# Patient Record
Sex: Male | Born: 1960 | Race: White | Hispanic: No | Marital: Married | Smoking: Former smoker
Health system: Southern US, Community
[De-identification: ages and names within clinical notes are randomized; demographics above are authoritative.]

## PROBLEM LIST (undated history)

## (undated) ENCOUNTER — Encounter

## (undated) ENCOUNTER — Ambulatory Visit

## (undated) ENCOUNTER — Ambulatory Visit: Payer: PRIVATE HEALTH INSURANCE | Attending: Rheumatology | Primary: Rheumatology

## (undated) ENCOUNTER — Telehealth

## (undated) ENCOUNTER — Ambulatory Visit: Payer: BLUE CROSS/BLUE SHIELD

## (undated) ENCOUNTER — Ambulatory Visit: Payer: PRIVATE HEALTH INSURANCE | Attending: Surgery | Primary: Surgery

## (undated) ENCOUNTER — Encounter: Attending: Infectious Disease | Primary: Infectious Disease

## (undated) ENCOUNTER — Ambulatory Visit: Payer: PRIVATE HEALTH INSURANCE

## (undated) ENCOUNTER — Encounter: Attending: Rheumatology | Primary: Rheumatology

## (undated) ENCOUNTER — Encounter
Attending: Student in an Organized Health Care Education/Training Program | Primary: Student in an Organized Health Care Education/Training Program

## (undated) ENCOUNTER — Ambulatory Visit
Attending: Student in an Organized Health Care Education/Training Program | Primary: Student in an Organized Health Care Education/Training Program

## (undated) ENCOUNTER — Telehealth: Attending: Rheumatology | Primary: Rheumatology

## (undated) ENCOUNTER — Non-Acute Institutional Stay: Payer: PRIVATE HEALTH INSURANCE

## (undated) DIAGNOSIS — R51 Headache: Secondary | ICD-10-CM

## (undated) DIAGNOSIS — IMO0002 Reserved for concepts with insufficient information to code with codable children: Secondary | ICD-10-CM

## (undated) DIAGNOSIS — M1990 Unspecified osteoarthritis, unspecified site: Secondary | ICD-10-CM

## (undated) DIAGNOSIS — K219 Gastro-esophageal reflux disease without esophagitis: Secondary | ICD-10-CM

## (undated) DIAGNOSIS — R519 Headache, unspecified: Secondary | ICD-10-CM

## (undated) DIAGNOSIS — J449 Chronic obstructive pulmonary disease, unspecified: Secondary | ICD-10-CM

## (undated) DIAGNOSIS — M797 Fibromyalgia: Secondary | ICD-10-CM

## (undated) HISTORY — PX: SPINE SURGERY: SHX786

## (undated) HISTORY — DX: Chronic obstructive pulmonary disease, unspecified: J44.9

## (undated) HISTORY — DX: Reserved for concepts with insufficient information to code with codable children: IMO0002

## (undated) MED ORDER — OMEPRAZOLE 10 MG CAPSULE,DELAYED RELEASE
Freq: Every day | ORAL | 0 days | Status: SS
Start: ? — End: 2020-06-30

## (undated) MED ORDER — OMEPRAZOLE MAGNESIUM 20 MG TABLET,DELAYED RELEASE: Freq: Every day | ORAL | 0 days | Status: SS

## (undated) MED ORDER — MULTIVITAMIN TABLET: Freq: Every day | ORAL | 0 days | Status: SS

## (undated) MED ORDER — PREGABALIN 100 MG CAPSULE: Freq: Two times a day (BID) | ORAL | 0 days | Status: SS

## (undated) MED ORDER — PREGABALIN 225 MG CAPSULE: Freq: Two times a day (BID) | ORAL | 0.00000 days | Status: SS

---
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
## 1999-02-12 HISTORY — PX: LUNG SURGERY: SHX703

## 2000-05-14 ENCOUNTER — Encounter: Payer: Self-pay | Admitting: Thoracic Surgery (Cardiothoracic Vascular Surgery)

## 2000-05-14 ENCOUNTER — Encounter (INDEPENDENT_AMBULATORY_CARE_PROVIDER_SITE_OTHER): Payer: Self-pay | Admitting: *Deleted

## 2000-05-14 ENCOUNTER — Inpatient Hospital Stay (HOSPITAL_COMMUNITY)
Admission: AD | Admit: 2000-05-14 | Discharge: 2000-05-18 | Payer: Self-pay | Admitting: Thoracic Surgery (Cardiothoracic Vascular Surgery)

## 2000-05-15 ENCOUNTER — Encounter: Payer: Self-pay | Admitting: Thoracic Surgery (Cardiothoracic Vascular Surgery)

## 2000-05-16 ENCOUNTER — Encounter: Payer: Self-pay | Admitting: Thoracic Surgery (Cardiothoracic Vascular Surgery)

## 2000-05-17 ENCOUNTER — Encounter: Payer: Self-pay | Admitting: Thoracic Surgery (Cardiothoracic Vascular Surgery)

## 2000-05-18 ENCOUNTER — Encounter: Payer: Self-pay | Admitting: Thoracic Surgery (Cardiothoracic Vascular Surgery)

## 2000-05-26 ENCOUNTER — Encounter
Admission: RE | Admit: 2000-05-26 | Discharge: 2000-05-26 | Payer: Self-pay | Admitting: Thoracic Surgery (Cardiothoracic Vascular Surgery)

## 2000-05-26 ENCOUNTER — Encounter: Payer: Self-pay | Admitting: Thoracic Surgery (Cardiothoracic Vascular Surgery)

## 2000-07-14 ENCOUNTER — Encounter: Payer: Self-pay | Admitting: Thoracic Surgery (Cardiothoracic Vascular Surgery)

## 2000-07-14 ENCOUNTER — Encounter
Admission: RE | Admit: 2000-07-14 | Discharge: 2000-07-14 | Payer: Self-pay | Admitting: Thoracic Surgery (Cardiothoracic Vascular Surgery)

## 2000-09-05 ENCOUNTER — Observation Stay (HOSPITAL_COMMUNITY): Admission: EM | Admit: 2000-09-05 | Discharge: 2000-09-06 | Payer: Self-pay | Admitting: Emergency Medicine

## 2000-09-05 ENCOUNTER — Encounter: Payer: Self-pay | Admitting: Emergency Medicine

## 2000-12-29 ENCOUNTER — Encounter
Admission: RE | Admit: 2000-12-29 | Discharge: 2000-12-29 | Payer: Self-pay | Admitting: Thoracic Surgery (Cardiothoracic Vascular Surgery)

## 2000-12-29 ENCOUNTER — Encounter: Payer: Self-pay | Admitting: Thoracic Surgery (Cardiothoracic Vascular Surgery)

## 2001-01-19 ENCOUNTER — Encounter
Admission: RE | Admit: 2001-01-19 | Discharge: 2001-01-19 | Payer: Self-pay | Admitting: Thoracic Surgery (Cardiothoracic Vascular Surgery)

## 2001-01-19 ENCOUNTER — Encounter: Payer: Self-pay | Admitting: Thoracic Surgery (Cardiothoracic Vascular Surgery)

## 2001-07-07 ENCOUNTER — Emergency Department (HOSPITAL_COMMUNITY): Admission: EM | Admit: 2001-07-07 | Discharge: 2001-07-07 | Payer: Self-pay | Admitting: Emergency Medicine

## 2001-07-07 ENCOUNTER — Encounter: Payer: Self-pay | Admitting: Emergency Medicine

## 2003-05-11 ENCOUNTER — Encounter
Admission: RE | Admit: 2003-05-11 | Discharge: 2003-08-09 | Payer: Self-pay | Admitting: Physical Medicine & Rehabilitation

## 2003-09-05 ENCOUNTER — Encounter
Admission: RE | Admit: 2003-09-05 | Discharge: 2003-12-04 | Payer: Self-pay | Admitting: Physical Medicine & Rehabilitation

## 2003-12-20 ENCOUNTER — Encounter
Admission: RE | Admit: 2003-12-20 | Discharge: 2004-03-19 | Payer: Self-pay | Admitting: Physical Medicine & Rehabilitation

## 2003-12-21 ENCOUNTER — Ambulatory Visit: Payer: Self-pay | Admitting: Physical Medicine & Rehabilitation

## 2004-04-18 ENCOUNTER — Encounter
Admission: RE | Admit: 2004-04-18 | Discharge: 2004-07-17 | Payer: Self-pay | Admitting: Physical Medicine & Rehabilitation

## 2004-04-19 ENCOUNTER — Ambulatory Visit: Payer: Self-pay | Admitting: Physical Medicine & Rehabilitation

## 2004-06-19 ENCOUNTER — Ambulatory Visit: Payer: Self-pay | Admitting: Family Medicine

## 2004-07-11 ENCOUNTER — Ambulatory Visit: Payer: Self-pay | Admitting: Family Medicine

## 2004-08-13 ENCOUNTER — Encounter
Admission: RE | Admit: 2004-08-13 | Discharge: 2004-11-11 | Payer: Self-pay | Admitting: Physical Medicine & Rehabilitation

## 2004-08-13 ENCOUNTER — Ambulatory Visit: Payer: Self-pay | Admitting: Physical Medicine & Rehabilitation

## 2004-08-17 ENCOUNTER — Ambulatory Visit (HOSPITAL_COMMUNITY)
Admission: RE | Admit: 2004-08-17 | Discharge: 2004-08-17 | Payer: Self-pay | Admitting: Physical Medicine & Rehabilitation

## 2004-09-19 ENCOUNTER — Ambulatory Visit: Payer: Self-pay | Admitting: Physical Medicine & Rehabilitation

## 2004-10-17 ENCOUNTER — Ambulatory Visit: Payer: Self-pay

## 2004-12-19 ENCOUNTER — Ambulatory Visit: Payer: Self-pay | Admitting: Physical Medicine & Rehabilitation

## 2004-12-19 ENCOUNTER — Encounter
Admission: RE | Admit: 2004-12-19 | Discharge: 2005-03-19 | Payer: Self-pay | Admitting: Physical Medicine & Rehabilitation

## 2005-03-20 ENCOUNTER — Ambulatory Visit: Payer: Self-pay | Admitting: Physical Medicine & Rehabilitation

## 2005-03-20 ENCOUNTER — Encounter
Admission: RE | Admit: 2005-03-20 | Discharge: 2005-06-18 | Payer: Self-pay | Admitting: Physical Medicine & Rehabilitation

## 2005-06-12 ENCOUNTER — Ambulatory Visit: Payer: Self-pay | Admitting: Physical Medicine & Rehabilitation

## 2005-06-12 ENCOUNTER — Encounter
Admission: RE | Admit: 2005-06-12 | Discharge: 2005-09-10 | Payer: Self-pay | Admitting: Physical Medicine & Rehabilitation

## 2005-11-13 ENCOUNTER — Encounter
Admission: RE | Admit: 2005-11-13 | Discharge: 2006-02-11 | Payer: Self-pay | Admitting: Physical Medicine & Rehabilitation

## 2005-11-13 ENCOUNTER — Ambulatory Visit: Payer: Self-pay | Admitting: Physical Medicine & Rehabilitation

## 2006-04-16 ENCOUNTER — Ambulatory Visit: Payer: Self-pay | Admitting: Physical Medicine & Rehabilitation

## 2006-04-16 ENCOUNTER — Encounter
Admission: RE | Admit: 2006-04-16 | Discharge: 2006-07-15 | Payer: Self-pay | Admitting: Physical Medicine & Rehabilitation

## 2006-10-08 ENCOUNTER — Ambulatory Visit: Payer: Self-pay | Admitting: Physical Medicine & Rehabilitation

## 2006-10-08 ENCOUNTER — Encounter
Admission: RE | Admit: 2006-10-08 | Discharge: 2006-10-09 | Payer: Self-pay | Admitting: Physical Medicine & Rehabilitation

## 2007-03-04 ENCOUNTER — Ambulatory Visit: Payer: Self-pay | Admitting: Physical Medicine & Rehabilitation

## 2007-03-04 ENCOUNTER — Encounter
Admission: RE | Admit: 2007-03-04 | Discharge: 2007-03-05 | Payer: Self-pay | Admitting: Physical Medicine & Rehabilitation

## 2007-07-28 ENCOUNTER — Encounter
Admission: RE | Admit: 2007-07-28 | Discharge: 2007-07-30 | Payer: Self-pay | Admitting: Physical Medicine & Rehabilitation

## 2007-07-30 ENCOUNTER — Ambulatory Visit: Payer: Self-pay | Admitting: Physical Medicine & Rehabilitation

## 2008-01-19 ENCOUNTER — Encounter
Admission: RE | Admit: 2008-01-19 | Discharge: 2008-01-21 | Payer: Self-pay | Admitting: Physical Medicine & Rehabilitation

## 2008-01-21 ENCOUNTER — Ambulatory Visit: Payer: Self-pay | Admitting: Physical Medicine & Rehabilitation

## 2008-06-14 ENCOUNTER — Encounter
Admission: RE | Admit: 2008-06-14 | Discharge: 2008-06-16 | Payer: Self-pay | Admitting: Physical Medicine & Rehabilitation

## 2008-06-16 ENCOUNTER — Ambulatory Visit: Payer: Self-pay | Admitting: Physical Medicine & Rehabilitation

## 2010-03-04 ENCOUNTER — Encounter: Payer: Self-pay | Admitting: Thoracic Surgery (Cardiothoracic Vascular Surgery)

## 2010-06-26 NOTE — Assessment & Plan Note (Signed)
[name] returns to the clinic today for follow up evaluation.  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  He does use his hydrocodone  approximately 3 times per day and needs a refill in the office today.   MEDICATIONS:  Hydrocodone 7.5/750 one tablet t.i.d. p.r.n. (2-3 per day)   REVIEW OF SYSTEMS:  Noncontributory.   PHYSICAL EXAMINATION:  GENERAL:  Well-appearing, fit adult male in mild  acute discomfort.  VITAL SIGNS:  Blood pressure 138/87 with a pulse 60, respiratory rate 18  and O2 saturation 99% on room air.  MUSCULOSKELETAL:  He has 5/5 strength throughout.  Bulk and tone were  normal.   IMPRESSION:  1. Chronic low back pain with lumbar spondylosis with prior      laminectomy in 1996.  2. Mild cervical spondylosis.  3. History of peptic ulcer disease.  4. History of lung nodule, stable.   In the office today we did refill the patient's hydrocodone as of  11/04/2006.  We will plan on seeing him in follow up in approximately 5-  6 months time with refills prior to that appointment as necessary.           ______________________________  Steven Whitney, M.D.     DC/MedQ  D:  10/09/2006 09:22:53  T:  10/09/2006 14:22:25  Job #:  [id number]

## 2010-06-26 NOTE — Assessment & Plan Note (Signed)
Steven Whitney returns to clinic today for followup evaluation.  He is  doing fairly well overall.  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  He  continues to use the hydrocodone approximately 2-3 tablets per day and  does need a refill over the next few days.   MEDICATIONS:  1. Hydrocodone 7.5/750 one tablet t.i.d. p.r.n. (2-3 per day)  2. Testosterone injection monthly.   REVIEW OF SYSTEMS:  Noncontributory.   PHYSICAL EXAMINATION:  GENERAL:  Well-appearing fit adult male in mild  to no acute discomfort.  VITAL SIGNS:  Blood pressure 127/82 with a pulse of 89, respiratory rate  18, and O2 saturation 98% on room air.  He ambulates without any assistive device.  Lumbar range of motion was  slightly decreased in all planes.   IMPRESSION:  1. Chronic low back pain with lumbar spondylosis with prior      laminectomy in 1996.  2. Mild cervical spondylosis.  3. History of peptic ulcer disease.  4. History of lung nodule, stable.   In the office today, we did refill the patient's hydrocodone a total of  90 as of August 19, 2007, with 2 refills.  He is very compliant with  restrictions and follows all instructions.  We will plan on seeing him  in follow up in approximately 4-6 months time with refills prior to that  appointment if necessary.           ______________________________  Jarvis Morgan, M.D.     DC/MedQ  D:  07/30/2007 09:54:51  T:  07/30/2007 21:32:16  Job #:  [id number]
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
## 2010-06-26 NOTE — Assessment & Plan Note (Signed)
Mr. Coles returns to clinic today for followup evaluation.  He  reports that he is using his hydrocodone approximately 2-3 tablets per  day.  He does need to refill over the next day or so.  He is getting  reasonable relief overall and that is his only medicine that he takes on  a regular basis.  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   MEDICATIONS:  Hydrocodone 7.5/750 one tablet t.i.d. p.r.n. (2-3 per  day).   REVIEW OF SYSTEMS:  Noncontributory.   PHYSICAL EXAMINATION:  GENERAL:  Well-appearing, fit adult male in mild-  to-no acute discomfort.  VITAL SIGNS:  Blood pressure 133/80 with pulse 71, respiratory rate 18,  and O2 saturation 98% on room air.  MUSCULOSKELETAL:  The patient ambulates without any assistive device.  Lumbar range of motion was decreased in all planes.  Bulk and tone were  normal throughout the bilateral upper and lower extremities and  sensation was intact to light touch.  Cervical range of motion was  decreased mostly in flexion and in extension.   IMPRESSION:  1. Chronic low back pain with lumbar spondylosis and prior laminectomy      in 1996.  2. Mild cervical spondylosis.  3. History of peptic ulcer disease.  4. History of lung nodule, stable.   In the office today, we did refill the patient's hydrocodone as of  January 22, 2008, with 3 refills.  He continues to be very compliant  with the use of the medication without signs of diversion or significant  side effects.  He continues to get good analgesic effect overall.  We  will plan on seeing the patient in followup in approximately 4-6 months'  time with refills prior to that appointment as necessary.           ______________________________  Jarvis Morgan, M.D.     DC/MedQ  D:  01/21/2008 10:05:55  T:  01/22/2008 01:15:27  Job #:  [id number]

## 2010-06-26 NOTE — Assessment & Plan Note (Signed)
[name] returns today for follow-up evaluation.  He reports that  overall he is doing well.  He continues to use the hydrocodone  approximately 2-3 per day.  He does need a refill on those in the office  today.  We generally give him 3 refills to make it easier on him with  his work schedule.   MEDICATIONS:  Hydrocodone 7.5/750 mg one tablet t.i.d. p.r.n.   REVIEW OF SYSTEMS:  Noncontributory.   PHYSICAL EXAM:  A well-appearing, fit adult male in mild acute  discomfort.  Blood pressure 138/91 with a pulse of 87, respiratory rate 18, and O2  saturation 98% on room air.  He has 5/5 strength throughout.  He ambulates without any assistive  device.   IMPRESSION:  1. Chronic low back pain with lumbar spondylosis with prior      laminectomy in 1996.  2. Mild cervical spondylosis.  3. History of peptic ulcer disease.  4. History of lung nodule, stable.   In the office today we did refill the patient's hydrocodone as of  March 10, 2007, with 3 refills.  We will plan on seeing the patient in  follow-up in approximately 4-5 months' time with refills prior to that  appointment as necessary.  He continues to get good analgesic effect  with no signs of diversion and no significant side effects.           ______________________________  Steven Whitney, M.D.     DC/MedQ  D:  03/05/2007 10:57:55  T:  03/05/2007 11:16:13  Job #:  [id number]

## 2010-06-29 NOTE — Assessment & Plan Note (Signed)
[name] returns to clinic today for followup evaluation.  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  That is a part time job for him.  His wife is about to lose her  job and unfortunately he will be without health insurance.  Her job is  apparently going overseas from the [employer].   MEDICATIONS:  Hydrocodone 7.5/750 1 tablet t.i.d. p.r.n.   REVIEW OF SYSTEMS:  Noncontributory.   PHYSICAL EXAMINATION:  GENERAL:  A well-appearing fit adult male in mild  to no acute discomfort.  VITAL SIGNS:  Blood pressure 129/75 with a pulse of 81, respiratory rate  16, O2 saturation 97% on room air.  EXTREMITIES:  He has 5/5 strength throughout.  Bulk and tone were  normal.   IMPRESSION:  1. Chronic low back pain with lumbar spondylosis with prior      laminectomy in 1996.  2. Mild cervical spondylosis.  3. History of peptic ulcer disease.  4. History of lung nodule, stable.   In the office today, we did refill the patient's hydrocodone, total of  90 with 2 refills.  He continues to be very compliant with prescription  use and has no significant adverse side effects.  Will plan on seeing  him in followup in approximately 5-6 months time with refills prior to  that appointment if necessary.           ______________________________  Steven Whitney, M.D.     DC/MedQ  D:  04/17/2006 09:38:05  T:  04/17/2006 10:04:03  Job #:  [id number]

## 2010-06-29 NOTE — Op Note (Signed)
[facility]  Patient:    Steven Whitney, Steven Whitney                     MRN: [id number] Proc. Date: 05/14/00 Attending:  Valentina Gu. Roxy Manns, M.D. CC:         Dr. Elesa Hacker, [place]  [facility]   Operative Report  PREOPERATIVE DIAGNOSIS:  Recurrent right spontaneous pneumothorax.  POSTOPERATIVE DIAGNOSIS:  Recurrent right spontaneous pneumothorax.  PROCEDURES:  Right video-assisted thoracic surgery (VATS) for apical bleb resection, pleurectomy, and pleurodesis.  SURGEON:  Valentina Gu. Roxy Manns, M.D.  ASSISTANT:  Earnstine Regal, P.A.  ANESTHESIA:  General.  BRIEF CLINICAL NOTE:  Patient is a 50 year old previously healthy white male from [place], who recently presented to [facility] with a right spontaneous pneumothorax.  This was treated with chest tube placement successfully with good re-expansion of the lung.  The patient was discharged home after successful removal of the chest tube.  Several days later, the patient returned with another recurrent right spontaneous pneumothorax.  A chest tube was placed and the lung re-expanded.  The patient was transferred to [facility] for further management.  OPERATIVE CONSENT:  The patient is counseled at length regarding the indications and the potential benefits of videoscopic surgery for recurrent pneumothorax.  Alternative strategies are discussed, including the possibility of continued conservative observation with an attempt at chest tube removal without need for surgery.  The risk of recurrence was discussed as an indication for intervening surgically at this time.  The patient accepts all associated risks of surgery, including but not limited to risk of death, bleeding requiring blood transfusion, arrhythmia, pneumonia, prolonged air leak requiring prolonged chest tube drainage, and prolonged chest wall discomfort.  He accepts these risks  as well as any unforeseen complications and desires to proceed as described.  DESCRIPTION OF PROCEDURE:  The patient is brought to the operating room on the above-mentioned date and placed in the supine position on the operating room table.  Intravenous antibiotics are administered.  A radial arterial line and central venous catheter are placed for monitoring purposes.  Sequential pneumatic compression boots are placed on both lower extremities to prevent deep vein thrombosis.  General endotracheal anesthesia is induced uneventfully, and a dual-lumen endotracheal tube is placed.  The position is verified under the care and direction of David A. Al Corpus, M.D.  The patient is turned to the left lateral decubitus position, and his right chest is prepared and draped in a sterile manner.  The right lung is allowed to deflate using single-lung anesthesia.  A small incision is made in the anterior axillary line overlying approximately the seventh intercostal space.  The intercostal muscles and subcutaneous tissues are divided with electrocautery.  The right pleural space is entered bluntly and carefully.  There appear to be no adhesions between the parietal and pleural surfaces.  The trocar is passed through the incision, and a 30 degree videoscopic camera is passed through the port.  The right chest is explored visually.  The right lung appears normal.  There are no adhesions between the pleural surfaces, and the right lung is completely flat.  Two additional ports are placed, with one located posteriorly through approximately the fifth intercostal space and a third located anteriorly through approximately the fourth intercostal space.  The apex of the right lung is mobilized, and the entire right chest is explored.  No obvious abnormalities in the lung parenchyma are noted, although  there is suggestion of a subpleural bleb near the apex of the right upper lobe.  Wedge resection of the apex of  the right upper lobe is performed using several fires of the endoscopic stapling device with Gore-Tex Seam-Guard covers to secure staple line integrity.  After complete removal of the specimen, the specimen is examined and subsequently sent for routine pathologic evaluation.  The remainder of the right lung is completely examined.  Specifically, the apical portion of the superior segment of the right lower lobe is also examined.  There is no sign of any blebs in this region.  No other resections are performed.  Pleurectomy is performed using blunt dissection to remove the parietal pleura circumferentially around the entire right hemithorax.  This is performed easily and uneventfully.  The parietal surface of the right hemidiaphragm is then abraded using the cautery scratch pad to abrade the surface.  The right chest is inspected for hemostasis.  The right chest is drained with a single 28 French chest tube placed through the original middle portion, _____ inferiorly.  The chest tube is secured to the skin with several sutures.  The videoscopic camera is passed through another port while the right lung is re-expanded.  No obvious leaks or other abnormalities are noted.  The videoscopic camera is removed uneventfully.  The two remaining port incisions are closed in multiple layers.  The chest tube is affixed to a closed-suction drainage device.  The patient tolerated the procedure well, is extubated in the operating room, and transported to the recovery room in stable condition.  There are no intraoperative complications. D:  05/14/00 TD:  05/14/00 Job: [id number]

## 2010-06-29 NOTE — Assessment & Plan Note (Signed)
Mr. Ende returns to clinic today for followup evaluation. He reports  that he is doing well, at least stable in terms of his back pain. He  continues to use his hydrocodone 7.5/750 one half tablet to one tablet  b.i.d.   He has developed a lot of pain of his mouth and that has been treated by a  dentist in [place]. Apparently, they pulled at least two teeth, but  he still has pain. He was subsequently referred to Dr. Erling Cruz, local  neurologist. Dr. Erling Cruz started the patient on Neurontin, and the patient  reports that he is getting relief with that medication. He is due to follow  up with Dr. Erling Cruz in approximately six weeks' time. The patient did not gain  relief from the Neurontin for his back pain in the past, but it is helping  his mouth pain at the present time.   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   MEDICATIONS:  1. Hydrocodone 7.5/750 one half to one tablet p.o. b.i.d.  2. Neurontin 500 mg p.o. b.i.d.   PHYSICAL EXAMINATION:  Well appearing adult male with blood pressure 131/85,  pulse 71, and O2 saturation 99% on room air. He has strength of 5/5  throughout the bilateral upper and lower extremities. Bulk and tone were  normal, and reflexes were 2+ and symmetrical.   IMPRESSION:  1. Chronic low back pain.  2. Lumbar spondylosis with history of prior laminectomy in 1996.  3. Mild cervical spondylosis.  4. Status post thoracoscopy for spontaneous pneumothorax with persistent     pain, resolved.  5. History of peptic ulcer disease per patient.   At the present time, the patient has sufficient hydrocodone. His last refill  was apparently April 25, 2003. We will plan on giving him refills and see  him in followup in approximately four months' time.      Jarvis Morgan, M.D.   DC/MedQ  D:  05/13/2003 11:21:33  T:  05/13/2003  15:30:12  Job #:  [id number]

## 2010-06-29 NOTE — Assessment & Plan Note (Signed)
MEDICAL RECORD NUMBER:  [id number].   Mr. Trageser returns to clinic today for followup evaluation. He is having  some increased pain especially with colder weather. Overall, he gets good  relief from his hydrocodone used three times per day. He does need a refill  on that in the office today. He has tried the Flexeril but reports that he  did not get much relief, and he felt too sleep with the medication. He is  wondering if any other muscle relaxing medications are available. He still  needs to have his followup CAT scan of his chest to evaluate his lung  nodule, and that is probably due at this time as he reports he was told to  have one every three to six months.   MEDICATIONS:  Hydrocodone 7.5/750 one tablet t.i.d. p.r.n.   REVIEW OF SYSTEMS:  Noncontributory.   PHYSICAL EXAMINATION:  GENERAL:  Well-appearing fit adult male in mild to no  acute discomfort.  VITAL SIGNS:  Blood pressure 133/80 with pulse of 97, respiratory rate 16  and O2 saturation 99% on room air.   He has 5-/5 strength throughout the bilateral upper and lower extremities.  Bulk and tone were normal, and reflexes were 2+ and symmetrical.   IMPRESSION:  1.  Chronic low back pain.  2.  Lumbar spondylosis with prior laminectomy in 1996.  3.  Mild cervical spondylosis.  4.  History of peptic ulcer disease.  5.  History of lung nodule, stable.   In the office today, we did give the patient prescription for the  hydrocodone 7.5/750 one tablet t.i.d. p.r.n. with one refill. We also gave  him a new prescription for Soma at 350 mg 1 tablet p.o. b.i.d., a total of  only 20 so that he can try that out. If that gives him relief, then he call  in for a refill. He will be stopping the Flexeril medication completely and  will not refill that for him.   We will plan on seeing him in followup in approximately three to four  months' time.           ______________________________  Jarvis Morgan, M.D.     DC/MedQ  D:  03/21/2005 09:53:19  T:  03/21/2005 10:34:34  Job #:  [id number]

## 2010-06-29 NOTE — Assessment & Plan Note (Signed)
Mr. Pirro returns today for followup evaluation. He reports that overall  he is doing reasonably well.  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  He is using hydrocodone  approximately 3 times per day.  He has restarted Flexeril medication which  he had used previously and that had helped him with his sleep.   MEDICATIONS:  1. Hydrocodone 7.5/750 one tablet t.i.d. as needed.  2. Ciprofloxacin periodically for urinary tract infection.   REVIEW OF SYSTEMS:  Noncontributory.   PHYSICAL EXAMINATION:  A well-appearing fit adult male in mild to no acute  discomfort.  Blood pressure 123/77 with a pulse of 72.  Respiration 17 and O2 saturation  100% on room air.  He is 5/5 strength in bilateral and upper and lower extremities.  Bulk and  tone were normal.  Reflexes were 2+ and symmetrical.  Sensation was intact  to light touch throughout.   IMPRESSION:  1. Chronic low back pain.  2. Lumbar spondylosis with prior laminectomy in 1996.  3. Mild cervical spondylosis.  4. History of peptic ulcer disease.  5. History of lung nodule, stable.   In the office today we did not need to refill his hydrocodone as that was  just refilled in mid September.  We did give him a new script for Flexeril  10 mg 1 tablet p.o. nightly.  We will plan on seeing him in followup in  approximately 5 months time with refills prior to that appointment as  necessary.           ______________________________  Jarvis Morgan, M.D.     DC/MedQ  D:  11/14/2005 09:27:22  T:  11/15/2005 13:13:04  Job #:  [id number]

## 2010-06-29 NOTE — Assessment & Plan Note (Signed)
MEDICAL RECORD NUMBER:  [id number].   [name] returns to clinic today for followup evaluation. I last saw him  August 16, 2004. He had reported that a CAT scan of his abdomen was done Jun 19, 2004 showed acute diverticulitis or an abscess. There was also some  lucencies in the vertebral bodies and the sacrum. The radiologist could not  exclude the possibility of an underlying process and recommended a bone  scan. No bone scan was ever completed. We did send him for a bone scan when  I saw him July 6, and that was done August 17, 2004. The impression on the bone  scan was totally negative.   The patient is due to follow up with his surgeon, Dr. Roxan Hockey. A  followup CAT scan is scheduled next week.   The patient reports that he gets good relief with hydrocodone 7.5/750 one  tablet t.i.d. p.r.n. He generally uses two per day, but I have encouraged  him to use three as needed.   MEDICATIONS:  1.  Hydrocodone 7.5/750 one tablet t.i.d. p.r.n.  2.  Advair Diskus p.r.n. b.i.d.   PHYSICAL EXAMINATION:  Well-appearing, fit, adult male in mid acute  discomfort. Blood pressure is 132/84 with a pulse of 74, respiratory rate  16, and O2 saturation 99% on room air. He has 5/5 strength throughout the  bilateral upper and lower extremities. Bulk and tone were normal, and  reflexes were 2+ and symmetrical. Sensation was intact to light touch  throughout. He ambulates without assistive device.   IMPRESSION:  1.  Chronic low back pain.  2.  Lumbar spondylosis with prior laminectomy in 1996.  3.  Mild cervical spondylosis.  4.  History of peptic ulcer disease.   In the office today, we did the patient a copy of a bone scan for referral  back to his surgeon. We have also refilled his hydrocodone as of today. I  have encouraged him to use it three times a day as needed. He tends to be  reluctant to use it more than twice a day. We will plan on seeing him in  followup in approximately three months'  time.       DC/MedQ  D:  09/21/2004 09:35:52  T:  09/21/2004 10:13:42  Job #:  [id number]

## 2010-06-29 NOTE — Assessment & Plan Note (Signed)
_____  Steven Whitney returns to the clinic today for follow-up evaluation.  He is  doing reasonably well overall on his hydrocodone used 3 times a day as  needed.  He just had a refill on May 29, 2005.  He reports that he has  been seeing a urologist for an enlarged prostate and is on an antibiotic,  along with UroXatral.  He is experiencing some sleepiness related to the  UroXatral.   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   MEDICATIONS:  1.  Hydrocodone 7.5/750, 1 tablet t.i.d. p.r.n.  2.  UroXatral daily.  3.  Ciprofloxacin daily.   REVIEW OF SYSTEMS:  Noncontributory.   PHYSICAL EXAMINATION:  GENERAL:  Well-appearing, thin adult male in mild to  no acute discomfort.  VITAL SIGNS:  Blood pressure 123/77 with a pulse of 69, respiratory rate 16,  and O2 saturation 99% on room air.  EXTREMITIES:  He has 5-/5 strength throughout the bilateral upper and lower  extremities.  Reflexes are 2+ and symmetrical, and sensation is intact to  light touch throughout.  He ambulates without any assistive device.   IMPRESSION:  1.  Chronic low back pain.  2.  Lumbar spondylosis with prior laminectomy in 1996.  3.  Mild cervical spondylosis.  4.  History of peptic ulcer disease.  5.  History of lung nodule, stable.   In the office today, no refill on medications is necessary.  He is very  compliant with medicines and very stable.  We will plan on seeing him in  followup in approximately 6 months' time with refills prior to that  appointment if necessary.           ______________________________  Steven Whitney, M.D.     DC/MedQ  D:  06/13/2005 09:27:00  T:  06/13/2005 19:25:42  Job #:  [id number]

## 2010-06-29 NOTE — Assessment & Plan Note (Signed)
MEDICAL RECORD NUMBER:  [id number].   Mr. Querry returns to clinic today for followup evaluation. He reports  that overall he is doing about the same. He is sure that he is not any  better but feels that he is not significantly worse also. He does take his  hydrocodone 7.5/750 one tablet 3 times per day as needed. He does need a  refill in the office today.   The patient did have a followup CAT scan for chest discomfort. He was told  that he has a stable lung nodule, and they are planning to repeat CAT scans  every three to six months. He was also discussing his chest pain with his  physician. That physician did not feel that his tightness that he has in his  chest was secondary to the nodule. They suggest a possible muscle relaxing  medication.   MEDICATIONS:  1.  Hydrocodone 7.5/750 one tablet t.i.d. p.r.n.  2.  Advair Diskus p.r.n.   PHYSICAL EXAMINATION:  Well appearing, fit, adult male in mild to no acute  distress. Blood pressure 137/80 with pulse of 74, respiratory rate 16, and  O2 saturation 99% on room air. He has 5-/5 to 5/5 strength throughout the  bilateral upper and lower extremities. Bulk and tone were normal, and  reflexes were 2+ and symmetrical. He ambulates without any assistive device.  Lumbar range of motion was decreased in all planes.   IMPRESSION:  1.  Chronic low back pain.  2.  Lumbar spondylosis with prior laminectomy in 1996.  3.  Mild cervical spondylosis.  4.  Chest tightness, noncardiac.  5.  History of peptic ulcer disease.   In the office today, we did refill the patient's extra strength 1 tablet  p.o. t.i.d. p.r.n. total of 90. We also gave him a new prescription for  Flexeril 10 mg to be used 1/2 tablet to 1 tablet p.o. t.i.d. p.r.n. for his  chest tightness. We will plan on seeing the patient in followup in  approximately three months' time with refill of medications prior to that  appointment as necessary.     ______________________________  Jarvis Morgan, M.D.     DC/MedQ  D:  12/20/2004 09:36:41  T:  12/20/2004 09:55:43  Job #:  [id number]

## 2010-06-29 NOTE — Discharge Summary (Signed)
[place]  Patient:    Steven Whitney, Steven Whitney                     MRN: [id number] Adm. Date:  [id number] Disc. Date: 05/18/00 Attending:  Darylene Price Dictator:   Marcellus Scott, P.A. CC:         [facility]  Dr. Ananias Pilgrim of [facility]   Discharge Summary  DATE OF BIRTH:  [birth year]  PRIMARY CARE PROVIDERS:  [facility].  REFERRING PHYSICIAN:  Dr. Ananias Pilgrim, [place].  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  SECONDARY DIAGNOSES: 1. History of peptic ulcer. 2. 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 3. Status post laminectomy 1996.  PROCEDURE:  May 14, 2000 - right video-assisted thoracostomy, apical bleb resection, pleurectomy and pleurodesis by Dr. Darylene Price.  DISCHARGE DISPOSITION:  Steven Whitney is ready for discharge postoperative day #4.  He has been afebrile in the postoperative period.  His chest tube which was placed intraoperatively has not had a leak at any time. Chest x-ray shows the lung was fully re-expanded after surgery.  He has been off of supplemental oxygen since postoperative day #2.  His chest tube was placed off suction on the evening of postoperative day #2, and a chest x-ray on the morning of postoperative day #3 showed full expansion of the lung. Chest tube drainage had started to decline by postoperative day #3 and the chest tube was pulled during postoperative day #3 without incident.  Follow-up chest x-ray on the morning of postoperative day #4 showed no evidence of pneumothorax.  The right lung was fully expanded.  The lung fields were clear bilaterally.  The patients incisions are healing well.  He has had no cardiac arrhythmias, no respiratory distress.  He is ambulating.  His mental status has been clear.  He goes home on the following medications.  DISCHARGE MEDICATIONS: 1. Vicodin 5/500 1-2 tablets p.o.  q.4-6h. p.r.n. pain. 2. Pepcid 20 mg b.i.d. 3. Vioxx 20 mg daily.  DISCHARGE ACTIVITY:  Ambulation as much as possible.  He is asked not to drive for the next week.  DISCHARGE DIET:  Regular diet.  WOUND CARE:  He may shower daily, keeping his incision clean and dry.  FOLLOW-UP:  He will see Dr. Roxy Manns and Dr. Ricard Dillon office will call to arrange that appointment.  He is asked to get a chest x-ray one hour before the visit with Dr. Roxy Manns.  BRIEF HISTORY:  Steven Whitney is a 50 year old male who began feeling poorly on Sunday, March 24.  He began having right-sided chest pain.  At the same time, he noted progressive trouble breathing.  He was unable to get an effective cough although he felt as if he had to bring up some sputum.  He presented to a local acute care where a chest x-ray showed spontaneous right pneumothorax. He was admitted to [facility] where a right thoracostomy tube was placed.  It was removed after three days when the chest x-ray off suction showed re-expansion of the right lung.  He was discharged and did well until Friday, March 29 when his symptoms recurred.  He did not return to the hospital until the evening of Saturday, March 30, when he had become very short of breath.  The chest tube was reinserted and the tube left off suction the evening of March 31.  The chest x-ray Monday morning showed further collapse of the  lung off suction.  He was maintained on suction for the next two days and placed off suction that evening.  Chest x-ray on the morning of Wednesday, April 3 showed 10-15% right apical pneumothorax with lung fields otherwise clear.  Due to the recurrence of a right spontaneous pneumothorax he himself requested transfer to [facility] where he had undergone previous surgery for opinion of a thoracic surgeon.  He was seen by Dr. Roxy Manns who recommended either following the pneumothorax with continuing chest tube on suction or a right  video-assisted thoracoscopy with possible bleb stapling and pleurodesis.  The patient refused a chest x-ray of the morning of April 3 with showing of continued right apical pneumothorax and decided for definitive surgical treatment.  HOSPITAL COURSE:  After admission to [facility] April 3, he underwent right video-assisted thoracostomy, apical bleb resection, pleurectomy, and pleurodesis by Dr. Darylene Price.  His postoperative course has been uncomplicated and has lasted four days.  Final chest x-ray shows full re-expansion of the right lung with lung fields clear.  His pain is controlled with Vicodin.  He goes home with Vicodin and Vioxx for pain control.  He will see Dr. Roxy Manns in follow-up in two weeks. DD:  05/17/00 TD:  05/17/00 Job: [id number]

## 2010-06-29 NOTE — Assessment & Plan Note (Signed)
MEDICAL RECORD NUMBER:  [id number].   [name] returns to clinic today for followup evaluation. He reports  that overall he is doing about the same. He reports good and bad days and  generally takes the hydrocodone 7.5/750 approximately two tablets per day.  He still has two or three remaining from a prescription filled February 22, 2004 for a total of 90. 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 He also raises hay for the  animals. Overall, he is reporting about the same amount of benefit from the  pain medications.   MEDICATIONS:  Hydrocodone 7.5/750 one to two tablets q.d. p.r.n.   PHYSICAL EXAMINATION:  Well appearing, fit, adult male in mild acute  discomfort. Blood pressure 128/89 with a pulse of 81, respiratory rate 16,  and O2 saturation 99% on room air. He has 5/5 strength throughout the  bilateral lower extremities. Bulk and tone were normal. Reflexes were 2= and  symmetrical, and he ambulates without any assistive device. Lumbar range of  motion was within functioning limits in all plans.   IMPRESSION:  1.  Chronic low back pain.  2.  Lumbar spondylosis with prior laminectomy in 1996.  3.  Mild cervical spondylosis.  4.  History of peptic ulcer disease.   In the office today, we did refill his hydrocodone 7.5/750 one tablet p.o.  t.i.d. p.r.n. He continues to be very compliant with medications and has  been commended for that. We will plan on seeing him in followup in  approximately three to four months' time.      DC/MedQ  D:  04/19/2004 11:33:35  T:  04/19/2004 11:49:55  Job #:  [id number]

## 2010-06-29 NOTE — Assessment & Plan Note (Signed)
[name] returns to the clinic today for follow-up evaluation.  He  reports that he has had some left lower abdomen pain recently.  He  apparently saw his primary care physician and subsequently had both a CAT  scan of his abdomen along with a CAT scan of his chest done on Jul 11, 2004.  He subsequently was referred on to [place]. Roxan Hockey, M.D., a local CVTS  surgeon.  The official results of the chest CT were shotty lymph nodes in  the upper mediastinum with no enlarged lymph nodes seen.  There was linear  scarring in the right apex as well as along the subpleural region in the  right lateral lung field.  Posterior, this appeared to be more linear and  nodular and could possibly represent scarring from prior chest tube  insertion and pneumothorax.  There was a tiny nodule noted in the left lung  base and that did not appear to be calcified.  The area appeared to be a  noncalcified granuloma versus tiny bronchogenic carcinoma for which a follow-  up CT in 6 months was recommended.  Dr. Roxan Hockey apparently had seen him  and recommended follow-up chest CT in 3 months' time along with referral  back to this office for chronic pain management.   The patient also underwent CAT scan of his abdomen on Jun 19, 2004.  The  impression on that study was diverticulosis in the sigmoid colon without  evidence of acute diverticulitis or abscess.  There is no suspicious solid  organ abnormality.  There was some subtle lucencies seen in multiple  vertebral bodies and sacrum.  The radiologist could not exclude the  possibility of an underlying process and recommended a bone scan for further  evaluation.  There were sub centimeter pleural-based nodules seen in both  lung bases and radiologist could not exclude the possibility of an  underlying sinister process with follow-up recommended CT in several months'  time.   I was brought in the scans of these studies, but subsequently had the  results  sent to me.  Unfortunately, the patient was out of the office at  that point.  I will be contacting the patient today to set up a bone scan to  evaluate the abnormalities of his vertebral bodies seen on the CAT scan of  his abdomen.  Apparently, his lung problems are still going to be monitored  by Dr. Roxan Hockey with follow-up CT in approximately 3 months' time.   The patient continues to take hydrocodone 7.5/750 one tablet t.i.d. p.r.n.  He is getting reasonable relief with that at the present time.   PHYSICAL EXAMINATION:  GENERAL:  A well-appearing, fit, adult male in mild  acute discomfort.  VITAL SIGNS:  Blood pressure 124/86 with a pulse of 70, respiratory rate 16,  and O2 saturation 99% on room air.  He has 5/5 strength throughout the  bilateral upper and lower extremities.  Bulk and tone were normal and  reflexes were 2+ and symmetrical.  Sensation was intact to light touch  throughout.   IMPRESSION:  1.  Chronic low back pain.  2.  Lumbar spondylosis and prior laminectomy in 1996.  3.  Mild cervical spondylosis.  4.  History of peptic ulcer disease.   At this point, we will contact the patient and set him up for a bone scan to  evaluate the abnormalities seen in multiple vertebral bodies and his sacrum.  The possibility of a lung cancer was raised by  Dr. Roxan Hockey, but there is  also a possibility that this could be left over scarring present from his  prior chest tube placed in 2002 for a pneumothorax.  In any event, we will  see what the bone scan looks like and see what further diagnostic studies of  his chest nodule show up.  In the meantime, no refill on his hydrocodone is  necessary.  We will plan on seeing him in  follow-up in approximately 1 months' time to go over the results of the bone  scan that we will be ordering as soon as possible.       DC/MedQ  D:  08/16/2004 10:09:46  T:  08/16/2004 11:01:29  Job #:  [id number]

## 2010-06-29 NOTE — Assessment & Plan Note (Signed)
Steven Whitney returns to the clinic today for followup evaluation.  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  He is using his hydrocodone 7.5/750 approximately two  tablets per day.  He is very compliant with that and does not use excessive  amounts.  He reportedly has had a bad reaction to Trileptal medication in  the past, and he is very cautious with all medicines.   MEDICATIONS:  Hydrocodone 7.5/750, one half to one tablet p.o. b.i.d. p.r.n.   PHYSICAL EXAMINATION:  GENERAL:  A well-appearing, fit, adult male in no  acute discomfort.  VITAL SIGNS:  Blood pressure 143/77 with a pulse of 84, respiratory rate 20,  and O2 saturation 99% on room air.  MUSCULOSKELETAL:  He has 5/5 strength throughout the bilateral upper and  lower extremities.  Bulk and tone were normal.  Reflexes were 2+ and  symmetric.   IMPRESSION:  1.  Chronic low back pain.  2.  Lumbar spondylosis with a history of prior laminectomy in 1996.  3.  Mild cervical spondylosis.  4.  History of peptic ulcer disease, per patient.   In the clinic today, I did refill his hydrocodone 7.5/750.  I have allowed  him to take one tablet three times a day as that will cause him less  problems with the pharmacy.  He generally only uses 1-2 tablets per day but  they have only allowed him 60 tablets based on a twice a day dosing.  On a  three time a day dosing schedule, he will be allowed 90, and I have written  for the 90 tablets.  We will plan on seeing him in followup in approximately  four months' time.       DC/MedQ  D:  12/21/2003 14:44:19  T:  12/21/2003 18:05:43  Job #:  [id number]

## 2010-06-29 NOTE — Assessment & Plan Note (Signed)
MEDICAL RECORD NUMBER:  [id number].   [name] returns to clinic today for followup evaluation. He reports  that he continues to get good relief from the hydrocodone 7.5/750 used up to  2 tablets per day. He generally uses 1 at a time. He has been seen by Dr.  Erling Cruz, local neurologist and was initially placed on Neurontin and then  Trileptal for facial pain on the left side. The patient reports that he was  unable to tolerate either of those medications and that he discontinued both  of them. He is now using only the hydrocodone medication with reasonably  good relief. He was last refilled on that yesterday by phone.   MEDICATIONS:  Hydrocodone 7.5/750 one half to one tablet p.o. b.i.d.   PHYSICAL EXAMINATION:  Well-appearing, fit, adult male in mild acute  discomfort. Vitals were not obtained. Strength was 5/5 throughout the  bilateral upper and lower extremities. Reflexes were 2+ and symmetrical.   IMPRESSION:  1. Chronic low back pain.  2. Lumbar spondylosis with a history of prior laminectomy in 1996.  3. Mild cervical spondylosis.  4. History of peptic ulcer disease per patient.   At the present time, we recently refilled the hydrocodone as of yesterday.  Will plan on seeing him in follow up in approximately three to four months  with refills prior to that appointment.      Steven Whitney, M.D.   DC/MedQ  D:  09/23/2003 12:57:13  T:  09/24/2003 06:37:03  Job #:  [id number]

## 2012-04-01 ENCOUNTER — Other Ambulatory Visit (HOSPITAL_COMMUNITY): Payer: Self-pay | Admitting: Neurosurgery

## 2012-04-01 DIAGNOSIS — M5412 Radiculopathy, cervical region: Secondary | ICD-10-CM

## 2012-04-01 DIAGNOSIS — M542 Cervicalgia: Secondary | ICD-10-CM

## 2012-04-08 ENCOUNTER — Ambulatory Visit (HOSPITAL_COMMUNITY)
Admission: RE | Admit: 2012-04-08 | Discharge: 2012-04-08 | Disposition: A | Discharge status: Home / Self Care | Payer: 59 | Source: Ambulatory Visit | Attending: Neurosurgery | Admitting: Neurosurgery

## 2012-04-08 DIAGNOSIS — M5412 Radiculopathy, cervical region: Secondary | ICD-10-CM

## 2012-04-08 DIAGNOSIS — R51 Headache: Secondary | ICD-10-CM | POA: Insufficient documentation

## 2012-04-08 DIAGNOSIS — M79609 Pain in unspecified limb: Secondary | ICD-10-CM | POA: Insufficient documentation

## 2012-04-08 DIAGNOSIS — M542 Cervicalgia: Secondary | ICD-10-CM | POA: Insufficient documentation

## 2012-04-08 DIAGNOSIS — M4802 Spinal stenosis, cervical region: Secondary | ICD-10-CM | POA: Insufficient documentation

## 2013-06-28 ENCOUNTER — Ambulatory Visit (INDEPENDENT_AMBULATORY_CARE_PROVIDER_SITE_OTHER): Payer: 59

## 2013-06-28 ENCOUNTER — Ambulatory Visit (INDEPENDENT_AMBULATORY_CARE_PROVIDER_SITE_OTHER): Payer: 59 | Admitting: Orthopedic Surgery

## 2013-06-28 ENCOUNTER — Encounter: Payer: Self-pay | Admitting: Orthopedic Surgery

## 2013-06-28 VITALS — HR 72 | Resp 16 | Ht 71.0 in

## 2013-06-28 DIAGNOSIS — M67919 Unspecified disorder of synovium and tendon, unspecified shoulder: Secondary | ICD-10-CM

## 2013-06-28 DIAGNOSIS — M719 Bursopathy, unspecified: Principal | ICD-10-CM

## 2013-06-28 MED ORDER — IBUPROFEN 800 MG PO TABS: 800.0000 mg | ORAL_TABLET | Freq: Three times a day (TID) | ORAL | Status: DC | PRN

## 2013-06-28 MED ORDER — ACETAMINOPHEN-CODEINE #3 300-30 MG PO TABS: 1.0000 | ORAL_TABLET | ORAL | Status: DC | PRN

## 2013-06-28 NOTE — Progress Notes (Signed)
Patient ID: Steven Whitney, male   DOB: 09-21-1960, 53 y.o.   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 Pain then moved to his left shoulder. Treatment ibuprofen   His range of motion has maintained normal function. He has some pain with overhead activity. His neck symptoms are brought in him as well with bilateral posterior shoulder pain and trapezoid pain with some cervical spine pain and burning sensation radiating towards both shoulders.    General the patient is well-developed and well-nourished grooming and hygiene are normal Oriented x3 Mood and affect normal Ambulation normal Inspection of the right and left shoulder exhibit:  Full range of motion All joints are stable Motor exam is normal Skin clean dry and intact There is clicking and popping of forward elevation of the right shoulder with a positive Neer sign for impingement negative on the left. Apprehension tests are negative   Cardiovascular exam is normal Sensory exam normal  Xray rt shoulder shows normal glenohumeral joint without osteophyte type II acromion  Encounter Diagnosis  Name Primary?  . Disorders of bursae and tendons in shoulder region, unspecified Yes   Meds ordered this encounter  Medications  . ibuprofen (ADVIL,MOTRIN) 800 MG tablet    Sig: Take 1 tablet (800 mg total) by mouth every 8 (eight) hours as needed.    Dispense:  90 tablet    Refill:  5  . acetaminophen-codeine (TYLENOL #3) 300-30 MG per tablet    Sig: Take 1 tablet by mouth every 4 (four) hours as needed for moderate pain.    Dispense:  42 tablet    Refill:  5   Procedure inject subacromial space right shoulder Diagnosis rotator cuff syndrome right shoulder Medication Depo-Medrol 40 mg, 1 cc  and lidocaine 1% 3 cc Verbal consent Timeout completed  The injection site was cleaned with alcohol and sprayed with ethyl chloride. From a posterior approach a 20-gauge needle was injected in the subacromial space. The medication went in easily. There were no complications. The wound was covered with a sterile bandage. Appropriate precautions were given.

## 2013-06-28 NOTE — Patient Instructions (Signed)
Encounter Diagnosis  Name Primary?  . Disorders of bursae and tendons in shoulder region, unspecified Yes  You have received a steroid shot. 15% of patients experience increased pain at the injection site with in the next 24 hours. This is best treated with ice and tylenol extra strength 2 tabs every 8 hours. If you are still having pain please call the office.    Impingement Syndrome, Rotator Cuff, Bursitis with Rehab Impingement syndrome is a condition that involves inflammation of the tendons of the rotator cuff and the subacromial bursa, that causes pain in the shoulder. The rotator cuff consists of four tendons and muscles that control much of the shoulder and upper arm function. The subacromial bursa is a fluid filled sac that helps reduce friction between the rotator cuff and one of the bones of the shoulder (acromion). Impingement syndrome is usually an overuse injury that causes swelling of the bursa (bursitis), swelling of the tendon (tendonitis), and/or a tear of the tendon (strain). Strains are classified into three categories. Grade 1 strains cause pain, but the tendon is not lengthened. Grade 2 strains include a lengthened ligament, due to the ligament being stretched or partially ruptured. With grade 2 strains there is still function, although the function may be decreased. Grade 3 strains include a complete tear of the tendon or muscle, and function is usually impaired. SYMPTOMS   Pain around the shoulder, often at the outer portion of the upper arm.  Pain that gets worse with shoulder function, especially when reaching overhead or lifting.  Sometimes, aching when not using the arm.  Pain that wakes you up at night.  Sometimes, tenderness, swelling, warmth, or redness over the affected area.  Loss of strength.  Limited motion of the shoulder, especially reaching behind the back (to the back pocket or to unhook bra) or across your body.  Crackling sound (crepitation) when  moving the arm.  Biceps tendon pain and inflammation (in the front of the shoulder). Worse when bending the elbow or lifting. CAUSES  Impingement syndrome is often an overuse injury, in which chronic (repetitive) motions cause the tendons or bursa to become inflamed. A strain occurs when a force is paced on the tendon or muscle that is greater than it can withstand. Common mechanisms of injury include: Stress from sudden increase in duration, frequency, or intensity of training.  Direct hit (trauma) to the shoulder.  Aging, erosion of the tendon with normal use.  Bony bump on shoulder (acromial spur). RISK INCREASES WITH:  Contact sports (football, wrestling, boxing).  Throwing sports (baseball, tennis, volleyball).  Weightlifting and bodybuilding.  Heavy labor.  Previous injury to the rotator cuff, including impingement.  Poor shoulder strength and flexibility.  Failure to warm up properly before activity.  Inadequate protective equipment.  Old age.  Bony bump on shoulder (acromial spur). PREVENTION   Warm up and stretch properly before activity.  Allow for adequate recovery between workouts.  Maintain physical fitness:  Strength, flexibility, and endurance.  Cardiovascular fitness.  Learn and use proper exercise technique. PROGNOSIS  If treated properly, impingement syndrome usually goes away within 6 weeks. Sometimes surgery is required.  RELATED COMPLICATIONS   Longer healing time if not properly treated, or if not given enough time to heal.  Recurring symptoms, that result in a chronic condition.  Shoulder stiffness, frozen shoulder, or loss of motion.  Rotator cuff tendon tear.  Recurring symptoms, especially if activity is resumed too soon, with overuse, with a direct blow, or when  using poor technique. TREATMENT  Treatment first involves the use of ice and medicine, to reduce pain and inflammation. The use of strengthening and stretching exercises  may help reduce pain with activity. These exercises may be performed at home or with a therapist. If non-surgical treatment is unsuccessful after more than 6 months, surgery may be advised. After surgery and rehabilitation, activity is usually possible in 3 months.  MEDICATION  If pain medicine is needed, nonsteroidal anti-inflammatory medicines (aspirin and ibuprofen), or other minor pain relievers (acetaminophen), are often advised.  Do not take pain medicine for 7 days before surgery.  Prescription pain relievers may be given, if your caregiver thinks they are needed. Use only as directed and only as much as you need.  Corticosteroid injections may be given by your caregiver. These injections should be reserved for the most serious cases, because they may only be given a certain number of times. HEAT AND COLD  Cold treatment (icing) should be applied for 10 to 15 minutes every 2 to 3 hours for inflammation and pain, and immediately after activity that aggravates your symptoms. Use ice packs or an ice massage.  Heat treatment may be used before performing stretching and strengthening activities prescribed by your caregiver, physical therapist, or athletic trainer. Use a heat pack or a warm water soak. SEEK MEDICAL CARE IF:   Symptoms get worse or do not improve in 4 to 6 weeks, despite treatment.  New, unexplained symptoms develop. (Drugs used in treatment may produce side effects.) EXERCISES   Do 3 sets of 10 for each exercise, once a day , HOLD POSITION FOR 2 SEC   RANGE OF MOTION (ROM) AND STRETCHING EXERCISES - Impingement Syndrome (Rotator Cuff  Tendinitis, Bursitis) These exercises may help you when beginning to rehabilitate your injury. Your symptoms may go away with or without further involvement from your physician, physical therapist or athletic trainer. While completing these exercises, remember:   Restoring tissue flexibility helps normal motion to return to the joints. This  allows healthier, less painful movement and activity.  An effective stretch should be held for at least 30 seconds.  A stretch should never be painful. You should only feel a gentle lengthening or release in the stretched tissue. STRETCH  Flexion, Standing  Stand with good posture. With an underhand grip on your right / left hand, and an overhand grip on the opposite hand, grasp a broomstick or cane so that your hands are a little more than shoulder width apart.  Keeping your right / left elbow straight and shoulder muscles relaxed, push the stick with your opposite hand, to raise your right / left arm in front of your body and then overhead. Raise your arm until you feel a stretch in your right / left shoulder, but before you have increased shoulder pain.  Try to avoid shrugging your right / left shoulder as your arm rises, by keeping your shoulder blade tucked down and toward your mid-back spine. Hold for __________ seconds.  Slowly return to the starting position. Repeat __________ times. Complete this exercise __________ times per day. STRETCH  Abduction, Supine  Lie on your back. With an underhand grip on your right / left hand and an overhand grip on the opposite hand, grasp a broomstick or cane so that your hands are a little more than shoulder width apart.  Keeping your right / left elbow straight and your shoulder muscles relaxed, push the stick with your opposite hand, to raise your right /  left arm out to the side of your body and then overhead. Raise your arm until you feel a stretch in your right / left shoulder, but before you have increased shoulder pain.  Try to avoid shrugging your right / left shoulder as your arm rises, by keeping your shoulder blade tucked down and toward your mid-back spine. Hold for __________ seconds.  Slowly return to the starting position. Repeat __________ times. Complete this exercise __________ times per day. ROM  Flexion, Active-Assisted  Lie  on your back. You may bend your knees for comfort.  Grasp a broomstick or cane so your hands are about shoulder width apart. Your right / left hand should grip the end of the stick, so that your hand is positioned "thumbs-up," as if you were about to shake hands.  Using your healthy arm to lead, raise your right / left arm overhead, until you feel a gentle stretch in your shoulder. Hold for __________ seconds.  Use the stick to assist in returning your right / left arm to its starting position. Repeat __________ times. Complete this exercise __________ times per day.  ROM - Internal Rotation, Supine   Lie on your back on a firm surface. Place your right / left elbow about 60 degrees away from your side. Elevate your elbow with a folded towel, so that the elbow and shoulder are the same height.  Using a broomstick or cane and your strong arm, pull your right / left hand toward your body until you feel a gentle stretch, but no increase in your shoulder pain. Keep your shoulder and elbow in place throughout the exercise.  Hold for __________ seconds. Slowly return to the starting position. Repeat __________ times. Complete this exercise __________ times per day. STRETCH - Internal Rotation  Place your right / left hand behind your back, palm up.  Throw a towel or belt over your opposite shoulder. Grasp the towel with your right / left hand.  While keeping an upright posture, gently pull up on the towel, until you feel a stretch in the front of your right / left shoulder.  Avoid shrugging your right / left shoulder as your arm rises, by keeping your shoulder blade tucked down and toward your mid-back spine.  Hold for __________ seconds. Release the stretch, by lowering your healthy hand. Repeat __________ times. Complete this exercise __________ times per day. ROM - Internal Rotation   Using an underhand grip, grasp a stick behind your back with both hands.  While standing upright with  good posture, slide the stick up your back until you feel a mild stretch in the front of your shoulder.  Hold for __________ seconds. Slowly return to your starting position. Repeat __________ times. Complete this exercise __________ times per day.  STRETCH  Posterior Shoulder Capsule   Stand or sit with good posture. Grasp your right / left elbow and draw it across your chest, keeping it at the same height as your shoulder.  Pull your elbow, so your upper arm comes in closer to your chest. Pull until you feel a gentle stretch in the back of your shoulder.  Hold for __________ seconds. Repeat __________ times. Complete this exercise __________ times per day. STRENGTHENING EXERCISES - Impingement Syndrome (Rotator Cuff Tendinitis, Bursitis) These exercises may help you when beginning to rehabilitate your injury. They may resolve your symptoms with or without further involvement from your physician, physical therapist or athletic trainer. While completing these exercises, remember:  Muscles can gain both  the endurance and the strength needed for everyday activities through controlled exercises.  Complete these exercises as instructed by your physician, physical therapist or athletic trainer. Increase the resistance and repetitions only as guided.  You may experience muscle soreness or fatigue, but the pain or discomfort you are trying to eliminate should never worsen during these exercises. If this pain does get worse, stop and make sure you are following the directions exactly. If the pain is still present after adjustments, discontinue the exercise until you can discuss the trouble with your clinician.  During your recovery, avoid activity or exercises which involve actions that place your injured hand or elbow above your head or behind your back or head. These positions stress the tissues which you are trying to heal. STRENGTH - Scapular Depression and Adduction   With good posture, sit on a  firm chair. Support your arms in front of you, with pillows, arm rests, or on a table top. Have your elbows in line with the sides of your body.  Gently draw your shoulder blades down and toward your mid-back spine. Gradually increase the tension, without tensing the muscles along the top of your shoulders and the back of your neck.  Hold for __________ seconds. Slowly release the tension and relax your muscles completely before starting the next repetition.  After you have practiced this exercise, remove the arm support and complete the exercise in standing as well as sitting position. Repeat __________ times. Complete this exercise __________ times per day.  STRENGTH - Shoulder Abductors, Isometric  With good posture, stand or sit about 4-6 inches from a wall, with your right / left side facing the wall.  Bend your right / left elbow. Gently press your right / left elbow into the wall. Increase the pressure gradually, until you are pressing as hard as you can, without shrugging your shoulder or increasing any shoulder discomfort.  Hold for __________ seconds.  Release the tension slowly. Relax your shoulder muscles completely before you begin the next repetition. Repeat __________ times. Complete this exercise __________ times per day.  STRENGTH - External Rotators, Isometric  Keep your right / left elbow at your side and bend it 90 degrees.  Step into a door frame so that the outside of your right / left wrist can press against the door frame without your upper arm leaving your side.  Gently press your right / left wrist into the door frame, as if you were trying to swing the back of your hand away from your stomach. Gradually increase the tension, until you are pressing as hard as you can, without shrugging your shoulder or increasing any shoulder discomfort.  Hold for __________ seconds.  Release the tension slowly. Relax your shoulder muscles completely before you begin the next  repetition. Repeat __________ times. Complete this exercise __________ times per day.  STRENGTH - Supraspinatus   Stand or sit with good posture. Grasp a __________ weight, or an exercise band or tubing, so that your hand is "thumbs-up," like you are shaking hands.  Slowly lift your right / left arm in a "V" away from your thigh, diagonally into the space between your side and straight ahead. Lift your hand to shoulder height or as far as you can, without increasing any shoulder pain. At first, many people do not lift their hands above shoulder height.  Avoid shrugging your right / left shoulder as your arm rises, by keeping your shoulder blade tucked down and toward your mid-back spine.  Hold for __________ seconds. Control the descent of your hand, as you slowly return to your starting position. Repeat __________ times. Complete this exercise __________ times per day.  STRENGTH - External Rotators  Secure a rubber exercise band or tubing to a fixed object (table, pole) so that it is at the same height as your right / left elbow when you are standing or sitting on a firm surface.  Stand or sit so that the secured exercise band is at your uninjured side.  Bend your right / left elbow 90 degrees. Place a folded towel or small pillow under your right / left arm, so that your elbow is a few inches away from your side.  Keeping the tension on the exercise band, pull it away from your body, as if pivoting on your elbow. Be sure to keep your body steady, so that the movement is coming only from your rotating shoulder.  Hold for __________ seconds. Release the tension in a controlled manner, as you return to the starting position. Repeat __________ times. Complete this exercise __________ times per day.  STRENGTH - Internal Rotators   Secure a rubber exercise band or tubing to a fixed object (table, pole) so that it is at the same height as your right / left elbow when you are standing or sitting  on a firm surface.  Stand or sit so that the secured exercise band is at your right / left side.  Bend your elbow 90 degrees. Place a folded towel or small pillow under your right / left arm so that your elbow is a few inches away from your side.  Keeping the tension on the exercise band, pull it across your body, toward your stomach. Be sure to keep your body steady, so that the movement is coming only from your rotating shoulder.  Hold for __________ seconds. Release the tension in a controlled manner, as you return to the starting position. Repeat __________ times. Complete this exercise __________ times per day.  STRENGTH - Scapular Protractors, Standing   Stand arms length away from a wall. Place your hands on the wall, keeping your elbows straight.  Begin by dropping your shoulder blades down and toward your mid-back spine.  To strengthen your protractors, keep your shoulder blades down, but slide them forward on your rib cage. It will feel as if you are lifting the back of your rib cage away from the wall. This is a subtle motion and can be challenging to complete. Ask your caregiver for further instruction, if you are not sure you are doing the exercise correctly.  Hold for __________ seconds. Slowly return to the starting position, resting the muscles completely before starting the next repetition. Repeat __________ times. Complete this exercise __________ times per day. STRENGTH - Scapular Protractors, Supine  Lie on your back on a firm surface. Extend your right / left arm straight into the air while holding a __________ weight in your hand.  Keeping your head and back in place, lift your shoulder off the floor.  Hold for __________ seconds. Slowly return to the starting position, and allow your muscles to relax completely before starting the next repetition. Repeat __________ times. Complete this exercise __________ times per day. STRENGTH - Scapular Protractors,  Quadruped  Get onto your hands and knees, with your shoulders directly over your hands (or as close as you can be, comfortably).  Keeping your elbows locked, lift the back of your rib cage up into your shoulder blades, so  your mid-back rounds out. Keep your neck muscles relaxed.  Hold this position for __________ seconds. Slowly return to the starting position and allow your muscles to relax completely before starting the next repetition. Repeat __________ times. Complete this exercise __________ times per day.  STRENGTH - Scapular Retractors  Secure a rubber exercise band or tubing to a fixed object (table, pole), so that it is at the height of your shoulders when you are either standing, or sitting on a firm armless chair.  With a palm down grip, grasp an end of the band in each hand. Straighten your elbows and lift your hands straight in front of you, at shoulder height. Step back, away from the secured end of the band, until it becomes tense.  Squeezing your shoulder blades together, draw your elbows back toward your sides, as you bend them. Keep your upper arms lifted away from your body throughout the exercise.  Hold for __________ seconds. Slowly ease the tension on the band, as you reverse the directions and return to the starting position. Repeat __________ times. Complete this exercise __________ times per day. STRENGTH - Shoulder Extensors   Secure a rubber exercise band or tubing to a fixed object (table, pole) so that it is at the height of your shoulders when you are either standing, or sitting on a firm armless chair.  With a thumbs-up grip, grasp an end of the band in each hand. Straighten your elbows and lift your hands straight in front of you, at shoulder height. Step back, away from the secured end of the band, until it becomes tense.  Squeezing your shoulder blades together, pull your hands down to the sides of your thighs. Do not allow your hands to go behind  you.  Hold for __________ seconds. Slowly ease the tension on the band, as you reverse the directions and return to the starting position. Repeat __________ times. Complete this exercise __________ times per day.  STRENGTH - Scapular Retractors and External Rotators   Secure a rubber exercise band or tubing to a fixed object (table, pole) so that it is at the height as your shoulders, when you are either standing, or sitting on a firm armless chair.  With a palm down grip, grasp an end of the band in each hand. Bend your elbows 90 degrees and lift your elbows to shoulder height, at your sides. Step back, away from the secured end of the band, until it becomes tense.  Squeezing your shoulder blades together, rotate your shoulders so that your upper arms and elbows remain stationary, but your fists travel upward to head height.  Hold for __________ seconds. Slowly ease the tension on the band, as you reverse the directions and return to the starting position. Repeat __________ times. Complete this exercise __________ times per day.  STRENGTH - Scapular Retractors and External Rotators, Rowing   Secure a rubber exercise band or tubing to a fixed object (table, pole) so that it is at the height of your shoulders, when you are either standing, or sitting on a firm armless chair.  With a palm down grip, grasp an end of the band in each hand. Straighten your elbows and lift your hands straight in front of you, at shoulder height. Step back, away from the secured end of the band, until it becomes tense.  Step 1: Squeeze your shoulder blades together. Bending your elbows, draw your hands to your chest, as if you are rowing a boat. At the end of  this motion, your hands and elbow should be at shoulder height and your elbows should be out to your sides.  Step 2: Rotate your shoulders, to raise your hands above your head. Your forearms should be vertical and your upper arms should be horizontal.  Hold for  __________ seconds. Slowly ease the tension on the band, as you reverse the directions and return to the starting position. Repeat __________ times. Complete this exercise __________ times per day.  STRENGTH  Scapular Depressors  Find a sturdy chair without wheels, such as a dining room chair.  Keeping your feet on the floor, and your hands on the chair arms, lift your bottom up from the seat, and lock your elbows.  Keeping your elbows straight, allow gravity to pull your body weight down. Your shoulders will rise toward your ears.  Raise your body against gravity by drawing your shoulder blades down your back, shortening the distance between your shoulders and ears. Although your feet should always maintain contact with the floor, your feet should progressively support less body weight, as you get stronger.  Hold for __________ seconds. In a controlled and slow manner, lower your body weight to begin the next repetition. Repeat __________ times. Complete this exercise __________ times per day.  Document Released: 01/28/2005 Document Revised: 04/22/2011 Document Reviewed: 05/12/2008 [facility] Patient Information 2014 [place], [place].

## 2013-08-27 DIAGNOSIS — M5412 Radiculopathy, cervical region: Secondary | ICD-10-CM | POA: Insufficient documentation

## 2013-08-27 DIAGNOSIS — M4802 Spinal stenosis, cervical region: Secondary | ICD-10-CM | POA: Insufficient documentation

## 2013-09-23 DIAGNOSIS — M62838 Other muscle spasm: Secondary | ICD-10-CM | POA: Insufficient documentation

## 2015-07-31 ENCOUNTER — Ambulatory Visit (INDEPENDENT_AMBULATORY_CARE_PROVIDER_SITE_OTHER): Payer: 59 | Admitting: Orthopedic Surgery

## 2015-07-31 VITALS — BP 139/92 | HR 86 | Ht 71.0 in | Wt 167.0 lb

## 2015-07-31 DIAGNOSIS — M75101 Unspecified rotator cuff tear or rupture of right shoulder, not specified as traumatic: Secondary | ICD-10-CM

## 2015-07-31 NOTE — Patient Instructions (Signed)
Joint Injection  Care After  Refer to this sheet in the next few days. These instructions provide you with information on caring for yourself after you have had a joint injection. Your caregiver also may give you more specific instructions. Your treatment has been planned according to current medical practices, but problems sometimes occur. Call your caregiver if you have any problems or questions after your procedure.  After any type of joint injection, it is not uncommon to experience:  Soreness, swelling, or bruising around the injection site.  Mild numbness, tingling, or weakness around the injection site caused by the numbing medicine used before or with the injection. It also is possible to experience the following effects associated with the specific agent after injection:  Iodine-based contrast agents:  Allergic reaction (itching, hives, widespread redness, and swelling beyond the injection site).  Corticosteroids (These effects are rare.):  Allergic reaction.  Increased blood sugar levels (If you have diabetes and you notice that your blood sugar levels have increased, notify your caregiver).  Increased blood pressure levels.  Mood swings.  Hyaluronic acid in the use of viscosupplementation.  Temporary heat or redness.  Temporary rash and itching.  Increased fluid accumulation in the injected joint. These effects all should resolve within a day after your procedure.  HOME CARE INSTRUCTIONS  Limit yourself to light activity the day of your procedure. Avoid lifting heavy objects, bending, stooping, or twisting.  Take prescription or over-the-counter pain medication as directed by your caregiver.  You may apply ice to your injection site to reduce pain and swelling the day of your procedure. Ice may be applied 3-4 times:  Put ice in a plastic bag.  Place a towel between your skin and the bag.  Leave the ice on for no longer than 15-20 minutes each time. SEEK IMMEDIATE MEDICAL CARE IF:   Pain and swelling get worse rather than better or extend beyond the injection site.  Numbness does not go away.  Blood or fluid continues to leak from the injection site.  You have chest pain.  You have swelling of your face or tongue.  You have trouble breathing or you become dizzy.  You develop a fever, chills, or severe tenderness at the injection site that last longer than 1 day. MAKE SURE YOU:  Understand these instructions.  Watch your condition.  Get help right away if you are not doing well or if you get worse. Document Released: 10/11/2010 Document Revised: 04/22/2011 Document Reviewed: 10/11/2010  [facility] Patient Information 2014 [place].

## 2015-08-01 NOTE — Progress Notes (Signed)
Chief Complaint  Patient presents with  . 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 He presents for repeat injection. His rotator cuff strength is intact he has no recent injury    Procedure note the subacromial injection shoulder RIGHT  Verbal consent was obtained to inject the  RIGHT   Shoulder  Timeout was completed to confirm the injection site is a subacromial space of the  RIGHT  shoulder   Medication used Depo-Medrol 40 mg and lidocaine 1% 3 cc  Anesthesia was provided by ethyl chloride  The injection was performed in the RIGHT  posterior subacromial space. After pinning the skin with alcohol and anesthetized the skin with ethyl chloride the subacromial space was injected using a 20-gauge needle. There were no complications  Sterile dressing was applied.

## 2015-11-24 DIAGNOSIS — S91331A Puncture wound without foreign body, right foot, initial encounter: Secondary | ICD-10-CM | POA: Diagnosis not present

## 2016-05-21 ENCOUNTER — Ambulatory Visit (INDEPENDENT_AMBULATORY_CARE_PROVIDER_SITE_OTHER): Payer: 59 | Admitting: Orthopedic Surgery

## 2016-05-21 ENCOUNTER — Ambulatory Visit: Payer: 59

## 2016-05-21 ENCOUNTER — Encounter: Payer: Self-pay | Admitting: Orthopedic Surgery

## 2016-05-21 VITALS — BP 136/87 | HR 91 | Wt 158.0 lb

## 2016-05-21 DIAGNOSIS — M75102 Unspecified rotator cuff tear or rupture of left shoulder, not specified as traumatic: Secondary | ICD-10-CM

## 2016-05-21 DIAGNOSIS — M47812 Spondylosis without myelopathy or radiculopathy, cervical region: Secondary | ICD-10-CM

## 2016-05-21 DIAGNOSIS — G8929 Other chronic pain: Secondary | ICD-10-CM

## 2016-05-21 DIAGNOSIS — M25511 Pain in right shoulder: Secondary | ICD-10-CM | POA: Diagnosis not present

## 2016-05-21 DIAGNOSIS — M75101 Unspecified rotator cuff tear or rupture of right shoulder, not specified as traumatic: Secondary | ICD-10-CM

## 2016-05-21 NOTE — Progress Notes (Signed)
Patient ID: Steven Whitney, male   DOB: [birth year], 56 y.o.   MRN: [id number]  Chief Complaint  Patient presents with  . Shoulder Pain    RIGHT SHOULDER PAIN    HPI Steven Whitney is a 56 y.o. male.  I saw him 2 years ago for right shoulder pain. 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 He did have some cervical disc problems which she got epidurals for his last MRI was in 2014 and that showed a right paracentral disc protrusion at C5 and 6 arthritis at C4 and 5 C6-7 disc protrusion centrally with right paracentral tear  He actually comes in today complaining of bilateral shoulder pain pain along his scapulae and upper traps with weakness numbness and tingling and decreased range of motion in the cervical spine  Review of Systems Review of Systems  Constitutional: Negative for activity change and unexpected weight change.  Musculoskeletal: Positive for arthralgias and neck pain.   (2 MINIMUM)  Past Medical History:  Diagnosis Date  . COPD (chronic obstructive pulmonary disease) (HCC)    LUNG DISEASE   . Ulcer [facility])     Past Surgical History:  Procedure Laterality Date  . LUNG SURGERY  2001  . [place]    Social History Social History  Substance Use Topics  . Smoking status: Unknown If Ever Smoked  . Smokeless tobacco: Never Used  . Alcohol use Not on file    No Known Allergies  Current Meds  Medication Sig  . omeprazole (PRILOSEC) 20 MG capsule Take 20 mg by mouth daily.      Physical Exam Physical Exam 1.BP 136/87   Pulse 91   Wt 158 lb (71.7 kg)   BMI 22.04 kg/m   2. Gen. appearance. The patient is well-developed and well-nourished, grooming and hygiene are normal. There are no gross congenital abnormalities  3. The patient is alert and oriented to person place and time  4. Mood and affect are normal  5. Ambulation  Remains normal without any evidence of  tripping or nystatin was no shuffling gait  Examination reveals the following: cervical spine tenderness in the cervical spine pain with flexion extension and rotation reproduction of symptoms with rotation and Spurling sign  Both right and left shoulders definitely reveal crepitance on range of motion but he has full passive range of motion. Pain at about 120 of flexion is a positive impingement sign on the right and left. Both shoulders are stable. Rotator cuff strength in both arms is normal. Skin over the shoulder right and left as well as cervical spine show no rash or congenital lesions  His good pulses distally in the right and left wrist with no lymph nodes positive in the supraclavicular region of the right and left shoulders  His reflexes are 2+ and equal in each side is sensation remains normal    MEDICAL DECISION MAKING:    Data Reviewed  I reviewed his MRI as stated above, I pulled his old records from my chart  Assessment Encounter Diagnoses  Name Primary?  . Chronic right shoulder pain Yes  . Rotator cuff syndrome of right shoulder   . Rotator cuff syndrome of left shoulder   . Cervical spondylosis without myelopathy      Plan  I repeated his subacromial injections. There is no surgery needed as he has excellent rotator cuff strength  Are recommended he see neurosurgeon for further evaluation and treatment of his neck pain and radicular symptoms  Arther Abbott 05/21/2016, 12:25 PM

## 2016-05-21 NOTE — Patient Instructions (Addendum)
You have received an injection of steroids into the joint. 15% of patients will have increased pain within the 24 hours postinjection.   This is transient and will go away.   We recommend that you use ice packs on the injection site for 20 minutes every 2 hours and extra strength Tylenol 2 tablets every 8 as needed until the pain resolves.  If you continue to have pain after taking the Tylenol and using the ice please call the office for further instructions.  Shoulder Range of Motion Exercises Shoulder range of motion (ROM) exercises are designed to keep the shoulder moving freely. They are often recommended for people who have shoulder pain. Phase 1 exercises When you are able, do this exercise 5-6 days per week, or as told by your health care provider. Work toward doing 2 sets of 10 swings. Pendulum Exercise  How To Do This Exercise Lying Down 1. Lie face-down on a bed with your abdomen close to the side of the bed. 2. Let your arm hang over the side of the bed. 3. Relax your shoulder, arm, and hand. 4. Slowly and gently swing your arm forward and back. Do not use your neck muscles to swing your arm. They should be relaxed. If you are struggling to swing your arm, have someone gently swing it for you. When you do this exercise for the first time, swing your arm at a 15 degree angle for 15 seconds, or swing your arm 10 times. As pain lessens over time, increase the angle of the swing to 30-45 degrees. 5. Repeat steps 1-4 with the other arm. How To Do This Exercise While Standing 1. Stand next to a sturdy chair or table and hold on to it with your hand. 1. Bend forward at the waist. 2. Bend your knees slightly. 3. Relax your other arm and let it hang limp. 4. Relax the shoulder blade of the arm that is hanging and let it drop. 5. While keeping your shoulder relaxed, use body motion to swing your arm in small circles. The first time you do this exercise, swing your arm for about 30 seconds  or 10 times. When you do it next time, swing your arm for a little longer. 6. Stand up tall and relax. 7. Repeat steps 1-7, this time changing the direction of the circles. 2. Repeat steps 1-8 with the other arm. Phase 2 exercises Do these exercises 3-4 times per day on 5-6 days per week or as told by your health care provider. Work toward holding the stretch for 20 seconds. Stretching Exercise 1  1. Lift your arm straight out in front of you. 2. Bend your arm 90 degrees at the elbow (right angle) so your forearm goes across your body and looks like the letter "L." 3. Use your other arm to gently pull the elbow forward and across your body. 4. Repeat steps 1-3 with the other arm. Stretching Exercise 2  You will need a towel or rope for this exercise. 1. Bend one arm behind your back with the palm facing outward. 2. Hold a towel with your other hand. 3. Reach the arm that holds the towel above your head, and bend that arm at the elbow. Your wrist should be behind your neck. 4. Use your free hand to grab the free end of the towel. 5. With the higher hand, gently pull the towel up behind you. 6. With the lower hand, pull the towel down behind you. 7. Repeat steps  1-6 with the other arm. Phase 3 exercises Do each of these exercises at four different times of day (sessions) every day or as told by your health care provider. To begin with, repeat each exercise 5 times (repetitions). Work toward doing 3 sets of 12 repetitions or as told by your health care provider. Strengthening Exercise 1  You will need a light weight for this activity. As you grow stronger, you may use a heavier weight. 1. Standing with a weight in your hand, lift your arm straight out to the side until it is at the same height as your shoulder. 2. Bend your arm at 90 degrees so that your fingers are pointing to the ceiling. 3. Slowly raise your hand until your arm is straight up in the air. 4. Repeat steps 1-3 with the other  arm. Strengthening Exercise 2  You will need a light weight for this activity. As you grow stronger, you may use a heavier weight. 1. Standing with a weight in your hand, gradually move your straight arm in an arc, starting at your side, then out in front of you, then straight up over your head. 2. Gradually move your other arm in an arc, starting at your side, then out in front of you, then straight up over your head. 3. Repeat steps 1-2 with the other arm. Strengthening Exercise 3  You will need an elastic band for this activity. As you grow stronger, gradually increase the size of the bands or increase the number of bands that you use at one time. 1. While standing, hold an elastic band in one hand and raise that arm up in the air. 2. With your other hand, pull down the band until that hand is by your side. 3. Repeat steps 1-2 with the other arm. This information is not intended to replace advice given to you by your health care provider. Make sure you discuss any questions you have with your health care provider. Document Released: 10/27/2002 Document Revised: 09/24/2015 Document Reviewed: 01/24/2014 Elsevier Interactive Patient Education  2017 [employer].

## 2016-05-29 DIAGNOSIS — R3914 Feeling of incomplete bladder emptying: Secondary | ICD-10-CM | POA: Diagnosis not present

## 2016-05-29 DIAGNOSIS — E291 Testicular hypofunction: Secondary | ICD-10-CM | POA: Diagnosis not present

## 2016-05-29 DIAGNOSIS — Z79899 Other long term (current) drug therapy: Secondary | ICD-10-CM | POA: Diagnosis not present

## 2016-05-29 DIAGNOSIS — H5213 Myopia, bilateral: Secondary | ICD-10-CM | POA: Diagnosis not present

## 2016-05-29 DIAGNOSIS — N5201 Erectile dysfunction due to arterial insufficiency: Secondary | ICD-10-CM | POA: Diagnosis not present

## 2016-05-29 DIAGNOSIS — N401 Enlarged prostate with lower urinary tract symptoms: Secondary | ICD-10-CM | POA: Diagnosis not present

## 2016-05-29 DIAGNOSIS — H524 Presbyopia: Secondary | ICD-10-CM | POA: Diagnosis not present

## 2016-06-04 DIAGNOSIS — R3914 Feeling of incomplete bladder emptying: Secondary | ICD-10-CM | POA: Diagnosis not present

## 2016-06-04 DIAGNOSIS — R350 Frequency of micturition: Secondary | ICD-10-CM | POA: Diagnosis not present

## 2016-06-04 DIAGNOSIS — N401 Enlarged prostate with lower urinary tract symptoms: Secondary | ICD-10-CM | POA: Diagnosis not present

## 2016-06-04 DIAGNOSIS — R3915 Urgency of urination: Secondary | ICD-10-CM | POA: Diagnosis not present

## 2016-06-05 DIAGNOSIS — M542 Cervicalgia: Secondary | ICD-10-CM | POA: Diagnosis not present

## 2016-06-05 DIAGNOSIS — G8929 Other chronic pain: Secondary | ICD-10-CM | POA: Diagnosis not present

## 2016-06-07 DIAGNOSIS — R3915 Urgency of urination: Secondary | ICD-10-CM | POA: Diagnosis not present

## 2016-06-07 DIAGNOSIS — N401 Enlarged prostate with lower urinary tract symptoms: Secondary | ICD-10-CM | POA: Diagnosis not present

## 2016-06-07 DIAGNOSIS — R350 Frequency of micturition: Secondary | ICD-10-CM | POA: Diagnosis not present

## 2016-08-16 ENCOUNTER — Ambulatory Visit: Admission: RE | Admit: 2016-08-16 | Discharge: 2016-08-16 | Admitting: Physical Medicine & Rehabilitation

## 2016-08-16 DIAGNOSIS — M12811 Other specific arthropathies, not elsewhere classified, right shoulder: Secondary | ICD-10-CM

## 2016-08-16 DIAGNOSIS — G8929 Other chronic pain: Secondary | ICD-10-CM

## 2016-08-16 DIAGNOSIS — M25511 Pain in right shoulder: Principal | ICD-10-CM

## 2016-08-16 DIAGNOSIS — M12812 Other specific arthropathies, not elsewhere classified, left shoulder: Secondary | ICD-10-CM

## 2016-08-16 DIAGNOSIS — M792 Neuralgia and neuritis, unspecified: Secondary | ICD-10-CM

## 2016-08-16 DIAGNOSIS — M25512 Pain in left shoulder: Secondary | ICD-10-CM

## 2016-08-16 DIAGNOSIS — M546 Pain in thoracic spine: Secondary | ICD-10-CM

## 2016-08-16 MED ORDER — GABAPENTIN 300 MG CAPSULE
ORAL_CAPSULE | Freq: Every evening | ORAL | 1 refills | 0 days | Status: CP
Start: 2016-08-16 — End: 2016-08-20

## 2016-08-20 MED ORDER — GABAPENTIN 300 MG CAPSULE
ORAL_CAPSULE | Freq: Every evening | ORAL | 2 refills | 0.00000 days | Status: CP
Start: 2016-08-20 — End: 2016-08-20

## 2016-08-20 MED ORDER — GABAPENTIN 300 MG CAPSULE: 300 mg | capsule | Freq: Every evening | 2 refills | 0 days | Status: AC

## 2016-10-29 ENCOUNTER — Ambulatory Visit: Payer: 59 | Admitting: Neurology

## 2017-03-12 ENCOUNTER — Encounter: Payer: Self-pay | Admitting: Gastroenterology

## 2017-03-12 ENCOUNTER — Encounter (INDEPENDENT_AMBULATORY_CARE_PROVIDER_SITE_OTHER): Payer: Self-pay

## 2017-03-12 ENCOUNTER — Ambulatory Visit: Payer: BLUE CROSS/BLUE SHIELD | Admitting: Gastroenterology

## 2017-03-12 VITALS — BP 134/81 | HR 79 | Ht 68.0 in | Wt 173.0 lb

## 2017-03-12 DIAGNOSIS — R197 Diarrhea, unspecified: Secondary | ICD-10-CM

## 2017-03-12 DIAGNOSIS — R4702 Dysphasia: Secondary | ICD-10-CM | POA: Diagnosis not present

## 2017-03-12 DIAGNOSIS — R109 Unspecified abdominal pain: Secondary | ICD-10-CM

## 2017-03-12 NOTE — Patient Instructions (Signed)
F/U  3 months 

## 2017-03-12 NOTE — Progress Notes (Signed)
Steven Whitney [address]  [place]  [place], [place] [address]  Main: [phone]  Fax: [phone]   Gastroenterology Consultation  Referring Provider:     Center, [place] Physician:  Center, [facility] Primary Gastroenterologist:  Dr. Vonda Whitney Reason for Consultation:     Diarrhea        HPI:   OREL COOLER is a 57 y.o. y/o male referred for consultation & management  by Dr. Domingo Madeira, [facility].  Patient reports 61-monthhistory of loose stools.  Reports upon waking up, he has 3 watery stools daily for the last 2 months.  After this he does not have any other bowel movements throughout the day.  Reports seeing blood streaks in his stool every day for the last 2 months.  He thinks that it might be from a hemorrhoid, as it was previously painful but it is not anymore.  Reports, intermittent, midepigastric, 3/10, cramping abdominal pain.  Not related to meals.  Denies any nausea or vomiting.  Denies weight loss, reports that he has gained weight.  No prior history of similar symptoms.  No recent history of travel.  No fever or chills.  No family history of colon cancer.  No previous colonoscopy or endoscopy.  Primary care physician ordered stool cultures and ova and parasites which were negative.  Patient reports intermittent dysphagia with pills and solids for the last 2 months.  States solid food gets stuck in his throat, he drinks a lot of water and never has to bring food back up however.  No previous ER visits for food impactions.  Takes 3 Aleve daily.  Was started on omeprazole due to heartburn, states heartburn is controlled since being started on the PPI by his primary care physician.  Past Medical History:  Diagnosis Date  . COPD (chronic obstructive pulmonary disease) (HCC)    LUNG DISEASE   . Ulcer     Past Surgical History:  Procedure Laterality Date  . [place]G [place]     Prior to Admission medications   Medication Sig Start Date End Date Taking? Authorizing Provider  LYRICA 225 MG capsule Take 225 mg by mouth 2 (two) times daily. 03/10/17  Yes [provider]  methocarbamol (ROBAXIN) 500 MG tablet  03/10/17  Yes [provider]  Multiple Vitamin (MULTIVITAMIN) capsule Take by mouth.   Yes [provider]  omeprazole (PRILOSEC) 20 MG capsule Take 20 mg by mouth daily.   Yes [provider]  orphenadrine (NORFLEX) 100 MG tablet Take by mouth. 09/23/13  Yes [provider]    Family History  Problem Relation Age of Onset  . Cancer Unknown   . Ulcerative colitis Unknown      Social History   Tobacco Use  . Smoking status: Former SResearch scientist (life sciences) . Smokeless tobacco: Never Used  Substance Use Topics  . Alcohol use: No    Frequency: Never  . Drug use: No    Allergies as of 03/12/2017  . (No Known Allergies)    Review of Systems:    All systems reviewed and negative except where noted in HPI.   Physical Exam:  BP 134/81   Pulse 79   Ht 5' 8"  (1.727 m)   Wt 173 lb (78.5 kg)   BMI 26.30 kg/m  No LMP for male patient. Psych:  Alert and cooperative. Normal mood and affect. General:  Alert,  Well-developed, well-nourished, pleasant and cooperative in NAD Head:  Normocephalic and atraumatic. Eyes:  Sclera clear, no icterus.   Conjunctiva pink. Ears:  Normal auditory acuity. Nose:  No deformity, discharge, or lesions. Mouth:  No deformity or lesions,oropharynx pink & moist. Neck:  Supple; no masses or thyromegaly. Lungs:  Respirations even and unlabored.  Clear throughout to auscultation.   No wheezes, crackles, or rhonchi. No acute distress. Heart:  Regular rate and rhythm; no murmurs, clicks, rubs, or gallops. Abdomen:  Normal bowel sounds.  No bruits.  Soft, non-tender and non-distended without masses, hepatosplenomegaly or hernias noted.  No guarding or rebound tenderness.    Msk:  Symmetrical without  gross deformities. Good, equal movement & strength bilaterally. Pulses:  Normal pulses noted. Extremities:  No clubbing or edema.  No cyanosis. Neurologic:  Alert and oriented x3;  grossly normal neurologically. Skin:  Intact without significant lesions or rashes. No jaundice. Lymph Nodes:  No significant cervical adenopathy. Psych:  Alert and cooperative. Normal mood and affect.   Labs: CBC No results found for: WBC, RBC, HGB, HCT, PLT, MCV, MCH, MCHC, RDW, LYMPHSABS, MONOABS, EOSABS, BASOSABS CMP  No results found for: NA, K, CL, CO2, GLUCOSE, BUN, CREATININE, CALCIUM, PROT, ALBUMIN, AST, ALT, ALKPHOS, BILITOT, GFRNONAA, GFRAA  Imaging Studies: No results found.  Assessment and Plan:   ZAMARIAN SCARANO is a 57 y.o. y/o male has been referred for 71-monthhistory of loose stool.  Loose stools are only in the morning, and none during the day Also reporting blood in stool Unlikely to be infectious, given symptoms have been ongoing for 2 months.  In addition they do not occur all day and are only in the no fever chills.  No recent history of travel, stool studies negative recently. Also has history of fibromyalgia Symptoms could be related to IBS Has not had CBC done in a while.  We will check for any underlying anemia.  Has never had a screening colonoscopy.  Will order at this time.  Patient is agreeable with this.  We will also obtain biopsies during the colonoscopy to evaluate for microscopic colitis.  Reports intermittent dysphagia for the last 2 months with no weight loss We will order EGD to evaluate for rings, strictures, narrowing, EOE Patient asked to cut down on Aleve use due to his risk of gastric ulcers with the medication. Patient also asked to cut food into small pieces, eat small bites, follow with liquids, and follow dysphagia diet until further evaluation with EGD.  I have discussed alternative options, risks & benefits,  which include, but are not limited to,  bleeding, infection, perforation,respiratory complication & drug reaction.  The patient agrees with this plan & written consent will be obtained.      Dr VVonda Whitney

## 2017-03-14 ENCOUNTER — Other Ambulatory Visit: Payer: Self-pay

## 2017-03-18 ENCOUNTER — Other Ambulatory Visit: Payer: Self-pay

## 2017-03-18 DIAGNOSIS — Z1211 Encounter for screening for malignant neoplasm of colon: Secondary | ICD-10-CM

## 2017-03-18 DIAGNOSIS — R109 Unspecified abdominal pain: Secondary | ICD-10-CM

## 2017-03-18 DIAGNOSIS — R4702 Dysphasia: Secondary | ICD-10-CM

## 2017-03-20 LAB — COMPREHENSIVE METABOLIC PANEL
A/G RATIO: 1.4 (ref 1.2–2.2)
ALT: 15 IU/L (ref 0–44)
AST: 15 IU/L (ref 0–40)
Albumin: 3.9 g/dL (ref 3.5–5.5)
Alkaline Phosphatase: 59 IU/L (ref 39–117)
BUN/Creatinine Ratio: 16 (ref 9–20)
BUN: 14 mg/dL (ref 6–24)
Bilirubin Total: 0.4 mg/dL (ref 0.0–1.2)
CALCIUM: 9.3 mg/dL (ref 8.7–10.2)
CO2: 24 mmol/L (ref 20–29)
Chloride: 105 mmol/L (ref 96–106)
Creatinine, Ser: 0.89 mg/dL (ref 0.76–1.27)
GFR calc Af Amer: 110 mL/min/{1.73_m2} (ref >59)
GFR, EST NON AFRICAN AMERICAN: 95 mL/min/{1.73_m2} (ref >59)
GLUCOSE: 92 mg/dL (ref 65–99)
Globulin, Total: 2.7 g/dL (ref 1.5–4.5)
POTASSIUM: 4.3 mmol/L (ref 3.5–5.2)
Sodium: 141 mmol/L (ref 134–144)
Total Protein: 6.6 g/dL (ref 6.0–8.5)

## 2017-03-24 ENCOUNTER — Other Ambulatory Visit: Payer: Self-pay

## 2017-03-24 ENCOUNTER — Telehealth: Payer: Self-pay

## 2017-03-24 DIAGNOSIS — D508 Other iron deficiency anemias: Secondary | ICD-10-CM

## 2017-03-24 NOTE — Discharge Instructions (Signed)
General Anesthesia, Adult, Care After °These instructions provide you with information about caring for yourself after your procedure. Your health care provider may also give you more specific instructions. Your treatment has been planned according to current medical practices, but problems sometimes occur. Call your health care provider if you have any problems or questions after your procedure. °What can I expect after the procedure? °After the procedure, it is common to have: °· Vomiting. °· A sore throat. °· Mental slowness. ° °It is common to feel: °· Nauseous. °· Cold or shivery. °· Sleepy. °· Tired. °· Sore or achy, even in parts of your body where you did not have surgery. ° °Follow these instructions at home: °For at least 24 hours after the procedure: °· Do not: °? Participate in activities where you could fall or become injured. °? Drive. °? Use heavy machinery. °? Drink alcohol. °? Take sleeping pills or medicines that cause drowsiness. °? Make important decisions or sign legal documents. °? Take care of children on your own. °· Rest. °Eating and drinking °· If you vomit, drink water, juice, or soup when you can drink without vomiting. °· Drink enough fluid to keep your urine clear or pale yellow. °· Make sure you have little or no nausea before eating solid foods. °· Follow the diet recommended by your health care provider. °General instructions °· Have a responsible adult stay with you until you are awake and alert. °· Return to your normal activities as told by your health care provider. Ask your health care provider what activities are safe for you. °· Take over-the-counter and prescription medicines only as told by your health care provider. °· If you smoke, do not smoke without supervision. °· Keep all follow-up visits as told by your health care provider. This is important. °Contact a health care provider if: °· You continue to have nausea or vomiting at home, and medicines are not helpful. °· You  cannot drink fluids or start eating again. °· You cannot urinate after 8-12 hours. °· You develop a skin rash. °· You have fever. °· You have increasing redness at the site of your procedure. °Get help right away if: °· You have difficulty breathing. °· You have chest pain. °· You have unexpected bleeding. °· You feel that you are having a life-threatening or urgent problem. °This information is not intended to replace advice given to you by your health care provider. Make sure you discuss any questions you have with your health care provider. °Document Released: 05/06/2000 Document Revised: 07/03/2015 Document Reviewed: 01/12/2015 °Elsevier Interactive Patient Education © 2018 [employer] ° °

## 2017-03-24 NOTE — Telephone Encounter (Signed)
Pt stated that he had the stool test done when he was at [facility]. I did find those. Refer to referral from [facility] All the test were done except GI Stool panel on 02/25/2017.  I contacted [facility] and they did not do the CBC.  Do I need to place there order for CBC?  Also pt does not want to repeat or do any more stool samples.

## 2017-03-25 ENCOUNTER — Ambulatory Visit: Payer: BLUE CROSS/BLUE SHIELD | Admitting: Anesthesiology

## 2017-03-25 ENCOUNTER — Encounter
Admission: RE | Disposition: A | Discharge status: Home / Self Care | Payer: Self-pay | Source: Ambulatory Visit | Attending: Gastroenterology

## 2017-03-25 ENCOUNTER — Ambulatory Visit
Admission: RE | Admit: 2017-03-25 | Discharge: 2017-03-25 | Disposition: A | Discharge status: Home / Self Care | Payer: BLUE CROSS/BLUE SHIELD | Source: Ambulatory Visit | Attending: Gastroenterology | Admitting: Gastroenterology

## 2017-03-25 DIAGNOSIS — M47812 Spondylosis without myelopathy or radiculopathy, cervical region: Secondary | ICD-10-CM | POA: Diagnosis not present

## 2017-03-25 DIAGNOSIS — J449 Chronic obstructive pulmonary disease, unspecified: Secondary | ICD-10-CM | POA: Diagnosis not present

## 2017-03-25 DIAGNOSIS — K228 Other specified diseases of esophagus: Secondary | ICD-10-CM | POA: Diagnosis not present

## 2017-03-25 DIAGNOSIS — R4702 Dysphasia: Secondary | ICD-10-CM

## 2017-03-25 DIAGNOSIS — R1319 Other dysphagia: Secondary | ICD-10-CM

## 2017-03-25 DIAGNOSIS — K529 Noninfective gastroenteritis and colitis, unspecified: Secondary | ICD-10-CM | POA: Insufficient documentation

## 2017-03-25 DIAGNOSIS — K2950 Unspecified chronic gastritis without bleeding: Secondary | ICD-10-CM | POA: Diagnosis not present

## 2017-03-25 DIAGNOSIS — Z79899 Other long term (current) drug therapy: Secondary | ICD-10-CM | POA: Insufficient documentation

## 2017-03-25 DIAGNOSIS — M19011 Primary osteoarthritis, right shoulder: Secondary | ICD-10-CM | POA: Insufficient documentation

## 2017-03-25 DIAGNOSIS — R197 Diarrhea, unspecified: Secondary | ICD-10-CM

## 2017-03-25 DIAGNOSIS — K6289 Other specified diseases of anus and rectum: Secondary | ICD-10-CM

## 2017-03-25 DIAGNOSIS — Z87891 Personal history of nicotine dependence: Secondary | ICD-10-CM | POA: Insufficient documentation

## 2017-03-25 DIAGNOSIS — M19012 Primary osteoarthritis, left shoulder: Secondary | ICD-10-CM | POA: Diagnosis not present

## 2017-03-25 DIAGNOSIS — R109 Unspecified abdominal pain: Secondary | ICD-10-CM

## 2017-03-25 DIAGNOSIS — Z1211 Encounter for screening for malignant neoplasm of colon: Secondary | ICD-10-CM | POA: Diagnosis not present

## 2017-03-25 DIAGNOSIS — K3189 Other diseases of stomach and duodenum: Secondary | ICD-10-CM | POA: Diagnosis not present

## 2017-03-25 DIAGNOSIS — D124 Benign neoplasm of descending colon: Secondary | ICD-10-CM

## 2017-03-25 DIAGNOSIS — R1310 Dysphagia, unspecified: Secondary | ICD-10-CM | POA: Insufficient documentation

## 2017-03-25 DIAGNOSIS — M797 Fibromyalgia: Secondary | ICD-10-CM | POA: Insufficient documentation

## 2017-03-25 DIAGNOSIS — K219 Gastro-esophageal reflux disease without esophagitis: Secondary | ICD-10-CM | POA: Insufficient documentation

## 2017-03-25 DIAGNOSIS — K2289 Other specified disease of esophagus: Secondary | ICD-10-CM

## 2017-03-25 HISTORY — DX: Headache: R51

## 2017-03-25 HISTORY — PX: ESOPHAGOGASTRODUODENOSCOPY (EGD) WITH PROPOFOL: SHX5813

## 2017-03-25 HISTORY — PX: POLYPECTOMY: SHX5525

## 2017-03-25 HISTORY — DX: Fibromyalgia: M79.7

## 2017-03-25 HISTORY — PX: COLONOSCOPY WITH PROPOFOL: SHX5780

## 2017-03-25 HISTORY — DX: Headache, unspecified: R51.9

## 2017-03-25 HISTORY — DX: Gastro-esophageal reflux disease without esophagitis: K21.9

## 2017-03-25 HISTORY — DX: Unspecified osteoarthritis, unspecified site: M19.90

## 2017-03-25 SURGERY — COLONOSCOPY WITH PROPOFOL
Anesthesia: General | Wound class: Contaminated

## 2017-03-25 MED ORDER — FENTANYL CITRATE (PF) 100 MCG/2ML IJ SOLN: 25.0000 ug | INTRAMUSCULAR | Status: DC | PRN

## 2017-03-25 MED ORDER — MEPERIDINE HCL 25 MG/ML IJ SOLN: 6.2500 mg | INTRAMUSCULAR | Status: DC | PRN

## 2017-03-25 MED ORDER — PROPOFOL 10 MG/ML IV BOLUS
INTRAVENOUS | Status: DC | PRN
  Administered 2017-03-25 (×2): 20 mg via INTRAVENOUS
  Administered 2017-03-25: 10 mg via INTRAVENOUS
  Administered 2017-03-25: 30 mg via INTRAVENOUS
  Administered 2017-03-25 (×10): 20 mg via INTRAVENOUS
  Administered 2017-03-25: 30 mg via INTRAVENOUS
  Administered 2017-03-25 (×4): 20 mg via INTRAVENOUS
  Administered 2017-03-25: 30 mg via INTRAVENOUS
  Administered 2017-03-25 (×2): 20 mg via INTRAVENOUS
  Administered 2017-03-25: 10 mg via INTRAVENOUS
  Administered 2017-03-25 (×4): 20 mg via INTRAVENOUS
  Administered 2017-03-25: 30 mg via INTRAVENOUS
  Administered 2017-03-25 (×3): 20 mg via INTRAVENOUS
  Administered 2017-03-25: 50 mg via INTRAVENOUS
  Administered 2017-03-25: 100 mg via INTRAVENOUS
  Administered 2017-03-25 (×2): 20 mg via INTRAVENOUS

## 2017-03-25 MED ORDER — LIDOCAINE HCL (CARDIAC) 20 MG/ML IV SOLN
INTRAVENOUS | Status: DC | PRN
  Administered 2017-03-25: 40 mg via INTRAVENOUS

## 2017-03-25 MED ORDER — PROMETHAZINE HCL 25 MG/ML IJ SOLN: 6.2500 mg | INTRAMUSCULAR | Status: DC | PRN

## 2017-03-25 MED ORDER — LACTATED RINGERS IV SOLN
INTRAVENOUS | Status: DC | PRN
  Administered 2017-03-25 (×2): via INTRAVENOUS

## 2017-03-25 MED ORDER — GLYCOPYRROLATE 0.2 MG/ML IJ SOLN
INTRAMUSCULAR | Status: DC | PRN
  Administered 2017-03-25: 0.1 mg via INTRAVENOUS

## 2017-03-25 MED ORDER — STERILE WATER FOR IRRIGATION IR SOLN
Status: DC | PRN
  Administered 2017-03-25: 11:00:00

## 2017-03-25 MED ORDER — LACTATED RINGERS IV SOLN: 1000.0000 mL | INTRAVENOUS | Status: DC

## 2017-03-25 MED ORDER — SODIUM CHLORIDE 0.9 % IV SOLN: INTRAVENOUS | Status: DC

## 2017-03-25 MED ORDER — OXYCODONE HCL 5 MG/5ML PO SOLN: 5.0000 mg | Freq: Once | ORAL | Status: DC | PRN

## 2017-03-25 MED ORDER — OXYCODONE HCL 5 MG PO TABS: 5.0000 mg | ORAL_TABLET | Freq: Once | ORAL | Status: DC | PRN

## 2017-03-25 MED ORDER — DEXMEDETOMIDINE HCL 200 MCG/2ML IV SOLN
INTRAVENOUS | Status: DC | PRN
  Administered 2017-03-25: 12 ug via INTRAVENOUS

## 2017-03-25 SURGICAL SUPPLY — 36 items
BALLN DILATOR 10-12 8 (BALLOONS)
BALLN DILATOR 12-15 8 (BALLOONS)
BALLN DILATOR 15-18 8 (BALLOONS)
BALLN DILATOR CRE 0-12 8 (BALLOONS)
BALLN DILATOR ESOPH 8 10 CRE (MISCELLANEOUS) IMPLANT
BALLOON DILATOR 12-15 8 (BALLOONS) IMPLANT
BALLOON DILATOR 15-18 8 (BALLOONS) IMPLANT
BALLOON DILATOR CRE 0-12 8 (BALLOONS) IMPLANT
BLOCK BITE 60FR ADLT L/F GRN (MISCELLANEOUS) ×4 IMPLANT
CANISTER SUCT 1200ML W/VALVE (MISCELLANEOUS) ×4 IMPLANT
CLIP HMST 235XBRD CATH ROT (MISCELLANEOUS) IMPLANT
CLIP RESOLUTION 360 11X235 (MISCELLANEOUS)
ELECT REM PT RETURN 9FT ADLT (ELECTROSURGICAL)
ELECTRODE REM PT RTRN 9FT ADLT (ELECTROSURGICAL) IMPLANT
FCP ESCP3.2XJMB 240X2.8X (MISCELLANEOUS)
FORCEPS BIOP RAD 4 LRG CAP 4 (CUTTING FORCEPS) ×2 IMPLANT
FORCEPS BIOP RJ4 240 W/NDL (MISCELLANEOUS)
FORCEPS ESCP3.2XJMB 240X2.8X (MISCELLANEOUS) IMPLANT
GOWN CVR UNV OPN BCK APRN NK (MISCELLANEOUS) ×4 IMPLANT
GOWN ISOL THUMB LOOP REG UNIV (MISCELLANEOUS) ×8
INJECTOR VARIJECT VIN23 (MISCELLANEOUS) IMPLANT
KIT DEFENDO VALVE AND CONN (KITS) IMPLANT
KIT ENDO PROCEDURE OLY (KITS) ×4 IMPLANT
MARKER SPOT ENDO TATTOO 5ML (MISCELLANEOUS) IMPLANT
PROBE APC STR FIRE (PROBE) IMPLANT
RETRIEVER NET PLAT FOOD (MISCELLANEOUS) IMPLANT
RETRIEVER NET ROTH 2.5X230 LF (MISCELLANEOUS) IMPLANT
SNARE SHORT THROW 13M SML OVAL (MISCELLANEOUS) IMPLANT
SNARE SHORT THROW 30M LRG OVAL (MISCELLANEOUS) IMPLANT
SNARE SNG USE RND 15MM (INSTRUMENTS) IMPLANT
SPOT EX ENDOSCOPIC TATTOO (MISCELLANEOUS)
SYR INFLATION 60ML (SYRINGE) IMPLANT
TRAP ETRAP POLY (MISCELLANEOUS) IMPLANT
VARIJECT INJECTOR VIN23 (MISCELLANEOUS)
WATER STERILE IRR 250ML POUR (IV SOLUTION) ×4 IMPLANT
WIRE CRE 18-20MM 8CM F G (MISCELLANEOUS) IMPLANT

## 2017-03-25 NOTE — Op Note (Signed)
[facility] Gastroenterology Patient Name: Steven Whitney Procedure Date: 03/25/2017 10:52 AM MRN: [id number] Account #: [id number] Date of Birth: 04/22/1960 Admit Type: Outpatient Age: 57 Room: [facility] OR ROOM 01 Gender: Male Note Status: Finalized Procedure:            Colonoscopy Indications:          Screening for colorectal malignant neoplasm Providers:            Charron Coultas B. Bonna Gains MD, MD Referring MD:         [place], MD (Referring MD) Medicines:            Monitored Anesthesia Care Complications:        No immediate complications. Procedure:            Pre-Anesthesia Assessment:                       - ASA Grade Assessment: II - A patient with mild                        systemic disease.                       - Prior to the procedure, a History and Physical was                        performed, and patient medications, allergies and                        sensitivities were reviewed. The patient's tolerance of                        previous anesthesia was reviewed.                       - The risks and benefits of the procedure and the                        sedation options and risks were discussed with the                        patient. All questions were answered and informed                        consent was obtained.                       - Patient identification and proposed procedure were                        verified prior to the procedure by the physician, the                        nurse, the anesthesiologist, the anesthetist and the                        technician. The procedure was verified in the procedure                        room.  After obtaining informed consent, the colonoscope was                        passed under direct vision. Throughout the procedure,                        the patient's blood pressure, pulse, and oxygen                        saturations were monitored  continuously. The [place] [id number]) was introduced through the                        anus and advanced to the the terminal ileum. The                        colonoscopy was performed with ease. The patient                        tolerated the procedure well. The quality of the bowel                        preparation was fair. Findings:      The perianal and digital rectal examinations were normal.      The terminal ileum appeared normal. This was biopsied with a cold       forceps for histology.      The mucosa vascular pattern in the descending colon, in the transverse       colon, in the ascending colon and in the cecum was decreased. This was       biopsied with a cold forceps for histology.      A 4 mm polyp was found in the descending colon. The polyp was sessile.       The polyp was removed with a cold biopsy forceps. Resection and       retrieval were complete.      Patchy mild inflammation characterized by congestion (edema) and       erythema was found in the sigmoid colon. Biopsies were taken with a cold       forceps for histology.      Patchy mild inflammation characterized by congestion (edema) and       erythema was found in the rectum. Biopsies were taken with a cold       forceps for histology.      The retroflexed view of the distal rectum and anal verge was normal and       showed no anal or rectal abnormalities. Impression:           - Preparation of the colon was fair.                       - The examined portion of the ileum was normal.                        Biopsied.                       - Decreased mucosa vascular pattern in the descending  colon, in the transverse colon, in the ascending colon                        and in the cecum. Biopsied.                       - One 4 mm polyp in the descending colon, removed with                        a cold biopsy forceps. Resected and retrieved.                        - Patchy mild inflammation was found in the sigmoid                        colon. Biopsied.                       - Patchy mild inflammation was found in the rectum.                        Biopsied.                       - The distal rectum and anal verge are normal on                        retroflexion view.                       -Above findings may represent IBD, possibly UC Recommendation:       - Discharge patient to home (with escort).                       - Advance diet as tolerated.                       - Continue present medications.                       - Await pathology results.                       - The findings and recommendations were discussed with                        the patient.                       - The findings and recommendations were discussed with                        the patient's family.                       - Return to primary care physician as previously                        scheduled.                       - Return to my office in 2 weeks. Procedure Code(s):    --- Professional ---  45380, Colonoscopy, flexible; with biopsy, single or                        multiple Diagnosis Code(s):    --- Professional ---                       Z12.11, Encounter for screening for malignant neoplasm                        of colon                       D12.4, Benign neoplasm of descending colon                       K52.9, Noninfective gastroenteritis and colitis,                        unspecified                       K62.89, Other specified diseases of anus and rectum CPT copyright 2016 American Medical Association. All rights reserved. The codes documented in this report are preliminary and upon coder review may  be revised to meet current compliance requirements.  [name], MD Margretta Sidle B. Bonna Gains MD, MD 03/25/2017 12:36:00 PM This report has been signed electronically. Number of Addenda: 0 Note Initiated  On: 03/25/2017 10:52 AM Scope Withdrawal Time: 0 hours 31 minutes 37 seconds  Total Procedure Duration: 0 hours 47 minutes 42 seconds  Estimated Blood Loss: Estimated blood loss: none.      [place]

## 2017-03-25 NOTE — Anesthesia Preprocedure Evaluation (Signed)
Anesthesia Evaluation  Patient identified by MRN, date of birth, ID band Patient awake    Reviewed: Allergy & Precautions, H&P , NPO status , Patient's Chart, lab work & pertinent test results, reviewed documented beta blocker date and time   Airway Mallampati: II  TM Distance: >3 FB Neck ROM: full    Dental no notable dental hx.    Pulmonary COPD, former smoker,    Pulmonary exam normal breath sounds clear to auscultation       Cardiovascular Exercise Tolerance: Good negative cardio ROS   Rhythm:regular Rate:Normal     Neuro/Psych  Headaches,  Neuromuscular disease negative psych ROS   GI/Hepatic Neg liver ROS, GERD  ,  Endo/Other  negative endocrine ROS  Renal/GU negative Renal ROS  negative genitourinary   Musculoskeletal   Abdominal   Peds  Hematology negative hematology ROS (+)   Anesthesia Other Findings   Reproductive/Obstetrics negative OB ROS                             Anesthesia Physical Anesthesia Plan  ASA: II  Anesthesia Plan: General   Post-op Pain Management:    Induction:   PONV Risk Score and Plan:   Airway Management Planned:   Additional Equipment:   Intra-op Plan:   Post-operative Plan:   Informed Consent: I have reviewed the patients History and Physical, chart, labs and discussed the procedure including the risks, benefits and alternatives for the proposed anesthesia with the patient or authorized representative who has indicated his/her understanding and acceptance.   Dental Advisory Given  Plan Discussed with: CRNA  Anesthesia Plan Comments:         Anesthesia Quick Evaluation

## 2017-03-25 NOTE — H&P (Signed)
[name], MD [address] [place], [place], [place], [address] [id number], [place], [place], [place], [address] Phone: [phone]  Fax: [phone]  Primary Care Physician:  [facility] [place]   Pre-Procedure History & Physical: HPI:  Steven Whitney is a 57 y.o. male is here for a colonoscopy and EGD.   Past Medical History:  Diagnosis Date  . Arthritis    neck and shoulders  . COPD (chronic obstructive pulmonary disease) (HCC)    LUNG DISEASE   . Fibromyalgia   . GERD (gastroesophageal reflux disease)   . Headache   . Ulcer     Past Surgical History:  Procedure Laterality Date  . LUNG SURGERY  2001  . [place]    Prior to Admission medications   Medication Sig Start Date End Date Taking? Authorizing Provider  LYRICA 225 MG capsule Take 225 mg by mouth 2 (two) times daily. 03/10/17  Yes [provider]  methocarbamol (ROBAXIN) 500 MG tablet  03/10/17  Yes [provider]  Multiple Vitamin (MULTIVITAMIN) capsule Take by mouth.   Yes [provider]  omeprazole (PRILOSEC) 20 MG capsule Take 20 mg by mouth daily.   Yes [provider]  orphenadrine (NORFLEX) 100 MG tablet Take by mouth. 09/23/13   [provider]    Allergies as of 03/18/2017  . (No Known Allergies)    Family History  Problem Relation Age of Onset  . Cancer Unknown   . Ulcerative colitis Unknown     Social History   Socioeconomic History  . Marital status: Married    Spouse name: Not on file  . Number of children: Not on file  . Years of education: Not on file  . Highest education level: Not on file  Social Needs  . Financial resource strain: Not on file  . Food insecurity - worry: Not on file  . Food insecurity - inability: Not on file  . Transportation needs - medical: Not on file  . Transportation needs - non-medical: Not on file  Occupational History  . Not on file  Tobacco Use  . Smoking  status: Former Research scientist (life sciences)  . Smokeless tobacco: Never Used  Substance and Sexual Activity  . Alcohol use: No    Frequency: Never  . Drug use: No  . Sexual activity: Not on file  Other Topics Concern  . Not on file  Social History Narrative  . Not on file    Review of Systems: See HPI, otherwise negative ROS  Physical Exam: BP (!) 146/92   Pulse 67   Temp (!) 97.3 F (36.3 C) (Temporal)   Ht 5' 8"  (1.727 m)   Wt 165 lb (74.8 kg)   SpO2 99%   BMI 25.09 kg/m  General:   Alert,  pleasant and cooperative in NAD Head:  Normocephalic and atraumatic. Neck:  Supple; no masses or thyromegaly. Lungs:  Clear throughout to auscultation, normal respiratory effort.    Heart:  +S1, +S2, Regular rate and rhythm, No edema. Abdomen:  Soft, nontender and nondistended. Normal bowel sounds, without guarding, and without rebound.   Neurologic:  Alert and  oriented x4;  grossly normal neurologically.  Impression/Plan: Steven Whitney is here for a colonoscopy to be performed for average risk screening and EGD for Dysphagia.  Risks, benefits, limitations, and alternatives regarding  colonoscopy have been reviewed with the patient.  Questions have been answered.  All parties agreeable.   Steven Whitney B  Bonna Gains, MD  03/25/2017, 9:55 AM

## 2017-03-25 NOTE — Anesthesia Procedure Notes (Signed)
Procedure Name: MAC Date/Time: 03/25/2017 10:58 AM Performed by: Janna Arch, CRNA Pre-anesthesia Checklist: Patient identified, Emergency Drugs available, Suction available and Patient being monitored Patient Re-evaluated:Patient Re-evaluated prior to induction Oxygen Delivery Method: Nasal cannula

## 2017-03-25 NOTE — Anesthesia Postprocedure Evaluation (Signed)
Anesthesia Post Note  Patient: Steven Whitney  Procedure(s) Performed: COLONOSCOPY WITH PROPOFOL (N/A ) ESOPHAGOGASTRODUODENOSCOPY (EGD) WITH PROPOFOL (N/A ) POLYPECTOMY  Patient location during evaluation: PACU Anesthesia Type: General Level of consciousness: awake and alert Pain management: pain level controlled Vital Signs Assessment: post-procedure vital signs reviewed and stable Respiratory status: spontaneous breathing, nonlabored ventilation, respiratory function stable and patient connected to nasal cannula oxygen Cardiovascular status: blood pressure returned to baseline and stable Postop Assessment: no apparent nausea or vomiting Anesthetic complications: no    SCOURAS, NICOLE ELAINE

## 2017-03-25 NOTE — Transfer of Care (Signed)
Immediate Anesthesia Transfer of Care Note  Patient: Steven Whitney  Procedure(s) Performed: COLONOSCOPY WITH PROPOFOL (N/A ) ESOPHAGOGASTRODUODENOSCOPY (EGD) WITH PROPOFOL (N/A ) POLYPECTOMY  Patient Location: PACU  Anesthesia Type: General  Level of Consciousness: awake, alert  and patient cooperative  Airway and Oxygen Therapy: Patient Spontanous Breathing and Patient connected to supplemental oxygen  Post-op Assessment: Post-op Vital signs reviewed, Patient's Cardiovascular Status Stable, Respiratory Function Stable, Patent Airway and No signs of Nausea or vomiting  Post-op Vital Signs: Reviewed and stable  Complications: No apparent anesthesia complications

## 2017-03-25 NOTE — Op Note (Signed)
[facility] Gastroenterology Patient Name: Steven Whitney Procedure Date: 03/25/2017 10:53 AM MRN: [id number] Account #: [id number] Date of Birth: [birth year] Admit Type: Outpatient Age: 57 Room: [facility] OR ROOM 01 Gender: Male Note Status: Finalized Procedure:            Upper GI endoscopy Indications:          Dysphagia, Diarrhea Providers:            Varnita B. Bonna Gains MD, MD Referring MD:         [place], MD (Referring MD) Medicines:            Monitored Anesthesia Care Complications:        No immediate complications. Procedure:            Pre-Anesthesia Assessment:                       - The risks and benefits of the procedure and the                        sedation options and risks were discussed with the                        patient. All questions were answered and informed                        consent was obtained.                       - Patient identification and proposed procedure were                        verified prior to the procedure.                       - ASA Grade Assessment: II - A patient with mild                        systemic disease.                       After obtaining informed consent, the endoscope was                        passed under direct vision. Throughout the procedure,                        the patient's blood pressure, pulse, and oxygen                        saturations were monitored continuously. The Olympus                        [id number]-HQ190 Endoscope (S#. [id number]) was introduced                        through the mouth, and advanced to the second part of                        duodenum. The upper GI endoscopy was accomplished with  ease. The patient tolerated the procedure well. Findings:      1 small [place] of salmon-colored mucosa were present. Mucosa was       biopsied with a cold forceps for histology.      The Z-line was irregular and was found 40 cm from  the incisors. Biopsies       were taken with a cold forceps for histology.      The exam of the esophagus was otherwise normal.      Biopsies were taken with a cold forceps for histology and to rule out       EoE.      Patchy atrophic mucosa was found in the gastric antrum. Biopsies were       obtained in the gastric body, at the incisura and in the gastric antrum       with cold forceps for histology.      The examined duodenum was normal. Biopsies were obtained in the duodenal       bulb and in the second portion of the duodenum with cold forceps for       evaluation of celiac disease. Impression:           - Salmon-colored mucosa suspicious for Barrett's                        esophagus. Biopsied.                       - Z-line irregular, 40 cm from the incisors. Biopsied.                       - Gastric mucosal atrophy.                       - Normal examined duodenum.                       - Biopsies were taken with a cold forceps for histology.                       - Biopsies were obtained in the gastric body, at the                        incisura and in the gastric antrum.                       - Biopsies were obtained in the duodenal bulb and in                        the second portion of the duodenum. Recommendation:       - Await pathology results.                       - Follow an antireflux regimen.                       - Continue present medications.                       - Return to my office in 2 weeks.                       - The findings and recommendations were discussed  with                        the patient.                       - The findings and recommendations were discussed with                        the patient's family.                       - Return to primary care physician in 4 weeks. Procedure Code(s):    --- Professional ---                       [phone], Esophagogastroduodenoscopy, flexible, transoral;                        with biopsy, single or  multiple Diagnosis Code(s):    --- Professional ---                       K22.8, Other specified diseases of esophagus                       K31.89, Other diseases of stomach and duodenum                       R13.10, Dysphagia, unspecified                       R19.7, Diarrhea, unspecified CPT copyright 2016 American Medical Association. All rights reserved. The codes documented in this report are preliminary and upon coder review may  be revised to meet current compliance requirements.  [name], MD Margretta Sidle B. Bonna Gains MD, MD 03/25/2017 11:29:21 AM This report has been signed electronically. Number of Addenda: 0 Note Initiated On: 03/25/2017 10:53 AM Total Procedure Duration: 0 hours 17 minutes 36 seconds       [facility]

## 2017-03-27 NOTE — Telephone Encounter (Signed)
Pt notified to f/u in 2 wks, to call back and schedule appt and notified that [facility] did not draw CBC as ordered, questioned overlooked. (no reason was given form [facility]) and asked to get this done in the future.

## 2017-03-31 LAB — CBC WITH DIFFERENTIAL/PLATELET
BASOS ABS: 0 10*3/uL (ref 0.0–0.2)
Basos: 1 %
EOS (ABSOLUTE): 0.4 10*3/uL (ref 0.0–0.4)
Eos: 8 %
HEMOGLOBIN: 13.2 g/dL (ref 13.0–17.7)
Hematocrit: 40.1 % (ref 37.5–51.0)
IMMATURE GRANS (ABS): 0 10*3/uL (ref 0.0–0.1)
Immature Granulocytes: 0 %
LYMPHS: 31 %
Lymphocytes Absolute: 1.7 10*3/uL (ref 0.7–3.1)
MCH: 26.5 pg — AB (ref 26.6–33.0)
MCHC: 32.9 g/dL (ref 31.5–35.7)
MCV: 80 fL (ref 79–97)
MONOCYTES: 11 %
Monocytes Absolute: 0.6 10*3/uL (ref 0.1–0.9)
NEUTROS PCT: 49 %
Neutrophils Absolute: 2.7 10*3/uL (ref 1.4–7.0)
PLATELETS: 225 10*3/uL (ref 150–379)
RBC: 4.99 x10E6/uL (ref 4.14–5.80)
RDW: 13.9 % (ref 12.3–15.4)
WBC: 5.4 10*3/uL (ref 3.4–10.8)

## 2017-04-07 ENCOUNTER — Encounter: Payer: Self-pay | Admitting: Gastroenterology

## 2017-04-10 ENCOUNTER — Ambulatory Visit: Payer: BLUE CROSS/BLUE SHIELD | Admitting: Gastroenterology

## 2017-04-10 ENCOUNTER — Other Ambulatory Visit: Payer: Self-pay

## 2017-04-10 ENCOUNTER — Encounter: Payer: Self-pay | Admitting: Gastroenterology

## 2017-04-10 VITALS — BP 131/78 | HR 73 | Ht 68.0 in | Wt 171.6 lb

## 2017-04-10 DIAGNOSIS — K5180 Other ulcerative colitis without complications: Secondary | ICD-10-CM | POA: Diagnosis not present

## 2017-04-10 MED ORDER — MESALAMINE 400 MG PO CPDR
1200.0000 mg | DELAYED_RELEASE_CAPSULE | Freq: Three times a day (TID) | ORAL | 3 refills | Status: DC
Start: 2017-04-10 — End: 2017-05-12

## 2017-04-10 NOTE — Progress Notes (Signed)
Steven Antigua, MD [address]  [place]  [place], [place] [address]  Main: [phone]  Fax: [phone]   Primary Care Physician: [facility], [facility]  Primary Gastroenterologist:  Dr. Vonda Whitney  Chief Complaint  Patient presents with  . Follow-up    Colonoscopy/EGD done 03/25/17    HPI: Steven Whitney is a 57 y.o. male here for follow-up of loose stools, and postprocedure follow-up.  Patient reports 2-46-monthhistory of loose stools, with multiple bowel movements a day.  As of the last 2-3 weeks, he states this is better, and he is now having two soft bowel movements daily.  No abdominal pain at this time.  No nausea or vomiting.  He used to have blood in stool with every bowel movement previously, that started 2 months ago, and this has resolved for the last 2-3 weeks as well.  No fever or chills.  Reports normal appetite.  Is on PPI once daily, and states his heartburn and dysphagia is very well controlled with this.  States he has tried Zantac in the past, and has severe heartburn with it.  States without his once daily PPI he gets severe heartburn.  Patient was initially seen on March 12, 2017 in clinic consultation for diarrhea and dysphagia.  He underwent colonoscopy and EGD on March 25, 2017.  Colonoscopy showed a normal terminal ileum.  Decreased vascular pattern in the cecum, ascending colon, transverse colon and descending colon.  Mild patchy inflammation characterized by edema and erythema in the sigmoid colon and rectum.  A 4 mm descending colon polyp was removed.  EGD showed one small island of salmon colored mucosa, irregular Z line at 40 cm.  Atrophic mucosa in the stomach.  Normal duodenum.  See detailed scanned biopsy report from the procedures.  Biopsies showed evidence of chronicity.  Biopsy results were discussed in detail with the reading pathologist Dr. CMali and he states the features of chronicity are consistent with IBD.   He evaluated both upper endoscopy and lower endoscopy biopsies, and states the findings are most consistent with IBD.  Eosinophils seen on his biopsies, can be seen with IBD, and are unlikely to be due to eosinophilic enterocolitis, and more likely due to IBD.  He also stated that the granulomata seen on his biopsies is an epithelioid granulomata, and by itself should not be used to diagnose Crohn's disease.  He states this type of granulomata can be seen with ulcerative colitis.  Terminal ileum biopsies were normal.   Cecum and ascending colon biopsy: Inactive chronic colitis with increased intraepithelial lymphocytes, negative for dysplasia Transverse colon: Mildly active chronic colitis with epithelioid granulomata and increased intraepithelial lymphocytes negative for dysplasia Descending colon: Moderately active chronic colitis with epithelioid granulomata and increased intraepithelial lymphocytes, negative for dysplasia.  CMV immunostain negative. Sigmoid colon and rectum: Moderately active chronic colitis with increased intraepithelial lymphocytes, negative for dysplasia.  CMV immunostain is negative. Descending colon polyp: Inflammatory pseudopolyp, negative for dysplasia.  GE junction biopsy: Squamous and gastric type mucosa with reflux type changes.  Negative for intestinal metaplasia, negative for eosinophilic esophagitis Esophageal biopsy: Negative for eosinophilic esophagitis.  Reactive squamous mucosa. Duodenal biopsy: Small bowel mucosa with no significant pathologic changes.  Negative for features of celiac disease.  Giardia lamblia organisms not present. Stomach biopsies: Chronic inactive gastritis with increased intramucosal eosinophils.  Negative for Helicobacter pylori organism and intestinal metaplasia on Helicobacter stain.  Current Outpatient Medications  Medication Sig Dispense Refill  . LYRICA 225  MG capsule Take 225 mg by mouth 2 (two) times daily.  0  . methocarbamol  (ROBAXIN) 500 MG tablet   0  . Multiple Vitamin (MULTIVITAMIN) capsule Take by mouth.    Marland Kitchen omeprazole (PRILOSEC) 20 MG capsule Take 20 mg by mouth daily.    . orphenadrine (NORFLEX) 100 MG tablet Take by mouth.    . Mesalamine (ASACOL) 400 MG CPDR DR capsule Take 3 capsules (1,200 mg total) by mouth 3 (three) times daily. 270 capsule 3   No current facility-administered medications for this visit.     Allergies as of 04/10/2017  . (No Known Allergies)    ROS:  General: Negative for anorexia, weight loss, fever, chills, fatigue, weakness. ENT: Negative for hoarseness, difficulty swallowing , nasal congestion. CV: Negative for chest pain, angina, palpitations, dyspnea on exertion, peripheral edema.  Respiratory: Negative for dyspnea at rest, dyspnea on exertion, cough, sputum, wheezing.  GI: See history of present illness. GU:  Negative for dysuria, hematuria, urinary incontinence, urinary frequency, nocturnal urination.  Endo: Negative for unusual weight change.    Physical Examination:   BP 131/78   Pulse 73   Ht 5' 8"  (1.727 m)   Wt 171 lb 9.6 oz (77.8 kg)   BMI 26.09 kg/m   General: Well-nourished, well-developed in no acute distress.  Eyes: No icterus. Conjunctivae pink. Mouth: Oropharyngeal mucosa moist and pink , no lesions erythema or exudate. Neck: Supple, Trachea midline Abdomen: Bowel sounds are normal, nontender, nondistended, no hepatosplenomegaly or masses, no abdominal bruits or hernia , no rebound or guarding.   Extremities: No lower extremity edema. No clubbing or deformities. Neuro: Alert and oriented x 3.  Grossly intact. Skin: Warm and dry, no jaundice.   Psych: Alert and cooperative, normal mood and affect.   Labs: CMP     Component Value Date/Time   NA 141 03/19/2017 0938   K 4.3 03/19/2017 0938   CL 105 03/19/2017 0938   CO2 24 03/19/2017 0938   GLUCOSE 92 03/19/2017 0938   BUN 14 03/19/2017 0938   CREATININE 0.89 03/19/2017 0938   CALCIUM  9.3 03/19/2017 0938   PROT 6.6 03/19/2017 0938   ALBUMIN 3.9 03/19/2017 0938   AST 15 03/19/2017 0938   ALT 15 03/19/2017 0938   ALKPHOS 59 03/19/2017 0938   BILITOT 0.4 03/19/2017 0938   GFRNONAA 95 03/19/2017 0938   GFRAA 110 03/19/2017 0938   Lab Results  Component Value Date   WBC 5.4 03/31/2017   HGB 13.2 03/31/2017   HCT 40.1 03/31/2017   MCV 80 03/31/2017   PLT 225 03/31/2017    Imaging Studies: No results found.  Assessment and Plan:   Steven Whitney is a 57 y.o. y/o male initially seen in January 2019 for complaints of 2-66-monthhistory of loose stools, with colonoscopy and EGD done on February 2019 showing evidence of ulcerative colitis endoscopically and on biopsies  After careful review of biopsy results with the pathologist, and reviewing clinical symptoms, and clinical history with the patient in detail, findings are consistent with ulcerative colitis Symptoms are mild at this time Will obtain labs including inflammatory markers, since he does not have inflammatory markers from before We will start mesalamine 1200 mg 3 times a day Will assess for improvement in symptoms with the dose, and decrease dose in the future to maintain remission We will also check TB Gold QuantiFERON, hep B, TPM T level, in case step up therapy as needed in the future Patient was  asked to get a flu shot He was also asked to obtain a pneumonia vaccination He was asked to get this done at his primary care office, as we do not have this available here at this time  Patient states that his dysphagia has completely resolved He says his heartburn is very well controlled with once daily PPI We discussed decreasing the dose or taking the medication off, and using Zantac instead.  He states with Zantac his symptoms are severe, and once a day PPI has controlled her symptoms for years.  He does not want to decrease it at this time. ((Risks of PPI use were discussed with patient including bone  loss, C. Diff diarrhea, pneumonia, infections, CKD, electrolyte abnormalities. Pt. Verbalizes understanding and chooses to continue the medication.)  Acid reflux lifestyle modifications discussed with him in detail, he was asked to buy a bed wedge, and this may help decrease the medication in the future.   Dr Steven Whitney

## 2017-04-10 NOTE — Patient Instructions (Signed)
F/u 1 week

## 2017-04-19 LAB — BASIC METABOLIC PANEL
BUN/Creatinine Ratio: 16 (ref 9–20)
BUN: 13 mg/dL (ref 6–24)
CALCIUM: 9.5 mg/dL (ref 8.7–10.2)
CHLORIDE: 106 mmol/L (ref 96–106)
CO2: 24 mmol/L (ref 20–29)
Creatinine, Ser: 0.82 mg/dL (ref 0.76–1.27)
GFR calc non Af Amer: 98 mL/min/{1.73_m2} (ref >59)
GFR, EST AFRICAN AMERICAN: 113 mL/min/{1.73_m2} (ref >59)
Glucose: 97 mg/dL (ref 65–99)
POTASSIUM: 4.5 mmol/L (ref 3.5–5.2)
Sodium: 143 mmol/L (ref 134–144)

## 2017-04-19 LAB — CBC WITH DIFFERENTIAL/PLATELET
Basophils Absolute: 0.1 10*3/uL (ref 0.0–0.2)
Basos: 1 %
EOS (ABSOLUTE): 0.3 10*3/uL (ref 0.0–0.4)
Eos: 5 %
Hematocrit: 38.3 % (ref 37.5–51.0)
Hemoglobin: 13 g/dL (ref 13.0–17.7)
IMMATURE GRANS (ABS): 0 10*3/uL (ref 0.0–0.1)
IMMATURE GRANULOCYTES: 0 %
LYMPHS: 38 %
Lymphocytes Absolute: 2 10*3/uL (ref 0.7–3.1)
MCH: 26.6 pg (ref 26.6–33.0)
MCHC: 33.9 g/dL (ref 31.5–35.7)
MCV: 79 fL (ref 79–97)
MONOS ABS: 0.5 10*3/uL (ref 0.1–0.9)
Monocytes: 9 %
NEUTROS PCT: 47 %
Neutrophils Absolute: 2.4 10*3/uL (ref 1.4–7.0)
Platelets: 227 10*3/uL (ref 150–379)
RBC: 4.88 x10E6/uL (ref 4.14–5.80)
RDW: 13.6 % (ref 12.3–15.4)
WBC: 5.3 10*3/uL (ref 3.4–10.8)

## 2017-04-19 LAB — HCV COMMENT:

## 2017-04-19 LAB — QUANTIFERON-TB GOLD PLUS
QUANTIFERON NIL VALUE: 0.04 [IU]/mL
QUANTIFERON-TB GOLD PLUS: NEGATIVE
QuantiFERON TB1 Ag Value: 0.02 IU/mL
QuantiFERON TB2 Ag Value: 0.03 IU/mL

## 2017-04-19 LAB — HEPATITIS C ANTIBODY (REFLEX)

## 2017-04-19 LAB — THIOPURINE METHYLTRANSFERASE (TPMT), RBC: TPMT Activity:: 23.3 Units/mL RBC

## 2017-04-19 LAB — HEPATITIS B SURFACE ANTIGEN: Hepatitis B Surface Ag: NEGATIVE

## 2017-04-19 LAB — HEPATITIS A ANTIBODY, TOTAL: HEP A TOTAL AB: NEGATIVE

## 2017-04-19 LAB — HEPATITIS B CORE ANTIBODY, IGM: HEP B C IGM: NEGATIVE

## 2017-04-19 LAB — HEPATITIS B SURFACE ANTIBODY,QUALITATIVE: Hep B Surface Ab, Qual: NONREACTIVE

## 2017-04-19 LAB — SEDIMENTATION RATE: SED RATE: 4 mm/h (ref 0–30)

## 2017-04-19 LAB — HEPATITIS B CORE ANTIBODY, TOTAL: HEP B C TOTAL AB: NEGATIVE

## 2017-04-19 LAB — C-REACTIVE PROTEIN: CRP: 1 mg/L (ref 0.0–4.9)

## 2017-04-30 ENCOUNTER — Telehealth: Payer: Self-pay | Admitting: Gastroenterology

## 2017-04-30 DIAGNOSIS — K5180 Other ulcerative colitis without complications: Secondary | ICD-10-CM

## 2017-04-30 NOTE — Telephone Encounter (Signed)
Pt  Wife is calling stating Dr. Marlyne Beards order blood work for pt and gave him rx and wanted pt to do blood work again after rx started she is checking to see if the orders are in for those additional labs please call pt cb [phone]

## 2017-05-01 NOTE — Telephone Encounter (Signed)
Patient's wife called again.

## 2017-05-02 NOTE — Telephone Encounter (Signed)
Pt wife called again she was upset for not getting a response, she would like to know if husband labs are ordered for him to get the labs done

## 2017-05-06 ENCOUNTER — Other Ambulatory Visit: Payer: Self-pay

## 2017-05-06 DIAGNOSIS — K5180 Other ulcerative colitis without complications: Secondary | ICD-10-CM

## 2017-05-06 NOTE — Telephone Encounter (Signed)
Left message that lab could be done this week or next week. To contact office for any questions.

## 2017-05-06 NOTE — Telephone Encounter (Signed)
I notified wife of pt lab results and she was upset because she had not received response. I apologized and I told her for some reason the labs did not go to your box, that sometimes we have computer glitches. I was not sure as to when you wanted pt to have this done and I would let her know.  Thanks.

## 2017-05-12 ENCOUNTER — Ambulatory Visit: Payer: BLUE CROSS/BLUE SHIELD | Admitting: Gastroenterology

## 2017-05-12 ENCOUNTER — Encounter: Payer: Self-pay | Admitting: Gastroenterology

## 2017-05-12 ENCOUNTER — Encounter (INDEPENDENT_AMBULATORY_CARE_PROVIDER_SITE_OTHER): Payer: Self-pay

## 2017-05-12 ENCOUNTER — Other Ambulatory Visit: Payer: Self-pay

## 2017-05-12 DIAGNOSIS — K5180 Other ulcerative colitis without complications: Secondary | ICD-10-CM

## 2017-05-12 MED ORDER — MESALAMINE 400 MG PO CPDR
1200.0000 mg | DELAYED_RELEASE_CAPSULE | Freq: Three times a day (TID) | ORAL | 3 refills | Status: DC
Start: 2017-05-12 — End: 2017-09-01

## 2017-05-12 NOTE — Patient Instructions (Signed)
F/U 1 month Ulcerative Colitis, Adult Ulcerative colitis is long-lasting (chronic) swelling (inflammation) of the large intestine (colon). Sores (ulcers) may also form on the colon. Ulcerative colitis is closely related to another condition of inflammation of the intestines that is called Crohn disease. Together, they are frequently referred to as inflammatory bowel disease (IBD). What are the causes? Ulcerative colitis is caused by increased activity of the immune system in the intestines. The immune system is the system that protects the body against harmful bacteria, viruses, fungi, and other things that can make you sick. When the immune system overacts, it causes inflammation. The cause of the increased immune system activity is not known. What increases the risk? Risk factors of ulcerative colitis include:  Age. This includes: ? Being 2-48 years old. ? Being older than 58 years old.  Having a family history of ulcerative colitis.  Being of Jewish descent.  What are the signs or symptoms? Common symptoms of ulcerative colitis include rectal bleeding and diarrhea. There is a wide range of symptoms, and a person's symptoms depend on how severe the condition is. Additional symptoms may include:  Pain or cramping in the belly (abdomen).  Fever.  Fatigue.  Weight loss.  Night sweats.  Rectal pain.  Feeling the immediate need to have a bowel movement.  Nausea.  Loss of appetite.  Anemia.  Joint pain or soreness.  Eye irritation.  Certain skin rashes.  How is this diagnosed? Ulcerative colitis may be diagnosed by:  Medical history and physical exam.  Blood tests and stool tests.  X-rays.  CT scans.  Colonoscopy. For this test, a flexible tube is inserted into your anus and your colon is examined.  Examination of a tissue sample from your colon (biopsy).  How is this treated? Treatment for ulcerative colitis may include medicines to:  Decrease  inflammation.  Control your immune system.  Surgery may also be necessary. Follow these instructions at home: Medicines and vitamins  Take medicines only as directed by your doctor. Do not take aspirin.  Ask your doctor if you should take any vitamins or supplements. Lifestyle  Exercise regularly.  Limit alcohol intake to no more than 1 drink per day for nonpregnant women and 2 drinks per day for men. One drink equals 12 ounces of beer, 5 ounces of wine, or 1 ounces of hard liquor. Eating and drinking  Drink enough fluid to keep your urine clear or pale yellow.  Ask your health care provider about the best diet for you. Follow the diet as directed by your health care provider. This may include: ? Avoiding carbonated drinks. ? Avoiding popcorn, vegetable skins, nuts, and other high-fiber foods when you have symptoms of ulcerative colitis. ? Eating smaller meals more often. ? Keeping a food diary. This may help you to find and avoid any foods that make you feel not well.  Limit your caffeine intake. General instructions  Keep all follow-up appointments as directed by your health care provider. This is important. Contact a health care provider if:  Your symptoms do not improve or get worse with treatment.  You continue to lose weight.  You have constant cramps or loose bowels.  You develop a new skin rash, skin sores, or eye problems.  You have a fever or chills. Get help right away if:  You have bloody diarrhea.  You have severe pain in your abdomen.  You vomit. This information is not intended to replace advice given to you by your health care  provider. Make sure you discuss any questions you have with your health care provider. Document Released: 11/07/2004 Document Revised: 10/01/2015 Document Reviewed: 05/23/2014 Elsevier Interactive Patient Education  [employer].

## 2017-05-14 LAB — COMPREHENSIVE METABOLIC PANEL
ALT: 18 IU/L (ref 0–44)
AST: 13 IU/L (ref 0–40)
Albumin/Globulin Ratio: 1.7 (ref 1.2–2.2)
Albumin: 4.5 g/dL (ref 3.5–5.5)
Alkaline Phosphatase: 59 IU/L (ref 39–117)
BUN / CREAT RATIO: 22 — AB (ref 9–20)
BUN: 17 mg/dL (ref 6–24)
Bilirubin Total: 0.2 mg/dL (ref 0.0–1.2)
CALCIUM: 9.4 mg/dL (ref 8.7–10.2)
CO2: 22 mmol/L (ref 20–29)
Chloride: 106 mmol/L (ref 96–106)
Creatinine, Ser: 0.78 mg/dL (ref 0.76–1.27)
GFR, EST AFRICAN AMERICAN: 116 mL/min/{1.73_m2} (ref >59)
GFR, EST NON AFRICAN AMERICAN: 100 mL/min/{1.73_m2} (ref >59)
GLOBULIN, TOTAL: 2.6 g/dL (ref 1.5–4.5)
Glucose: 89 mg/dL (ref 65–99)
POTASSIUM: 4.4 mmol/L (ref 3.5–5.2)
SODIUM: 142 mmol/L (ref 134–144)
TOTAL PROTEIN: 7.1 g/dL (ref 6.0–8.5)

## 2017-05-14 LAB — CBC WITH DIFFERENTIAL/PLATELET
BASOS: 0 %
Basophils Absolute: 0 10*3/uL (ref 0.0–0.2)
EOS (ABSOLUTE): 0.2 10*3/uL (ref 0.0–0.4)
EOS: 4 %
HEMATOCRIT: 40.8 % (ref 37.5–51.0)
Hemoglobin: 13.5 g/dL (ref 13.0–17.7)
IMMATURE GRANS (ABS): 0 10*3/uL (ref 0.0–0.1)
IMMATURE GRANULOCYTES: 0 %
Lymphocytes Absolute: 1.9 10*3/uL (ref 0.7–3.1)
Lymphs: 34 %
MCH: 26.5 pg — ABNORMAL LOW (ref 26.6–33.0)
MCHC: 33.1 g/dL (ref 31.5–35.7)
MCV: 80 fL (ref 79–97)
MONOS ABS: 0.4 10*3/uL (ref 0.1–0.9)
Monocytes: 8 %
NEUTROS ABS: 3 10*3/uL (ref 1.4–7.0)
NEUTROS PCT: 54 %
Platelets: 211 10*3/uL (ref 150–379)
RBC: 5.1 x10E6/uL (ref 4.14–5.80)
RDW: 13.5 % (ref 12.3–15.4)
WBC: 5.5 10*3/uL (ref 3.4–10.8)

## 2017-05-14 LAB — QUANTIFERON-TB GOLD PLUS
QUANTIFERON NIL VALUE: 0.02 [IU]/mL
QUANTIFERON TB1 AG VALUE: 0.01 [IU]/mL
QUANTIFERON-TB GOLD PLUS: NEGATIVE
QuantiFERON TB2 Ag Value: 0.02 IU/mL

## 2017-05-14 LAB — C-REACTIVE PROTEIN: CRP: 0.6 mg/L (ref 0.0–4.9)

## 2017-05-14 LAB — HEPATITIS B SURFACE ANTIGEN: Hepatitis B Surface Ag: NEGATIVE

## 2017-05-14 LAB — HEPATITIS A ANTIBODY, TOTAL: HEP A TOTAL AB: NEGATIVE

## 2017-05-14 LAB — THIOPURINE METHYLTRANSFERASE (TPMT), RBC: TPMT Activity:: 20.5 Units/mL RBC

## 2017-05-14 LAB — SEDIMENTATION RATE: SED RATE: 12 mm/h (ref 0–30)

## 2017-05-14 LAB — HCV COMMENT:

## 2017-05-14 LAB — HEPATITIS B CORE ANTIBODY, IGM: HEP B C IGM: NEGATIVE

## 2017-05-14 LAB — HEPATITIS C ANTIBODY (REFLEX)

## 2017-05-14 LAB — HEPATITIS B SURFACE ANTIBODY,QUALITATIVE: Hep B Surface Ab, Qual: NONREACTIVE

## 2017-05-16 NOTE — Progress Notes (Signed)
[name], MD [address]  [place]  [place], [place] [address]  Main: [phone]  Fax: [phone]   Primary Care Physician: [facility], [facility]  Primary Gastroenterologist:  Dr. [name]  Chief Complaint  Patient presents with  . Follow-up    1 month loose stools. Currently no loose stools    HPI: Steven Whitney is a 57 y.o. male here for follow-up of ulcerative colitis.  Patient was initially seen for loose stools ongoing for 2-3 months.  Underwent a colonoscopy with biopsies consistent with ulcerative colitis.  Has been on Asacol 1200 mg 3 times daily.  Now is reporting 1-2 formed bowel once a day.  And with no blood.  States when the symptoms started, he was having blood in stools every day.  No abdominal pain.  Normal appetite.  No weight loss.  No nausea or vomiting.  No fever or chills.  Previous history: Patient was initially seen on March 12, 2017 in clinic consultation for diarrhea and dysphagia.  He underwent colonoscopy and EGD on March 25, 2017.  Colonoscopy showed a normal terminal ileum.  Decreased vascular pattern in the cecum, ascending colon, transverse colon and descending colon.  Mild patchy inflammation characterized by edema and erythema in the sigmoid colon and rectum.  A 4 mm descending colon polyp was removed.  EGD showed one small island of salmon colored mucosa, irregular Z line at 40 cm.  Atrophic mucosa in the stomach.  Normal duodenum.  See detailed scanned biopsy report from the procedures.  Biopsies showed evidence of chronicity.  Biopsy results were discussed in detail with the reading pathologist Dr. Mali, and he states the features of chronicity are consistent with IBD.  He evaluated both upper endoscopy and lower endoscopy biopsies, and states the findings are most consistent with IBD.  Eosinophils seen on his biopsies, can be seen with IBD, and are unlikely to be due to eosinophilic enterocolitis,  and more likely due to IBD.  He also stated that the granulomata seen on his biopsies is an epithelioid granulomata, and by itself should not be used to diagnose Crohn's disease.  He states this type of granulomata can be seen with ulcerative colitis.  Terminal ileum biopsies were normal.   Cecum and ascending colon biopsy: Inactive chronic colitis with increased intraepithelial lymphocytes, negative for dysplasia Transverse colon: Mildly active chronic colitis with epithelioid granulomata and increased intraepithelial lymphocytes negative for dysplasia Descending colon: Moderately active chronic colitis with epithelioid granulomata and increased intraepithelial lymphocytes, negative for dysplasia.  CMV immunostain negative. Sigmoid colon and rectum: Moderately active chronic colitis with increased intraepithelial lymphocytes, negative for dysplasia.  CMV immunostain is negative. Descending colon polyp: Inflammatory pseudopolyp, negative for dysplasia.  GE junction biopsy: Squamous and gastric type mucosa with reflux type changes.  Negative for intestinal metaplasia, negative for eosinophilic esophagitis Esophageal biopsy: Negative for eosinophilic esophagitis.  Reactive squamous mucosa. Duodenal biopsy: Small bowel mucosa with no significant pathologic changes.  Negative for features of celiac disease.  Giardia lamblia organisms not present. Stomach biopsies: Chronic inactive gastritis with increased intramucosal eosinophils.  Negative for Helicobacter pylori organism and intestinal metaplasia on Helicobacter stain.     Current Outpatient Medications  Medication Sig Dispense Refill  . LYRICA 225 MG capsule Take 225 mg by mouth 2 (two) times daily.  0  . Mesalamine (ASACOL) 400 MG CPDR DR capsule Take 3 capsules (1,200 mg total) by mouth 3 (three) times daily. 270 capsule 3  . Multiple Vitamin (  MULTIVITAMIN) capsule Take by mouth.    Marland Kitchen omeprazole (PRILOSEC) 20 MG capsule Take 20 mg by mouth  daily.    . methocarbamol (ROBAXIN) 500 MG tablet   0  . orphenadrine (NORFLEX) 100 MG tablet Take by mouth.     No current facility-administered medications for this visit.     Allergies as of 05/12/2017  . (No Known Allergies)    ROS:  General: Negative for anorexia, weight loss, fever, chills, fatigue, weakness. ENT: Negative for hoarseness, difficulty swallowing , nasal congestion. CV: Negative for chest pain, angina, palpitations, dyspnea on exertion, peripheral edema.  Respiratory: Negative for dyspnea at rest, dyspnea on exertion, cough, sputum, wheezing.  GI: See history of present illness. GU:  Negative for dysuria, hematuria, urinary incontinence, urinary frequency, nocturnal urination.  Endo: Negative for unusual weight change.    Physical Examination:   BP 123/79   Pulse 67   Temp 98.1 F (36.7 C) (Oral)   Ht 5' 8"  (1.727 m)   Wt 170 lb 9.6 oz (77.4 kg)   BMI 25.94 kg/m   General: Well-nourished, well-developed in no acute distress.  Eyes: No icterus. Conjunctivae pink. Mouth: Oropharyngeal mucosa moist and pink , no lesions erythema or exudate. Neck: Supple, Trachea midline Abdomen: Bowel sounds are normal, nontender, nondistended, no hepatosplenomegaly or masses, no abdominal bruits or hernia , no rebound or guarding.   Extremities: No lower extremity edema. No clubbing or deformities. Neuro: Alert and oriented x 3.  Grossly intact. Skin: Warm and dry, no jaundice.   Psych: Alert and cooperative, normal mood and affect.   Labs: CMP     Component Value Date/Time   NA 142 05/09/2017 1045   K 4.4 05/09/2017 1045   CL 106 05/09/2017 1045   CO2 22 05/09/2017 1045   GLUCOSE 89 05/09/2017 1045   BUN 17 05/09/2017 1045   CREATININE 0.78 05/09/2017 1045   CALCIUM 9.4 05/09/2017 1045   PROT 7.1 05/09/2017 1045   ALBUMIN 4.5 05/09/2017 1045   AST 13 05/09/2017 1045   ALT 18 05/09/2017 1045   ALKPHOS 59 05/09/2017 1045   BILITOT 0.2 05/09/2017 1045    GFRNONAA 100 05/09/2017 1045   GFRAA 116 05/09/2017 1045   Lab Results  Component Value Date   WBC 5.5 05/09/2017   HGB 13.5 05/09/2017   HCT 40.8 05/09/2017   MCV 80 05/09/2017   PLT 211 05/09/2017    Imaging Studies: No results found.  Assessment and Plan:   Steven Whitney is a 57 y.o. y/o male here for follow-up of ulcerative colitis, pancolitis based on biopsies  Currently in clinical remission on Asacol 1200 mg 3 times a day That dose was started on April 10, 2017.  Will continue on current dose for another 1-2 months, and then decrease dosing Kidney function was checked on May 09, 2017, and no changes in creatinine which is good  Patient was asked to get a flu shot, and pneumonia vaccination He is refusing to get this.  Recommendation to get these vaccination in patients with IBD was discussed with him.  He understands the risks of getting infections, and that he is at high risk of getting these due to his chronic disease.  He verbalized understanding and does not want to get them.  No extraintestinal manifestations present at this time  He is on PPI, as he had dysphagia and heartburn while only taking Zantac.  We have tried to ask him to decrease and eventually take PPI off.  He would like to continue as symptoms were uncontrolled prior to that.  I have discussed trying to decrease dose, or go to every other day dosing.  However, he is refusing (Risks of PPI use were discussed with patient including bone loss, C. Diff diarrhea, pneumonia, infections, CKD, electrolyte abnormalities.  If clinically possible based on symptoms, goal would be to maintain patient on the lowest dose possible, or discontinue the medication with institution of acid reflux lifestyle modifications over time. Pt. Verbalizes understanding and chooses to continue the medication.)     Dr [name]

## 2017-05-28 ENCOUNTER — Ambulatory Visit: Payer: BLUE CROSS/BLUE SHIELD | Admitting: Gastroenterology

## 2017-05-29 ENCOUNTER — Ambulatory Visit: Payer: BLUE CROSS/BLUE SHIELD | Admitting: Gastroenterology

## 2017-06-30 DIAGNOSIS — M75101 Unspecified rotator cuff tear or rupture of right shoulder, not specified as traumatic: Secondary | ICD-10-CM | POA: Insufficient documentation

## 2017-06-30 DIAGNOSIS — M7581 Other shoulder lesions, right shoulder: Secondary | ICD-10-CM | POA: Insufficient documentation

## 2017-09-01 ENCOUNTER — Telehealth: Payer: Self-pay | Admitting: Gastroenterology

## 2017-09-01 ENCOUNTER — Other Ambulatory Visit: Payer: Self-pay

## 2017-09-01 DIAGNOSIS — K5180 Other ulcerative colitis without complications: Secondary | ICD-10-CM

## 2017-09-01 MED ORDER — MESALAMINE 400 MG PO CPDR: 1200.0000 mg | DELAYED_RELEASE_CAPSULE | Freq: Three times a day (TID) | ORAL | 3 refills | Status: DC

## 2017-09-01 NOTE — Telephone Encounter (Signed)
*  STAT* If patient is at the pharmacy, call can be transferred to refill team.   1. Which medications need to be refilled? (please list name of each medication and dose if known) Delzicol 400 mg  2. Which pharmacy/location (including street and city if local pharmacy) is medication to be sent to? [place]  3. Do they need a 30 day or 90 day supply? 90 day

## 2017-09-01 NOTE — Telephone Encounter (Signed)
Pt's next appt is 8/20 with you. Refill?

## 2017-09-30 ENCOUNTER — Encounter: Payer: Self-pay | Admitting: Gastroenterology

## 2017-09-30 ENCOUNTER — Ambulatory Visit: Payer: BLUE CROSS/BLUE SHIELD | Admitting: Gastroenterology

## 2017-09-30 VITALS — BP 112/69 | HR 71 | Ht 68.0 in | Wt 172.6 lb

## 2017-09-30 DIAGNOSIS — R197 Diarrhea, unspecified: Secondary | ICD-10-CM | POA: Diagnosis not present

## 2017-09-30 DIAGNOSIS — K5100 Ulcerative (chronic) pancolitis without complications: Secondary | ICD-10-CM | POA: Diagnosis not present

## 2017-09-30 DIAGNOSIS — K5180 Other ulcerative colitis without complications: Secondary | ICD-10-CM

## 2017-09-30 MED ORDER — MESALAMINE 400 MG PO CPDR: 1600.0000 mg | DELAYED_RELEASE_CAPSULE | Freq: Three times a day (TID) | ORAL | 3 refills | Status: DC

## 2017-09-30 NOTE — Patient Instructions (Signed)
F/U 1 month Please contact office in 1 week and let us know how you are doing. [facility] for stool testing.

## 2017-09-30 NOTE — Progress Notes (Signed)
[name], MD [address]  [place]  [place], [place] [address]  Main: [phone]  Fax: [phone]   Primary Care Physician: [facility], [facility]  Primary Gastroenterologist:  Dr. [name]  Chief Complaint  Patient presents with  . Follow-up    Ulcerative Colitis  . Diarrhea  . Abdominal Pain    Left side  . Burning Sensation    Abdominal area    HPI: Steven Whitney is a 57 y.o. male here for follow-up of ulcerative pancolitis.  Reports diarrhea for the last 1 month.  Reports 4-6 or more loose bowel movements a day.  Also reports blood streaks with stool with every bowel movement for the last 1 month.  No fever or chills.  Also reports abdominal cramping right before bowel movements and urgency.  Denies any new medications or recent antibiotics.  Does report some foul-smelling stool recently.  No sick contacts or recent travel.  No nausea or vomiting.  Initial diagnosis: March 25, 2017 via colonoscopy Current therapy: Asacol 1200 mg 3 times a day  Previous history: Patient was initially seen on March 12, 2017 in clinic consultation for diarrhea and dysphagia. He underwent colonoscopy and EGD on March 25, 2017.  Colonoscopy showed a normal terminal ileum. Decreased vascular pattern in the cecum, ascending colon, transverse colon and descending colon. Mild patchy inflammation characterized by edema and erythema in the sigmoid colon and rectum. A 4 mm descending colon polyp was removed.  EGD showed one small island of salmon colored mucosa, irregular Z line at 40 cm.Atrophic mucosa in the stomach. Normal duodenum.  See detailed scanned biopsy report from the procedures. Biopsies showed evidence of chronicity. Biopsy results were discussed in detail with the reading pathologist Dr. Mali, and he states the features of chronicity are consistent with IBD. He evaluated both upper endoscopy and lower endoscopy biopsies, and  states the findings are most consistent with IBD. Eosinophils seen on his biopsies, can be seen with IBD, and are unlikely to be due to eosinophilic enterocolitis, and more likely due to IBD. He also stated that the granulomata seen on his biopsies is an epithelioid granulomata, and by itself should not be used to diagnose Crohn's disease.He states this type of granulomata can be seen with ulcerative colitis.  Terminal ileum biopsies were normal.  Cecum and ascending colon biopsy: Inactive chronic colitis with increased intraepithelial lymphocytes, negative for dysplasia Transverse colon: Mildly active chronic colitis with epithelioid granulomata and increased intraepithelial lymphocytes negative for dysplasia Descending colon: Moderately active chronic colitis with epithelioid granulomata and increased intraepithelial lymphocytes, negative for dysplasia. CMV immunostain negative. Sigmoid colon and rectum: Moderately active chronic colitis with increased intraepithelial lymphocytes, negative for dysplasia. CMV immunostain is negative. Descending colon polyp: Inflammatory pseudopolyp, negative for dysplasia.  GE junction biopsy: Squamous and gastric type mucosa with reflux type changes. Negative for intestinal metaplasia, negative for eosinophilic esophagitis Esophageal biopsy: Negative for eosinophilic esophagitis. Reactive squamous mucosa. Duodenal biopsy: Small bowel mucosa with no significant pathologic changes. Negative for features of celiac disease. Giardia lamblia organisms not present. Stomach biopsies: Chronic inactive gastritis with increased intramucosal eosinophils. Negative for Helicobacter pylori organism and intestinal metaplasia on Helicobacter stain.   Current Outpatient Medications  Medication Sig Dispense Refill  . LYRICA 225 MG capsule Take 225 mg by mouth 2 (two) times daily.  0  . Mesalamine (ASACOL) 400 MG CPDR DR capsule Take 3 capsules (1,200 mg total) by  mouth 3 (three) times daily. 270 capsule 3  .  omeprazole (PRILOSEC) 20 MG capsule Take 20 mg by mouth daily.    . methocarbamol (ROBAXIN) 500 MG tablet   0   No current facility-administered medications for this visit.     Allergies as of 09/30/2017  . (No Known Allergies)    ROS:  General: Negative for anorexia, weight loss, fever, chills, fatigue, weakness. ENT: Negative for hoarseness, difficulty swallowing , nasal congestion. CV: Negative for chest pain, angina, palpitations, dyspnea on exertion, peripheral edema.  Respiratory: Negative for dyspnea at rest, dyspnea on exertion, cough, sputum, wheezing.  GI: See history of present illness. GU:  Negative for dysuria, hematuria, urinary incontinence, urinary frequency, nocturnal urination.  Endo: Negative for unusual weight change.    Physical Examination:   BP 112/69   Pulse 71   Ht 5' 8"  (1.727 m)   Wt 172 lb 9.6 oz (78.3 kg)   BMI 26.24 kg/m   General: Well-nourished, well-developed in no acute distress.  Eyes: No icterus. Conjunctivae pink. Mouth: Oropharyngeal mucosa moist and pink , no lesions erythema or exudate. Neck: Supple, Trachea midline Abdomen: Bowel sounds are normal, nontender, nondistended, no hepatosplenomegaly or masses, no abdominal bruits or hernia , no rebound or guarding.   Extremities: No lower extremity edema. No clubbing or deformities. Neuro: Alert and oriented x 3.  Grossly intact. Skin: Warm and dry, no jaundice.   Psych: Alert and cooperative, normal mood and affect.   Labs: CMP     Component Value Date/Time   NA 142 05/09/2017 1045   K 4.4 05/09/2017 1045   CL 106 05/09/2017 1045   CO2 22 05/09/2017 1045   GLUCOSE 89 05/09/2017 1045   BUN 17 05/09/2017 1045   CREATININE 0.78 05/09/2017 1045   CALCIUM 9.4 05/09/2017 1045   PROT 7.1 05/09/2017 1045   ALBUMIN 4.5 05/09/2017 1045   AST 13 05/09/2017 1045   ALT 18 05/09/2017 1045   ALKPHOS 59 05/09/2017 1045   BILITOT 0.2 05/09/2017  1045   GFRNONAA 100 05/09/2017 1045   GFRAA 116 05/09/2017 1045   Lab Results  Component Value Date   WBC 5.5 05/09/2017   HGB 13.5 05/09/2017   HCT 40.8 05/09/2017   MCV 80 05/09/2017   PLT 211 05/09/2017    Imaging Studies: No results found.  Assessment and Plan:   JAIDAN PREVETTE is a 57 y.o. y/o male here for follow-up of ulcerative pancolitis, was found to be in clinical remission on last visit in April 2019 on Asacol, now reporting diarrhea and blood streaks in stool over the last month  As patient is reporting foul-smelling stool, will rule out C. difficile and infectious causes as source of new diarrhea We will also obtain fecal calprotectin We will observe obtain blood work including inflammatory markers  I will increase his Asacol to 1600 mg 3 times a day.  As per up-to-date, Asacol 1.6 g 3 times daily for 6 weeks is a treatment dose for ulcerative colitis.  We will thus increase the dose at this time.  Repeat BMP in 1 to 2 weeks. Depending on lab work can change therapy further I have also asked the patient to call me in 1 week to let us know how his bowel movements are doing with the change in the Asacol If symptoms do not improve quickly, can initiate short-term steroids versus 6-MP  TPMT normal Quantiferon gold negative  Hep a and B testing showed that he is not immune to them Patient continues to refuse vaccination including influenza,  pneumonia, and hepatitis A and B vaccination.  This was encouraged again  The adverse effects of 6-MP including bone marrow suppression, lymphoma, cancers, infections and others were discussed in detail.  Patient may need to be initiated on 6-MP and this was discussed in detail and he is agreeable with the plan.  Will await lab work, and assess for need of change in therapy or addition of 6-MP to the Asacol.  Dr [name]

## 2017-10-20 ENCOUNTER — Other Ambulatory Visit
Admission: RE | Admit: 2017-10-20 | Discharge: 2017-10-20 | Disposition: A | Discharge status: Home / Self Care | Payer: BLUE CROSS/BLUE SHIELD | Source: Ambulatory Visit | Attending: Gastroenterology | Admitting: Gastroenterology

## 2017-10-20 DIAGNOSIS — R197 Diarrhea, unspecified: Secondary | ICD-10-CM | POA: Insufficient documentation

## 2017-10-20 DIAGNOSIS — K5100 Ulcerative (chronic) pancolitis without complications: Secondary | ICD-10-CM | POA: Diagnosis not present

## 2017-10-20 LAB — GASTROINTESTINAL PANEL BY PCR, STOOL (REPLACES STOOL CULTURE)

## 2017-10-20 LAB — C DIFFICILE QUICK SCREEN W PCR REFLEX
C Diff antigen: NEGATIVE
C Diff interpretation: NOT DETECTED
C Diff toxin: NEGATIVE

## 2017-10-21 LAB — CALPROTECTIN, FECAL: CALPROTECTIN, FECAL: 238 ug/g — AB (ref 0–120)

## 2017-10-22 ENCOUNTER — Encounter: Payer: Self-pay | Admitting: Gastroenterology

## 2017-10-22 ENCOUNTER — Ambulatory Visit: Payer: BLUE CROSS/BLUE SHIELD | Admitting: Gastroenterology

## 2017-10-22 DIAGNOSIS — R197 Diarrhea, unspecified: Secondary | ICD-10-CM | POA: Diagnosis not present

## 2017-10-22 DIAGNOSIS — K5100 Ulcerative (chronic) pancolitis without complications: Secondary | ICD-10-CM | POA: Diagnosis not present

## 2017-10-22 MED ORDER — BUDESONIDE ER 9 MG PO TB24: 1.0000 | ORAL_TABLET | Freq: Every day | ORAL | 2 refills | Status: DC

## 2017-10-22 MED ORDER — MESALAMINE (SULFITE-FREE) 4 GM/60ML RE ENEM: 1.0000 | ENEMA | Freq: Two times a day (BID) | RECTAL | 3 refills | Status: DC

## 2017-10-22 NOTE — Addendum Note (Signed)
Addended by: Earl Lagos on: 10/22/2017 02:35 PM   Modules accepted: Orders

## 2017-10-22 NOTE — Addendum Note (Signed)
Addended by: Earl Lagos on: 10/22/2017 02:36 PM   Modules accepted: Orders

## 2017-10-22 NOTE — Progress Notes (Signed)
[name], MD [address]  [place]  [place], [place] [address]  Main: [phone]  Fax: [phone]   Primary Care Physician: [facility], [facility]  Primary Gastroenterologist:  Dr. [name]  Chief Complaint  Patient presents with  . Follow-up    HPI: Steven Whitney is a 57 y.o. male here for follow-up of ulcerative colitis.  Patient compliant with mesalamine 1600 mg 3 times a day.  However, continue to report multiple bowel movements a day, states they are more than 4 to 6 pounds a day, loose, with blood streaks in stool.  Describes decreased appetite.  No fever or chills.  Fecal calprotectin elevated at 238 on October 20, 2017.  C. difficile negative and GI panel negative for infections on September 9 as well.   Initial diagnosis: March 25, 2017 via colonoscopy Current therapy: Asacol 1600 mg 3 times a day  Previous history: Patient was initially seen on March 12, 2017 in clinic consultation for diarrhea and dysphagia. He underwent colonoscopy and EGD on March 25, 2017.  Colonoscopy showed a normal terminal ileum. Decreased vascular pattern in the cecum, ascending colon, transverse colon and descending colon. Mild patchy inflammation characterized by edema and erythema in the sigmoid colon and rectum. A 4 mm descending colon polyp was removed.  EGD showed one small island of salmon colored mucosa, irregular Z line at 40 cm.Atrophic mucosa in the stomach. Normal duodenum.  See detailed scanned biopsy report from the procedures. Biopsies showed evidence of chronicity. Biopsy results were discussed in detail with the reading pathologist Dr. Mali, and he states the features of chronicity are consistent with IBD. He evaluated both upper endoscopy and lower endoscopy biopsies, and states the findings are most consistent with IBD. Eosinophils seen on his biopsies, can be seen with IBD, and are unlikely to be due to  eosinophilic enterocolitis, and more likely due to IBD. He also stated that the granulomata seen on his biopsies is an epithelioid granulomata, and by itself should not be used to diagnose Crohn's disease.He states this type of granulomata can be seen with ulcerative colitis.  Terminal ileum biopsies were normal.  Cecum and ascending colon biopsy: Inactive chronic colitis with increased intraepithelial lymphocytes, negative for dysplasia Transverse colon: Mildly active chronic colitis with epithelioid granulomata and increased intraepithelial lymphocytes negative for dysplasia Descending colon: Moderately active chronic colitis with epithelioid granulomata and increased intraepithelial lymphocytes, negative for dysplasia. CMV immunostain negative. Sigmoid colon and rectum: Moderately active chronic colitis with increased intraepithelial lymphocytes, negative for dysplasia. CMV immunostain is negative. Descending colon polyp: Inflammatory pseudopolyp, negative for dysplasia.  GE junction biopsy: Squamous and gastric type mucosa with reflux type changes. Negative for intestinal metaplasia, negative for eosinophilic esophagitis Esophageal biopsy: Negative for eosinophilic esophagitis. Reactive squamous mucosa. Duodenal biopsy: Small bowel mucosa with no significant pathologic changes. Negative for features of celiac disease. Giardia lamblia organisms not present. Stomach biopsies: Chronic inactive gastritis with increased intramucosal eosinophils. Negative for Helicobacter pylori organism and intestinal metaplasia on Helicobacter stain.   Current Outpatient Medications  Medication Sig Dispense Refill  . LYRICA 225 MG capsule Take 225 mg by mouth 2 (two) times daily.  0  . Mesalamine (ASACOL) 400 MG CPDR DR capsule Take 4 capsules (1,600 mg total) by mouth 3 (three) times daily. 360 capsule 3  . Mesalamine 4 GM/60ML SF ENEM Place 1 enema rectally 2 (two) times daily. 1800 enema 3  .  Budesonide ER (UCERIS) 9 MG TB24 Take 1 tablet by  mouth daily. 30 tablet 2  . methocarbamol (ROBAXIN) 500 MG tablet   0  . omeprazole (PRILOSEC) 20 MG capsule Take 20 mg by mouth daily.     No current facility-administered medications for this visit.     Allergies as of 10/22/2017  . (No Known Allergies)    ROS:  General: Negative for anorexia, weight loss, fever, chills, fatigue, weakness. ENT: Negative for hoarseness, difficulty swallowing , nasal congestion. CV: Negative for chest pain, angina, palpitations, dyspnea on exertion, peripheral edema.  Respiratory: Negative for dyspnea at rest, dyspnea on exertion, cough, sputum, wheezing.  GI: See history of present illness. GU:  Negative for dysuria, hematuria, urinary incontinence, urinary frequency, nocturnal urination.  Endo: Negative for unusual weight change.    Physical Examination:   BP 119/75   Pulse 91   Ht 5' 8"  (1.727 m)   Wt 170 lb 6.4 oz (77.3 kg)   BMI 25.91 kg/m   General: Well-nourished, well-developed in no acute distress.  Eyes: No icterus. Conjunctivae pink. Mouth: Oropharyngeal mucosa moist and pink , no lesions erythema or exudate. Neck: Supple, Trachea midline Abdomen: Bowel sounds are normal, nontender, nondistended, no hepatosplenomegaly or masses, no abdominal bruits or hernia , no rebound or guarding.   Extremities: No lower extremity edema. No clubbing or deformities. Neuro: Alert and oriented x 3.  Grossly intact. Skin: Warm and dry, no jaundice.   Psych: Alert and cooperative, normal mood and affect.   Labs: CMP     Component Value Date/Time   NA 142 05/09/2017 1045   K 4.4 05/09/2017 1045   CL 106 05/09/2017 1045   CO2 22 05/09/2017 1045   GLUCOSE 89 05/09/2017 1045   BUN 17 05/09/2017 1045   CREATININE 0.78 05/09/2017 1045   CALCIUM 9.4 05/09/2017 1045   PROT 7.1 05/09/2017 1045   ALBUMIN 4.5 05/09/2017 1045   AST 13 05/09/2017 1045   ALT 18 05/09/2017 1045   ALKPHOS 59  05/09/2017 1045   BILITOT 0.2 05/09/2017 1045   GFRNONAA 100 05/09/2017 1045   GFRAA 116 05/09/2017 1045   Lab Results  Component Value Date   WBC 5.5 05/09/2017   HGB 13.5 05/09/2017   HCT 40.8 05/09/2017   MCV 80 05/09/2017   PLT 211 05/09/2017    Imaging Studies: No results found.  Assessment and Plan:   Steven Whitney is a 57 y.o. y/o male with ulcerative pancolitis here for follow-up  Patient not in clinical remission with mesalamine 1600 mg 3 times a day As per ACG guidelines, will start on extended release budesonide, Uceris, 9 mg a day Patient is also willing to try enema at this time, will prescribe mesalamine 4 g enema twice daily I have asked the patient to call us in 2 to 3 weeks to let us know how he is doing Continue oral mesalamine at current dose  Follow-up in clinic in 1 month  If symptoms worsen, patient was asked to call us immediately or go to the ER and he verbalized understanding Patient was asked to get labs done, CRP, ESR, CMP, CBC   Dr [name]

## 2017-10-23 LAB — COMPREHENSIVE METABOLIC PANEL
A/G RATIO: 1.3 (ref 1.2–2.2)
ALK PHOS: 83 IU/L (ref 39–117)
ALT: 13 IU/L (ref 0–44)
AST: 12 IU/L (ref 0–40)
Albumin: 3.9 g/dL (ref 3.5–5.5)
BUN/Creatinine Ratio: 12 (ref 9–20)
BUN: 11 mg/dL (ref 6–24)
Bilirubin Total: <0.2 mg/dL (ref 0.0–1.2)
CHLORIDE: 106 mmol/L (ref 96–106)
CO2: 23 mmol/L (ref 20–29)
Calcium: 9.4 mg/dL (ref 8.7–10.2)
Creatinine, Ser: 0.95 mg/dL (ref 0.76–1.27)
GFR calc Af Amer: 102 mL/min/{1.73_m2} (ref >59)
GFR calc non Af Amer: 88 mL/min/{1.73_m2} (ref >59)
GLUCOSE: 98 mg/dL (ref 65–99)
Globulin, Total: 3 g/dL (ref 1.5–4.5)
POTASSIUM: 4.8 mmol/L (ref 3.5–5.2)
Sodium: 144 mmol/L (ref 134–144)
Total Protein: 6.9 g/dL (ref 6.0–8.5)

## 2017-10-23 LAB — SEDIMENTATION RATE: SED RATE: 28 mm/h (ref 0–30)

## 2017-10-23 LAB — C-REACTIVE PROTEIN: CRP: 18 mg/L — AB (ref 0–10)

## 2017-10-27 ENCOUNTER — Telehealth: Payer: Self-pay | Admitting: Gastroenterology

## 2017-10-27 NOTE — Telephone Encounter (Signed)
Pt wife is calling the rx steroid cream  Needs approval from insurance please call pt

## 2017-10-28 ENCOUNTER — Other Ambulatory Visit: Payer: Self-pay

## 2017-10-28 ENCOUNTER — Telehealth: Payer: Self-pay | Admitting: Gastroenterology

## 2017-10-28 MED ORDER — PREDNISONE 10 MG PO TABS: 20.0000 mg | ORAL_TABLET | Freq: Every day | ORAL | 0 refills | Status: DC

## 2017-10-28 NOTE — Telephone Encounter (Signed)
Pt notified that that Budesonide ER is waiting approval. Dr. Bonna Gains said if we did not get approval today to send in prescription for Prednisone 82m po daily, x10 days and wants pt to come to office next week for appt.

## 2017-10-28 NOTE — Telephone Encounter (Signed)
Dr. Darene Lamer asked that the pt call back if he didn't get better after visit. Pt feels he is in fact worst. Pls call asap

## 2017-10-28 NOTE — Telephone Encounter (Signed)
I have spoken to pt and waiting on approval.

## 2017-10-29 NOTE — Telephone Encounter (Signed)
[employer] representative calls and inquired about what previous medication he had taken. Was given information about mesalamine doses, increases, and the suppositories. He will forward this to be reviewed and will contact office and pt regarding approval or not approved.

## 2017-10-30 NOTE — Telephone Encounter (Signed)
Left detailed message for pt on vm not to take the prednisone and budesonide ER together. The budesonide was approved by his insurance company. When he gets the budesonide, he is not to take any more prednisone. If he gets the budesonide on the day he has already taken his prednisone, he should not take the budesonide same day. Start it the next day and do not take anymore prednisone. I called his wife Shirlean Mylar and spoke with her and told her the same thing.

## 2017-10-31 NOTE — Telephone Encounter (Signed)
I had spoke to pt's wife also about the prednisone and budesonide instructions. Currently we are trying to get the authorization number, I could not find this information. Front desk stated that insurance had approved the budesonide. I have faxed the information back to them to get the letter for approval. Left message on voice mail of wife. Pt prefers we talk with her.

## 2017-11-12 ENCOUNTER — Ambulatory Visit: Payer: BLUE CROSS/BLUE SHIELD | Admitting: Gastroenterology

## 2017-11-12 ENCOUNTER — Encounter: Payer: Self-pay | Admitting: Gastroenterology

## 2017-11-12 VITALS — BP 126/76 | HR 76 | Ht 68.0 in | Wt 162.0 lb

## 2017-11-12 DIAGNOSIS — K5100 Ulcerative (chronic) pancolitis without complications: Secondary | ICD-10-CM

## 2017-11-12 DIAGNOSIS — Z23 Encounter for immunization: Secondary | ICD-10-CM

## 2017-11-12 MED ORDER — PREDNISONE 10 MG PO TABS: ORAL_TABLET | ORAL | 0 refills | Status: DC

## 2017-11-12 NOTE — Progress Notes (Signed)
[name], MD [address]  [place]  [place], [place] [address]  Main: [phone]  Fax: [phone]   Primary Care Physician: [facility]  Primary Gastroenterologist:  Dr. [name]  CC: Follow up for UC  HPI: Steven Whitney is a 57 y.o. male here for follow up of pancolonic Ulcerative colitis. Pt. Taking Uceris for the past 11 days and was taking Prednisone prior to that for 11 days since his insurance did not approve his Uceris we prescribed right away and so we had to start Prednisone instead. He reports that he does not have any further hematochezia with his bowel movements, but continues to have multiple loose bowel movements a day, which are affecting his daily quality of life as he is always having to stay near a bathroom, and only eats if he is near a bathroom as he has to go to have a bowel movement as soon as he eats.  Patient also reports weight loss.  Is taking mesalamine 1600 mg 3 times a day.  Stopped taking mesalamine enemas as he stated they were not helping.  No fever or chills.  C. Diff and GI panel negative on October 20, 2017. CRP elevated to  18 on Sep 11th  Initial diagnosis: March 25, 2017 via colonoscopy Current therapy: Asacol 1600 mg 3 times a day                            Prednisone ?20 or ?63m (pt unsure how much he took) from 9/11 to 11/02/17                            Uceris 954mdaily from 11/02/17 to 11/12/17  Previous history: Patient was initially seen on March 12, 2017 in clinic consultation for diarrhea and dysphagia. He underwent colonoscopy and EGD on March 25, 2017.  Colonoscopy showed a normal terminal ileum. Decreased vascular pattern in the cecum, ascending colon, transverse colon and descending colon. Mild patchy inflammation characterized by edema and erythema in the sigmoid colon and rectum. A 4 mm descending colon polyp was removed.  EGD showed one small island of salmon  colored mucosa, irregular Z line at 40 cm.Atrophic mucosa in the stomach. Normal duodenum.  See detailed scanned biopsy report from the procedures. Biopsies showed evidence of chronicity. Biopsy results were discussed in detail with the reading pathologist Dr. ChMaliand he states the features of chronicity are consistent with IBD. He evaluated both upper endoscopy and lower endoscopy biopsies, and states the findings are most consistent with IBD. Eosinophils seen on his biopsies, can be seen with IBD, and are unlikely to be due to eosinophilic enterocolitis, and more likely due to IBD. He also stated that the granulomata seen on his biopsies is an epithelioid granulomata, and by itself should not be used to diagnose Crohn's disease.He states this type of granulomata can be seen with ulcerative colitis.  Terminal ileum biopsies were normal.  Cecum and ascending colon biopsy: Inactive chronic colitis with increased intraepithelial lymphocytes, negative for dysplasia Transverse colon: Mildly active chronic colitis with epithelioid granulomata and increased intraepithelial lymphocytes negative for dysplasia Descending colon: Moderately active chronic colitis with epithelioid granulomata and increased intraepithelial lymphocytes, negative for dysplasia. CMV immunostain negative. Sigmoid colon and rectum: Moderately active chronic colitis with increased intraepithelial lymphocytes, negative for dysplasia. CMV immunostain is negative. Descending colon polyp: Inflammatory pseudopolyp, negative for  dysplasia.  GE junction biopsy: Squamous and gastric type mucosa with reflux type changes. Negative for intestinal metaplasia, negative for eosinophilic esophagitis Esophageal biopsy: Negative for eosinophilic esophagitis. Reactive squamous mucosa. Duodenal biopsy: Small bowel mucosa with no significant pathologic changes. Negative for features of celiac disease. Giardia lamblia organisms not  present. Stomach biopsies: Chronic inactive gastritis with increased intramucosal eosinophils. Negative for Helicobacter pylori organism and intestinal metaplasia on Helicobacter stain.   Current Outpatient Medications  Medication Sig Dispense Refill  . Budesonide ER (UCERIS) 9 MG TB24 Take 1 tablet by mouth daily. 30 tablet 2  . LYRICA 225 MG capsule Take 225 mg by mouth 2 (two) times daily.  0  . Mesalamine (ASACOL) 400 MG CPDR DR capsule Take 4 capsules (1,600 mg total) by mouth 3 (three) times daily. 360 capsule 3  . Mesalamine 4 GM/60ML SF ENEM Place 1 enema rectally 2 (two) times daily. 1800 enema 3  . methocarbamol (ROBAXIN) 500 MG tablet   0  . omeprazole (PRILOSEC) 20 MG capsule Take 20 mg by mouth daily.    . predniSONE (DELTASONE) 10 MG tablet Take 2 tablets (20 mg total) by mouth daily with breakfast. 20 tablet 0   No current facility-administered medications for this visit.     Allergies as of 11/12/2017  . (No Known Allergies)    ROS:  General: Negative for anorexia, weight loss, fever, chills, fatigue, weakness. ENT: Negative for hoarseness, difficulty swallowing , nasal congestion. CV: Negative for chest pain, angina, palpitations, dyspnea on exertion, peripheral edema.  Respiratory: Negative for dyspnea at rest, dyspnea on exertion, cough, sputum, wheezing.  GI: See history of present illness. GU:  Negative for dysuria, hematuria, urinary incontinence, urinary frequency, nocturnal urination.  Endo: Negative for unusual weight change.    Physical Examination:   There were no vitals taken for this visit.  General: Well-nourished, well-developed in no acute distress.  Eyes: No icterus. Conjunctivae pink. Mouth: Oropharyngeal mucosa moist and pink , no lesions erythema or exudate. Neck: Supple, Trachea midline Abdomen: Bowel sounds are normal, nontender, nondistended, no hepatosplenomegaly or masses, no abdominal bruits or hernia , no rebound or guarding.     Extremities: No lower extremity edema. No clubbing or deformities. Neuro: Alert and oriented x 3.  Grossly intact. Skin: Warm and dry, no jaundice.   Psych: Alert and cooperative, normal mood and affect.   Labs: CMP     Component Value Date/Time   NA 144 10/22/2017 1505   K 4.8 10/22/2017 1505   CL 106 10/22/2017 1505   CO2 23 10/22/2017 1505   GLUCOSE 98 10/22/2017 1505   BUN 11 10/22/2017 1505   CREATININE 0.95 10/22/2017 1505   CALCIUM 9.4 10/22/2017 1505   PROT 6.9 10/22/2017 1505   ALBUMIN 3.9 10/22/2017 1505   AST 12 10/22/2017 1505   ALT 13 10/22/2017 1505   ALKPHOS 83 10/22/2017 1505   BILITOT <0.2 10/22/2017 1505   GFRNONAA 88 10/22/2017 1505   GFRAA 102 10/22/2017 1505   Lab Results  Component Value Date   WBC 5.5 05/09/2017   HGB 13.5 05/09/2017   HCT 40.8 05/09/2017   MCV 80 05/09/2017   PLT 211 05/09/2017    Imaging Studies: No results found.  Assessment and Plan:   Steven Whitney is a 57 y.o. y/o male here for follow-up of pancolonic ulcerative colitis with ongoing symptoms despite Uceris and Asacol orally  Resolution and hematochezia suggests that the steroids have improved his disease but ongoing loose stools  and weight loss are not consistent with remission  As per update and recommendations since Budesonide has not lead to significant improvement Prednisone is recommended at this time  Prednisone 8m once daily for 5 days Prednisone 480monce daily for 14 days Prednisone 3072mnce daily for 14 days Prednisone 36m6mce daily for 10-14 days  We will plan on seeing the patient in 3 to 4 weeks, at which point he will be at the 40 or 30 mg dosage to see how he is doing  If he is able to achieve remission with prednisone we would taper prednisone and maintain remission with oral and rectal mesalamine  We will start the approval process for Humira and may need to step up therapy depending on his symptoms.  Risks and benefits of Humira  discussed with patient in detail including but not limited to, risks of cancer such as lymphoma, infection, heart failure, liver problems, skin reactions, lupus like reaction, allergic reaction and others.    Pt. Asked to call us [facility] he is approved for the medication before he starts taking it and verbalized understanding.  We will administer Twinrix vaccine today as he is not immune to hepatitis A and B vaccine He was asked to get influenza and pneumonia vaccines by his primary care provider as we do not have these at our facility  TB gold quantiferon was negative this year  Patient was asked to call us [facility] to the ER if symptoms worsen or any other alarm symptoms occur such as fever chills, nausea vomiting, abdominal pain, hematochezia, or any other reason for concern.  Dr VarnVonda Antigua

## 2017-11-12 NOTE — Patient Instructions (Signed)
Adalimumab Injection What is this medicine? ADALIMUMAB (a dal AYE mu mab) is used to treat rheumatoid and psoriatic arthritis. It is also used to treat ankylosing spondylitis, Crohn's disease, ulcerative colitis, plaque psoriasis, hidradenitis suppurativa, and uveitis. This medicine may be used for other purposes; ask your health care provider or pharmacist if you have questions. COMMON BRAND NAME(S): CYLTEZO, Humira What should I tell my health care provider before I take this medicine? They need to know if you have any of these conditions: -diabetes -heart disease -hepatitis B or history of hepatitis B infection -immune system problems -infection or history of infections -multiple sclerosis -recently received or scheduled to receive a vaccine -scheduled to have surgery -tuberculosis, a positive skin test for tuberculosis or have recently been in close contact with someone who has tuberculosis -an unusual reaction to adalimumab, other medicines, mannitol, latex, rubber, foods, dyes, or preservatives -pregnant or trying to get pregnant -breast-feeding How should I use this medicine? This medicine is for injection under the skin. You will be taught how to prepare and give this medicine. Use exactly as directed. Take your medicine at regular intervals. Do not take your medicine more often than directed. A special MedGuide will be given to you by the pharmacist with each prescription and refill. Be sure to read this information carefully each time. It is important that you put your used needles and syringes in a special sharps container. Do not put them in a trash can. If you do not have a sharps container, call your pharmacist or healthcare provider to get one. Talk to your pediatrician regarding the use of this medicine in children. While this drug may be prescribed for children as young as 2 years for selected conditions, precautions do apply. The manufacturer of the medicine offers free  information to patients and their health care partners. Call [phone] for more information. Overdosage: If you think you have taken too much of this medicine contact a poison control center or emergency room at once. NOTE: This medicine is only for you. Do not share this medicine with others. What if I miss a dose? If you miss a dose, take it as soon as you can. If it is almost time for your next dose, take only that dose. Do not take double or extra doses. Give the next dose when your next scheduled dose is due. Call your doctor or health care professional if you are not sure how to handle a missed dose. What may interact with this medicine? Do not take this medicine with any of the following medications: -abatacept -anakinra -etanercept -infliximab -live virus vaccines -rilonacept This medicine may also interact with the following medications: -vaccines This list may not describe all possible interactions. Give your health care provider a list of all the medicines, herbs, non-prescription drugs, or dietary supplements you use. Also tell them if you smoke, drink alcohol, or use illegal drugs. Some items may interact with your medicine. What should I watch for while using this medicine? Visit your doctor or health care professional for regular checks on your progress. Tell your doctor or healthcare professional if your symptoms do not start to get better or if they get worse. You will be tested for tuberculosis (TB) before you start this medicine. If your doctor prescribes any medicine for TB, you should start taking the TB medicine before starting this medicine. Make sure to finish the full course of TB medicine. Call your doctor or health care professional if you get a cold  or other infection while receiving this medicine. Do not treat yourself. This medicine may decrease your body's ability to fight infection. Talk to your doctor about your risk of cancer. You may be more at risk for  certain types of cancers if you take this medicine. What side effects may I notice from receiving this medicine? Side effects that you should report to your doctor or health care professional as soon as possible: -allergic reactions like skin rash, itching or hives, swelling of the face, lips, or tongue -breathing problems -changes in vision -chest pain -fever, chills, or any other sign of infection -numbness or tingling -red, scaly patches or raised bumps on the skin -swelling of the ankles -swollen lymph nodes in the neck, underarm, or groin areas -unexplained weight loss -unusual bleeding or bruising -unusually weak or tired Side effects that usually do not require medical attention (report to your doctor or health care professional if they continue or are bothersome): -headache -nausea -redness, itching, swelling, or bruising at site where injected This list may not describe all possible side effects. Call your doctor for medical advice about side effects. You may report side effects to FDA at 1-800-FDA-1088. Where should I keep my medicine? Keep out of the reach of children. Store in the original container and in the refrigerator between 2 and 8 degrees C (36 and 46 degrees F). Do not freeze. The product may be stored in a cool carrier with an ice pack, if needed. Protect from light. Throw away any unused medicine after the expiration date. NOTE: This sheet is a summary. It may not cover all possible information. If you have questions about this medicine, talk to your doctor, pharmacist, or health care provider.  2018 Elsevier/Gold Standard (2014-08-17 11:11:43)

## 2017-11-13 LAB — CBC
HEMOGLOBIN: 13.3 g/dL (ref 13.0–17.7)
Hematocrit: 41.1 % (ref 37.5–51.0)
MCH: 26.1 pg — ABNORMAL LOW (ref 26.6–33.0)
MCHC: 32.4 g/dL (ref 31.5–35.7)
MCV: 81 fL (ref 79–97)
Platelets: 322 10*3/uL (ref 150–450)
RBC: 5.1 x10E6/uL (ref 4.14–5.80)
RDW: 12.5 % (ref 12.3–15.4)
WBC: 13.4 10*3/uL — ABNORMAL HIGH (ref 3.4–10.8)

## 2017-11-27 ENCOUNTER — Telehealth: Payer: Self-pay | Admitting: Gastroenterology

## 2017-11-27 NOTE — Telephone Encounter (Signed)
Pt wife left vm to check on status on insurnce verification on rx humara pt is not better on rx he is currently on

## 2017-11-27 NOTE — Telephone Encounter (Signed)
Pt wife is calling for [employer]

## 2017-11-27 NOTE — Telephone Encounter (Signed)
I spoke with wife Shirlean Mylar) and she has not heard anything from [facility] regarding Humira. It was noted on 11/20/17 that they reaching out to pt that this had been approved. Will contact and follow up with [facility].

## 2017-11-28 NOTE — Telephone Encounter (Signed)
I spoke with Shirlean Mylar (wife) after speaking with [facility] regarding Humira administration and delivery. They have contacted pt today and he will receive his medication on 12/02/2017. The nurse from [facility] is to call them back regarding administration. The nurse said they could be instructed over the phone how to administer it.

## 2017-12-01 ENCOUNTER — Telehealth: Payer: Self-pay

## 2017-12-01 NOTE — Telephone Encounter (Signed)
Pt's wife calls this am, approx 8:30 am and states that pt has broke out in a rash under arms which are big spots that are itching x3 days. She has tried Euricen Cream, Benadryl daily and this is not helping. She thinks it may be the Prednisone of which he is on his 4th day.  Pt denies any shortness of breath, chest pain, difficulty breathing, and no fever. I contacted pt's wife Shirlean Mylar) about 12:05 pm and informed her that Dr. Bonna Gains would like for pt to go to PCP, that it should not be the prednisone.

## 2017-12-03 ENCOUNTER — Ambulatory Visit: Payer: BLUE CROSS/BLUE SHIELD | Admitting: Gastroenterology

## 2017-12-03 ENCOUNTER — Telehealth: Payer: Self-pay | Admitting: Gastroenterology

## 2017-12-03 NOTE — Telephone Encounter (Signed)
TIFFANY TRAINING NURSE IS CALLING IN REFERENCE TO PT RX HUMANA PT DID NOT RECEIVE HIS RX HUMARA AND PT WAS SUPPOSED TO START PT IS UPSET PLEASE CONTACT  TIFFANY  [phone]

## 2017-12-03 NOTE — Telephone Encounter (Signed)
I spoke with Tiffany (nurse to teach pt about his humira injections) and gave her [facility] phone number to check on his medication.

## 2017-12-16 ENCOUNTER — Encounter: Payer: Self-pay | Admitting: Gastroenterology

## 2017-12-16 ENCOUNTER — Ambulatory Visit: Payer: BLUE CROSS/BLUE SHIELD | Admitting: Gastroenterology

## 2017-12-16 VITALS — BP 115/66 | HR 89 | Ht 68.0 in | Wt 162.0 lb

## 2017-12-16 DIAGNOSIS — Z23 Encounter for immunization: Secondary | ICD-10-CM | POA: Diagnosis not present

## 2017-12-16 DIAGNOSIS — K5100 Ulcerative (chronic) pancolitis without complications: Secondary | ICD-10-CM

## 2017-12-16 MED ORDER — PREDNISONE 10 MG PO TABS: ORAL_TABLET | ORAL | 0 refills | Status: AC

## 2017-12-16 NOTE — Progress Notes (Signed)
[name], MD [address]  [place]  [place], [place] [address]  Main: [phone]  Fax: [phone]   Primary Care Physician: [facility], [facility]  Primary Gastroenterologist:  Dr. [name]  Chief Complaint  Patient presents with  . Follow-up    humira    HPI: Steven Whitney is a 57 y.o. male here for follow-up of pancolonic ulcerative colitis, diagnosed in February 2019.  Patient started Humira on December 04, 2017, 12 days ago, and reports improvement in symptoms since then.  Is still having 10-15 bowel movements a day, however, reports that he is starting to see more solid stool, and less watery stool.  In addition, blood streaks in stool of completely resolved.  His appetite is better, energy level is better.  He was on prednisone prior to that and he did not notice a difference with the prednisone, until he started the Humira.  No fever or chills.  No nausea vomiting.  Initial diagnosis: March 25, 2017 via colonoscopy  Current therapy: Humira (12/04/17)                            Asacol 1600 mg 3 times a day                            Prednisone taper 11/12/17 (60 mg, 44m, 326m currently at 20 mg)  Previous therapy: Uceris 70m30maily from 11/02/17 to 11/12/17 with no improvement         Mesalamine enema that patient stopped taking  Previous history: Patient was initially seen on March 12, 2017 in clinic consultation for diarrhea and dysphagia. He underwent colonoscopy and EGD on March 25, 2017.  Colonoscopy showed a normal terminal ileum. Decreased vascular pattern in the cecum, ascending colon, transverse colon and descending colon. Mild patchy inflammation characterized by edema and erythema in the sigmoid colon and rectum. A 4 mm descending colon polyp was removed.  EGD showed one small island of salmon colored mucosa, irregular Z line at 40 cm.Atrophic mucosa in the stomach. Normal duodenum.  See detailed  scanned biopsy report from the procedures. Biopsies showed evidence of chronicity. Biopsy results were discussed in detail with the reading pathologist Dr. ChaMalind he states the features of chronicity are consistent with IBD. He evaluated both upper endoscopy and lower endoscopy biopsies, and states the findings are most consistent with IBD. Eosinophils seen on his biopsies, can be seen with IBD, and are unlikely to be due to eosinophilic enterocolitis, and more likely due to IBD. He also stated that the granulomata seen on his biopsies is an epithelioid granulomata, and by itself should not be used to diagnose Crohn's disease.He states this type of granulomata can be seen with ulcerative colitis.  Terminal ileum biopsies were normal.  Cecum and ascending colon biopsy: Inactive chronic colitis with increased intraepithelial lymphocytes, negative for dysplasia Transverse colon: Mildly active chronic colitis with epithelioid granulomata and increased intraepithelial lymphocytes negative for dysplasia Descending colon: Moderately active chronic colitis with epithelioid granulomata and increased intraepithelial lymphocytes, negative for dysplasia. CMV immunostain negative. Sigmoid colon and rectum: Moderately active chronic colitis with increased intraepithelial lymphocytes, negative for dysplasia. CMV immunostain is negative. Descending colon polyp: Inflammatory pseudopolyp, negative for dysplasia.  GE junction biopsy: Squamous and gastric type mucosa with reflux type changes. Negative for intestinal metaplasia, negative for eosinophilic esophagitis Esophageal biopsy: Negative for eosinophilic esophagitis. Reactive squamous  mucosa. Duodenal biopsy: Small bowel mucosa with no significant pathologic changes. Negative for features of celiac disease. Giardia lamblia organisms not present. Stomach biopsies: Chronic inactive gastritis with increased intramucosal eosinophils. Negative for  Helicobacter pylori organism and intestinal metaplasia on Helicobacter stain.  C. Diff and GI panel negative on October 20, 2017. CRP elevated to  18 on Sep 11th  Hepatitis B serology negative March 2019 Hep A total antibody neg March 2019 TB gold QuantiFERON negative March 2019 TPMT normal March 2019  Hepatitis A and B Twinrix vaccine series started October 2019 with second dose today 12/16/17  Current Outpatient Medications  Medication Sig Dispense Refill  . LYRICA 225 MG capsule Take 225 mg by mouth 2 (two) times daily.  0  . Mesalamine (ASACOL) 400 MG CPDR DR capsule Take 4 capsules (1,600 mg total) by mouth 3 (three) times daily. 360 capsule 3  . Mesalamine 4 GM/60ML SF ENEM Place 1 enema rectally 2 (two) times daily. 1800 enema 3  . methocarbamol (ROBAXIN) 500 MG tablet   0  . omeprazole (PRILOSEC) 20 MG capsule Take 20 mg by mouth daily.    . predniSONE (DELTASONE) 10 MG tablet Take 6 tablets (60 mg total) by mouth daily with breakfast for 5 days, THEN 4 tablets (40 mg total) daily with breakfast for 14 days, THEN 3 tablets (30 mg total) daily with breakfast for 14 days, THEN 2 tablets (20 mg total) daily with breakfast for 14 days. 156 tablet 0   No current facility-administered medications for this visit.     Allergies as of 12/16/2017  . (No Known Allergies)    ROS:  General: Negative for anorexia, weight loss, fever, chills, fatigue, weakness. ENT: Negative for hoarseness, difficulty swallowing , nasal congestion. CV: Negative for chest pain, angina, palpitations, dyspnea on exertion, peripheral edema.  Respiratory: Negative for dyspnea at rest, dyspnea on exertion, cough, sputum, wheezing.  GI: See history of present illness. GU:  Negative for dysuria, hematuria, urinary incontinence, urinary frequency, nocturnal urination.  Endo: Negative for unusual weight change.    Physical Examination: Vitals:   12/16/17 1604  BP: 115/66  Pulse: 89  Weight: 162 lb (73.5  kg)  Height: 5' 8"  (1.727 m)     General: Well-nourished, well-developed in no acute distress.  Eyes: No icterus. Conjunctivae pink. Mouth: Oropharyngeal mucosa moist and pink , no lesions erythema or exudate. Neck: Supple, Trachea midline Abdomen: Bowel sounds are normal, nontender, nondistended, no hepatosplenomegaly or masses, no abdominal bruits or hernia , no rebound or guarding.   Extremities: No lower extremity edema. No clubbing or deformities. Neuro: Alert and oriented x 3.  Grossly intact. Skin: Warm and dry, no jaundice.   Psych: Alert and cooperative, normal mood and affect.   Labs: CMP     Component Value Date/Time   NA 144 10/22/2017 1505   K 4.8 10/22/2017 1505   CL 106 10/22/2017 1505   CO2 23 10/22/2017 1505   GLUCOSE 98 10/22/2017 1505   BUN 11 10/22/2017 1505   CREATININE 0.95 10/22/2017 1505   CALCIUM 9.4 10/22/2017 1505   PROT 6.9 10/22/2017 1505   ALBUMIN 3.9 10/22/2017 1505   AST 12 10/22/2017 1505   ALT 13 10/22/2017 1505   ALKPHOS 83 10/22/2017 1505   BILITOT <0.2 10/22/2017 1505   GFRNONAA 88 10/22/2017 1505   GFRAA 102 10/22/2017 1505   Lab Results  Component Value Date   WBC 13.4 (H) 11/12/2017   HGB 13.3 11/12/2017   HCT  41.1 11/12/2017   MCV 81 11/12/2017   PLT 322 11/12/2017    Imaging Studies: No results found.  Assessment and Plan:   DAIEL STROHECKER is a 57 y.o. y/o male here for follow-up of pancolonic ulcerative colitis, diagnosed in February 2019, currently on Humira, started on December 04, 2017  Improvement in symptoms with Humira Patient did not notice improvement on prednisone taper, and did not achieve clinical remission on prednisone, therefore supple therapy was indicated Continue Humira  Risks and benefits of Humira explained to patient in detail, importance of vaccinations discussed in detail.  He has refused vaccinations in the past, including flu vaccine.  He was encouraged to get this along with pneumonia  vaccines as well, and he states he will think about it.  He is due for second Twinrix vaccine today and will be given.  Patient will be referred to dermatology for annual skin exam due to IBD and use of Biologics  We will update labs again today  If patient achieves clinical remission with Humira, which he has shown improvement with Humira at this time, we can consider discontinuing Asacol in the future.  Currently, no adverse effects noted from Humira or Asacol.  Dr [name]

## 2017-12-17 LAB — COMPREHENSIVE METABOLIC PANEL
ALBUMIN: 3.9 g/dL (ref 3.5–5.5)
ALK PHOS: 54 IU/L (ref 39–117)
ALT: 16 IU/L (ref 0–44)
AST: 10 IU/L (ref 0–40)
Albumin/Globulin Ratio: 1.8 (ref 1.2–2.2)
BILIRUBIN TOTAL: 0.2 mg/dL (ref 0.0–1.2)
BUN / CREAT RATIO: 18 (ref 9–20)
BUN: 13 mg/dL (ref 6–24)
CO2: 23 mmol/L (ref 20–29)
CREATININE: 0.73 mg/dL — AB (ref 0.76–1.27)
Calcium: 9 mg/dL (ref 8.7–10.2)
Chloride: 103 mmol/L (ref 96–106)
GFR calc non Af Amer: 102 mL/min/{1.73_m2} (ref >59)
GFR, EST AFRICAN AMERICAN: 118 mL/min/{1.73_m2} (ref >59)
GLUCOSE: 165 mg/dL — AB (ref 65–99)
Globulin, Total: 2.2 g/dL (ref 1.5–4.5)
Potassium: 5.1 mmol/L (ref 3.5–5.2)
Sodium: 139 mmol/L (ref 134–144)
Total Protein: 6.1 g/dL (ref 6.0–8.5)

## 2017-12-17 LAB — C-REACTIVE PROTEIN: CRP: <1 mg/L (ref 0–10)

## 2017-12-17 LAB — CBC
Hematocrit: 42.6 % (ref 37.5–51.0)
Hemoglobin: 13.8 g/dL (ref 13.0–17.7)
MCH: 26.2 pg — ABNORMAL LOW (ref 26.6–33.0)
MCHC: 32.4 g/dL (ref 31.5–35.7)
MCV: 81 fL (ref 79–97)
Platelets: 337 10*3/uL (ref 150–450)
RBC: 5.27 x10E6/uL (ref 4.14–5.80)
RDW: 13.3 % (ref 12.3–15.4)
WBC: 11.6 10*3/uL — ABNORMAL HIGH (ref 3.4–10.8)

## 2017-12-19 ENCOUNTER — Telehealth: Payer: Self-pay | Admitting: Gastroenterology

## 2017-12-19 NOTE — Telephone Encounter (Signed)
[facility] contacted for an emergency dose and pt should receive his medication tomorrow. Wife Shirlean Mylar) notified.

## 2017-12-19 NOTE — Telephone Encounter (Signed)
PT WIFE IS CALLING  TO LET DR. TAHILIANI KNOW PT JERKED WHILE  ADMITTING HIS HUMARA SHOT TODAY AND NEEDS  AN EMERGENCY PRESCRIPTION PLEASE CALL PT

## 2017-12-22 ENCOUNTER — Telehealth: Payer: Self-pay | Admitting: Gastroenterology

## 2017-12-22 NOTE — Telephone Encounter (Signed)
[facility] left vm regarding pt Steven Whitney prescription, when scheduling delivery pt requested 2 Whitney refill they need authorization please call [phone]

## 2017-12-23 NOTE — Telephone Encounter (Signed)
[facility] notified: Approved 2 pens of 15m each per pt request.

## 2018-01-05 ENCOUNTER — Telehealth: Payer: Self-pay

## 2018-01-05 NOTE — Telephone Encounter (Signed)
As soon as he eats go to toilet, with diarrhea; uncontrolled. Blood in stool. Severe pain in stomach (lower), hurts all the time, worse at times than others. If needs another colonoscopy needs to be done, needs to be done this year. Can come in office tomorrow if needed. They may have to find other doctors due to [facility] not accepting [place] next year.  Also called the nurse for [facility] and they told pt to contact office.

## 2018-01-06 ENCOUNTER — Other Ambulatory Visit: Payer: Self-pay | Admitting: Gastroenterology

## 2018-01-06 DIAGNOSIS — R197 Diarrhea, unspecified: Secondary | ICD-10-CM

## 2018-01-06 DIAGNOSIS — K51011 Ulcerative (chronic) pancolitis with rectal bleeding: Secondary | ICD-10-CM

## 2018-01-07 ENCOUNTER — Other Ambulatory Visit
Admission: RE | Admit: 2018-01-07 | Discharge: 2018-01-07 | Disposition: A | Discharge status: Home / Self Care | Payer: BLUE CROSS/BLUE SHIELD | Source: Ambulatory Visit | Attending: Gastroenterology | Admitting: Gastroenterology

## 2018-01-07 ENCOUNTER — Telehealth: Payer: Self-pay

## 2018-01-07 DIAGNOSIS — K51011 Ulcerative (chronic) pancolitis with rectal bleeding: Secondary | ICD-10-CM | POA: Diagnosis present

## 2018-01-07 DIAGNOSIS — R197 Diarrhea, unspecified: Secondary | ICD-10-CM | POA: Diagnosis present

## 2018-01-07 LAB — COMPREHENSIVE METABOLIC PANEL
ALBUMIN: 3.6 g/dL (ref 3.5–5.0)
ALT: 16 U/L (ref 0–44)
ANION GAP: 9 (ref 5–15)
AST: 12 U/L — ABNORMAL LOW (ref 15–41)
Alkaline Phosphatase: 50 U/L (ref 38–126)
BUN: 13 mg/dL (ref 6–20)
CO2: 24 mmol/L (ref 22–32)
Calcium: 9.2 mg/dL (ref 8.9–10.3)
Chloride: 106 mmol/L (ref 98–111)
Creatinine, Ser: 0.84 mg/dL (ref 0.61–1.24)
GFR calc non Af Amer: >60 mL/min (ref >60)
Glucose, Bld: 111 mg/dL — ABNORMAL HIGH (ref 70–99)
POTASSIUM: 4.4 mmol/L (ref 3.5–5.1)
SODIUM: 139 mmol/L (ref 135–145)
TOTAL PROTEIN: 6.7 g/dL (ref 6.5–8.1)
Total Bilirubin: 0.6 mg/dL (ref 0.3–1.2)

## 2018-01-07 LAB — CBC
HEMATOCRIT: 44.5 % (ref 39.0–52.0)
Hemoglobin: 14.2 g/dL (ref 13.0–17.0)
MCH: 26.5 pg (ref 26.0–34.0)
MCHC: 31.9 g/dL (ref 30.0–36.0)
MCV: 83 fL (ref 80.0–100.0)
NRBC: 0 % (ref 0.0–0.2)
PLATELETS: 298 10*3/uL (ref 150–400)
RBC: 5.36 MIL/uL (ref 4.22–5.81)
RDW: 14.1 % (ref 11.5–15.5)
WBC: 16.1 10*3/uL — AB (ref 4.0–10.5)

## 2018-01-07 LAB — C-REACTIVE PROTEIN: CRP: <0.8 mg/dL (ref <1.0)

## 2018-01-07 NOTE — Telephone Encounter (Signed)
I gave pt the information per Dr. Bonna Gains. He states he is on the dosage of 1 tab of prednisone daily now. Pt appreciative.

## 2018-01-07 NOTE — Telephone Encounter (Signed)
Wife returns call and states that pt has had lab done at [facility] and he has his supplies for his stool samples. He did decline to come to office today. Will wait on results.

## 2018-01-07 NOTE — Telephone Encounter (Signed)
-----   Message from Virgel Manifold, MD sent at 01/07/2018  2:09 PM EST ----- [name] please let patient know, his blood work showed some elevation in his white count.    This can be elevated due to his prednisone use, or can be due to infection. He does not have any anemia which is good. Is he having any fever or chills?  If so, he will need a CT abdomen and pelvis with contrast.   Otherwise, we will await his stool studies to see if he has an infection from C. difficile for other organisms that are causing his diarrhea.  His CRP is also pending at this time and I have talked the lab and they said it might not be back today.  If he gets worse over the weekend I would advise that he come to the ER, or call and talk to the GI on-call provider during the weekend.  Continue to take the Humira every 2 weeks as prescribed.

## 2018-01-07 NOTE — Telephone Encounter (Signed)
I spoke to pt's wife and informed her of Dr. Michele Mcalpine recommendations to get lab work, including stool specimens and to get blood work done today (Tuesday) at the hospital and stool samples as soon as possible. Pt is receiving his Humira as prescribed.  I spoke with wife this am and she thought pt was going to have blood work this am. I informed her that Dr. Bonna Gains asked about pt. And said that she wanted to make sure pt does not have an infection. I told her he could come to the office and have blood work done if he liked or go to hospital for it as previously stated. If he would like she could see him in office at 1:00pm but definitely needs labs to help assess the issue.  He was out and she will let me know.

## 2018-01-09 ENCOUNTER — Other Ambulatory Visit
Admission: RE | Admit: 2018-01-09 | Discharge: 2018-01-09 | Disposition: A | Discharge status: Home / Self Care | Payer: BLUE CROSS/BLUE SHIELD | Source: Ambulatory Visit | Attending: Gastroenterology | Admitting: Gastroenterology

## 2018-01-09 DIAGNOSIS — R197 Diarrhea, unspecified: Secondary | ICD-10-CM

## 2018-01-09 DIAGNOSIS — K51011 Ulcerative (chronic) pancolitis with rectal bleeding: Secondary | ICD-10-CM | POA: Insufficient documentation

## 2018-01-09 LAB — GASTROINTESTINAL PANEL BY PCR, STOOL (REPLACES STOOL CULTURE)

## 2018-01-09 LAB — C DIFFICILE QUICK SCREEN W PCR REFLEX
C DIFFICILE (CDIFF) INTERP: NOT DETECTED
C Diff antigen: NEGATIVE
C Diff toxin: NEGATIVE

## 2018-01-15 ENCOUNTER — Encounter: Payer: Self-pay | Admitting: Gastroenterology

## 2018-01-15 ENCOUNTER — Ambulatory Visit: Payer: BLUE CROSS/BLUE SHIELD | Admitting: Gastroenterology

## 2018-01-15 VITALS — BP 119/83 | HR 90 | Ht 68.0 in | Wt 157.0 lb

## 2018-01-15 DIAGNOSIS — K5100 Ulcerative (chronic) pancolitis without complications: Secondary | ICD-10-CM

## 2018-01-15 NOTE — Progress Notes (Signed)
[name], MD [address]  [place]  [place], [place] [address]  Main: [phone]  Fax: [phone]   Primary Care Physician: [facility]  Primary Gastroenterologist:  Dr. [name]  Chief complaint: Follow-up of colitis  HPI: Steven Whitney is a 57 y.o. 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  Started on Humira on December 04, 2017.  Patient is currently on prednisone 10 mg a day as he is on a taper and decreasing to 10 mg a day on November 18.  Even before the decrease patient reported diarrhea and blood streaks in stool.  Denies any fever or chills.  Patient had called the office last week due to diarrhea and blood in stool.  Labs were done on November 27.  C. difficile panel and GI panel negative for infection.  CRP was normal.  CBC did not show any anemia.  Elevated white count was attributed to prednisone use.  Electrolytes and liver enzymes were normal.  Patient continues to complain of the above symptoms without improvement.  Initially when he was started on Humira patient had reported improvement in his diarrhea and blood streaks had resolved and now they have recurred over the last 2 to 3 weeks.  Initial diagnosis: March 25, 2017 via colonoscopy  Current therapy: Humira (12/04/17)                            Asacol 1600 mg 3 times a day Prednisone taper 11/12/17 -completed  Previous therapy: Uceris 6m daily from 11/02/17 to 11/12/17 with no improvement                             Mesalamine enema that patient stopped taking  Previous history: Patient was initially seen on March 12, 2017 in clinic consultation for diarrhea and dysphagia. He underwent colonoscopy and EGD on March 25, 2017.  Colonoscopy showed a normal terminal ileum. Decreased vascular pattern in the cecum, ascending colon, transverse colon and descending colon. Mild patchy  inflammation characterized by edema and erythema in the sigmoid colon and rectum. A 4 mm descending colon polyp was removed.  EGD showed one small island of salmon colored mucosa, irregular Z line at 40 cm.Atrophic mucosa in the stomach. Normal duodenum.  See detailed scanned biopsy report from the procedures. Biopsies showed evidence of chronicity. Biopsy results were discussed in detail with the reading pathologist Dr. CMali and he states the features of chronicity are consistent with IBD. He evaluated both upper endoscopy and lower endoscopy biopsies, and states the findings are most consistent with IBD. Eosinophils seen on his biopsies, can be seen with IBD, and are unlikely to be due to eosinophilic enterocolitis, and more likely due to IBD. He also stated that the granulomata seen on his biopsies is an epithelioid granulomata, and by itself should not be used to diagnose Crohn's disease.He states this type of granulomata can be seen with ulcerative colitis.  Terminal ileum biopsies were normal.  Cecum and ascending colon biopsy: Inactive chronic colitis with increased intraepithelial lymphocytes, negative for dysplasia Transverse colon: Mildly active chronic colitis with epithelioid granulomata and increased intraepithelial lymphocytes negative for dysplasia Descending colon: Moderately active chronic colitis with epithelioid granulomata and increased intraepithelial lymphocytes, negative for dysplasia. CMV immunostain negative. Sigmoid colon and rectum: Moderately active chronic colitis with increased intraepithelial lymphocytes, negative for dysplasia. CMV immunostain is  negative. Descending colon polyp: Inflammatory pseudopolyp, negative for dysplasia.  GE junction biopsy: Squamous and gastric type mucosa with reflux type changes. Negative for intestinal metaplasia, negative for eosinophilic esophagitis Esophageal biopsy: Negative for eosinophilic esophagitis. Reactive  squamous mucosa. Duodenal biopsy: Small bowel mucosa with no significant pathologic changes. Negative for features of celiac disease. Giardia lamblia organisms not present. Stomach biopsies: Chronic inactive gastritis with increased intramucosal eosinophils. Negative for Helicobacter pylori organism and intestinal metaplasia on Helicobacter stain.  C. Diff andGI panel negative on October 20, 2017. CRP elevated to 18 on Sep 11th  Hepatitis B serology negative March 2019 Hep A total antibody neg March 2019 TB gold QuantiFERON negative March 2019 TPMT normal March 2019  Hepatitis A and B Twinrix vaccine series started October 2019 with second dose today 12/16/17  Current Outpatient Medications  Medication Sig Dispense Refill  . HUMIRA PEN-CD/UC/HS STARTER 80 MG/0.8ML PNKT   0  . LYRICA 225 MG capsule Take 225 mg by mouth 2 (two) times daily.  0  . Mesalamine (ASACOL) 400 MG CPDR DR capsule Take 4 capsules (1,600 mg total) by mouth 3 (three) times daily. 360 capsule 3  . Mesalamine 4 GM/60ML SF ENEM Place 1 enema rectally 2 (two) times daily. 1800 enema 3  . methocarbamol (ROBAXIN) 500 MG tablet   0  . omeprazole (PRILOSEC) 20 MG capsule Take 20 mg by mouth daily.    . psyllium (METAMUCIL) 58.6 % powder Take 1 packet by mouth daily.     No current facility-administered medications for this visit.     Allergies as of 01/15/2018  . (No Known Allergies)    ROS:  General: Negative for anorexia, weight loss, fever, chills, fatigue, weakness. ENT: Negative for hoarseness, difficulty swallowing , nasal congestion. CV: Negative for chest pain, angina, palpitations, dyspnea on exertion, peripheral edema.  Respiratory: Negative for dyspnea at rest, dyspnea on exertion, cough, sputum, wheezing.  GI: See history of present illness. GU:  Negative for dysuria, hematuria, urinary incontinence, urinary frequency, nocturnal urination.  Endo: Negative for unusual weight change.    Physical  Examination:   There were no vitals taken for this visit.  General: Well-nourished, well-developed in no acute distress.  Eyes: No icterus. Conjunctivae pink. Mouth: Oropharyngeal mucosa moist and pink , no lesions erythema or exudate. Neck: Supple, Trachea midline Abdomen: Bowel sounds are normal, nontender, nondistended, no hepatosplenomegaly or masses, no abdominal bruits or hernia , no rebound or guarding.   Extremities: No lower extremity edema. No clubbing or deformities. Neuro: Alert and oriented x 3.  Grossly intact. Skin: Warm and dry, no jaundice.   Psych: Alert and cooperative, normal mood and affect.   Labs: CMP     Component Value Date/Time   NA 139 01/07/2018 1040   NA 139 12/16/2017 1533   K 4.4 01/07/2018 1040   CL 106 01/07/2018 1040   CO2 24 01/07/2018 1040   GLUCOSE 111 (H) 01/07/2018 1040   BUN 13 01/07/2018 1040   BUN 13 12/16/2017 1533   CREATININE 0.84 01/07/2018 1040   CALCIUM 9.2 01/07/2018 1040   PROT 6.7 01/07/2018 1040   PROT 6.1 12/16/2017 1533   ALBUMIN 3.6 01/07/2018 1040   ALBUMIN 3.9 12/16/2017 1533   AST 12 (L) 01/07/2018 1040   ALT 16 01/07/2018 1040   ALKPHOS 50 01/07/2018 1040   BILITOT 0.6 01/07/2018 1040   BILITOT 0.2 12/16/2017 1533   GFRNONAA >60 01/07/2018 1040   GFRAA >60 01/07/2018 1040   Lab Results  Component Value Date   WBC 16.1 (H) 01/07/2018   HGB 14.2 01/07/2018   HCT 44.5 01/07/2018   MCV 83.0 01/07/2018   PLT 298 01/07/2018    Imaging Studies: No results found.  Assessment and Plan:   Steven Whitney is a 57 y.o. y/o male here for follow-up of pancolonic ulcerative colitis, currently on Humira and Asacol  Due to symptoms of diarrhea and blood streaks in stool despite Humira every 2 weeks, will evaluate with colonoscopy We will also evaluate with EGD as patient is also reporting abdominal pain Typically we allow Humira about 3 months before we consider it to be failed treatment, however, given ongoing  symptoms despite prednisone and Humira use endoscopic evaluation is indicated at this time  Discontinue prednisone as patient has been taking 10 mg a day since November 18 and even on the higher dose he was having symptoms until he had started Humira.  No signs of infection Although labs are very reassuring (except for elevated white count which is due to prednisone use only, patient is otherwise afebrile), patient is reporting symptoms that require endoscopic evaluation  May need to consider changing therapy if active inflammation is present with endoscopic evaluation  Patient is also stating that he will have to change positions due to: Health not being covered by [facility] and will have to change providers to [facility].  I have asked him to inform us as soon as he establishes new GI as we would help facilitate his transition of care to them  I have discussed alternative options, risks & benefits,  which include, but are not limited to, bleeding, infection, perforation,respiratory complication & drug reaction.  The patient agrees with this plan & written consent will be obtained.     Dr [name]

## 2018-01-16 ENCOUNTER — Other Ambulatory Visit: Payer: Self-pay

## 2018-01-16 DIAGNOSIS — K5100 Ulcerative (chronic) pancolitis without complications: Secondary | ICD-10-CM

## 2018-01-16 DIAGNOSIS — R109 Unspecified abdominal pain: Secondary | ICD-10-CM

## 2018-01-22 ENCOUNTER — Other Ambulatory Visit: Payer: Self-pay | Admitting: Gastroenterology

## 2018-01-22 ENCOUNTER — Other Ambulatory Visit: Payer: Self-pay

## 2018-01-22 ENCOUNTER — Ambulatory Visit
Admission: RE | Admit: 2018-01-22 | Discharge: 2018-01-22 | Disposition: A | Discharge status: Home / Self Care | Payer: BLUE CROSS/BLUE SHIELD | Attending: Gastroenterology | Admitting: Gastroenterology

## 2018-01-22 ENCOUNTER — Ambulatory Visit: Payer: BLUE CROSS/BLUE SHIELD | Admitting: Anesthesiology

## 2018-01-22 ENCOUNTER — Encounter: Payer: Self-pay | Admitting: *Deleted

## 2018-01-22 ENCOUNTER — Encounter
Admission: RE | Disposition: A | Discharge status: Home / Self Care | Payer: Self-pay | Source: Home / Self Care | Attending: Gastroenterology

## 2018-01-22 DIAGNOSIS — R197 Diarrhea, unspecified: Secondary | ICD-10-CM

## 2018-01-22 DIAGNOSIS — K529 Noninfective gastroenteritis and colitis, unspecified: Secondary | ICD-10-CM | POA: Insufficient documentation

## 2018-01-22 DIAGNOSIS — M19212 Secondary osteoarthritis, left shoulder: Secondary | ICD-10-CM | POA: Insufficient documentation

## 2018-01-22 DIAGNOSIS — K228 Other specified diseases of esophagus: Secondary | ICD-10-CM | POA: Diagnosis not present

## 2018-01-22 DIAGNOSIS — R634 Abnormal weight loss: Secondary | ICD-10-CM | POA: Diagnosis not present

## 2018-01-22 DIAGNOSIS — M479 Spondylosis, unspecified: Secondary | ICD-10-CM | POA: Insufficient documentation

## 2018-01-22 DIAGNOSIS — K2950 Unspecified chronic gastritis without bleeding: Secondary | ICD-10-CM | POA: Insufficient documentation

## 2018-01-22 DIAGNOSIS — Z6823 Body mass index (BMI) 23.0-23.9, adult: Secondary | ICD-10-CM | POA: Insufficient documentation

## 2018-01-22 DIAGNOSIS — M797 Fibromyalgia: Secondary | ICD-10-CM | POA: Diagnosis not present

## 2018-01-22 DIAGNOSIS — K219 Gastro-esophageal reflux disease without esophagitis: Secondary | ICD-10-CM | POA: Insufficient documentation

## 2018-01-22 DIAGNOSIS — K5100 Ulcerative (chronic) pancolitis without complications: Secondary | ICD-10-CM | POA: Insufficient documentation

## 2018-01-22 DIAGNOSIS — Z79899 Other long term (current) drug therapy: Secondary | ICD-10-CM | POA: Insufficient documentation

## 2018-01-22 DIAGNOSIS — Z87891 Personal history of nicotine dependence: Secondary | ICD-10-CM | POA: Insufficient documentation

## 2018-01-22 DIAGNOSIS — K3189 Other diseases of stomach and duodenum: Secondary | ICD-10-CM | POA: Diagnosis not present

## 2018-01-22 DIAGNOSIS — M19211 Secondary osteoarthritis, right shoulder: Secondary | ICD-10-CM | POA: Insufficient documentation

## 2018-01-22 DIAGNOSIS — K6289 Other specified diseases of anus and rectum: Secondary | ICD-10-CM | POA: Diagnosis not present

## 2018-01-22 DIAGNOSIS — K51011 Ulcerative (chronic) pancolitis with rectal bleeding: Secondary | ICD-10-CM

## 2018-01-22 DIAGNOSIS — R1084 Generalized abdominal pain: Secondary | ICD-10-CM

## 2018-01-22 DIAGNOSIS — R109 Unspecified abdominal pain: Secondary | ICD-10-CM

## 2018-01-22 HISTORY — PX: ESOPHAGOGASTRODUODENOSCOPY (EGD) WITH PROPOFOL: SHX5813

## 2018-01-22 HISTORY — PX: COLONOSCOPY WITH PROPOFOL: SHX5780

## 2018-01-22 SURGERY — COLONOSCOPY WITH PROPOFOL
Anesthesia: General

## 2018-01-22 MED ORDER — PROPOFOL 500 MG/50ML IV EMUL
INTRAVENOUS | Status: DC | PRN
  Administered 2018-01-22: 140 ug/kg/min via INTRAVENOUS

## 2018-01-22 MED ORDER — PREDNISONE 10 MG PO TABS: 60.0000 mg | ORAL_TABLET | Freq: Every day | ORAL | 0 refills | Status: DC

## 2018-01-22 MED ORDER — SODIUM CHLORIDE 0.9 % IV SOLN
INTRAVENOUS | Status: DC
  Administered 2018-01-22: 08:00:00 via INTRAVENOUS

## 2018-01-22 MED ORDER — PROPOFOL 500 MG/50ML IV EMUL
INTRAVENOUS | Status: AC
  Filled 2018-01-22: qty 50

## 2018-01-22 MED ORDER — MIDAZOLAM HCL 2 MG/2ML IJ SOLN
INTRAMUSCULAR | Status: AC
  Filled 2018-01-22: qty 2

## 2018-01-22 MED ORDER — FENTANYL CITRATE (PF) 100 MCG/2ML IJ SOLN
INTRAMUSCULAR | Status: DC | PRN
  Administered 2018-01-22: 50 ug via INTRAVENOUS

## 2018-01-22 MED ORDER — PROPOFOL 10 MG/ML IV BOLUS
INTRAVENOUS | Status: DC | PRN
  Administered 2018-01-22 (×2): 35.6 mg via INTRAVENOUS

## 2018-01-22 MED ORDER — MERCAPTOPURINE 50 MG PO TABS: 50.0000 mg | ORAL_TABLET | Freq: Every day | ORAL | 1 refills | Status: AC

## 2018-01-22 MED ORDER — MIDAZOLAM HCL 2 MG/2ML IJ SOLN
INTRAMUSCULAR | Status: DC | PRN
  Administered 2018-01-22: 1 mg via INTRAVENOUS

## 2018-01-22 MED ORDER — FENTANYL CITRATE (PF) 100 MCG/2ML IJ SOLN
INTRAMUSCULAR | Status: AC
  Filled 2018-01-22: qty 2

## 2018-01-22 MED ORDER — LIDOCAINE HCL (PF) 2 % IJ SOLN
INTRAMUSCULAR | Status: AC
  Filled 2018-01-22: qty 10

## 2018-01-22 NOTE — Anesthesia Post-op Follow-up Note (Signed)
Anesthesia QCDR form completed.        

## 2018-01-22 NOTE — Telephone Encounter (Signed)
Due to the endoscopic inflammation seen on colonoscopy, and ongoing symptoms today we discussed change in therapy. Pt is not in clinical or endoscopic remission despite Humira for almost 2 months (started on October 24).   Patient is afebrile, vitals are stable.  No indication for inpatient admission.  New labs have been ordered, including Humira drug level and antibody level His CRP has been normal recently  We will start patient on prednisone 60 mg daily and see him in clinic in 1 week to assess symptom improvement  We also discussed starting mercaptopurine along with the Humira.  We discussed risks and benefits of the new medication.  We discussed risks of cancer, lymphoma, melanoma.  Patient and wife verbalized understanding and would like to proceed with starting mercaptopurine along with Humira.  We will discontinue the Asacol since it is not helping.  If patient clinically worsens I have asked him to notify us and he verbalized understanding.  We will follow closely in clinic next week.  I have also encouraged patient to call Dr. Cordella Register office at [facility] as his insurance is changing and he cannot follow-up at [facility] health next year anymore.  Therefore I have encouraged him to go ahead and get an appointment made with him  for early in January.

## 2018-01-22 NOTE — Anesthesia Postprocedure Evaluation (Signed)
Anesthesia Post Note  Patient: Steven Whitney  Procedure(s) Performed: COLONOSCOPY WITH PROPOFOL (N/A ) ESOPHAGOGASTRODUODENOSCOPY (EGD) WITH PROPOFOL (N/A )  Patient location during evaluation: Endoscopy Anesthesia Type: General Level of consciousness: awake and alert Pain management: pain level controlled Vital Signs Assessment: post-procedure vital signs reviewed and stable Respiratory status: spontaneous breathing and respiratory function stable Cardiovascular status: stable Anesthetic complications: no     Last Vitals:  Vitals:   01/22/18 0724 01/22/18 0852  BP: (!) 117/94 103/75  Pulse:  85  Resp: 18 13  Temp: (!) 36 C (!) 36 C  SpO2: 100% 100%    Last Pain:  Vitals:   01/22/18 0852  TempSrc: Tympanic  PainSc: Asleep                 Astraea Gaughran K

## 2018-01-22 NOTE — H&P (Signed)
[name], MD [address], [place], [place], [place], [address] [id number], [place], [place], [place], [address] Phone: [phone]  Fax: [phone]  Primary Care Physician:  [facility]   Pre-Procedure History & Physical: HPI:  Steven Whitney is a 58 y.o. male is here for a colonoscopy and EGD.   Past Medical History:  Diagnosis Date  . Arthritis    neck and shoulders  . COPD (chronic obstructive pulmonary disease) (HCC)    LUNG DISEASE   . Fibromyalgia   . GERD (gastroesophageal reflux disease)   . Headache   . Ulcer     Past Surgical History:  Procedure Laterality Date  . COLONOSCOPY WITH PROPOFOL N/A 03/25/2017   Procedure: COLONOSCOPY WITH PROPOFOL;  Surgeon: Virgel Manifold, MD;  Location: [facility];  Service: Endoscopy;  Laterality: N/A;  . ESOPHAGOGASTRODUODENOSCOPY (EGD) WITH PROPOFOL N/A 03/25/2017   Procedure: ESOPHAGOGASTRODUODENOSCOPY (EGD) WITH PROPOFOL;  Surgeon: Virgel Manifold, MD;  Location: [facility];  Service: Endoscopy;  Laterality: N/A;  . LUNG SURGERY  2001  . POLYPECTOMY  03/25/2017   Procedure: POLYPECTOMY;  Surgeon: Virgel Manifold, MD;  Location: [facility];  Service: Endoscopy;;  . [place]    Prior to Admission medications   Medication Sig Start Date End Date Taking? Authorizing Provider  HUMIRA PEN-CD/UC/HS STARTER 80 MG/0.8ML PNKT  11/28/17  Yes [provider]  LYRICA 225 MG capsule Take 225 mg by mouth 2 (two) times daily. 03/10/17  Yes [provider]  omeprazole (PRILOSEC) 20 MG capsule Take 20 mg by mouth daily.   Yes [provider]  psyllium (METAMUCIL) 58.6 % powder Take 1 packet by mouth daily.   Yes [provider]  Mesalamine (ASACOL) 400 MG CPDR DR capsule Take 4 capsules (1,600 mg total) by mouth 3 (three) times daily. 09/30/17 10/30/17  Virgel Manifold, MD  Mesalamine 4 GM/60ML SF ENEM Place 1  enema rectally 2 (two) times daily. 10/22/17 11/21/17  Virgel Manifold, MD  methocarbamol (ROBAXIN) 500 MG tablet  03/10/17   [provider]  nystatin cream (MYCOSTATIN) APPLY TO THE AFFECTED AREA TID 12/02/17   [provider]    Allergies as of 01/16/2018  . (No Known Allergies)    Family History  Problem Relation Age of Onset  . Cancer Other   . Ulcerative colitis Other     Social History   Socioeconomic History  . Marital status: Married    Spouse name: Not on file  . Number of children: Not on file  . Years of education: Not on file  . Highest education level: Not on file  Occupational History  . Not on file  Social Needs  . Financial resource strain: Not on file  . Food insecurity:    Worry: Not on file    Inability: Not on file  . Transportation needs:    Medical: Not on file    Non-medical: Not on file  Tobacco Use  . Smoking status: Former Research scientist (life sciences)  . Smokeless tobacco: Never Used  Substance and Sexual Activity  . Alcohol use: No    Frequency: Never  . Drug use: No  . Sexual activity: Not on file  Lifestyle  . Physical activity:    Days per week: Not on file    Minutes per session: Not on file  . Stress: Not on file  Relationships  . Social connections:    Talks  on phone: Not on file    Gets together: Not on file    Attends religious service: Not on file    Active member of club or organization: Not on file    Attends meetings of clubs or organizations: Not on file    Relationship status: Not on file  . Intimate partner violence:    Fear of current or ex partner: Not on file    Emotionally abused: Not on file    Physically abused: Not on file    Forced sexual activity: Not on file  Other Topics Concern  . Not on file  Social History Narrative  . Not on file    Review of Systems: See HPI, otherwise negative ROS  Physical Exam: BP (!) 117/94   Temp (!) 96.8 F (36 C) (Tympanic)   Resp 18   Ht 5' 8"  (1.727 m)   Wt  71.2 kg   SpO2 100%   BMI 23.87 kg/m  General:   Alert,  pleasant and cooperative in NAD Head:  Normocephalic and atraumatic. Neck:  Supple; no masses or thyromegaly. Lungs:  Clear throughout to auscultation, normal respiratory effort.    Heart:  +S1, +S2, Regular rate and rhythm, No edema. Abdomen:  Soft, nontender and nondistended. Normal bowel sounds, without guarding, and without rebound.   Neurologic:  Alert and  oriented x4;  grossly normal neurologically.  Impression/Plan: Steven Whitney is here for a colonoscopy to be performed for ulcerative colitis, diarrhea, blood in stool and EGD for abdominal pain weight loss.  Risks, benefits, limitations, and alternatives regarding the procedures have been reviewed with the patient.  Questions have been answered.  All parties agreeable.   Virgel Manifold, MD  01/22/2018, 8:11 AM

## 2018-01-22 NOTE — Op Note (Signed)
[facility] Gastroenterology Patient Name: Steven Whitney Procedure Date: 01/22/2018 8:11 AM MRN: [id number] Account #: [id number] Date of Birth: [birth year] Admit Type: Outpatient Age: 57 Room: [facility] ENDO ROOM 2 Gender: Male Note Status: Finalized Procedure:            Colonoscopy Indications:          Follow-up of chronic ulcerative pancolitis, Disease                        activity assessment of chronic ulcerative pancolitis,                        Assess therapeutic response to therapy of chronic                        ulcerative pancolitis Providers:            Zennie Ayars B. Bonna Gains MD, MD Referring MD:         Forest Gleason Md, MD (Referring MD) Medicines:            Monitored Anesthesia Care Complications:        No immediate complications. Procedure:            Pre-Anesthesia Assessment:                       - Prior to the procedure, a History and Physical was                        performed, and patient medications, allergies and                        sensitivities were reviewed. The patient's tolerance of                        previous anesthesia was reviewed.                       - The risks and benefits of the procedure and the                        sedation options and risks were discussed with the                        patient. All questions were answered and informed                        consent was obtained.                       - Patient identification and proposed procedure were                        verified prior to the procedure by the physician, the                        nurse, the anesthesiologist, the anesthetist and the                        technician. The procedure was verified in the  pre-procedure area in the procedure room in the                        endoscopy suite.                       - Prophylactic Antibiotics: The patient does not                        require prophylactic antibiotics.                        - ASA Grade Assessment: III - A patient with severe                        systemic disease.                       - After reviewing the risks and benefits, the patient                        was deemed in satisfactory condition to undergo the                        procedure.                       - Monitored anesthesia care was determined to be                        medically necessary for this procedure based on review                        of the patient's medical history, medications, and                        prior anesthesia history.                       - The anesthesia plan was to use monitored anesthesia                        care (MAC).                       After obtaining informed consent, the colonoscope was                        passed under direct vision. Throughout the procedure,                        the patient's blood pressure, pulse, and oxygen                        saturations were monitored continuously. The                        Colonoscope was introduced through the anus and                        advanced to the the terminal ileum. The colonoscopy was  performed with ease. The patient tolerated the                        procedure well. The quality of the bowel preparation                        was good. Findings:      The perianal and digital rectal examinations were normal.      Patchy inflammation, mild in severity and characterized by shallow       ulcerations was found in the terminal ileum. This was consistent with       backwash ileitis. The ulceration was only present just proximal to the       opening of the Ileocecal valve. The more proximal terminal ileum       appeared normal.      Diffuse moderate inflammation characterized by altered vascularity,       erythema and shallow ulcerations was found in the rectum, in the sigmoid       colon, in the descending colon, in the transverse colon, in the        ascending colon and in the cecum. Biopsies were taken with a cold       forceps for histology. Impression:           - Ileitis.                       - Diffuse moderate inflammation was found in the                        rectum, in the sigmoid colon, in the descending colon,                        in the transverse colon, in the ascending colon and in                        the cecum secondary to pancolitis ulcerative colitis.                        Biopsied.                       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 Compared to previous                        colonoscopy, this is consistent with worsening of                        disease. Recommendation:       - Await pathology results.                       - Start prednisone                       Obtain Humira drug and antibody levels                       Based on above workup change management  Pt is switching care to [facility] due to                        insurance and I gave him the contact info for [employer] and asked him to make an appointment with them                        asap.                       - The findings and recommendations were discussed with                        the patient.                       - The findings and recommendations were discussed with                        the patient's family.                       - Return to primary care physician in 4 weeks.                       - Return to my office in 2 weeks. Procedure Code(s):    --- Professional ---                       [phone], Colonoscopy, flexible; with biopsy, single or                        multiple Diagnosis Code(s):    --- Professional ---                       K52.9, Noninfective gastroenteritis and colitis,                        unspecified                       K51.00, Ulcerative (chronic) pancolitis without                         complications CPT copyright 2018 American Medical Association. All rights reserved. The codes documented in this report are preliminary and upon coder review may  be revised to meet current compliance requirements.  [name], MD Margretta Sidle B. Bonna Gains MD, MD 01/22/2018 9:44:16 AM This report has been signed electronically. Number of Addenda: 0 Note Initiated On: 01/22/2018 8:11 AM Scope Withdrawal Time: 0 hours 10 minutes 29 seconds  Total Procedure Duration: 0 hours 14 minutes 58 seconds  Estimated Blood Loss: Estimated blood loss: none.      [facility]

## 2018-01-22 NOTE — Op Note (Signed)
[facility] Gastroenterology Patient Name: Steven Whitney Procedure Date: 01/22/2018 8:12 AM MRN: [id number] Account #: [id number] Date of Birth: 08-Jun-1960 Admit Type: Outpatient Age: 57 Room: [facility] ENDO ROOM 2 Gender: Male Note Status: Finalized Procedure:            Upper GI endoscopy Indications:          Generalized abdominal pain, Diarrhea, Weight loss Providers:            Janeal Abadi B. Bonna Gains MD, MD Referring MD:         Forest Gleason Md, MD (Referring MD) Medicines:            Monitored Anesthesia Care Complications:        No immediate complications. Procedure:            Pre-Anesthesia Assessment:                       - Prior to the procedure, a History and Physical was                        performed, and patient medications, allergies and                        sensitivities were reviewed. The patient's tolerance of                        previous anesthesia was reviewed.                       - The risks and benefits of the procedure and the                        sedation options and risks were discussed with the                        patient. All questions were answered and informed                        consent was obtained.                       - Patient identification and proposed procedure were                        verified prior to the procedure by the physician, the                        nurse, the anesthesiologist, the anesthetist and the                        technician. The procedure was verified in the procedure                        room.                       - ASA Grade Assessment: III - A patient with severe                        systemic disease.  After obtaining informed consent, the endoscope was                        passed under direct vision. Throughout the procedure,                        the patient's blood pressure, pulse, and oxygen                        saturations were monitored  continuously. The Endoscope                        was introduced through the mouth, and advanced to the                        third part of duodenum. The upper GI endoscopy was                        accomplished with ease. The patient tolerated the                        procedure well. Findings:      1 island of salmon-colored mucosa was present. No other visible       abnormalities were present. The maximum longitudinal extent of these       esophageal mucosal changes was 0.5 cm in length. Biopsies were taken       with a cold forceps for histology.      Patchy mildly erythematous mucosa without bleeding was found in the       gastric antrum. Biopsies were taken with a cold forceps for histology.       Biopsies were obtained in the gastric body, at the incisura and in the       gastric antrum with cold forceps for histology.      The duodenal bulb, second portion of the duodenum and examined duodenum       were normal. Biopsies were taken with a cold forceps for histology.       Biopesies were taken to compare to previous biopsies from last EGD (See       previous EGD report and biopsies). Impression:           - Salmon-colored mucosa suspicious for short-segment                        Barrett's esophagus. Biopsied.                       - Erythematous mucosa in the antrum. Biopsied.                       - Normal duodenal bulb, second portion of the duodenum                        and examined duodenum. Biopsied.                       - Biopsies were obtained in the gastric body, at the                        incisura and in the gastric antrum. Recommendation:       -  Await pathology results.                       - Discharge patient to home (with escort).                       - Advance diet as tolerated.                       - Continue present medications.                       - Patient has a contact number available for                        emergencies. The signs and  symptoms of potential                        delayed complications were discussed with the patient.                        Return to normal activities tomorrow. Written discharge                        instructions were provided to the patient.                       - Discharge patient to home (with escort).                       - The findings and recommendations were discussed with                        the patient.                       - The findings and recommendations were discussed with                        the patient's family. Procedure Code(s):    --- Professional ---                       [phone], Esophagogastroduodenoscopy, flexible, transoral;                        with biopsy, single or multiple Diagnosis Code(s):    --- Professional ---                       K22.8, Other specified diseases of esophagus                       K31.89, Other diseases of stomach and duodenum                       R10.84, Generalized abdominal pain                       R19.7, Diarrhea, unspecified                       R63.4, Abnormal weight loss CPT copyright 2018 American Medical Association. All rights reserved. The codes documented in this report are preliminary and upon coder review  may  be revised to meet current compliance requirements.  [name], MD Margretta Sidle B. Bonna Gains MD, MD 01/22/2018 8:31:33 AM This report has been signed electronically. Number of Addenda: 0 Note Initiated On: 01/22/2018 8:12 AM Estimated Blood Loss: Estimated blood loss: none.      [facility]

## 2018-01-22 NOTE — Anesthesia Preprocedure Evaluation (Addendum)
Anesthesia Evaluation  Patient identified by MRN, date of birth, ID band  Reviewed: Allergy & Precautions, NPO status , Patient's Chart, lab work & pertinent test results  History of Anesthesia Complications Negative for: history of anesthetic complications  Airway Mallampati: II       Dental   Pulmonary neg sleep apnea, neg COPD, former smoker,  S/p pneumothorax and pleurodiesis          Cardiovascular (-) hypertension(-) Past MI and (-) CHF (-) dysrhythmias (-) Valvular Problems/Murmurs     Neuro/Psych neg Seizures    GI/Hepatic Neg liver ROS, GERD  Medicated and Controlled,  Endo/Other  neg diabetes  Renal/GU negative Renal ROS     Musculoskeletal   Abdominal   Peds  Hematology   Anesthesia Other Findings   Reproductive/Obstetrics                            Anesthesia Physical Anesthesia Plan  ASA: III  Anesthesia Plan: General   Post-op Pain Management:    Induction: Intravenous  PONV Risk Score and Plan: TIVA and Propofol infusion  Airway Management Planned: Nasal Cannula  Additional Equipment:   Intra-op Plan:   Post-operative Plan:   Informed Consent: I have reviewed the patients History and Physical, chart, labs and discussed the procedure including the risks, benefits and alternatives for the proposed anesthesia with the patient or authorized representative who has indicated his/her understanding and acceptance.     Plan Discussed with:   Anesthesia Plan Comments:         Anesthesia Quick Evaluation

## 2018-01-22 NOTE — Transfer of Care (Signed)
Immediate Anesthesia Transfer of Care Note  Patient: Steven Whitney  Procedure(s) Performed: COLONOSCOPY WITH PROPOFOL (N/A ) ESOPHAGOGASTRODUODENOSCOPY (EGD) WITH PROPOFOL (N/A )  Patient Location: PACU and Endoscopy Unit  Anesthesia Type:General  Level of Consciousness: sedated  Airway & Oxygen Therapy: Patient Spontanous Breathing  Post-op Assessment: Report given to RN  Post vital signs: stable  Last Vitals:  Vitals Value Taken Time  BP    Temp    Pulse    Resp    SpO2      Last Pain:  Vitals:   01/22/18 0724  TempSrc: Tympanic  PainSc: 0-No pain         Complications: No apparent anesthesia complications

## 2018-01-27 ENCOUNTER — Telehealth: Payer: Self-pay | Admitting: Gastroenterology

## 2018-01-27 DIAGNOSIS — R197 Diarrhea, unspecified: Secondary | ICD-10-CM

## 2018-01-27 DIAGNOSIS — K559 Vascular disorder of intestine, unspecified: Secondary | ICD-10-CM

## 2018-01-27 DIAGNOSIS — K633 Ulcer of intestine: Secondary | ICD-10-CM

## 2018-01-27 NOTE — Telephone Encounter (Signed)
Pt wife Shirlean Mylar) notified of stat CT ordered for mesenteric ischemia and that Dr. Bonna Gains will discuss details with pt tomorrow at office visit. appt time is 9:30am; arrival time 9:15am and to pick up 2 bottles of contrast with instructions for CT tomorrow. To have nothing to eat/drink 4 hours prior to procedure.

## 2018-01-27 NOTE — Telephone Encounter (Signed)
To get labs at the hospital tomorrow after CT, wife, Shirlean Mylar aware.

## 2018-01-27 NOTE — Telephone Encounter (Signed)
Dr. Reuel Derby from pathology has reviewed the patient's slides.  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  She recommends testing him for HIV and the above with anti-RPR, antitreponemal serology.  And a rectal swab for LGV.  She states no further staining can be done for the above tests on the biopsies itself.  She also mentioned an ischemic picture on the biopsies.  I will thus order CT abdomen to evaluate for mesenteric ischemia.  Please refer to the patient's initial biopsies from January 2019 and my conversation with the pathologist at the time reported chronic changes consistent with inflammatory bowel disease on the previous colonoscopy and biopsy reports.

## 2018-01-28 ENCOUNTER — Encounter: Payer: Self-pay | Admitting: Gastroenterology

## 2018-01-28 ENCOUNTER — Ambulatory Visit
Admission: RE | Admit: 2018-01-28 | Discharge: 2018-01-28 | Disposition: A | Discharge status: Home / Self Care | Payer: BLUE CROSS/BLUE SHIELD | Source: Ambulatory Visit | Attending: Gastroenterology | Admitting: Gastroenterology

## 2018-01-28 ENCOUNTER — Ambulatory Visit: Payer: BLUE CROSS/BLUE SHIELD | Admitting: Gastroenterology

## 2018-01-28 ENCOUNTER — Other Ambulatory Visit
Admission: RE | Admit: 2018-01-28 | Discharge: 2018-01-28 | Disposition: A | Discharge status: Home / Self Care | Payer: BLUE CROSS/BLUE SHIELD | Source: Home / Self Care | Attending: Gastroenterology | Admitting: Gastroenterology

## 2018-01-28 VITALS — BP 113/72 | HR 84 | Ht 66.0 in | Wt 158.2 lb

## 2018-01-28 DIAGNOSIS — K5289 Other specified noninfective gastroenteritis and colitis: Secondary | ICD-10-CM | POA: Diagnosis not present

## 2018-01-28 DIAGNOSIS — K633 Ulcer of intestine: Secondary | ICD-10-CM

## 2018-01-28 DIAGNOSIS — K559 Vascular disorder of intestine, unspecified: Secondary | ICD-10-CM | POA: Diagnosis present

## 2018-01-28 DIAGNOSIS — R197 Diarrhea, unspecified: Secondary | ICD-10-CM

## 2018-01-28 LAB — CBC WITH DIFFERENTIAL/PLATELET
Abs Immature Granulocytes: 0.15 10*3/uL — ABNORMAL HIGH (ref 0.00–0.07)
Basophils Absolute: 0 10*3/uL (ref 0.0–0.1)
Basophils Relative: 0 %
EOS PCT: 0 %
Eosinophils Absolute: 0 10*3/uL (ref 0.0–0.5)
HCT: 45.9 % (ref 39.0–52.0)
Hemoglobin: 14 g/dL (ref 13.0–17.0)
Immature Granulocytes: 1 %
Lymphocytes Relative: 11 %
Lymphs Abs: 2.1 10*3/uL (ref 0.7–4.0)
MCH: 26.1 pg (ref 26.0–34.0)
MCHC: 30.5 g/dL (ref 30.0–36.0)
MCV: 85.6 fL (ref 80.0–100.0)
Monocytes Absolute: 0.7 10*3/uL (ref 0.1–1.0)
Monocytes Relative: 4 %
Neutro Abs: 15.8 10*3/uL — ABNORMAL HIGH (ref 1.7–7.7)
Neutrophils Relative %: 84 %
Platelets: 367 10*3/uL (ref 150–400)
RBC: 5.36 MIL/uL (ref 4.22–5.81)
RDW: 13.3 % (ref 11.5–15.5)
WBC: 18.7 10*3/uL — ABNORMAL HIGH (ref 4.0–10.5)
nRBC: 0 % (ref 0.0–0.2)

## 2018-01-28 LAB — C-REACTIVE PROTEIN: CRP: <0.8 mg/dL (ref <1.0)

## 2018-01-28 MED ORDER — IOPAMIDOL (ISOVUE-370) INJECTION 76%
100.0000 mL | Freq: Once | INTRAVENOUS | Status: AC | PRN
  Administered 2018-01-28: 100 mL via INTRAVENOUS

## 2018-01-28 NOTE — Progress Notes (Signed)
Steven Antigua, MD [address]  [place]  [place], [place] [address]  Main: [phone]  Fax: [phone]   Primary Care Physician: Center, [facility]  Primary Gastroenterologist:  Dr. Vonda Whitney  Chief Complaint  Patient presents with  . Follow-up    HPI: Steven Whitney is a 57 y.o. male here for follow-up of pancolonic ulcerative colitis.  Patient underwent EGD and colonoscopy in January 22, 2018 due to diarrhea and abdominal pain despite Humira. Colonoscopy revealed worsening disease, with patchy inflammation, shallow ulcerations throughout the colon.  Terminal ileum showed evidence of backwash ileitis with shallow ulcerations just at the entrance of the terminal ileum, and otherwise normal terminal ileum proximal to that area.  Pt was started on 6-MP 50 mg daily on 01/22/18 along with prednisone taper (currently 17m daily).  He is also on Humira every 2 weeks.  As of the last 3 days he reports improved symptoms with less frequency of bowel movements.  No further blood streaks for the last 3 days.  No fever or chills.  No signs of infection.  No abdominal pain.  CRP today is completely normal, compared to 43 on 01/22/2018.  WBC count is 18.7 which is due to prednisone use, and has improved from 19.76 days ago.  Humira antibody level is pending.  Humira drug level is 4.6.  EGD showed salmon-colored mucosa at the GE junction, 1 small island.  Gastric erythema also noted.  DIAGNOSIS:  A. DUODENUM; COLD BIOPSY:  - UNREMARKABLE DUODENAL MUCOSA.   B. STOMACH; COLD BIOPSY:  - ANTRAL MUCOSA WITH HEALING EROSIVE AND REACTIVE GASTRITIS.  - OXYNTIC MUCOSA WITH FOCAL MILD CHRONIC GASTRITIS AND CHANGES  CONSISTENT WITH PROTON PUMP INHIBITOR EFFECT.  - IHC STAIN FOR HELICOBACTER IS NEGATIVE.  - NEGATIVE FOR DYSPLASIA AND MALIGNANCY.   C. GEJ; COLD BIOPSY:  - SQUAMOCOLUMNAR MUCOSA WITH REFLUX GASTROESOPHAGITIS.  - SUBMUCOSAL GLAND WITH FOCAL  ACUTE INFLAMMATION.  - NEGATIVE FOR INTESTINAL METAPLASIA, DYSPLASIA, AND MALIGNANCY.  - DEEPER SECTIONS EXAMINED.   D. TERMINAL ILEUM; COLD BIOPSY:  - MODERATE ACTIVE ENTERITIS WITH ULCERATION AND ISCHEMIC CHANGE.  - NEGATIVE FOR VIRAL CYTOPATHIC EFFECT, FEATURES OF CHRONICITY,  DYSPLASIA, AND MALIGNANCY.   E. COLON, CECUM, ASCENDING, TRANSVERSE; COLD BIOPSY:  - MARKED CHRONIC ACTIVE COLITIS WITH ISCHEMIA AND ULCERATION.  - IHC FOR CMV IS NEGATIVE.  - NEGATIVE FOR GRANULOMAS, DYSPLASIA, AND MALIGNANCY.   F. COLON, DESCENDING AND SIGMOID; COLD BIOPSY:  - MARKED CHRONIC ACTIVE COLITIS WITH ISCHEMIA AND ULCERATION.  - IHC FOR CMV IS NEGATIVE.  - NEGATIVE FOR GRANULOMAS, DYSPLASIA, AND MALIGNANCY.   G. RECTUM; COLD BIOPSY:  - MARKED CHRONIC ACTIVE PROCTITIS WITH ISCHEMIA AND ULCERATION.  - IHC FOR CMV IS NEGATIVE.  - NEGATIVE FOR GRANULOMAS, DYSPLASIA, AND MALIGNANCY.   Comment:  [facility]  with Dr. TBonna Gainson 01/27/2018. Given the clinical setting and  histologic findings syphilitic and lymphogranuloma venereum  proctocolitis should be reasonably excluded with clinical serologies  (syphilis) and rectal swab for Chlamydia trachomatis nucleic acid probe  test.     Have discussed the pathology report with Dr. RReuel Derbyand she recommended further work-up for syphilis, HIV, lymphogranuloma venereum as noted on her pathology comments.  This is because she does these disorders can present with similar findings to IBD.  These tests have been ordered and results are pending.  Patient also underwent CTA today to evaluate for mesenteric ischemia.  This was negative  for any evidence of mesenteric ischemia.  Findings were consistent with acute inflammation from inflammatory bowel disease.  Hypodense liver lesion 2.3 cm was reported and further evaluation with MRI is recommended.  Current Outpatient Medications  Medication Sig  Dispense Refill  . HUMIRA PEN-CD/UC/HS STARTER 80 MG/0.8ML PNKT   0  . LYRICA 225 MG capsule Take 225 mg by mouth 2 (two) times daily.  0  . mercaptopurine (PURINETHOL) 50 MG tablet Take 1 tablet (50 mg total) by mouth daily. Give on an empty stomach 1 hour before or 2 hours after meals. Caution: Chemotherapy. 30 tablet 1  . methocarbamol (ROBAXIN) 500 MG tablet   0  . nystatin cream (MYCOSTATIN) APPLY TO THE AFFECTED AREA TID  1  . omeprazole (PRILOSEC) 20 MG capsule Take 20 mg by mouth daily.    . predniSONE (DELTASONE) 10 MG tablet Take 6 tablets (60 mg total) by mouth daily with breakfast. 180 tablet 0  . psyllium (METAMUCIL) 58.6 % powder Take 1 packet by mouth daily.    . Mesalamine (ASACOL) 400 MG CPDR DR capsule Take 4 capsules (1,600 mg total) by mouth 3 (three) times daily. 360 capsule 3  . Mesalamine 4 GM/60ML SF ENEM Place 1 enema rectally 2 (two) times daily. 1800 enema 3   No current facility-administered medications for this visit.     Allergies as of 01/28/2018  . (No Known Allergies)    ROS:  General: Negative for anorexia, weight loss, fever, chills, fatigue, weakness. ENT: Negative for hoarseness, difficulty swallowing , nasal congestion. CV: Negative for chest pain, angina, palpitations, dyspnea on exertion, peripheral edema.  Respiratory: Negative for dyspnea at rest, dyspnea on exertion, cough, sputum, wheezing.  GI: See history of present illness. GU:  Negative for dysuria, hematuria, urinary incontinence, urinary frequency, nocturnal urination.  Endo: Negative for unusual weight change.    Physical Examination:   BP 113/72   Pulse 84   Ht 5' 6"  (1.676 m)   Wt 158 lb 3.2 oz (71.8 kg)   BMI 25.53 kg/m   General: Well-nourished, well-developed in no acute distress.  Eyes: No icterus. Conjunctivae pink. Mouth: Oropharyngeal mucosa moist and pink , no lesions erythema or exudate. Neck: Supple, Trachea midline Abdomen: Bowel sounds are normal, nontender,  nondistended, no hepatosplenomegaly or masses, no abdominal bruits or hernia , no rebound or guarding.   Extremities: No lower extremity edema. No clubbing or deformities. Neuro: Alert and oriented x 3.  Grossly intact. Skin: Warm and dry, no jaundice.   Psych: Alert and cooperative, normal mood and affect.   Labs: CMP     Component Value Date/Time   NA 142 01/22/2018 1107   K 5.2 01/22/2018 1107   CL 105 01/22/2018 1107   CO2 21 01/22/2018 1107   GLUCOSE 104 (H) 01/22/2018 1107   GLUCOSE 111 (H) 01/07/2018 1040   BUN 9 01/22/2018 1107   CREATININE 1.10 01/22/2018 1107   CALCIUM 9.8 01/22/2018 1107   PROT 6.9 01/22/2018 1107   ALBUMIN 3.8 01/22/2018 1107   AST 19 01/22/2018 1107   ALT 25 01/22/2018 1107   ALKPHOS 95 01/22/2018 1107   BILITOT <0.2 01/22/2018 1107   GFRNONAA 74 01/22/2018 1107   GFRAA 85 01/22/2018 1107   Lab Results  Component Value Date   WBC 18.7 (H) 01/28/2018   HGB 14.0 01/28/2018   HCT 45.9 01/28/2018   MCV 85.6 01/28/2018   PLT 367 01/28/2018    Imaging Studies: Ct Angio Abd/pel W/ And/or W/o  Result Date: 01/28/2018 CLINICAL DATA:  57 year old male with a history of ulcerative colitis diagnosis. Diarrhea EXAM: CTA ABDOMEN AND PELVIS wITHOUT AND WITH CONTRAST TECHNIQUE: Multidetector CT imaging of the abdomen and pelvis was performed using the standard protocol during bolus administration of intravenous contrast. Multiplanar reconstructed images and MIPs were obtained and reviewed to evaluate the vascular anatomy. CONTRAST:  115m ISOVUE-370 IOPAMIDOL (ISOVUE-370) INJECTION 76% COMPARISON:  06/19/2004 FINDINGS: VASCULAR Aorta: Minimal atherosclerotic changes of the infrarenal abdominal aorta. No aneurysm. No periaortic fluid or wall thickening. No dissection. Celiac: Minimal atherosclerosis at the celiac artery origin without stenosis. Typical branching to splenic artery, common hepatic artery, left gastric artery. Branches are patent SMA: Minimal  atherosclerotic changes at the SMA origin without stenosis. Replaced right hepatic artery from the SMA. Branches are patent. Renals: The main right renal artery demonstrates normal course caliber and contour. Minimal atherosclerotic changes at the origin without stenosis. There is accessory right renal artery originating at the 9 o'clock position just below the main renal artery. Mild atherosclerotic changes at the origin of the left main renal artery without significant stenosis. Normal course caliber and contour. IMA: IMA is patent. Right lower extremity: Minimal atherosclerotic changes of the right iliac system. No aneurysm, dissection. Mild tortuosity. Hypogastric artery is patent. Common femoral artery patent. Proximal profunda femoris and SFA patent. Left lower extremity: Minimal atherosclerotic changes of the left iliac system. Mild tortuosity. Hypogastric arteries patent. No aneurysm or dissection. Common femoral artery is patent. Proximal profunda femoris and SFA are patent. Veins: accessory left renal draining vein to the left common iliac vein. Unremarkable IVC. Unremarkable portal system Review of the MIP images confirms the above findings. NON-VASCULAR Lower chest: Mild atelectasis scarring at the lung bases. There is a small nodule at the periphery of the left lower lobe on image 32 of series 7 which is unchanged from the CT of 2006 and most likely benign given the unchanged size. Hepatobiliary: The portal venous phase demonstrates a hypodense lesion adjacent to the gallbladder fossa in segment 4 B measuring 17 mm by 23 mm on image 21 of series 16. This was not visualized on the comparison CT. There is no correlate on the arterial phase. No cirrhotic changes of the liver. Unremarkable gallbladder.  Unremarkable pancreas Pancreas: Unremarkable spleen Spleen: Unremarkable. Adrenals/Urinary Tract: Unremarkable appearance of adrenal glands. Right: No hydronephrosis. Symmetric perfusion to the left. No  nephrolithiasis. Unremarkable course of the right ureter. Left: No hydronephrosis. Symmetric perfusion to the right. No nephrolithiasis. Unremarkable course of the left ureter. Slight mild rotation of the left kidney Unremarkable appearance of the urinary bladder . Stomach/Bowel: Unremarkable appearance of the stomach. Unremarkable appearance of small bowel. No evidence of obstruction. Appendix is normal, however with a calcified appendicoliths. Cecum and ascending colon unremarkable. Hepatic flexure unremarkable. There is a transition in the transverse colon in the mid segment from a more normal appearance of the mucosa and wall proximally to circumferential thickening and hyperenhancement of the mid segment. There is associated narrowing of the lumen at this site which then transitions to a more normal caliber at the splenic flexure. Descending colon demonstrates mild mucosal enhancement with evidence of a comb sign/hypervascularity. No narrowing of the descending colon. Sigmoid colon demonstrates wall thickening circumferentially and hyperenhancing mucosa with a comb sign/hypervascularity. Lymphatic: No lymphadenopathy. Mesenteric: No free fluid or air. No adenopathy. Reproductive: Unremarkable appearance of the prostate. Small calcified focus in the rectovesical space, potentially a remnant of a prior torsed appendage/appendagitis. Other: No hernia. Musculoskeletal:  No significant sclerotic changes of the sacroiliac joint. No acute displaced fracture. Mild degenerative changes of the spine. Vacuum disc phenomenon at L5-S1 where there is the most pronounced disc space narrowing. No bony canal narrowing. Unremarkable appearance of the bilateral hips. IMPRESSION: Findings of acute inflammatory bowel disease, most pronounced in a segment of transverse colon and the sigmoid colon, though also involving the descending colon, as manifested by mucosal hyperenhancement and comb sign/hypervascularity. The segment of  transverse colon that is pathologic on today's CT is narrowed, and a chronic stricture at this site can not be excluded. This finding is new from the comparison CT of 2006. There is a hypodense lesion of segment 4B in the left liver measuring 2.3 cm. This finding is only present on the delayed images on today's CT, and was not present on the comparison CT. Further evaluation with contrast-enhanced MRI is recommended, as the differential includes both benign and malignant entities, as well as possibly abscess. Aortic Atherosclerosis (ICD10-I70.0). Signed, Dulcy Fanny. Dellia Nims, RPVI [facility] Radiology Electronically Signed   By: Corrie Mckusick D.O.   On: 01/28/2018 11:14    Assessment and Plan:   NTHONY LEFFERTS is a 57 y.o. y/o male who pancolonic ulcerative colitis with recent worsening based on colonoscopy here for follow-up  Improvement in symptoms over the last 3 days, and now normalized CRP is all consistent with response to therapy.  However, given endoscopic worsening of disease, and need to take patient off prednisone, he will likely need change in therapy or increase in Humira dosing, to keep him in remission off prednisone  We will await Humira antibody drug level If none present, can increase Humira to once weekly due to endoscopically worsening disease Patient to start tapering prednisone, change to prednisone 40 mg daily starting tomorrow and then follow-up in clinic in 1 to 2 weeks to reassess symptoms and further taper prednisone  We will also await pending HIV, RPR, anti-treponemal antibody serology to rule out infectious causes of colitis, suggested by Dr. Reuel Derby, prior to increasing Humira to once weekly dosing  If patient develops worsening symptoms, he was asked to come to the ER or call us immediately and he verbalized understanding  CTA ruled out mesenteric ischemia    Dr Steven Whitney

## 2018-01-29 ENCOUNTER — Telehealth: Payer: Self-pay | Admitting: Gastroenterology

## 2018-01-29 ENCOUNTER — Telehealth: Payer: Self-pay

## 2018-01-29 LAB — COMPREHENSIVE METABOLIC PANEL
ALT: 25 IU/L (ref 0–44)
AST: 19 IU/L (ref 0–40)
Albumin/Globulin Ratio: 1.2 (ref 1.2–2.2)
Albumin: 3.8 g/dL (ref 3.5–5.5)
Alkaline Phosphatase: 95 IU/L (ref 39–117)
BUN/Creatinine Ratio: 8 — ABNORMAL LOW (ref 9–20)
BUN: 9 mg/dL (ref 6–24)
Bilirubin Total: <0.2 mg/dL (ref 0.0–1.2)
CO2: 21 mmol/L (ref 20–29)
Calcium: 9.8 mg/dL (ref 8.7–10.2)
Chloride: 105 mmol/L (ref 96–106)
Creatinine, Ser: 1.1 mg/dL (ref 0.76–1.27)
GFR calc Af Amer: 85 mL/min/{1.73_m2} (ref >59)
GFR calc non Af Amer: 74 mL/min/{1.73_m2} (ref >59)
GLOBULIN, TOTAL: 3.1 g/dL (ref 1.5–4.5)
Glucose: 104 mg/dL — ABNORMAL HIGH (ref 65–99)
Potassium: 5.2 mmol/L (ref 3.5–5.2)
SODIUM: 142 mmol/L (ref 134–144)
Total Protein: 6.9 g/dL (ref 6.0–8.5)

## 2018-01-29 LAB — SURGICAL PATHOLOGY

## 2018-01-29 LAB — CBC
Hematocrit: 47.6 % (ref 37.5–51.0)
Hemoglobin: 15.6 g/dL (ref 13.0–17.7)
MCH: 26.4 pg — ABNORMAL LOW (ref 26.6–33.0)
MCHC: 32.8 g/dL (ref 31.5–35.7)
MCV: 81 fL (ref 79–97)
PLATELETS: 463 10*3/uL — AB (ref 150–450)
RBC: 5.91 x10E6/uL — ABNORMAL HIGH (ref 4.14–5.80)
RDW: 13.1 % (ref 12.3–15.4)
WBC: 19.7 10*3/uL — ABNORMAL HIGH (ref 3.4–10.8)

## 2018-01-29 LAB — C-REACTIVE PROTEIN: CRP: 43 mg/L — ABNORMAL HIGH (ref 0–10)

## 2018-01-29 LAB — HIV ANTIBODY (ROUTINE TESTING W REFLEX): HIV Screen 4th Generation wRfx: NONREACTIVE

## 2018-01-29 LAB — SERIAL MONITORING

## 2018-01-29 LAB — RPR: RPR Ser Ql: NONREACTIVE

## 2018-01-29 LAB — ADALIMUMAB+AB (SERIAL MONITOR)
Adalimumab Drug Level: 4.6 ug/mL
Anti-Adalimumab Antibody: 327 ng/mL

## 2018-01-29 LAB — FLUORESCENT TREPONEMAL AB(FTA)-IGG-BLD: Fluorescent Treponemal Ab, IgG: NONREACTIVE

## 2018-01-29 NOTE — Telephone Encounter (Signed)
Pt was notified. I asked pt when he got his new card to get [name] a copy asap so we can get started with his [facility] authorization. Pt states he understood.

## 2018-01-29 NOTE — Telephone Encounter (Signed)
error 

## 2018-01-29 NOTE — Telephone Encounter (Signed)
Opened in error

## 2018-01-29 NOTE — Telephone Encounter (Signed)
[  01/29/2018 12:05 PM]  Kae Heller:   Spoke to [place] B. at [facility] and Dr. Bonna Gains is in network effective 03/12/17 to current.

## 2018-01-29 NOTE — Telephone Encounter (Signed)
-----   Message from Steven Manifold, Steven Whitney sent at 01/29/2018  1:08 PM EST ----- [name] please let patient know,  1. his HIV and one of the syphilis tests were negative. The rest are pending.  2. His blood work shows that he has antibodies to the Humira and that is likely why he is not responding to the medication well.  3. We will be changing it over to Entyvio ( IV: 300 mg at 0, 2, and 6 weeks and then every 8 weeks thereafter) 4. He is to continue to Humira and mercaptopurine until we get the [facility] approved. 5. Take Prednisone 65m once daily instead of 621mand continue that dose until his next clinic appointment 6. Please take the Asacol and mesalamine enema off his list as they are showing active and he was asked to discontinue these.

## 2018-01-30 ENCOUNTER — Telehealth: Payer: Self-pay

## 2018-01-30 ENCOUNTER — Encounter: Payer: Self-pay | Admitting: Gastroenterology

## 2018-01-30 DIAGNOSIS — K769 Liver disease, unspecified: Secondary | ICD-10-CM

## 2018-01-30 NOTE — Telephone Encounter (Signed)
Left pt message that Dr. Bonna Gains did want a MRI and I will order this and let him know Monday when.

## 2018-01-30 NOTE — Telephone Encounter (Signed)
-----   Message from Virgel Manifold, MD sent at 01/30/2018  8:54 AM EST ----- Can you order MRI abdomen, evaluate liver lesion.  ----- Message ----- From: Martie Lee, LPN Sent: [phone]   4:29 PM EST To: Virgel Manifold, MD  Pt aware. He also asked about a MRI due to liver lesion? I said I  Would check with you.

## 2018-01-31 LAB — CT NAA, RECTAL: Chlamydia trachomatis, NAA: NEGATIVE

## 2018-02-02 ENCOUNTER — Other Ambulatory Visit: Payer: Self-pay

## 2018-02-02 ENCOUNTER — Telehealth: Payer: Self-pay

## 2018-02-02 NOTE — Telephone Encounter (Signed)
MRI is scheduled for 02/09/2018 at 10 am, arrival time 9:30 am at [facility]. Nothing to eat or drink after midnight. Pt is concerned about his insurance paying. He will most likely owe over $2000 for his recent CT scan. I told him that office calls to see if it is covered but to what amount we will not know. He states he understands and to let him know after we call whether or not it is approved and he will take care of the rest. Will send message to Ginger to let her know.

## 2018-02-09 ENCOUNTER — Ambulatory Visit
Admission: RE | Admit: 2018-02-09 | Discharge: 2018-02-09 | Disposition: A | Discharge status: Home / Self Care | Payer: BLUE CROSS/BLUE SHIELD | Source: Ambulatory Visit | Attending: Gastroenterology | Admitting: Gastroenterology

## 2018-02-09 DIAGNOSIS — K769 Liver disease, unspecified: Secondary | ICD-10-CM | POA: Diagnosis present

## 2018-02-09 LAB — POCT I-STAT CREATININE: Creatinine, Ser: 0.9 mg/dL (ref 0.61–1.24)

## 2018-02-09 MED ORDER — GADOBUTROL 1 MMOL/ML IV SOLN
7.0000 mL | Freq: Once | INTRAVENOUS | Status: AC | PRN
  Administered 2018-02-09: 7 mL via INTRAVENOUS

## 2018-02-12 ENCOUNTER — Ambulatory Visit: Payer: BLUE CROSS/BLUE SHIELD | Admitting: Gastroenterology

## 2018-02-12 ENCOUNTER — Encounter: Payer: Self-pay | Admitting: Gastroenterology

## 2018-02-12 VITALS — BP 115/74 | HR 68 | Ht 68.0 in | Wt 166.2 lb

## 2018-02-12 DIAGNOSIS — K5100 Ulcerative (chronic) pancolitis without complications: Secondary | ICD-10-CM | POA: Diagnosis not present

## 2018-02-12 NOTE — Progress Notes (Signed)
[name], MD [address]  [place]  [place], [place] [address]  Main: [phone]  Fax: [phone]   Primary Care Physician: Center, [facility]  Primary Gastroenterologist:  Dr. [name]  Chief Complaint  Patient presents with  . Follow-up    ulcerative    HPI: Steven Whitney is a 58 y.o. male here for follow-up of pancolonic ulcerative colitis.  Patient now reports much improvement in symptoms since being on prednisone 40 mg daily, Humira 40 mg every other week, 6-MP 50 mg daily.  Now reports 3-4 formed bowel movements a day.  Improved appetite.  No further blood streaks in stool.  No abdominal pain.  Entyvio approval process is underway.  Patient underwent EGD and colonoscopy in January 22, 2018 due to diarrhea and abdominal pain despite Humira. Colonoscopy revealed worsening disease, with patchy inflammation, shallow ulcerations throughout the colon.  Terminal ileum showed evidence of backwash ileitis with shallow ulcerations just at the entrance of the terminal ileum, and otherwise normal terminal ileum proximal to that area.  Pt was started on 6-MP 50 mg daily on 01/22/18 along with prednisone taper.  He is also on Humira every 2 weeks. Humira antibody level is elevated to the 300s on last check.  Humira drug level is 4.6.  EGD showed salmon-colored mucosa at the GE junction, 1 small island.  Gastric erythema also noted.  DIAGNOSIS:  A. DUODENUM; COLD BIOPSY:  - UNREMARKABLE DUODENAL MUCOSA.   B. STOMACH; COLD BIOPSY:  - ANTRAL MUCOSA WITH HEALING EROSIVE AND REACTIVE GASTRITIS.  - OXYNTIC MUCOSA WITH FOCAL MILD CHRONIC GASTRITIS AND CHANGES  CONSISTENT WITH PROTON PUMP INHIBITOR EFFECT.  - IHC STAIN FOR HELICOBACTER IS NEGATIVE.  - NEGATIVE FOR DYSPLASIA AND MALIGNANCY.   C. GEJ; COLD BIOPSY:  - SQUAMOCOLUMNAR MUCOSA WITH REFLUX GASTROESOPHAGITIS.  - SUBMUCOSAL GLAND WITH FOCAL ACUTE INFLAMMATION.  -  NEGATIVE FOR INTESTINAL METAPLASIA, DYSPLASIA, AND MALIGNANCY.  - DEEPER SECTIONS EXAMINED.   D. TERMINAL ILEUM; COLD BIOPSY:  - MODERATE ACTIVE ENTERITIS WITH ULCERATION AND ISCHEMIC CHANGE.  - NEGATIVE FOR VIRAL CYTOPATHIC EFFECT, FEATURES OF CHRONICITY,  DYSPLASIA, AND MALIGNANCY.   E. COLON, CECUM, ASCENDING, TRANSVERSE; COLD BIOPSY:  - MARKED CHRONIC ACTIVE COLITIS WITH ISCHEMIA AND ULCERATION.  - IHC FOR CMV IS NEGATIVE.  - NEGATIVE FOR GRANULOMAS, DYSPLASIA, AND MALIGNANCY.   F. COLON, DESCENDING AND SIGMOID; COLD BIOPSY:  - MARKED CHRONIC ACTIVE COLITIS WITH ISCHEMIA AND ULCERATION.  - IHC FOR CMV IS NEGATIVE.  - NEGATIVE FOR GRANULOMAS, DYSPLASIA, AND MALIGNANCY.   G. RECTUM; COLD BIOPSY:  - MARKED CHRONIC ACTIVE PROCTITIS WITH ISCHEMIA AND ULCERATION.  - IHC FOR CMV IS NEGATIVE.  - NEGATIVE FOR GRANULOMAS, DYSPLASIA, AND MALIGNANCY.   Comment:  Thepatient's history was reviewed in [facility] and this case was discussed  with Dr. Bonna Gains on 01/27/2018. Given the clinical setting and  histologic findings syphilitic and lymphogranuloma venereum  proctocolitis should be reasonably excluded with clinical serologies  (syphilis) and rectal swab for Chlamydia trachomatis nucleic acid probe  test.     Have discussed the pathology report with Dr. Reuel Derby and she recommended further work-up for syphilis, HIV, lymphogranuloma venereum as noted on her pathology comments.  This is because she does these disorders can present with similar findings to IBD.  These tests were ordered and were negative.  Patient also underwent CTA today to evaluate for mesenteric ischemia.  This was negative for any evidence of mesenteric ischemia.  Findings were consistent  with acute inflammation from inflammatory bowel disease.  Hypodense liver lesion 2.3 cm was reported and further evaluation with MRI is recommended.  MRI was done, and the lesion was reported to be benign. Talked to the radiologist Dr.  Weber Cooks who states, the lesion appears to be benign and most correlating with focal fat. It does not appear to be a cyst on the exam. He recommends a a 6 mo follow up.  Current Outpatient Medications  Medication Sig Dispense Refill  . HUMIRA PEN-CD/UC/HS STARTER 80 MG/0.8ML PNKT   0  . LYRICA 225 MG capsule Take 225 mg by mouth 2 (two) times daily.  0  . mercaptopurine (PURINETHOL) 50 MG tablet Take 1 tablet (50 mg total) by mouth daily. Give on an empty stomach 1 hour before or 2 hours after meals. Caution: Chemotherapy. 30 tablet 1  . omeprazole (PRILOSEC) 20 MG capsule Take 20 mg by mouth daily.    . predniSONE (DELTASONE) 10 MG tablet Take 6 tablets (60 mg total) by mouth daily with breakfast. 180 tablet 0  . methocarbamol (ROBAXIN) 500 MG tablet   0  . nystatin cream (MYCOSTATIN) APPLY TO THE AFFECTED AREA TID  1  . psyllium (METAMUCIL) 58.6 % powder Take 1 packet by mouth daily.     No current facility-administered medications for this visit.     Allergies as of 02/12/2018  . (No Known Allergies)    ROS:  General: Negative for anorexia, weight loss, fever, chills, fatigue, weakness. ENT: Negative for hoarseness, difficulty swallowing , nasal congestion. CV: Negative for chest pain, angina, palpitations, dyspnea on exertion, peripheral edema.  Respiratory: Negative for dyspnea at rest, dyspnea on exertion, cough, sputum, wheezing.  GI: See history of present illness. GU:  Negative for dysuria, hematuria, urinary incontinence, urinary frequency, nocturnal urination.  Endo: Negative for unusual weight change.    Physical Examination:   BP 115/74   Pulse 68   Ht 5' 8"  (1.727 m)   Wt 166 lb 3.2 oz (75.4 kg)   BMI 25.27 kg/m   General: Well-nourished, well-developed in no acute distress.  Eyes: No icterus. Conjunctivae pink. Mouth: Oropharyngeal mucosa moist and pink , no lesions erythema or exudate. Neck: Supple, Trachea midline Abdomen: Bowel sounds are normal,  nontender, nondistended, no hepatosplenomegaly or masses, no abdominal bruits or hernia , no rebound or guarding.   Extremities: No lower extremity edema. No clubbing or deformities. Neuro: Alert and oriented x 3.  Grossly intact. Skin: Warm and dry, no jaundice.   Psych: Alert and cooperative, normal mood and affect.   Labs: CMP     Component Value Date/Time   NA 142 01/22/2018 1107   K 5.2 01/22/2018 1107   CL 105 01/22/2018 1107   CO2 21 01/22/2018 1107   GLUCOSE 104 (H) 01/22/2018 1107   GLUCOSE 111 (H) 01/07/2018 1040   BUN 9 01/22/2018 1107   CREATININE 0.90 02/09/2018 1013   CALCIUM 9.8 01/22/2018 1107   PROT 6.9 01/22/2018 1107   ALBUMIN 3.8 01/22/2018 1107   AST 19 01/22/2018 1107   ALT 25 01/22/2018 1107   ALKPHOS 95 01/22/2018 1107   BILITOT <0.2 01/22/2018 1107   GFRNONAA 74 01/22/2018 1107   GFRAA 85 01/22/2018 1107   Lab Results  Component Value Date   WBC 18.7 (H) 01/28/2018   HGB 14.0 01/28/2018   HCT 45.9 01/28/2018   MCV 85.6 01/28/2018   PLT 367 01/28/2018    Imaging Studies: Mr Abdomen W Wo Contrast  Result  Date: 02/09/2018 CLINICAL DATA:  58 year old male with history of liver lesion noted on prior CT examination. Follow-up study for characterization. EXAM: MRI ABDOMEN WITHOUT AND WITH CONTRAST TECHNIQUE: Multiplanar multisequence MR imaging of the abdomen was performed both before and after the administration of intravenous contrast. CONTRAST:  7 mL of Gadavist. COMPARISON:  CT the abdomen and pelvis 01/28/2018. No prior abdominal MRI. FINDINGS: Lower chest: Unremarkable. Hepatobiliary: The lesion of concern on the prior CTA examination is isointense on nearly all pulse sequences. This lesion is relatively obscured on out of phase dual echo images, apparently from some respiratory motion. On precontrast VIBE imaging (an out of phase sequence) this lesion is slightly T1 hypointense. This lesion remains hypointense to normal hepatic parenchyma on early  post gadolinium imaging, but is very ill-defined only measuring approximately 1.5 cm in diameter (axial image 41 of series 11) and becomes isointense on more delayed phase imaging. No other suspicious hepatic lesions are noted. No intra or extrahepatic biliary ductal dilatation. Gallbladder is normal in appearance. Pancreas: No pancreatic mass. No pancreatic ductal dilatation. No pancreatic or peripancreatic fluid or inflammatory changes. Spleen:  Unremarkable. Adrenals/Urinary Tract: Bilateral kidneys and bilateral adrenal glands are normal in appearance in the visualized abdomen. No hydroureteronephrosis noted in the visualized portions of the abdomen. Stomach/Bowel: Visualized portions are unremarkable. Vascular/Lymphatic: No aneurysm identified in the visualized abdominal vasculature. No lymphadenopathy noted in the visualized abdomen. Other: No significant volume of ascites noted in the visualized portions of the peritoneal cavity. Musculoskeletal: No aggressive appearing osseous lesions are noted in the visualized portions of the skeleton. IMPRESSION: 1. The lesion of concern in segment 4B of the liver has unusual imaging characteristics. Overall, this is strongly favored to be benign, potentially an area of focal fatty infiltration. Attention on repeat abdominal MRI with and without IV gadolinium is recommended in 6 months to ensure stability of this finding. Electronically Signed   By: Vinnie Langton M.D.   On: 02/09/2018 11:59   Ct Angio Abd/pel W/ And/or W/o  Result Date: 01/28/2018 CLINICAL DATA:  58 year old male with a history of ulcerative colitis diagnosis. Diarrhea EXAM: CTA ABDOMEN AND PELVIS wITHOUT AND WITH CONTRAST TECHNIQUE: Multidetector CT imaging of the abdomen and pelvis was performed using the standard protocol during bolus administration of intravenous contrast. Multiplanar reconstructed images and MIPs were obtained and reviewed to evaluate the vascular anatomy. CONTRAST:  119m  ISOVUE-370 IOPAMIDOL (ISOVUE-370) INJECTION 76% COMPARISON:  06/19/2004 FINDINGS: VASCULAR Aorta: Minimal atherosclerotic changes of the infrarenal abdominal aorta. No aneurysm. No periaortic fluid or wall thickening. No dissection. Celiac: Minimal atherosclerosis at the celiac artery origin without stenosis. Typical branching to splenic artery, common hepatic artery, left gastric artery. Branches are patent SMA: Minimal atherosclerotic changes at the SMA origin without stenosis. Replaced right hepatic artery from the SMA. Branches are patent. Renals: The main right renal artery demonstrates normal course caliber and contour. Minimal atherosclerotic changes at the origin without stenosis. There is accessory right renal artery originating at the 9 o'clock position just below the main renal artery. Mild atherosclerotic changes at the origin of the left main renal artery without significant stenosis. Normal course caliber and contour. IMA: IMA is patent. Right lower extremity: Minimal atherosclerotic changes of the right iliac system. No aneurysm, dissection. Mild tortuosity. Hypogastric artery is patent. Common femoral artery patent. Proximal profunda femoris and SFA patent. Left lower extremity: Minimal atherosclerotic changes of the left iliac system. Mild tortuosity. Hypogastric arteries patent. No aneurysm or dissection. Common femoral artery is  patent. Proximal profunda femoris and SFA are patent. Veins: accessory left renal draining vein to the left common iliac vein. Unremarkable IVC. Unremarkable portal system Review of the MIP images confirms the above findings. NON-VASCULAR Lower chest: Mild atelectasis scarring at the lung bases. There is a small nodule at the periphery of the left lower lobe on image 32 of series 7 which is unchanged from the CT of 2006 and most likely benign given the unchanged size. Hepatobiliary: The portal venous phase demonstrates a hypodense lesion adjacent to the gallbladder fossa  in segment 4 B measuring 17 mm by 23 mm on image 21 of series 16. This was not visualized on the comparison CT. There is no correlate on the arterial phase. No cirrhotic changes of the liver. Unremarkable gallbladder.  Unremarkable pancreas Pancreas: Unremarkable spleen Spleen: Unremarkable. Adrenals/Urinary Tract: Unremarkable appearance of adrenal glands. Right: No hydronephrosis. Symmetric perfusion to the left. No nephrolithiasis. Unremarkable course of the right ureter. Left: No hydronephrosis. Symmetric perfusion to the right. No nephrolithiasis. Unremarkable course of the left ureter. Slight mild rotation of the left kidney Unremarkable appearance of the urinary bladder . Stomach/Bowel: Unremarkable appearance of the stomach. Unremarkable appearance of small bowel. No evidence of obstruction. Appendix is normal, however with a calcified appendicoliths. Cecum and ascending colon unremarkable. Hepatic flexure unremarkable. There is a transition in the transverse colon in the mid segment from a more normal appearance of the mucosa and wall proximally to circumferential thickening and hyperenhancement of the mid segment. There is associated narrowing of the lumen at this site which then transitions to a more normal caliber at the splenic flexure. Descending colon demonstrates mild mucosal enhancement with evidence of a comb sign/hypervascularity. No narrowing of the descending colon. Sigmoid colon demonstrates wall thickening circumferentially and hyperenhancing mucosa with a comb sign/hypervascularity. Lymphatic: No lymphadenopathy. Mesenteric: No free fluid or air. No adenopathy. Reproductive: Unremarkable appearance of the prostate. Small calcified focus in the rectovesical space, potentially a remnant of a prior torsed appendage/appendagitis. Other: No hernia. Musculoskeletal: No significant sclerotic changes of the sacroiliac joint. No acute displaced fracture. Mild degenerative changes of the spine. Vacuum  disc phenomenon at L5-S1 where there is the most pronounced disc space narrowing. No bony canal narrowing. Unremarkable appearance of the bilateral hips. IMPRESSION: Findings of acute inflammatory bowel disease, most pronounced in a segment of transverse colon and the sigmoid colon, though also involving the descending colon, as manifested by mucosal hyperenhancement and comb sign/hypervascularity. The segment of transverse colon that is pathologic on today's CT is narrowed, and a chronic stricture at this site can not be excluded. This finding is new from the comparison CT of 2006. There is a hypodense lesion of segment 4B in the left liver measuring 2.3 cm. This finding is only present on the delayed images on today's CT, and was not present on the comparison CT. Further evaluation with contrast-enhanced MRI is recommended, as the differential includes both benign and malignant entities, as well as possibly abscess. Aortic Atherosclerosis (ICD10-I70.0). Signed, Dulcy Fanny. Dellia Nims, RPVI [facility] Electronically Signed   By: Corrie Mckusick D.O.   On: 01/28/2018 11:14    Assessment and Plan:   MARTAVIUS LUSTY is a 58 y.o. y/o male here for follow-up of pancolonic ulcerative colitis  Patient now clinically feeling much better We will recheck labs today and if CRP remains normal, can decrease prednisone dose from 40 to 30  Continue mercaptopurine  We will recheck Humira  drug and antibody level, since patient reports significant clinical improvement and to see if the results are any different now that he has been on mercaptopurine as of December 12th   If antibody levels are still high, we will plan on switching to [facility].  Entyvio approval process has been initiated and we are still awaiting approval from insurance.  We will check fecal calprotectin as well  No extraintestinal manifestations at this time   Dr [name]

## 2018-02-13 ENCOUNTER — Other Ambulatory Visit: Payer: Self-pay

## 2018-02-13 ENCOUNTER — Telehealth: Payer: Self-pay

## 2018-02-13 ENCOUNTER — Telehealth: Payer: Self-pay | Admitting: Gastroenterology

## 2018-02-13 MED ORDER — PREDNISONE 10 MG PO TABS: 30.0000 mg | ORAL_TABLET | Freq: Every day | ORAL | 0 refills | Status: AC

## 2018-02-13 NOTE — Telephone Encounter (Signed)
I spoke to pt's wife and she stated we are not in here [facility] plan, only the hospital [facility]). Dr. Bonna Gains had mentioned to her about referring her to another provider: Dr. Brayton Layman.  Pt informed that I will ask Dr. Bonna Gains about this and send if applicable. Also will go ahead and order Entyvio on the new insurance so that he can get started on this. Also wife unsure about lab work cost. She will contact [facility] to see if covered also regarding stool test.  Dr. Bonna Gains states to go ahead with referral for new provider.

## 2018-02-13 NOTE — Telephone Encounter (Signed)
Pt's wife Shirlean Mylar was called and informed that Entyvio rx was sent for approval. (through [facility]). Also have initated the referral with Dr. Drexel Iha with [facility] due to insurance change with [employer].

## 2018-02-13 NOTE — Telephone Encounter (Signed)
Khing Belcher (wife) called about patient's stool sample he has to do today. Please call.

## 2018-02-13 NOTE — Telephone Encounter (Signed)
Shirlean Mylar (wife) notified of Dr. Michele Mcalpine request to decrease prednisone to 74m daily. Also referral sent to Dr. ABrayton Layman MD and an appointment to be expedited for 2 weeks.

## 2018-02-17 ENCOUNTER — Telehealth: Payer: Self-pay | Admitting: Gastroenterology

## 2018-02-17 NOTE — Telephone Encounter (Signed)
Juliann Pulse from [facility] is calling to speak with Debbie regarding  rx entivio order is on hold and needs [facility]  cb [phone]

## 2018-02-18 NOTE — Telephone Encounter (Signed)
Sending order to [facility].

## 2018-02-20 LAB — CBC
Hematocrit: 40.5 % (ref 37.5–51.0)
Hemoglobin: 13 g/dL (ref 13.0–17.7)
MCH: 26.7 pg (ref 26.6–33.0)
MCHC: 32.1 g/dL (ref 31.5–35.7)
MCV: 83 fL (ref 79–97)
Platelets: 250 10*3/uL (ref 150–450)
RBC: 4.86 x10E6/uL (ref 4.14–5.80)
RDW: 14 % (ref 12.3–15.4)
WBC: 7.2 10*3/uL (ref 3.4–10.8)

## 2018-02-20 LAB — COMPREHENSIVE METABOLIC PANEL
ALT: 23 IU/L (ref 0–44)
AST: 13 IU/L (ref 0–40)
Albumin/Globulin Ratio: 1.7 (ref 1.2–2.2)
Albumin: 4 g/dL (ref 3.5–5.5)
Alkaline Phosphatase: 45 IU/L (ref 39–117)
BUN/Creatinine Ratio: 19 (ref 9–20)
BUN: 16 mg/dL (ref 6–24)
Bilirubin Total: 0.3 mg/dL (ref 0.0–1.2)
CALCIUM: 9.5 mg/dL (ref 8.7–10.2)
CO2: 24 mmol/L (ref 20–29)
Chloride: 102 mmol/L (ref 96–106)
Creatinine, Ser: 0.84 mg/dL (ref 0.76–1.27)
GFR calc Af Amer: 112 mL/min/{1.73_m2} (ref >59)
GFR calc non Af Amer: 97 mL/min/{1.73_m2} (ref >59)
GLUCOSE: 118 mg/dL — AB (ref 65–99)
Globulin, Total: 2.4 g/dL (ref 1.5–4.5)
Potassium: 4.8 mmol/L (ref 3.5–5.2)
Sodium: 141 mmol/L (ref 134–144)
Total Protein: 6.4 g/dL (ref 6.0–8.5)

## 2018-02-20 LAB — ADALIMUMAB LEVEL AND ANTIBODY
ANTI-ADALIMUMAB ANTIBODY: 34 ng/mL
Adalimumab Drug Level: 3.8 ug/mL

## 2018-02-20 LAB — C-REACTIVE PROTEIN: CRP: <1 mg/L (ref 0–10)

## 2018-02-20 NOTE — Progress Notes (Signed)
[name] can you please fax these new labs to Dr. Hassie Bruce office.   I talked to the patient today and he reports feeling much better. States he does not wake up at night anymore to have a BM. His BMs are more formed, without blood, and he is not afraid of going out of the house anymore. He has 3-4 formed BM a day. His Humira Ab level is now low at 34 and drug level is 3.8. Therefore, the combination of mercaptopurine and Humira is working. I have informed Dr. Brayton Layman at [facility] who pt has an appt with on Jan 16th in 6 days (due to change in insurance and need to switch providers). We will thus plan on continuing Humira and mercaptopurine. He will NOT start entyvio. I asked pt to decrease prednisone to 56m and keep on that dose till he sees Dr. JDomenica Failin 6 days and verbalized understanding repeated this back to me appropriately. He was also hoping that since he is feeling much better that we wont need to change his meds. I have asked him to call u[place]oreaimmediately if he has any change in symptoms.

## 2018-02-23 ENCOUNTER — Telehealth: Payer: Self-pay

## 2018-02-23 NOTE — Telephone Encounter (Signed)
I contacted [facility] to cancel order for [facility] as requested by Dr. Bonna Gains.

## 2018-02-26 ENCOUNTER — Ambulatory Visit: Payer: BLUE CROSS/BLUE SHIELD | Admitting: Gastroenterology

## 2018-03-03 ENCOUNTER — Encounter: Admit: 2018-03-03 | Discharge: 2018-03-04 | Payer: PRIVATE HEALTH INSURANCE

## 2018-03-03 DIAGNOSIS — K5100 Ulcerative (chronic) pancolitis without complications: Principal | ICD-10-CM

## 2018-03-03 MED ORDER — ADALIMUMAB PEN CITRATE FREE 40 MG/0.4 ML
SUBCUTANEOUS | 3 refills | 0.00000 days | Status: CP
Start: 2018-03-03 — End: 2018-10-06

## 2018-03-03 MED ORDER — MERCAPTOPURINE 50 MG TABLET
ORAL_TABLET | Freq: Every day | ORAL | 3 refills | 0.00000 days | Status: CP
Start: 2018-03-03 — End: 2018-04-02

## 2018-03-18 ENCOUNTER — Ambulatory Visit: Payer: BLUE CROSS/BLUE SHIELD | Admitting: Gastroenterology

## 2018-04-13 DIAGNOSIS — K5100 Ulcerative (chronic) pancolitis without complications: Principal | ICD-10-CM

## 2018-04-27 DIAGNOSIS — K5100 Ulcerative (chronic) pancolitis without complications: Principal | ICD-10-CM

## 2018-04-28 DIAGNOSIS — M2550 Pain in unspecified joint: Principal | ICD-10-CM

## 2018-04-28 DIAGNOSIS — R350 Frequency of micturition: Principal | ICD-10-CM

## 2018-04-28 DIAGNOSIS — K5100 Ulcerative (chronic) pancolitis without complications: Principal | ICD-10-CM

## 2018-05-11 MED ORDER — PREDNISONE 10 MG TABLET
ORAL_TABLET | Freq: Every day | ORAL | 0 refills | 0 days | Status: CP
Start: 2018-05-11 — End: 2018-10-06

## 2018-06-08 ENCOUNTER — Encounter: Admit: 2018-06-08 | Discharge: 2018-06-09 | Payer: PRIVATE HEALTH INSURANCE

## 2018-06-08 DIAGNOSIS — K5100 Ulcerative (chronic) pancolitis without complications: Principal | ICD-10-CM

## 2018-06-08 DIAGNOSIS — Z79899 Other long term (current) drug therapy: Secondary | ICD-10-CM

## 2018-06-08 DIAGNOSIS — M2550 Pain in unspecified joint: Secondary | ICD-10-CM

## 2018-06-08 MED ORDER — PREDNISONE 10 MG TABLET
ORAL_TABLET | Freq: Every day | ORAL | 0 refills | 0 days | Status: CP
Start: 2018-06-08 — End: 2018-10-06

## 2018-06-29 ENCOUNTER — Encounter: Admit: 2018-06-29 | Discharge: 2018-06-30 | Payer: PRIVATE HEALTH INSURANCE | Attending: Family | Primary: Family

## 2018-06-29 ENCOUNTER — Encounter: Admit: 2018-06-29 | Discharge: 2018-06-29 | Payer: PRIVATE HEALTH INSURANCE | Attending: Family | Primary: Family

## 2018-06-29 DIAGNOSIS — Z01818 Encounter for other preprocedural examination: Principal | ICD-10-CM

## 2018-06-29 DIAGNOSIS — Z1159 Encounter for screening for other viral diseases: Principal | ICD-10-CM

## 2018-07-01 ENCOUNTER — Encounter: Admit: 2018-07-01 | Discharge: 2018-07-01 | Payer: PRIVATE HEALTH INSURANCE

## 2018-07-01 DIAGNOSIS — K5100 Ulcerative (chronic) pancolitis without complications: Principal | ICD-10-CM

## 2018-07-02 MED ORDER — XELJANZ 10 MG TABLET
ORAL_TABLET | Freq: Two times a day (BID) | ORAL | 2 refills | 0.00000 days | Status: CP
Start: 2018-07-02 — End: 2018-09-15

## 2018-07-03 NOTE — Unmapped (Signed)
Per test claim for Kevin Tran at the [facility], patient needs Medication Assistance Program for Prior Authorization.

## 2018-07-08 NOTE — Unmapped (Signed)
Kevin Tran denied.  Must try and fail stelara (and Humira) first.  Interim care/voucher program application sent.  Will also need to initiate appeal.  Will discuss with Dr. Raphael Gibney.

## 2018-07-09 NOTE — Unmapped (Signed)
[employer] confirmation email received.

## 2018-07-10 NOTE — Unmapped (Signed)
I called patient in follow-up today.  I reviewed his pathology results over the phone.  Also discussed plan of care.  He expects to get the [place] shipped to him by next week and I told him to go ahead and start it as soon as he gets it.  He will continue prednisone taper.  He will get lab work in 2 to 3 weeks at [facility].  We will do a follow-up visit with me virtual or in person in approximately 2 months.  At that time we will assess response to [facility] and potentially tapered down to 5 mg if he is doing well.    I also reviewed recommendation for shingles vaccine.  He will contact his PCP office to get Shingrix.  I told him that if there is any difficulty I could also send a prescription to a local pharmacy.

## 2018-07-10 NOTE — Unmapped (Signed)
Confirmed with [employer] that 30 day voucher for Kevin Tran will be delivered on June 2. They are enrolling him in the interim care program for ongoing coverage while we work on appeal process.  Dosing verified of 10mg  BID.  Interim care will cover patient for up to 2 years while appeal attempts are exhausted.  Sent to Sherrilyn Rist, Charity fundraiser for ongoing follow up.     Patient will start Kevin Tran once received and get labs in 2-3weeks at [facility].  Lab orders placed by Dr. Raphael Gibney, see TC message on 07/10/2018.

## 2018-07-13 MED ORDER — SHINGRIX ADJUVANT COMPONENT (PF) INTRAMUSCULAR SUSPENSION
1 refills | 0 days | Status: CP
Start: 2018-07-13 — End: ?

## 2018-07-13 NOTE — Unmapped (Signed)
Script for shingrix sent to local pharmacy per Dr. Raphael Gibney recommendation, not available at local PCP office.

## 2018-07-14 NOTE — Unmapped (Signed)
Patient asked about Hep B vaccines, his previous MD ordered it but he needs the third.  Looks like he got Hep A/B on  12/16/2017, 11/12/2017 so is a bit late for booster.  Will discuss with Dr. Raphael Gibney.

## 2018-07-15 NOTE — Unmapped (Signed)
Called [employer] to give them update on status of appeal. Provided status that it is still in process

## 2018-07-16 NOTE — Unmapped (Signed)
Patient asked about Hep B vaccines, his previous MD ordered it but he needs the third.  Looks like he got Hep A/B on  12/16/2017, 11/12/2017 so is a bit late for booster.  Will discuss with Dr. Raphael Gibney.

## 2018-07-17 MED ORDER — HEPATITIS A AND B VIRUS VACCINE(PF)720 ELISA UNIT-20 MCG/ML IM SYRINGE
Freq: Once | INTRAMUSCULAR | 0 refills | 0 days | Status: CP
Start: 2018-07-17 — End: 2018-07-17

## 2018-07-21 NOTE — Unmapped (Signed)
Patient called to notify that the shingles vaccine at [facility] currently on hold because they are not administering non essential vaccinations at the moment due to COVID19. Called and spoke with pharmacist and explained planned immunosuppression and Vincenza Hews in agreement with proceeding with Shingles vaccine start due to start of xeljanz therapy.  Patient instructed to call to schedule a time with Vincenza Hews to begin vaccine series.

## 2018-07-21 NOTE — Unmapped (Signed)
Appeal letter faxed to [phone] - [employer] level 1 appeal today 07/21/2018  Ref number [id number]

## 2018-07-29 NOTE — Unmapped (Signed)
Called [employer] to update them on current appeal. Was submitted on 07/21/2018. Spent updating [employer] that appeal is still pending

## 2018-07-31 NOTE — Unmapped (Signed)
Spoke with [employer] -received second appeal denial from [facility] for pt. Pt is enrolled in interim care which can be up to 2 years however pt has not responded to pharmacy request to ship his first medication.   pharmacy number: [phone]   Faxed appeal denial to [phone] ([employer])    Called pt to provide [employer] phone number again as pt needs to order first shipment of Merlene Laughter states they will not call pt again, waiting for pt to call. Left VM for pt with instructions on calling pharmacy for shipment

## 2018-08-10 NOTE — Unmapped (Signed)
Called pt back - left VM for [employer] indicating he need labs done?   Pt has standing labs for xeljnaz monitoring (4week) at [facility] already. Unsure if pt has started though - left VM.

## 2018-08-12 LAB — CBC
HEMATOCRIT: 38.8 % (ref 37.5–51.0)
HEMOGLOBIN: 12.9 g/dL — ABNORMAL LOW (ref 13.0–17.7)
PLATELET COUNT: 237 10*3/uL (ref 150–450)
RED BLOOD CELL COUNT: 4.86 x10E6/uL (ref 4.14–5.80)
RED CELL DISTRIBUTION WIDTH: 13.1 % (ref 11.6–15.4)
WHITE BLOOD CELL COUNT: 7.1 10*3/uL (ref 3.4–10.8)

## 2018-08-12 LAB — COMPREHENSIVE METABOLIC PANEL
A/G RATIO: 1.6 (ref 1.2–2.2)
ALBUMIN: 4.1 g/dL (ref 3.8–4.9)
ALKALINE PHOSPHATASE: 58 IU/L (ref 39–117)
ALT (SGPT): 13 IU/L (ref 0–44)
AST (SGOT): 15 IU/L (ref 0–40)
BILIRUBIN TOTAL: 0.2 mg/dL (ref 0.0–1.2)
BLOOD UREA NITROGEN: 12 mg/dL (ref 6–24)
CALCIUM: 9.3 mg/dL (ref 8.7–10.2)
CHLORIDE: 106 mmol/L (ref 96–106)
CO2: 22 mmol/L (ref 20–29)
GFR MDRD AF AMER: 94 mL/min/{1.73_m2}
GFR MDRD NON AF AMER: 81 mL/min/{1.73_m2}
GLOBULIN, TOTAL: 2.5 g/dL (ref 1.5–4.5)
POTASSIUM: 4.4 mmol/L (ref 3.5–5.2)
SODIUM: 141 mmol/L (ref 134–144)
TOTAL PROTEIN: 6.6 g/dL (ref 6.0–8.5)

## 2018-08-12 LAB — RED BLOOD CELL COUNT: Erythrocytes:NCnc:Pt:Bld:Qn:Automated count: 4.86

## 2018-08-12 LAB — CO2: Carbon dioxide:SCnc:Pt:Ser/Plas:Qn:: 22

## 2018-08-12 NOTE — Unmapped (Signed)
First level appeal denied    Second level appeal sent to [facility] fax [phone]     [employer] updated via phone.

## 2018-08-20 NOTE — Unmapped (Signed)
Called [employer] as they sent PA prefilled form to provider office?  Second appeal is still in process and was faxed to [facility] on 08/12/2018  And [employer] was notified on 08/12/18 as well.    Spoke with [employer] to clarify- they understand pt is on second appeal

## 2018-08-24 NOTE — Unmapped (Signed)
[facility]   FOLLOW UP NOTE - INFLAMMATORY BOWEL DISEASE  08/25/2018    Demographics:  Kevin Tran is a 58 y.o. year old male    Diagnosis:  Ulcerative Colitis  Disease onset (yr):  Late 2018  Age at onset:  > 40yr old (A3)  Location:  Left-sided (E2)  Behavior:  Colitis  Current Tight Control Scenario:   Maintenance = Combo          HPI / NOTE :     PHONE VISIT DUE TO COVID19    HPI:    Interval Events:   1.  Last seen April 2020.  Since then had colonoscopy showing moderate proctitis.  We hence switched to [name] 06/2018.   2.  He got labs 08/11/2018 - CBC/CMP looked good.  No Lipid levels done.     HPI:   Has been on Xeljanz for ~6-7 weeks.  Not a lot better.  Still lot of blood, greasy.  Sometimes lot of blood, 90% of the time.    BMs 10x/day.  Rush to bathroom after eating. No nocturnal stools.  Weight has been stable.      Abdominal pain (0-10):   BM a day: 10x/day  Consistency: loose, watery  % of stools have blood: 90%  Nocturnal BM: yes  Urgency: yes++  Weight change over last 6 mo: weight stable  Smoking: no  NSAIDS: avoids    Review of Systems:   Review of systems positive for: negative except as above.   Otherwise, the balance of 10 systems is negative.           IBD HISTORY:     Year of disease onset:  2018    Brief IBD Disease Course:  In late 2018 developed bloody diarrhea and incontinence. Colonoscopy in Feb 2019 showed edema and erythema in the rectum and sigmoid with decreased vascularity in the rest of the colon; the TI appeared normal. He was treated with courses of prednisone for much of 2019 and also put on mesalamine without improvement. In Fall 2019 he was started on Humira w/o improvement. In Dec 2019 he had moderate level antibodies to humira; was added; mesalamine was stopped; put on prednisone taper; rapidly improved  - Jan 2020 - stopped mesalamine, used weekly Humira + with initial improvement.   - April 2020 - notable worsening of diarrhea and abdominal pain  Colonoscopy with moderate inflammation mainly in rectum.  Changed to [name] 06/2018.     Endoscopy:      - Colonoscopy Feb 2019: edema + erythema in rectum and sigmoid; decreased vascularity in rest of colon  - Dec 2019: moderate inflammation throughout the colon  - Colonoscopy 07/01/2018 - poor prep, moderate proctitis up to 15cm, mild patchy nodularity in remaining colon.  Normal terminal ileum.     Imaging:    - CT 01/28/18: left sided colitis; liver lesion (see below)  - MRI abdomen to evaluate liver lesion 02/09/18 - aforementioned liver lesion felt to be focal fatty infiltration;  6 month repeat recommended    Prior IBD medications (type, dose, duration, response):  [x]  5-ASAs - Mesalamine  - no improvement; possible paradoxical diarrhea  [x]  Oral corticosteroids   []  Intravenous corticosteroids  []  Antibiotics  [x]  Thiopurines: started in Dec 2019  []  Methotrexate  [x]  Anti-TNF therapies - Humira since Sept or Oct 2019. Low level and low titer Ab in Dec 2019. Increased to weekly dosing. Stopped due to non-response.   []   Anti-Integrin therapies  []  Anti-Interleukin therapies  [x]  Anti-JAK therapies - Xeljanz 10mg  started 06/2018  []  Cyclosporine  []  Clinical trial medication  []  Other (Please specify):    IBD health maintenance:  Influenza vaccine: given in Fall 2019  Pneumonia vaccine:   Hepatitis B: received twinrix 2 of 3;   TB testing: Quant gold neg in March 2019  Chickenpox/Shingles history:   Bone denistometry:   Derm appointment:  Last small bowel imaging:   Last colonoscopy: Dec 2019  PAP smear:     Extraintestinal manifestations:   -joint pains affecting: n  -eye: n  -skin: n  -oral ulcers :  n  -blood clots: n  -PSC: n  -other: n          Past Medical History:   Past medical history:   Past Medical History:   Diagnosis Date   ??? Arthritis    ??? Pneumothorax    ??? Ulcerative colitis (CMS-HCC)      Past surgical history:   Past Surgical History:   Procedure Laterality Date   ??? BACK SURGERY  1996   ??? COLONOSCOPY     ??? PR COLONOSCOPY W/BIOPSY SINGLE/MULTIPLE Left 07/01/2018    Procedure: COLONOSCOPY, FLEXIBLE, PROXIMAL TO SPLENIC FLEXURE; WITH BIOPSY, SINGLE OR MULTIPLE;  Surgeon: [name], MD;  Location: [facility];  Service: Gastroenterology     Family history:   Family History   Problem Relation Age of Onset   ??? Cancer Mother      Social history:   Social History     Socioeconomic History   ??? Marital status: Married     Spouse name: Not on file   ??? Number of children: Not on file   ??? Years of education: Not on file   ??? Highest education level: Not on file   Occupational History   ??? Not on file   Social Needs   ??? Financial resource strain: Not on file   ??? Food insecurity     Worry: Not on file     Inability: Not on file   ??? Transportation needs     Medical: Not on file     Non-medical: Not on file   Tobacco Use   ??? Smoking status: Former Smoker     Types: Cigarettes   ??? Smokeless tobacco: Never Used   Substance and Sexual Activity   ??? Alcohol use: No   ??? Drug use: No   ??? Sexual activity: Not on file   Lifestyle   ??? Physical activity     Days per week: Not on file     Minutes per session: Not on file   ??? Stress: Not on file   Relationships   ??? Social Wellsite geologist on phone: Not on file     Gets together: Not on file     Attends religious service: Not on file     Active member of club or organization: Not on file     Attends meetings of clubs or organizations: Not on file     Relationship status: Not on file   Other Topics Concern   ??? Not on file   Social History Narrative   ??? Not on file             Allergies:   No Known Allergies          Medications:     Current Outpatient Medications   Medication Sig Dispense Refill   ???  acetaminophen (TYLENOL) 500 MG tablet Take 500 mg by mouth every six (6) hours as needed.      ??? acetaminophen (TYLENOL) 500 MG tablet Take 500 mg by mouth.     ??? ADALIMUMAB PEN CITRATE FREE 40 MG/0.4 ML Inject 0.4 mL (40 mg total) under the skin every seven (7) days. 12 each 3   ??? adjuvant AS01B, PF,vial 1 of 2 (SHINGRIX ADJUVANT COMPONENT-PF) Susp Shingrix X1 now. Then repeat again in 2-53mths for booster. 0.5 mL 1   ??? budesonide (UCERIS) 2 mg/actuation Foam Insert 1 Dose into the rectum two (2) times a day for 14 days. Use twice daily for 2 weeks, then once daily for 4 weeks. 66.8 g 1   ??? gabapentin (NEURONTIN) 300 MG capsule Take 1 capsule (300 mg total) by mouth nightly. Take 2-3 tabs PO qhs for pain 90 capsule 2   ??? HYDROcodone-acetaminophen (NORCO 10-325) 10-325 mg per tablet Take 2 tablets by mouth every eight (8) hours as needed.      ??? mercaptopurine (PURINETHOL) 50 mg tablet Take 2.5 mg/kg/day by mouth daily.     ??? mesalamine (DELZICOL) 400 mg cdti Take 400 mg by mouth.     ??? multivitamin capsule Take 1 capsule by mouth daily.      ??? omeprazole (PRILOSEC) 20 MG capsule Take 20 mg by mouth daily.      ??? predniSONE (DELTASONE) 10 MG tablet Take 1 tablet (10 mg total) by mouth daily. Start at 20mg , taper as directed. 50 tablet 0   ??? predniSONE (DELTASONE) 10 MG tablet Take 1 tablet (10 mg total) by mouth daily. Start at 40mg  and taper as directed. 100 tablet 0   ??? pregabalin (LYRICA) 225 MG capsule TK ONE C PO  BID     ??? pregabalin (LYRICA) 225 MG capsule Take 225 mg by mouth.     ??? tofacitinib (XELJANZ) 10 mg Tab Take 1 tablet (10 mg) by mouth Two (2) times a day. 60 tablet 2     No current facility-administered medications for this visit.              Physical Exam:   There were no vitals taken for this visit.    PHONE VISIT - no vitals or physical exam          Labs, Data & Indices:     Lab Review:   Lab Results   Component Value Date    WBC 7.1 08/11/2018    RBC 4.86 08/11/2018    HGB 12.9 (L) 08/11/2018     Lab Results   Component Value Date    AST 15 08/11/2018    ALT 13 08/11/2018    BUN 12 08/11/2018    Creatinine 1.01 08/11/2018    CO2 22 08/11/2018    Albumin 4.1 03/03/2018    Calcium 9.3 08/11/2018     No results found for: TSH ...............................................................................................................................................Marland Kitchen  Modified Mayo Score for Ulcerative Colitis  Stool Frequency:  3 = > 5 more than normal  Rectal bleeding:   2 = Obvious blood most of time  Physicians Global Assessment:   2 =Moderate colitis  Score: 7  Remission <2  ............................................................................................................................................  ORDERS THIS VISIT:       Diagnosis ICD-10-CM Associated Orders   1. Chronic pancolonic ulcerative colitis (CMS-HCC)  K51.00 budesonide (UCERIS) 2 mg/actuation Foam   2. High risk medication use  Z79.899            Assessment & Recommendations:  Disease state:    Kevin Tran is a 58 y.o. male with ulcerative pancolitis since 2018. He was started on Humira in 2019.  He had low titer Ab, with a transient improvement on weekly Humira + .  By April 2020, he had a mechanistic non-response to  Humira (good drug level and moderate to severe proctitis).  We changed to [place] in May 2020.  He had no improvement with a course of steroids and so far there has been little improvement with [name] 10mg  BID (6+ weeks on therapy). I suspect he is having a primary nonresponse to [place].  I would like to give the medication an additional one month.  If it is still not effective, we will change to alternative medication - likely Stelara or possibly clinical trials.  I did discuss this with him today.  We also reviewed pros and cons of Infliximab and Vedolizumab and surgery which are options.      We will use Uceris foam for 1 month now to see if that provides some short term relief of his proctitis.     PLAN:  1.  Continue Xeljanz 10mg  twice daily for now  2.  Start the steroid foam (Uceris foam) twice daily for 2 weeks, then once daily for 4 weeks.  3.  Follow up with me in 1 month.  If no improvement, we will change to alternative medications, such as Stelara or possibly clinical trials.   4.  Call if worsening symptoms.   --------------------------------------------------------  Author: [name] 08/25/2018 11:15 AM    [name], MD  Assistant Professor of Medicine  Division of Gastroenterology & Hepatology  [place] [facility]  =======================================================  I spent 23 minutes on the phone with the patient. I spent an additional 12 minutes on pre- and post-visit activities.     The patient was physically located in [place] or a state in which I am permitted to provide care. The patient understood that s/he may incur co-pays and cost sharing, and agreed to the telemedicine visit. The visit was completed via phone and/or video, which was appropriate and reasonable under the circumstances given the patient's presentation at the time.    The patient has been advised of the potential risks and limitations of this mode of treatment (including, but not limited to, the absence of in-person examination) and has agreed to be treated using telemedicine. The patient's/patient's family's questions regarding telemedicine have been answered.     If the phone/video visit was completed in an ambulatory setting, the patient has also been advised to contact their provider???s office for worsening conditions, and seek emergency medical treatment and/or call 911 if the patient deems either necessary.

## 2018-08-25 ENCOUNTER — Encounter: Admit: 2018-08-25 | Discharge: 2018-08-26 | Payer: PRIVATE HEALTH INSURANCE

## 2018-08-25 DIAGNOSIS — K5100 Ulcerative (chronic) pancolitis without complications: Principal | ICD-10-CM

## 2018-08-25 DIAGNOSIS — Z79899 Other long term (current) drug therapy: Secondary | ICD-10-CM

## 2018-08-25 MED ORDER — UCERIS 2 MG/ACTUATION RECTAL FOAM
Freq: Two times a day (BID) | RECTAL | 1 refills | 0.00000 days | Status: CP
Start: 2018-08-25 — End: 2018-10-06

## 2018-08-25 NOTE — Unmapped (Addendum)
Kevin Tran it was a pleasure seeing you today.  Here is a summary/wrap up from today's visit:     1.  Continue Xeljanz 10mg  twice daily for now  2.  Start the steroid foam (Uceris foam) twice daily for 2 weeks, then once daily for 4 weeks.  3.  Follow up with me in 1 month.  If no improvement, we will change to alternative medications, such as Stelara or possibly clinical trials.   4.  Call if worsening symptoms.   5.  If any trouble or symptoms, do not hesitate to call.     Coupon for Uceris Foam: https://www.salix.com/ucerisfoam/savings#formcontent    [name], MD  Assistant Professor of Medicine  Division of Gastroenterology & Hepatology  [place] [facility] [place]    Clinical Nurse Contact:  Neta Mends, RN  Ph#  [phone]  Fax#  [phone]    For clinic appointments, please call, [phone], option 1.  For questions regarding radiology appointments, or to schedule, [phone].  For questions regarding scheduling GI procedures (e.g,. Colonoscopy), please call, [phone], option 2.    For educational material and resources:  [email or web address]  ============================================

## 2018-08-27 NOTE — Unmapped (Signed)
[employer] states pt has P[employer] of [employer] and [name] is on the formulary and should be approved - called [employer], while they are pt PBM [employer] does own [employer] - directed me to [facility] which is who we have been dealing with for a while.     [employer] indicated that no, we must continue with the appeals process.  Resubmitted appeal with Member signature for provider to continue appeal on his behalf today 08/31/18

## 2018-09-03 NOTE — Unmapped (Signed)
[employer] appeal department requests progross note from MD -    Appeal ID [id number] for Kevin Tran Dr Raphael Gibney last 2 clinic notes to [phone]

## 2018-09-15 MED ORDER — XELJANZ 10 MG TABLET
ORAL_TABLET | Freq: Two times a day (BID) | ORAL | 2 refills | 0 days | Status: CP
Start: 2018-09-15 — End: 2018-10-06

## 2018-09-15 NOTE — Unmapped (Signed)
Called [employer] for appeal update -   [name] 10mg  BID is approved now effective 8/3/20220 - 09/13/2021  Reference number: [id number]    Called pt - he states he has improved since starting the uceris foam. He is not having any more bleeding but still has abdominal pain, cramping and diarrhea usually just in the morning 5-6x. He is able to leave his house now vs prior to starting the foam he couldn't be away from a bathroom. He was using it twice a day but has decreased to once a day with no change. He will get a refill at the pharmacy.     He will continue xeljanz as discussed with Dr Raphael Gibney, but he doesn't feel it is helping. He will order another bottle from [facility] when they call and will call [employer] for the co pay card. Phone number provided.     He is c/o dizziness, was on antibiotics for fluid in ears from PCP which hasn't helped. Recommend f/u with PCP again.     Called [employer] to let them know pt now has an approval with his insurance for the medication

## 2018-10-05 NOTE — Unmapped (Signed)
[facility]   FOLLOW UP NOTE - INFLAMMATORY BOWEL DISEASE  10/06/2018    Demographics:  Kevin Tran is a 58 y.o. year old male    Diagnosis:  Ulcerative Colitis  Disease onset (yr):  Late 2018  Age at onset:  > 79yr old (A3)  Location:  Left-sided (E2)  Behavior:  Colitis  Current Tight Control Scenario:   Maintenance = Biologic/small molecule          HPI / NOTE :     PHONE VISIT DUE TO COVID19    HPI:    Interval Events:   1.  Last seen July 2020.  We decided to use Uceris foam and give the Kevin Tran an additional 1 month.  If not effective, likley change to clinical trials, or Stelara.   2.  Last labs 08/11/2018    HPI:   Feels the Uceris foam has helped a lot!  3-4 BM/day.  No more blood in stool.  Still has some urgency in the morning, but the rest of the day is good.  No more nocturnal stools.     Abdominal pain (0-10):   BM a day: 10x/day  Consistency: loose, watery  % of stools have blood: 90%  Nocturnal BM: yes  Urgency: yes++  Weight change over last 6 mo: weight stable  Smoking: no  NSAIDS: avoids    Review of Systems:   Review of systems positive for: negative except as above.   Otherwise, the balance of 10 systems is negative.           IBD HISTORY:     Year of disease onset:  2018    Brief IBD Disease Course:  In late 2018 developed bloody diarrhea and incontinence. Colonoscopy in Feb 2019 showed edema and erythema in the rectum and sigmoid with decreased vascularity in the rest of the colon; the TI appeared normal. He was treated with courses of prednisone for much of 2019 and also put on mesalamine without improvement. In Fall 2019 he was started on Humira w/o improvement. In Dec 2019 he had moderate level antibodies to humira; was added; mesalamine was stopped; put on prednisone taper; rapidly improved  - Jan 2020 - stopped mesalamine, used weekly Humira + with initial improvement.   - April 2020 - notable worsening of diarrhea and abdominal pain  Colonoscopy with moderate inflammation mainly in rectum.  Changed to Kevin Tran 06/2018.  Had limited response.  Did have good improvement with Uceris foam 09/2018.     Endoscopy:      - Colonoscopy Feb 2019: edema + erythema in rectum and sigmoid; decreased vascularity in rest of colon  - Dec 2019: moderate inflammation throughout the colon  - Colonoscopy 07/01/2018 - poor prep, moderate proctitis up to 15cm, mild patchy nodularity in remaining colon.  Normal terminal ileum.     Imaging:    - CT 01/28/18: left sided colitis; liver lesion (see below)  - MRI abdomen to evaluate liver lesion 02/09/18 - aforementioned liver lesion felt to be focal fatty infiltration;  6 month repeat recommended    Prior IBD medications (type, dose, duration, response):  [x]  5-ASAs - Mesalamine  - no improvement; possible paradoxical diarrhea  [x]  Oral corticosteroids   []  Intravenous corticosteroids  []  Antibiotics  [x]  Thiopurines: started in Dec 2019  []  Methotrexate  [x]  Anti-TNF therapies - Humira since Sept or Oct 2019. Low level and low titer Ab in Dec 2019. Increased to weekly dosing. Stopped  due to non-response.   []  Anti-Integrin therapies  []  Anti-Interleukin therapies  [x]  Anti-JAK therapies - Xeljanz 10mg  started 06/2018  []  Cyclosporine  []  Clinical trial medication  []  Other (Please specify):    IBD health maintenance:  Influenza vaccine: given in Fall 2019  Pneumonia vaccine:   Hepatitis B: received twinrix 2 of 3;   TB testing: Quant gold neg in March 2019  Chickenpox/Shingles history: Shingrix completed 09/2018  Bone denistometry:   Derm appointment:  Last small bowel imaging:   Last colonoscopy: Dec 2019  PAP smear:     Extraintestinal manifestations:   -joint pains affecting: n  -eye: n  -skin: n  -oral ulcers :  n  -blood clots: n  -PSC: n  -other: n          Past Medical History:   Past medical history:   Past Medical History:   Diagnosis Date   ??? Arthritis    ??? Pneumothorax    ??? Ulcerative colitis (CMS-HCC)      Past surgical history:   Past Surgical History:   Procedure Laterality Date   ??? BACK SURGERY  1996   ??? COLONOSCOPY     ??? PR COLONOSCOPY W/BIOPSY SINGLE/MULTIPLE Left 07/01/2018    Procedure: COLONOSCOPY, FLEXIBLE, PROXIMAL TO SPLENIC FLEXURE; WITH BIOPSY, SINGLE OR MULTIPLE;  Surgeon: [name], MD;  Location: [facility];  Service: Gastroenterology     Family history:   Family History   Problem Relation Age of Onset   ??? Cancer Mother      Social history:   Social History     Socioeconomic History   ??? Marital status: Married     Spouse name: None   ??? Number of children: None   ??? Years of education: None   ??? Highest education level: None   Occupational History   ??? None   Social Needs   ??? Financial resource strain: None   ??? Food insecurity     Worry: None     Inability: None   ??? Transportation needs     Medical: None     Non-medical: None   Tobacco Use   ??? Smoking status: Former Smoker     Types: Cigarettes   ??? Smokeless tobacco: Never Used   Substance and Sexual Activity   ??? Alcohol use: No   ??? Drug use: No   ??? Sexual activity: None   Lifestyle   ??? Physical activity     Days per week: None     Minutes per session: None   ??? Stress: None   Relationships   ??? Social Wellsite geologist on phone: None     Gets together: None     Attends religious service: None     Active member of club or organization: None     Attends meetings of clubs or organizations: None     Relationship status: None   Other Topics Concern   ??? None   Social History Narrative   ??? None             Allergies:   No Known Allergies          Medications:     Current Outpatient Medications   Medication Sig Dispense Refill   ??? acetaminophen (TYLENOL) 500 MG tablet Take 500 mg by mouth every six (6) hours as needed.      ??? budesonide (UCERIS) 2 mg/actuation Foam Insert 1 Dose into the rectum  two (2) times a day for 14 days. Use twice daily for 2 weeks, then once daily for 4 weeks. 66.8 g 1   ??? multivitamin capsule Take 1 capsule by mouth daily.      ??? omeprazole (PRILOSEC) 20 MG capsule Take 20 mg by mouth daily.      ??? pregabalin (LYRICA) 225 MG capsule TK ONE C PO  BID     ??? acetaminophen (TYLENOL) 500 MG tablet Take 500 mg by mouth.     ??? adjuvant AS01B, PF,vial 1 of 2 (SHINGRIX ADJUVANT COMPONENT-PF) Susp Shingrix X1 now. Then repeat again in 2-61mths for booster. 0.5 mL 1   ??? gabapentin (NEURONTIN) 300 MG capsule Take 1 capsule (300 mg total) by mouth nightly. Take 2-3 tabs PO qhs for pain (Patient not taking: Reported on 10/06/2018) 90 capsule 2   ??? HYDROcodone-acetaminophen (NORCO 10-325) 10-325 mg per tablet Take 2 tablets by mouth every eight (8) hours as needed.      ??? mesalamine (DELZICOL) 400 mg cdti Take 400 mg by mouth.     ??? pneumococcal conjugate, 13-valent, (PREVNAR-13) 0.5 mL vaccine Inject 0.5 mL into the muscle once for 1 dose. 0.5 mL 0   ??? pregabalin (LYRICA) 225 MG capsule Take 225 mg by mouth.     ??? tofacitinib (XELJANZ) 5 mg Tab tablet Take 1 tablet (5 mg total) by mouth Two (2) times a day. 60 tablet 5     No current facility-administered medications for this visit.              Physical Exam:   There were no vitals taken for this visit.    VIDEO VISIT - no vitals and limited physical exam    GEN: middle aged male in no apparent distress, appears comfortable on exam  HEENT: eyes normal and symmetric  NEURO:  Normal speech, face symmetric  SKIN:  No visible rash on face/neck  Psych: affect appropriate, A&O x3          Labs, Data & Indices:     Lab Review:   Lab Results   Component Value Date    WBC 7.1 08/11/2018    RBC 4.86 08/11/2018    HGB 12.9 (L) 08/11/2018     Lab Results   Component Value Date    AST 15 08/11/2018    ALT 13 08/11/2018    BUN 12 08/11/2018    Creatinine 1.01 08/11/2018    CO2 22 08/11/2018    Albumin 4.1 03/03/2018    Calcium 9.3 08/11/2018     No results found for: TSH   ...............................................................................................................................................Marland Kitchen Modified Mayo Score for Ulcerative Colitis  Stool Frequency:  1 = 1-2 more than normal  Rectal bleeding:   0 = None  Physicians Global Assessment:   1 = Mild colitis  Score: 2  Remission <2  ............................................................................................................................................  ORDERS THIS VISIT:       Diagnosis ICD-10-CM Associated Orders   1. Chronic pancolonic ulcerative colitis (CMS-HCC)  K51.00 CBC     Comprehensive Metabolic Panel     Ferritin     Iron & TIBC     Lipid panel     CBC     Comprehensive Metabolic Panel     Ferritin     Iron & TIBC     Lipid panel     budesonide (UCERIS) 2 mg/actuation Foam     tofacitinib (XELJANZ) 5 mg Tab tablet   2. High risk medication use  Z79.899 CBC  Comprehensive Metabolic Panel     Lipid panel     pneumococcal conjugate, 13-valent, (PREVNAR-13) 0.5 mL vaccine           Assessment & Recommendations:   Disease state:    Kevin Tran is a 58 y.o. male with ulcerative pancolitis since 2018. He was started on Humira in 2019.  He had low titer Ab, with a transient improvement on weekly Humira + .  By April 2020, he had a mechanistic non-response to Humira (good drug level and moderate to severe proctitis).  We changed to [place] in May 2020.  He had no improvement with a course of steroids, but did have improvement with a course of Uceris and is feeling quite well at the moment.  We will plan to update lab work and taper the Kevin Tran to 5mg  BID.  We will also continue Uceris foam for ~1 month and then stop if he is feeling well.     PLAN:  1.  Decrease Xeljanz to 5mg  twice daily  2.  Continue Uceris foam daily for at least 4 weeks.  Then stop if feeling ok.   3.  Please get updated lab work at [facility].  These should be fasting labs.   4.  Follow up with me in 3-4 months.  --------------------------------------------------------  Author: [name] 10/06/2018 1:34 PM    [name], MD  Assistant Professor of Medicine  Division of Gastroenterology & Hepatology  [place] [facility]  =======================================================  I spent 12 minutes on the real-time audio and video with the patient. I spent an additional 14 minutes on pre- and post-visit activities.     The patient was physically located in [place] or a state in which I am permitted to provide care. The patient and/or parent/guardian understood that s/he may incur co-pays and cost sharing, and agreed to the telemedicine visit. The visit was reasonable and appropriate under the circumstances given the patient's presentation at the time.    The patient and/or parent/guardian has been advised of the potential risks and limitations of this mode of treatment (including, but not limited to, the absence of in-person examination) and has agreed to be treated using telemedicine. The patient's/patient's family's questions regarding telemedicine have been answered.     If the visit was completed in an ambulatory setting, the patient and/or parent/guardian has also been advised to contact their provider???s office for worsening conditions, and seek emergency medical treatment and/or call 911 if the patient deems either necessary.

## 2018-10-06 ENCOUNTER — Encounter: Admit: 2018-10-06 | Discharge: 2018-10-07 | Payer: PRIVATE HEALTH INSURANCE

## 2018-10-06 DIAGNOSIS — Z79899 Other long term (current) drug therapy: Secondary | ICD-10-CM

## 2018-10-06 DIAGNOSIS — K5100 Ulcerative (chronic) pancolitis without complications: Principal | ICD-10-CM

## 2018-10-06 MED ORDER — XELJANZ 5 MG TABLET
ORAL_TABLET | Freq: Two times a day (BID) | ORAL | 5 refills | 30 days | Status: CP
Start: 2018-10-06 — End: 2018-10-08

## 2018-10-06 MED ORDER — PNEUMOCOCCAL 13-VAL CONJ VACCINE-DIP CRM (PF) 0.5 ML IM SYRINGE
Freq: Once | INTRAMUSCULAR | 0 refills | 1 days | Status: CP
Start: 2018-10-06 — End: 2018-10-06

## 2018-10-06 MED ORDER — UCERIS 2 MG/ACTUATION RECTAL FOAM
Freq: Two times a day (BID) | RECTAL | 1 refills | 0 days | Status: CP
Start: 2018-10-06 — End: 2018-10-20

## 2018-10-06 NOTE — Unmapped (Signed)
Kevin Tran it was a pleasure seeing you today.  Here is a summary/wrap up from today's visit:     PLAN:  1.  Decrease Xeljanz to 5mg  twice daily  2.  Continue Uceris foam daily for at least 4 weeks.  Then stop if feeling ok.   3.  Please get updated lab work at [facility].  These should be fasting labs.   4.  Follow up with me in 3-4 months.  5.  If any trouble or symptoms, do not hesitate to call.     [name], MD  Assistant Professor of Medicine  Division of Gastroenterology & Hepatology  [place] [facility] [place]    Clinical Nurse Contact:  Kevin Mends, RN  Ph#  [phone]  Fax#  [phone]    For clinic appointments, please call, [phone], option 1.  For questions regarding radiology appointments, or to schedule, [phone].  For questions regarding scheduling GI procedures (e.g,. Colonoscopy), please call, [phone], option 2.    For educational material and resources:  [email or web address]  ============================================

## 2018-10-08 MED ORDER — XELJANZ 5 MG TABLET
ORAL_TABLET | Freq: Two times a day (BID) | ORAL | 2 refills | 30.00000 days | Status: CP
Start: 2018-10-08 — End: ?

## 2018-10-08 NOTE — Unmapped (Signed)
Pt wife called stating xeljanz prescription for 5mg  bid needs to go to [facility].   Sent per protocol

## 2018-10-09 LAB — IRON SATURATION: Iron saturation:MFr:Pt:Ser/Plas:Qn:: 35

## 2018-10-09 LAB — LIPID PANEL
CHOLESTEROL, TOTAL: 199 mg/dL (ref 100–199)
HDL CHOLESTEROL: 48 mg/dL
LDL CHOLESTEROL CALCULATED: 135 mg/dL — ABNORMAL HIGH (ref 0–99)
LDL/HDL RATIO: 2.8 ratio (ref 0.0–3.6)
VLDL CHOLESTEROL CAL: 16 mg/dL (ref 5–40)

## 2018-10-09 LAB — COMPREHENSIVE METABOLIC PANEL
A/G RATIO: 2 (ref 1.2–2.2)
ALBUMIN: 4.4 g/dL (ref 3.8–4.9)
ALKALINE PHOSPHATASE: 46 IU/L (ref 39–117)
ALT (SGPT): 14 IU/L (ref 0–44)
AST (SGOT): 15 IU/L (ref 0–40)
BLOOD UREA NITROGEN: 14 mg/dL (ref 6–24)
BUN / CREAT RATIO: 15 (ref 9–20)
CALCIUM: 9.7 mg/dL (ref 8.7–10.2)
CHLORIDE: 104 mmol/L (ref 96–106)
CREATININE: 0.93 mg/dL (ref 0.76–1.27)
GFR MDRD AF AMER: 104 mL/min/{1.73_m2}
GFR MDRD NON AF AMER: 90 mL/min/{1.73_m2}
GLOBULIN, TOTAL: 2.2 g/dL (ref 1.5–4.5)
GLUCOSE: 89 mg/dL (ref 65–99)
POTASSIUM: 4.4 mmol/L (ref 3.5–5.2)
SODIUM: 142 mmol/L (ref 134–144)

## 2018-10-09 LAB — FERRITIN: Ferritin:MCnc:Pt:Ser/Plas:Qn:: 82

## 2018-10-09 LAB — MEAN CORPUSCULAR HEMOGLOBIN: Erythrocyte mean corpuscular hemoglobin:EntMass:Pt:RBC:Qn:Automated count: 27.1

## 2018-10-09 LAB — CBC
HEMOGLOBIN: 13.1 g/dL (ref 13.0–17.7)
MEAN CORPUSCULAR HEMOGLOBIN CONC: 32.3 g/dL (ref 31.5–35.7)
MEAN CORPUSCULAR VOLUME: 84 fL (ref 79–97)
RED BLOOD CELL COUNT: 4.84 x10E6/uL (ref 4.14–5.80)
RED CELL DISTRIBUTION WIDTH: 13.3 % (ref 11.6–15.4)
WHITE BLOOD CELL COUNT: 5 10*3/uL (ref 3.4–10.8)

## 2018-10-09 LAB — IRON & TIBC
IRON SATURATION: 35 % (ref 15–55)
UNSATURATED IRON BINDING CAPACITY: 192 ug/dL (ref 111–343)

## 2018-10-09 LAB — HDL CHOLESTEROL: Cholesterol.in HDL:MCnc:Pt:Ser/Plas:Qn:: 48

## 2018-10-09 LAB — GLOBULIN, TOTAL: Globulin:MCnc:Pt:Ser:Qn:Calculated: 2.2

## 2018-10-15 MED ORDER — PNEUMOCOCCAL 13-VAL CONJ VACCINE-DIP CRM (PF) 0.5 ML IM SYRINGE
Freq: Once | INTRAMUSCULAR | 0 refills | 1 days | Status: CP
Start: 2018-10-15 — End: 2018-10-15

## 2018-10-15 NOTE — Unmapped (Signed)
Pt called requesting prevnar-13 be sent to [facility] in [place]. Dr Raphael Gibney sent this on 8/25 but the pharmacist informed pt wife he needed a new presciption.     Dr Raphael Gibney resent today 10/15/18 - RN [facility] to confirm receipt of [facility]. [facility] rep confirmed the prescription, pt does not need appointment but can come to the pharmacy at any time. Called pt to discuss, he did not answer, left vm with this information

## 2018-10-26 NOTE — Unmapped (Signed)
Received call from pt wife Zella Ball asking if pt needs prevnar [facility] by Dr Raphael Gibney as he had pneumonia vaccine last year with primary doctor   Unclear which one, if had PPSV23 or PCV13.     Called for clarification - he didn't answer, left vm

## 2018-11-26 NOTE — Unmapped (Signed)
Received notificaiton from [facility] needing additional ICD 10 codes for pt lipid panel (E78.5)

## 2018-12-18 DIAGNOSIS — K5100 Ulcerative (chronic) pancolitis without complications: Principal | ICD-10-CM

## 2018-12-18 MED ORDER — XELJANZ 5 MG TABLET
ORAL_TABLET | 0 refills | 0 days | Status: CP
Start: 2018-12-18 — End: ?

## 2018-12-18 NOTE — Unmapped (Signed)
Lab request form sent to pt via letters tab. Due for labs now for xeljanz.   Called pt, left vm. Sent [employer].

## 2018-12-21 DIAGNOSIS — Z79899 Other long term (current) drug therapy: Principal | ICD-10-CM

## 2018-12-21 NOTE — Unmapped (Signed)
Labs ordered at [facility] for pt via epic and lab request form sent to pt via mychart

## 2018-12-22 DIAGNOSIS — K5100 Ulcerative (chronic) pancolitis without complications: Principal | ICD-10-CM

## 2018-12-22 MED ORDER — XELJANZ 5 MG TABLET
ORAL_TABLET | Freq: Two times a day (BID) | ORAL | 1 refills | 30 days | Status: CP
Start: 2018-12-22 — End: ?

## 2018-12-22 NOTE — Unmapped (Signed)
Labs collected on 12/21/2018 at [facility]. Sent to Dr Raphael Gibney for review and uploaded to media tab on 12/22/2018    xeljanz refill provided

## 2018-12-28 NOTE — Unmapped (Signed)
[facility]   FOLLOW UP NOTE - INFLAMMATORY BOWEL DISEASE  12/29/2018    Demographics:  Kevin Tran is a 58 y.o. year old male    Diagnosis:  Ulcerative Colitis  Disease onset (yr):  2018  Age at onset:  > 25yr old (A3)  Location:  Left-sided (E2), mostly proctitis  Behavior:  Colitis  Current Tight Control Scenario:   Maintenance = Biologic/small molecule          HPI / NOTE :     VIDEO VISIT DUE TO COVID19    HPI:    Interval Events:   1.  Last seen 09/2018.  He was feeling well so we decided to taper xeljanz to 5mg  BID and continue Uceris foam once daily for 4 weeks.   2.  I reviewed labs from 12/21/2018 ([facility]) - CBC and LFTs ok.  Hgb 13.4, MCV 81.     HPI:   Feeling pretty good.  Rare diarrhea.  Occasional blood in stool which he thinks are related to hemorrhoids.   Has new pain in the knees and hips.   Main symptom now is fatigue which is very bothersome.     Abdominal pain (0-10):   BM a day: 4-5x/day (2x/day)  Consistency: formed  % of stools have blood: 0  Nocturnal BM: no  Urgency: minimal  Weight change over last 6 mo: weight stable  Smoking: no  NSAIDS: avoids    Review of Systems:   Review of systems positive for: negative except as above.   Otherwise, the balance of 10 systems is negative.           IBD HISTORY:     Year of disease onset:  2018    Brief IBD Disease Course:  In late 2018 developed bloody diarrhea and incontinence. Colonoscopy in Feb 2019 showed edema and erythema in the rectum and sigmoid with decreased vascularity in the rest of the colon; the TI appeared normal. He was treated with courses of prednisone for much of 2019 and also put on mesalamine without improvement. In Fall 2019 he was started on Humira w/o improvement. In Dec 2019 he had moderate level antibodies to humira; was added; mesalamine was stopped; put on prednisone taper; rapidly improved  - Jan 2020 - stopped mesalamine, used weekly Humira + with initial improvement.   - April 2020 - notable worsening of diarrhea and abdominal pain  Colonoscopy with moderate inflammation mainly in rectum.  Changed to [name] 06/2018.  Had limited response initially, but improved with Uceris foam 09/2018.    - 09/2018 - taper to xeljanz 5mg  BID    Endoscopy:      - Colonoscopy Feb 2019: edema + erythema in rectum and sigmoid; decreased vascularity in rest of colon  - Dec 2019: moderate inflammation throughout the colon  - Colonoscopy 07/01/2018 - poor prep, moderate proctitis up to 15cm, mild patchy nodularity in remaining colon.  Normal terminal ileum.     Imaging:    - CT 01/28/18: left sided colitis; liver lesion (see below)  - MRI abdomen to evaluate liver lesion 02/09/18 - aforementioned liver lesion felt to be focal fatty infiltration;  6 month repeat recommended    Prior IBD medications (type, dose, duration, response):  [x]  5-ASAs - Mesalamine  - no improvement; possible paradoxical diarrhea  [x]  Oral corticosteroids - prednisone.  Good response with budesonide foam.   []  Intravenous corticosteroids  []  Antibiotics  [x]  Thiopurines: started in Dec 2019  []   Methotrexate  [x]  Anti-TNF therapies - Humira since Sept or Oct 2019. Low level and low titer Ab in Dec 2019. Increased to weekly dosing. Stopped spring 2020 due to non-response.   []  Anti-Integrin therapies  []  Anti-Interleukin therapies  [x]  Anti-JAK therapies - Xeljanz 10mg  started 06/2018, tapered to 5mg  bid 09/2018  []  Cyclosporine  []  Clinical trial medication  []  Other (Please specify):    IBD health maintenance:  Influenza vaccine: given in Fall 2019  Pneumonia vaccine:   Hepatitis B: received twinrix 2 of 3;   TB testing: Quant gold neg in March 2019  Chickenpox/Shingles history: Shingrix completed 09/2018  Bone denistometry:   Derm appointment:  Last small bowel imaging:   Last colonoscopy: Dec 2019  PAP smear:     Extraintestinal manifestations:   -joint pains affecting: n  -eye: n  -skin: n  -oral ulcers :  n  -blood clots: n  -PSC: n -other: n          Past Medical History:   Past medical history:   Past Medical History:   Diagnosis Date   ??? Arthritis    ??? Pneumothorax    ??? Ulcerative colitis (CMS-HCC)      Past surgical history:   Past Surgical History:   Procedure Laterality Date   ??? BACK SURGERY  1996   ??? COLONOSCOPY     ??? PR COLONOSCOPY W/BIOPSY SINGLE/MULTIPLE Left 07/01/2018    Procedure: COLONOSCOPY, FLEXIBLE, PROXIMAL TO SPLENIC FLEXURE; WITH BIOPSY, SINGLE OR MULTIPLE;  Surgeon: [name], MD;  Location: [facility];  Service: Gastroenterology     Family history:   Family History   Problem Relation Age of Onset   ??? Cancer Mother      Social history:   Social History     Socioeconomic History   ??? Marital status: Married     Spouse name: Not on file   ??? Number of children: Not on file   ??? Years of education: Not on file   ??? Highest education level: Not on file   Occupational History   ??? Not on file   Social Needs   ??? Financial resource strain: Not on file   ??? Food insecurity     Worry: Not on file     Inability: Not on file   ??? Transportation needs     Medical: Not on file     Non-medical: Not on file   Tobacco Use   ??? Smoking status: Former Smoker     Types: Cigarettes   ??? Smokeless tobacco: Never Used   Substance and Sexual Activity   ??? Alcohol use: No   ??? Drug use: No   ??? Sexual activity: Not on file   Lifestyle   ??? Physical activity     Days per week: Not on file     Minutes per session: Not on file   ??? Stress: Not on file   Relationships   ??? Social Wellsite geologist on phone: Not on file     Gets together: Not on file     Attends religious service: Not on file     Active member of club or organization: Not on file     Attends meetings of clubs or organizations: Not on file     Relationship status: Not on file   Other Topics Concern   ??? Not on file   Social History Narrative   ??? Not on file  Allergies:   No Known Allergies          Medications:     Current Outpatient Medications   Medication Sig Dispense Refill   ??? acetaminophen (TYLENOL) 500 MG tablet Take 500 mg by mouth every six (6) hours as needed.      ??? acetaminophen (TYLENOL) 500 MG tablet Take 500 mg by mouth.     ??? adjuvant AS01B, PF,vial 1 of 2 (SHINGRIX ADJUVANT COMPONENT-PF) Susp Shingrix X1 now. Then repeat again in 2-39mths for booster. 0.5 mL 1   ??? budesonide 2 mg/actuation Foam Insert 1 Dose into the rectum two (2) times a day. 1 metered dose rectally twice daily x 14 days, then once daily x 28 days. 66.8 g 1   ??? gabapentin (NEURONTIN) 300 MG capsule Take 1 capsule (300 mg total) by mouth nightly. Take 2-3 tabs PO qhs for pain (Patient not taking: Reported on 10/06/2018) 90 capsule 2   ??? HYDROcodone-acetaminophen (NORCO 10-325) 10-325 mg per tablet Take 2 tablets by mouth every eight (8) hours as needed.      ??? mesalamine (DELZICOL) 400 mg cdti Take 400 mg by mouth.     ??? multivitamin capsule Take 1 capsule by mouth daily.      ??? omeprazole (PRILOSEC) 20 MG capsule Take 20 mg by mouth daily.      ??? pregabalin (LYRICA) 225 MG capsule TK ONE C PO  BID     ??? pregabalin (LYRICA) 225 MG capsule Take 225 mg by mouth.     ??? tofacitinib (XELJANZ) 5 mg Tab tablet Take 1 tablet (5 mg total) by mouth Two (2) times a day. 60 tablet 1   ??? XELJANZ 5 mg Tab tablet TAKE ONE TABLET BY MOUTH TWICE DAILY. MAY BE TAKEN WITH OR WITHOUT FOOD. STORE AT ROOM TEMPERATURE. 60 tablet 0     No current facility-administered medications for this visit.              Physical Exam:   There were no vitals taken for this visit.    VIDEO VISIT - no vitals and limited physical exam    GEN: middle aged male in no apparent distress, appears comfortable on exam, appears older than stated age  HEENT: eyes normal and symmetric  NEURO:  Normal speech, face symmetric  PULM:  Respirations comfortable  SKIN:  No visible rash on face/neck  Psych: affect appropriate, A&O x3          Labs, Data & Indices:     Lab Review:   Lab Results   Component Value Date    WBC 5.0 10/08/2018    RBC 4.84 10/08/2018    HGB 13.1 10/08/2018     Lab Results   Component Value Date    AST 15 10/08/2018    ALT 14 10/08/2018    BUN 14 10/08/2018    Creatinine 0.93 10/08/2018    CO2 26 10/08/2018    Albumin 4.1 03/03/2018    Calcium 9.7 10/08/2018     No results found for: TSH   ...............................................................................................................................................Marland Kitchen  Modified Mayo Score for Ulcerative Colitis  Stool Frequency:  1 = 1-2 more than normal  Rectal bleeding:   0 = None  Physicians Global Assessment:   1 = Mild colitis  Score: 2  Remission <2  ............................................................................................................................................  ORDERS THIS VISIT:       Diagnosis ICD-10-CM Associated Orders   1. 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 Medication management  Z79.899 CBC     Comprehensive Metabolic Panel   3. High risk medication use  Z79.899    4. Arthralgia, unspecified joint  M25.50 C-reactive protein     Sedimentation Rate   5. Other fatigue  R53.83 Vitamin B12 Level     Vitamin D 25 Hydroxy (25OH D2 + D3)     TSH           Assessment & Recommendations:   Disease state:    Kevin Tran is a 58 y.o. male with ulcerative pancolitis since 2018. He was started on Humira in 2019.  He had low titer Ab, with a transient improvement on weekly Humira + .  By April 2020, he had a mechanistic non-response to Humira (good drug level and moderate to severe proctitis).  We changed to [place] in May 2020.  He had no improvement with a course of steroids, but did have improvement with a course of Uceris foam.  We have decreased to [place] 5mg  BID - he feels relatively well but still has ~4 soft stools per day with some joint pains and considerable fatigue.  He would like to stay on [name] for now.  We will check labs including TSH, B12, iron.  If he has ongoing symptoms at the next visit, we can consider a sigmoidoscopy and changing to alternative medications such as Stelara or Remicade.     PLAN:  1.  Continue Xeljanz 5mg  twice daily  2.  Restart Uceris foam twice daily for 2 weeks, then once daily for 4 weeks.   3.  Updatelab work at [employer] when convenient.  We will check thyroid function, B12 level, vitamin D and other labs.   4.  Increase iron tablets to twice daily  5.  Follow up with me in 3-4 months.  6.  If still not feeling well at that time, we can consider changing to other medications.   --------------------------------------------------------  Author: [name] 12/29/2018 3:38 PM    [name], MD  Assistant Professor of Medicine  Division of Gastroenterology & Hepatology  [place] [facility]  =======================================================  I spent 18 minutes on the real time audio and video visit with the patient. I spent an additional 14 minutes on pre- and post-visit activities.     The patient was not located and I was located within 250 yards of a hospital based location during the audio and video visit. The patient was physically located in [place] or a state in which I am permitted to provide care. The patient and/or parent/guardian understood that s/he may incur co-pays and cost sharing, and agreed to the telemedicine visit. The visit was reasonable and appropriate under the circumstances given the patient's presentation at the time.    The patient and/or parent/guardian has been advised of the potential risks and limitations of this mode of treatment (including, but not limited to, the absence of in-person examination) and has agreed to be treated using telemedicine. The patient's/patient's family's questions regarding telemedicine have been answered.    If the visit was completed in an ambulatory setting, the patient and/or parent/guardian has also been advised to contact their provider???s  office for worsening conditions, and seek emergency medical treatment and/or call 911 if the patient deems either necessary.

## 2018-12-29 ENCOUNTER — Encounter: Admit: 2018-12-29 | Discharge: 2018-12-30 | Payer: PRIVATE HEALTH INSURANCE

## 2018-12-29 MED ORDER — BUDESONIDE 2 MG/ACTUATION RECTAL FOAM
Freq: Two times a day (BID) | RECTAL | 1 refills | 0 days | Status: CP
Start: 2018-12-29 — End: ?

## 2018-12-29 NOTE — Unmapped (Signed)
Kevin Tran it was a pleasure seeing you today.  Here is a summary/wrap up from today's visit:     1.  Continue Xeljanz 5mg  twice daily  2.  Restart Uceris foam twice daily for 2 weeks, then once daily for 4 weeks.   3.  Updatelab work at [employer] when convenient.  We will check thyroid function, B12 level, vitamin D and other labs.   4.  Increase iron tablets to twice daily  5.  Follow up with me in 3-4 months.  6.  If still not feeling well at that time, we can consider changing to other medications.     [name], MD  Assistant Professor of Medicine  Division of Gastroenterology & Hepatology  [facility]    Clinical Nurse Contact:  Neta Mends, RN  Ph#  [phone]  Fax#  [phone]    For clinic appointments, please call, [phone], option 1.  For questions regarding radiology appointments, or to schedule, [phone].  For questions regarding scheduling GI procedures (e.g,. Colonoscopy), please call, [phone], option 2.    For educational material and resources:  [email or web address]  ============================================

## 2019-01-14 NOTE — Unmapped (Addendum)
Pt called requesting lab results be sent to his mychart however there are no results from his 12/29/2018 appointment.    Called pt back to discuss. He didn't answer left vm. Sent mychart message as well    Zella Ball, pt wife clarified they request labs completed on 12/21/18 be sent to mychart. Sent labs.

## 2019-02-11 DIAGNOSIS — K5100 Ulcerative (chronic) pancolitis without complications: Principal | ICD-10-CM

## 2019-02-11 MED ORDER — XELJANZ 5 MG TABLET
ORAL_TABLET | 1 refills | 0 days | Status: CP
Start: 2019-02-11 — End: ?

## 2019-02-11 NOTE — Unmapped (Signed)
xeljanz safety monitoring labs up to date as of 11/10 (available in media tab). Clinic visit up to date as of 12/29/2018  Refill authorized at this time

## 2019-03-15 DIAGNOSIS — Z79899 Other long term (current) drug therapy: Principal | ICD-10-CM

## 2019-03-25 NOTE — Unmapped (Signed)
Spoke with pt wife Zella Ball today. She wasn't sure if Kevin Tran was due for labs for xeljanz monitoring, informed her he is due and Dr. Raphael Gibney had ordered additional tests to be done as well. Lab request form sent to pt to ensure all tests are done at [facility] when he goes tomorrow

## 2019-04-08 NOTE — Unmapped (Signed)
Pt called requesting results from labs he had done a week ago.   Do not see any results, called [facility] for lab results.   [facility] will fax over labs from 04/02/19 today      Spoke with Zella Ball, pt wife today to let her know we did receive the labs.

## 2019-05-02 MED ORDER — FLUTICASONE PROPIONATE 50 MCG/ACTUATION NASAL SPRAY,SUSPENSION
0 days
Start: 2019-05-02 — End: ?

## 2019-05-05 MED ORDER — BUDESONIDE 2 MG/ACTUATION RECTAL FOAM
Freq: Two times a day (BID) | RECTAL | 1 refills | 0 days | Status: CP
Start: 2019-05-05 — End: ?

## 2019-05-05 NOTE — Unmapped (Signed)
Spoke with pt wife today, she states starting 3 weeks ago pt starting having more diarrhea with blood, no nocturnal bms.    Called pt, Kevin Tran for more details. Pt agrees with above assessment, states he sees blood with every stool now, every time he eats or sits down he has to have BM. States it is brown liquidy with blood. He has abdominal pain, especially lower left quadrant. Feels like after he starts a new medication, it only helps for a few months and then his symptoms returns. He does feel like the uceris foam helped and would like to restart even though I don't like taking it.    Discussed above symptoms with Dr Raphael Gibney, pt also now has app with Dr Raphael Gibney on 06/08/19.

## 2019-05-20 DIAGNOSIS — K5100 Ulcerative (chronic) pancolitis without complications: Principal | ICD-10-CM

## 2019-05-20 MED ORDER — XELJANZ 5 MG TABLET
ORAL_TABLET | 0 refills | 0 days | Status: CP
Start: 2019-05-20 — End: ?

## 2019-05-20 NOTE — Unmapped (Signed)
Labs reviewd from 04/02/19. Refill on xeljanz sent for 30 days, needs labs mid May or at app with Dr Raphael Gibney on 06/08/19

## 2019-06-04 NOTE — Unmapped (Unsigned)
[facility]   FOLLOW UP NOTE - INFLAMMATORY BOWEL DISEASE  06/04/2019    Demographics:  Kevin Tran is a 59 y.o. year old male    Diagnosis:  Ulcerative Colitis  Disease onset (yr):  2018  Age at onset:  > 60yr old (A3)  Location:  Left-sided (E2), mostly proctitis  Behavior:  Colitis  Current Tight Control Scenario:   Maintenance = Biologic/small molecule          HPI / NOTE :     VIDEO VISIT DUE TO COVID19    HPI:    Interval Events:   1.  Last seen Nov 2020 - continued low dose Xeljanz, added Uceris foam x 6 weeks   2.  He contacted [name] late March, recurrent symptoms including rectal bleeding.  We restarted Uceris foam temporarily and plans for follow up with me in the clinic.     HPI:   ***    (change meds - Vedo, Stelara, Remicade).     Abdominal pain (0-10):   BM a day: 4-5x/day (2x/day)  Consistency: formed  % of stools have blood: 0  Nocturnal BM: no  Urgency: minimal  Weight change over last 6 mo: weight stable  Smoking: no  NSAIDS: avoids    Review of Systems:   Review of systems positive for: negative except as above.   Otherwise, the balance of 10 systems is negative.           IBD HISTORY:     Year of disease onset:  2018    Brief IBD Disease Course:  In late 2018 developed bloody diarrhea and incontinence. Colonoscopy in Feb 2019 showed edema and erythema in the rectum and sigmoid with decreased vascularity in the rest of the colon; the TI appeared normal. He was treated with courses of prednisone for much of 2019 and also put on mesalamine without improvement. In Fall 2019 he was started on Humira w/o improvement. In Dec 2019 he had moderate level antibodies to humira; was added; mesalamine was stopped; put on prednisone taper; rapidly improved  - Jan 2020 - stopped mesalamine, used weekly Humira + with initial improvement.   - April 2020 - notable worsening of diarrhea and abdominal pain  Colonoscopy with moderate inflammation mainly in rectum.  Changed to [name] 06/2018.  Had limited response initially, but improved with Uceris foam 09/2018.    - 09/2018 - taper to xeljanz 5mg  BID.  Nov 2020 - ongoing proctitis symptoms, restarted Uceris foam.     Endoscopy:      - Colonoscopy Feb 2019: edema + erythema in rectum and sigmoid; decreased vascularity in rest of colon  - Dec 2019: moderate inflammation throughout the colon  - Colonoscopy 07/01/2018 - poor prep, moderate proctitis up to 15cm, mild patchy nodularity in remaining colon.  Normal terminal ileum.     Imaging:    - CT 01/28/18: left sided colitis; liver lesion (see below)  - MRI abdomen to evaluate liver lesion 02/09/18 - aforementioned liver lesion felt to be focal fatty infiltration;  6 month repeat recommended    Prior IBD medications (type, dose, duration, response):  [x]  5-ASAs - Mesalamine  - no improvement; possible paradoxical diarrhea  [x]  Oral corticosteroids - prednisone.  Good response with budesonide foam.   []  Intravenous corticosteroids  []  Antibiotics  [x]  Thiopurines: started in Dec 2019  []  Methotrexate  [x]  Anti-TNF therapies - Humira since Sept or Oct 2019. Low level and low titer  Ab in Dec 2019. Increased to weekly dosing. Stopped spring 2020 due to non-response.   []  Anti-Integrin therapies  []  Anti-Interleukin therapies  [x]  Anti-JAK therapies - Xeljanz 10mg  started 06/2018, tapered to 5mg  bid 09/2018  []  Cyclosporine  []  Clinical trial medication  []  Other (Please specify):    IBD health maintenance:  Influenza vaccine: given in Fall 2019  Pneumonia vaccine:   Hepatitis B: received twinrix 2 of 3;   TB testing: Quant gold neg in March 2019  Chickenpox/Shingles history: Shingrix completed 09/2018  Bone denistometry:   Derm appointment:  Last small bowel imaging:   Last colonoscopy: Dec 2019  PAP smear:     Extraintestinal manifestations:   -joint pains affecting: n  -eye: n  -skin: n  -oral ulcers :  n  -blood clots: n  -PSC: n  -other: n          Past Medical History:   Past medical history:   Past Medical History:   Diagnosis Date   ??? Arthritis    ??? Pneumothorax    ??? Ulcerative colitis (CMS-HCC)      Past surgical history:   Past Surgical History:   Procedure Laterality Date   ??? BACK SURGERY  1996   ??? COLONOSCOPY     ??? PR COLONOSCOPY W/BIOPSY SINGLE/MULTIPLE Left 07/01/2018    Procedure: COLONOSCOPY, FLEXIBLE, PROXIMAL TO SPLENIC FLEXURE; WITH BIOPSY, SINGLE OR MULTIPLE;  Surgeon: [name], MD;  Location: [facility];  Service: Gastroenterology     Family history:   Family History   Problem Relation Age of Onset   ??? Cancer Mother      Social history:   Social History     Socioeconomic History   ??? Marital status: Married     Spouse name: Not on file   ??? Number of children: Not on file   ??? Years of education: Not on file   ??? Highest education level: Not on file   Occupational History   ??? Not on file   Tobacco Use   ??? Smoking status: Former Smoker     Types: Cigarettes   ??? Smokeless tobacco: Never Used   Substance and Sexual Activity   ??? Alcohol use: No   ??? Drug use: No   ??? Sexual activity: Not on file   Other Topics Concern   ??? Not on file   Social History Narrative   ??? Not on file     Social Determinants of Health     Financial Resource Strain:    ??? Difficulty of Paying Living Expenses:    Food Insecurity:    ??? Worried About Programme researcher, broadcasting/film/video in the Last Year:    ??? Barista in the Last Year:    Transportation Needs:    ??? Freight forwarder (Medical):    ??? Lack of Transportation (Non-Medical):    Physical Activity:    ??? Days of Exercise per Week:    ??? Minutes of Exercise per Session:    Stress:    ??? Feeling of Stress :    Social Connections:    ??? Frequency of Communication with Friends and Family:    ??? Frequency of Social Gatherings with Friends and Family:    ??? Attends Religious Services:    ??? Database administrator or Organizations:    ??? Attends Banker Meetings:    ??? Marital Status:  Allergies:   No Known Allergies          Medications: Current Outpatient Medications   Medication Sig Dispense Refill   ??? acetaminophen (TYLENOL) 500 MG tablet Take 500 mg by mouth every six (6) hours as needed.      ??? acetaminophen (TYLENOL) 500 MG tablet Take 500 mg by mouth.     ??? adjuvant AS01B, PF,vial 1 of 2 (SHINGRIX ADJUVANT COMPONENT-PF) Susp Shingrix X1 now. Then repeat again in 2-61mths for booster. 0.5 mL 1   ??? budesonide 2 mg/actuation Foam Insert 1 Dose into the rectum two (2) times a day. 1 metered dose rectally twice daily x 14 days, then once daily x 28 days. 66.8 g 1   ??? gabapentin (NEURONTIN) 300 MG capsule Take 1 capsule (300 mg total) by mouth nightly. Take 2-3 tabs PO qhs for pain (Patient not taking: Reported on 10/06/2018) 90 capsule 2   ??? HYDROcodone-acetaminophen (NORCO 10-325) 10-325 mg per tablet Take 2 tablets by mouth every eight (8) hours as needed.      ??? mesalamine (DELZICOL) 400 mg cdti Take 400 mg by mouth.     ??? multivitamin capsule Take 1 capsule by mouth daily.      ??? omeprazole (PRILOSEC) 20 MG capsule Take 20 mg by mouth daily.      ??? pregabalin (LYRICA) 225 MG capsule TK ONE C PO  BID     ??? pregabalin (LYRICA) 225 MG capsule Take 225 mg by mouth.     ??? XELJANZ 5 mg Tab tablet TAKE ONE TABLET BY MOUTH TWICE DAILY. MAY BE TAKEN WITH OR WITHOUT FOOD. STORE AT ROOM TEMPERATURE. 60 tablet 0   ??? XELJANZ 5 mg Tab tablet TAKE ONE TABLET BY MOUTH TWICE DAILY. MAY BE TAKEN WITH OR WITHOUT FOOD. STORE AT ROOM TEMPERATURE. 60 tablet 0     No current facility-administered medications for this visit.             Physical Exam:   There were no vitals taken for this visit.    VIDEO VISIT - no vitals and limited physical exam    GEN: *** in no apparent distress, appears comfortable on exam  HEENT: eyes normal and symmetric  NEURO:  Normal speech, face symmetric  PULM:  Respirations comfortable  SKIN:  No visible rash on face/neck  Psych: affect appropriate, A&O x3          Labs, Data & Indices:     Lab Review:   Lab Results   Component Value Date    WBC 5.0 10/08/2018    RBC 4.84 10/08/2018    HGB 13.1 10/08/2018     Lab Results   Component Value Date    AST 15 10/08/2018    ALT 14 10/08/2018    BUN 14 10/08/2018    Creatinine 0.93 10/08/2018    CO2 26 10/08/2018    Albumin 4.1 03/03/2018    Calcium 9.7 10/08/2018     No results found for: TSH   ...............................................................................................................................................Marland Kitchen  Modified Mayo Score for Ulcerative Colitis  Stool Frequency:  1 = 1-2 more than normal  Rectal bleeding:   0 = None  Physicians Global Assessment:   1 = Mild colitis  Score: 2  Remission <2  ............................................................................................................................................  ORDERS THIS VISIT:      {No diagnosis found. (Refresh or delete this SmartLink)}        Assessment & Recommendations:   Disease state:    ASUNCION SHIBATA is  a 59 y.o. male with ulcerative pancolitis since 2018. He was started on Humira in 2019, low titer antibodies and transient improvement on weekly Humira + .  By April 2020, he had a mechanistic non-response to Humira (good drug level and moderate to severe proctitis).  We changed to [name] in May 2020 with only partial response.  He is on Xeljanz 5mg  BID + repeat trial of UCeris foam. **      *** If he has ongoing symptoms at the next visit, we can consider a sigmoidoscopy and changing to alternative medications such as Stelara or Remicade.     PLAN: ***  1.  Continue Xeljanz 5mg  twice daily  2.  Restart Uceris foam twice daily for 2 weeks, then once daily for 4 weeks.   3.  [employer] work at [employer] when convenient.  We will check thyroid function, B12 level, vitamin D and other labs.   4.  Increase iron tablets to twice daily  5.  Follow up with me in 3-4 months.  6.  If still not feeling well at that time, we can consider changing to other medications. --------------------------------------------------------  Author: [name] 06/04/2019 9:07 AM    [name], MD  Assistant Professor of Medicine  Division of Gastroenterology & Hepatology  [facility]  =======================================================

## 2019-06-07 DIAGNOSIS — Z79899 Other long term (current) drug therapy: Principal | ICD-10-CM

## 2019-06-07 NOTE — Unmapped (Signed)
Pt has app with Dr Raphael Gibney tomorrow 4/27 however Kevin Tran, pt wife, called RN and states pt doesn't want an in person visit due to pandemic and her mother passed away and pt cannot commit to app tomorrow.   msg sent to GIS to arrange a new appointment.

## 2019-06-18 NOTE — Unmapped (Signed)
LCSW reviewed pt chart to determine ICM eligibility. Pt does currently meet eligibility requirements. LCSW CM will outreach to pt.    Tish Frederickson  [facility] Care Manager  [phone]

## 2019-06-21 NOTE — Unmapped (Signed)
[facility] INTENSIVE CASE MANAGEMENT   Brief Note    LCSW contacted pt to offer ICM services. Pt reported that they do not believe they have a need for added support at this time. LCSW sent letter to pt via MyChart with our contact information in case they would like to discuss ICM services in the future.      Plan:       1. Patient/caregiver to: Reach out to ICM if needed in the future      Care Coordination Note updated in [facility]: Yes      Donette Larry, LCSW  ICM/Complex Case Manager  [phone]

## 2019-06-28 NOTE — Unmapped (Signed)
[facility]STRO[facility]OLOGY FACULTY PRACTICE   FOLLOW UP NOTE - INFLAMMATORY BOWEL DISEASE  06/29/2019    Demographics:  Kevin Tran is a 59 y.o. year old male    Diagnosis:  Ulcerative Colitis  Disease onset (yr):  2018  Age at onset:  > 60yr old (A3)  Location:  Left-sided (E2), mostly proctitis  Behavior:  Colitis  Current Tight Control Scenario:   Maintenance = Biologic/small molecule          HPI / NOTE :     VIDEO VISIT DUE TO COVID19    Interval Events:   1.  Last seen Nov 2020 - continued low dose Xeljanz, added Uceris foam x 6 weeks   2.  He contacted [name] late March, recurrent symptoms including rectal bleeding.  We restarted Uceris foam temporarily and plans for follow up with me in the clinic.    HPI:   Not doing well, lot of diarrhea and bleeding. BMs are a lot worse 10-15x/day sometimes.  When he goes to the bathroom lot of blood and mucus.  (+)urgency.   Weight is stable.    Abdominal pain (0-10): moderate  BM a day: 10-15x/day (baseline 2x/day)  Consistency: loose  % of stools have blood: 75%  Nocturnal BM: occasional  Urgency: yes++  Weight change over last 6 mo: weight stable  Smoking: no  NSAIDS: avoids    Review of Systems:   Review of systems positive for: negative except as above.   Otherwise, the balance of 10 systems is negative.           IBD HISTORY:     Year of disease onset:  2018    Brief IBD Disease Course:  In late 2018 developed bloody diarrhea and incontinence. Colonoscopy in Feb 2019 showed edema and erythema in the rectum and sigmoid with decreased vascularity in the rest of the colon; the TI appeared normal. He was treated with courses of prednisone for much of 2019 and also put on mesalamine without improvement. In Fall 2019 he was started on Humira w/o improvement. In Dec 2019 he had moderate level antibodies to humira; was added; mesalamine was stopped; put on prednisone taper; rapidly improved  - Jan 2020 - stopped mesalamine, used weekly Humira + with initial improvement.   - April 2020 - notable worsening of diarrhea and abdominal pain  Colonoscopy with moderate inflammation mainly in rectum.  Changed to [name] 06/2018.  Had limited response initially, but improved with Uceris foam 09/2018.    - 09/2018 - taper to xeljanz 5mg  BID.  Nov 2020 - ongoing proctitis symptoms, restarted Uceris foam.   - 06/2019 - severe UC flare on [name], plan to restage disease and change therapy    Endoscopy:      - Colonoscopy Feb 2019: edema + erythema in rectum and sigmoid; decreased vascularity in rest of colon  - Dec 2019: moderate inflammation throughout the colon  - Colonoscopy 07/01/2018 - poor prep, moderate proctitis up to 15cm, mild patchy nodularity in remaining colon.  Normal terminal ileum.     Imaging:    - CT 01/28/18: left sided colitis; liver lesion (see below)  - MRI abdomen to evaluate liver lesion 02/09/18 - aforementioned liver lesion felt to be focal fatty infiltration;  6 month repeat recommended    Prior IBD medications (type, dose, duration, response):  [x]  5-ASAs - Mesalamine  - no improvement; possible paradoxical diarrhea  [x]  Oral corticosteroids - prednisone.  Good response with budesonide foam.   []   Intravenous corticosteroids  []  Antibiotics  [x]  Thiopurines: started in Dec 2019  []  Methotrexate  [x]  Anti-TNF therapies - Humira since Sept or Oct 2019. Low level and low titer Ab in Dec 2019. Increased to weekly dosing. Stopped spring 2020 due to non-response.   []  Anti-Integrin therapies  []  Anti-Interleukin therapies  [x]  Anti-JAK therapies - Xeljanz 10mg  started 06/2018, tapered to 5mg  bid 09/2018  []  Cyclosporine  []  Clinical trial medication  []  Other (Please specify):    IBD health maintenance:  Influenza vaccine: given in Fall 2019  Pneumonia vaccine:   Hepatitis B: received twinrix 2 of 3;   TB testing: Quant gold neg in March 2019  Chickenpox/Shingles history: Shingrix completed 09/2018  Bone denistometry:   Derm appointment:  Last small bowel imaging: Last colonoscopy: Dec 2019  PAP smear:     Extraintestinal manifestations:   -joint pains affecting: n  -eye: n  -skin: n  -oral ulcers :  n  -blood clots: n  -PSC: n  -other: n          Past Medical History:   Past medical history:   Past Medical History:   Diagnosis Date   ??? Arthritis    ??? Pneumothorax    ??? Ulcerative colitis (CMS-HCC)      Past surgical history:   Past Surgical History:   Procedure Laterality Date   ??? BACK SURGERY  1996   ??? COLONOSCOPY     ??? PR COLONOSCOPY W/BIOPSY SINGLE/MULTIPLE Left 07/01/2018    Procedure: COLONOSCOPY, FLEXIBLE, PROXIMAL TO SPLENIC FLEXURE; WITH BIOPSY, SINGLE OR MULTIPLE;  Surgeon: [name], MD;  Location: [facility];  Service: Gastroenterology     Family history:   Family History   Problem Relation Age of Onset   ??? Cancer Mother      Social history:   Social History     Socioeconomic History   ??? Marital status: Married     Spouse name: None   ??? Number of children: None   ??? Years of education: None   ??? Highest education level: None   Occupational History   ??? None   Tobacco Use   ??? Smoking status: Former Smoker     Types: Cigarettes   ??? Smokeless tobacco: Never Used   Vaping Use   ??? Vaping Use: Never used   Substance and Sexual Activity   ??? Alcohol use: No   ??? Drug use: No   ??? Sexual activity: None   Other Topics Concern   ??? None   Social History Narrative   ??? None     Social Determinants of Health     Financial Resource Strain:    ??? Difficulty of Paying Living Expenses:    Food Insecurity:    ??? Worried About Programme researcher, broadcasting/film/video in the Last Year:    ??? Barista in the Last Year:    Transportation Needs:    ??? Freight forwarder (Medical):    ??? Lack of Transportation (Non-Medical):    Physical Activity:    ??? Days of Exercise per Week:    ??? Minutes of Exercise per Session:    Stress:    ??? Feeling of Stress :    Social Connections:    ??? Frequency of Communication with Friends and Family:    ??? Frequency of Social Gatherings with Friends and Family: ??? Attends Religious Services:    ??? Database administrator or Organizations:    ???  Attends Banker Meetings:    ??? Marital Status:              Allergies:   No Known Allergies          Medications:     Current Outpatient Medications   Medication Sig Dispense Refill   ??? acetaminophen (TYLENOL) 500 MG tablet Take 500 mg by mouth every six (6) hours as needed.      ??? budesonide 2 mg/actuation Foam Insert 1 Dose into the rectum two (2) times a day. 1 metered dose rectally twice daily x 14 days, then once daily x 28 days. 66.8 g 1   ??? fluticasone propionate (FLONASE) 50 mcg/actuation nasal spray USE 2 SPRAY IN EACH NOSTRIL ONCE DAILY FOR ALLERGIES     ??? gabapentin (NEURONTIN) 300 MG capsule Take 1 capsule (300 mg total) by mouth nightly. Take 2-3 tabs PO qhs for pain 90 capsule 2   ??? multivitamin capsule Take 1 capsule by mouth daily.      ??? omeprazole (PRILOSEC) 20 MG capsule Take 20 mg by mouth daily.      ??? pregabalin (LYRICA) 225 MG capsule TK ONE C PO  BID     ??? XELJANZ 5 mg Tab tablet TAKE ONE TABLET BY MOUTH TWICE DAILY. MAY BE TAKEN WITH OR WITHOUT FOOD. STORE AT ROOM TEMPERATURE. 60 tablet 0   ??? budesonide 2 mg/actuation Foam Insert 1 Dose into the rectum two (2) times a day. 1 metered dose rectally twice daily x 14 days, then once daily x 28 days. 66.8 g 1     No current facility-administered medications for this visit.             Physical Exam:   There were no vitals taken for this visit.    VIDEO VISIT - no vitals and limited physical exam    GEN: well appearing male in no apparent distress, appears comfortable on exam  HEENT: eyes normal and symmetric  NEURO:  Normal speech, face symmetric  PULM:  Respirations comfortable  SKIN:  No visible rash on face/neck  Psych: affect appropriate, A&O x3          Labs, Data & Indices:     Lab Review:   Lab Results   Component Value Date    WBC 5.0 10/08/2018    RBC 4.84 10/08/2018    HGB 13.1 10/08/2018     Lab Results   Component Value Date    AST 15 10/08/2018    ALT 14 10/08/2018    BUN 14 10/08/2018    Creatinine 0.93 10/08/2018    CO2 26 10/08/2018    Albumin 4.1 03/03/2018    Calcium 9.7 10/08/2018     No results found for: TSH   ...............................................................................................................................................Marland Kitchen  Modified Mayo Score for Ulcerative Colitis  Stool Frequency:  3 = > 5 more than normal  Rectal bleeding:   2 = Obvious blood most of time  Physicians Global Assessment:  3 = Severe colitis  Score: 8  Remission <2  ............................................................................................................................................  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 High risk medication use  Z79.899            Assessment & Recommendations:   Disease state:    Kevin Tran is a 59 y.o. male with ulcerative pancolitis (mostly proctosigmoiditis)  since 2018. He was started on Humira in 2019.  By April 2020, he had a mechanistic non-response to Humira (good drug level and moderate to severe proctitis).  We changed to [name] in May 2020 with only partial response.  He now appears to be having a severe colitis flare on Xeljanz.      We will plan to re-stage his disease activity with a colonoscopy and tentatively change to alternative medications.  I discussed various options with him in detail including pros and cons of Ustekinumab, vedolizumab, infliximab.  I discussed risks of very each medication including immune suppression, skin cancer, malignancy risk and others.  Presuming his colonoscopy shows moderate to severe colitis we will tentatively plan to change to Ustekinumab.    We will also use Uceris foam to try to blunt his symptoms given that he has had very good response to this in the past and is reluctant to be on prednisone.  If symptoms do not improve we will start prednisone.    PLAN:  1.  Start Uceris foam twice daily for now.  Rx sent to [facility] in [place]   2.  We will plan for a colonoscopy at [facility], preferrably at [facility] if possible  3.  Get lab work done at [employer] [facility]  4.  Please call if symptoms worsen (bowel symptoms, joint pain, or dehydation) in which case we would discuss starting prednisone, or possibly even hospital admission.   5.  We will tentatively plan to start Stelara after the colonoscopy.  --------------------------------------------------------  Author: [name] 06/29/2019 5:06 PM    [name], MD  Assistant Professor of Medicine  Division of Gastroenterology & Hepatology  [facility]  =====================================================      I spent 21 minutes on the real-time audio and video visit with the patient on the date of service. I spent an additional 14 minutes on pre- and post-visit activities on the date of service.     The patient was not located and I was located within 250 yards of a hospital based location during the real-time audio and video visit. The patient was physically located in [place] or a state in which I am permitted to provide care. The patient and/or parent/guardian understood that s/he may incur co-pays and cost sharing, and agreed to the telemedicine visit. The visit was reasonable and appropriate under the circumstances given the patient's presentation at the time.    The patient and/or parent/guardian has been advised of the potential risks and limitations of this mode of treatment (including, but not limited to, the absence of in-person examination) and has agreed to be treated using telemedicine. The patient's/patient's family's questions regarding telemedicine have been answered.    If the visit was completed in an ambulatory setting, the patient and/or parent/guardian has also been advised to contact their provider???s office for worsening conditions, and seek emergency medical treatment and/or call 911 if the patient deems either necessary.

## 2019-06-29 ENCOUNTER — Telehealth: Admit: 2019-06-29 | Discharge: 2019-06-30 | Payer: PRIVATE HEALTH INSURANCE

## 2019-06-29 MED ORDER — BUDESONIDE 2 MG/ACTUATION RECTAL FOAM
Freq: Two times a day (BID) | RECTAL | 1 refills | 0.00000 days | Status: CP
Start: 2019-06-29 — End: ?

## 2019-06-29 NOTE — Unmapped (Signed)
Kevin Tran it was a pleasure seeing you today.  Here is a summary/wrap up from today's visit:     1.  Start Uceris foam twice daily for now.  Rx sent to [facility] in [place]   2.  We will plan for a colonoscopy at [facility], preferrably at [facility] if possible  3.  Get lab work done at [facility]  4.  Please call if symptoms worsen (bowel symptoms, joint pain, or dehydation) in which case we would discuss starting prednisone, or possibly even hospital admission.   5.  We will tentatively plan to start Stelara after the colonooscopy. If any trouble or symptoms, do not hesitate to call.     [name], MD  Assistant Professor of Medicine  Division of Gastroenterology & Hepatology  [place] [facility] [place]    Clinical Nurse Contact:  Neta Mends, RN  Ph#  [phone]  Fax#  [phone]    [facility] COVID19 Vaccine Information: [email or web address]    For clinic appointments, please call, [phone], option 1.  For questions regarding radiology appointments, or to schedule, [phone].  For questions regarding scheduling GI procedures (e.g,. Colonoscopy), please call, [phone], option 2.    For educational material and resources:  [email or web address]  ============================================

## 2019-07-01 NOTE — Unmapped (Signed)
Updated lab request form sent to pt via mychart with labs per Dr Raphael Gibney - cbc, cmp, crp and esr     Pt has recent quant gold and hep b from 05/2018

## 2019-07-26 DIAGNOSIS — K5100 Ulcerative (chronic) pancolitis without complications: Principal | ICD-10-CM

## 2019-07-26 MED ORDER — XELJANZ 5 MG TABLET
ORAL_TABLET | 0 refills | 0 days
Start: 2019-07-26 — End: ?

## 2019-07-27 NOTE — Unmapped (Signed)
Called pt today as at visit with Dr Raphael Gibney on 06/29/19, pt was to schedule colonoscopy due to worsening symptoms. He has not scheduled this procedure yet and he is due for [facility] safety monitoring labs. Lab request form was sent to pt on  07/01/19 but no results have been sent to MD office.   Dr. Raphael Gibney and Mr Michell Heinrich discussed switching to stelara after colonoscopy, however pt has not scheduled procedure.     Called pt, he did not answer, left VM with number for [facility] for procedure scheduling and for RN to discuss labwork

## 2019-07-29 NOTE — Unmapped (Signed)
Spoke to pt wife today, she requests lab request form be emailed to her at [email or web address]    Discussed pt symptoms, she states since starting uceris foam, Kevin Tran has been doing very well. Discussed importance of colonoscopy to determine efficacy of xeljanz. Provided GI procedure phone number to Robin and asked she call today to schedule.  She verbalized understanding

## 2019-07-30 NOTE — Unmapped (Signed)
Received vm from pt this morning, 07/30/19 stating he went to [facility] but they cannot see electronic orders placed on 06/07/19 at [facility]. He requests a physical form to bring to [facility].    RN had spoke to Zella Ball ( pt wife) yesterday and emailed form to her as requested. Requsted she call RN back within if she did not receive it, did not receive another call from her on 07/29/19  Also sent to pt mychart which they can print off.     Called pt today 07/30/19, no answer from home number listed. Left vm.

## 2019-08-02 DIAGNOSIS — K5130 Ulcerative (chronic) rectosigmoiditis without complications: Principal | ICD-10-CM

## 2019-08-02 NOTE — Unmapped (Addendum)
Pt called stating [facility] (located [facility] in [place], [name]) would not accept the paper lab request form sent to pt on 07/01/19 and again on 07/29/19.    Ordered labs within [employer].     Called pt back to confirm correct [facility] location to fax paper form as well from MD office. Pt did not answer, left Vm to call RN back     Pt wife Zella Ball called RN back and confirmed this is the correct [facility] in [place] - [address]. Phone number:    [phone]    RN called above number to obtain fax number for [facility] however no one answered, left VM with call back number at [facility]. Updated pt wife as well that RN had to leave a msg to obtain fax number

## 2019-08-05 LAB — ERYTHROCYTE SEDIMENTATION RATE: Erythrocyte sedimentation rate:Vel:Pt:Bld:Qn:Westergren: 14

## 2019-08-05 LAB — COMPREHENSIVE METABOLIC PANEL
A/G RATIO: 1.7 (ref 1.2–2.2)
ALBUMIN: 4.6 g/dL (ref 3.8–4.9)
ALKALINE PHOSPHATASE: 58 IU/L (ref 48–121)
ALT (SGPT): 20 IU/L (ref 0–44)
AST (SGOT): 19 IU/L (ref 0–40)
BILIRUBIN TOTAL: 0.6 mg/dL (ref 0.0–1.2)
BLOOD UREA NITROGEN: 14 mg/dL (ref 8–27)
BUN / CREAT RATIO: 14 (ref 10–24)
CHLORIDE: 104 mmol/L (ref 96–106)
CO2: 24 mmol/L (ref 20–29)
CREATININE: 0.99 mg/dL (ref 0.76–1.27)
GFR MDRD NON AF AMER: 82 mL/min/{1.73_m2}
GLOBULIN, TOTAL: 2.7 g/dL (ref 1.5–4.5)
SODIUM: 140 mmol/L (ref 134–144)
TOTAL PROTEIN: 7.3 g/dL (ref 6.0–8.5)

## 2019-08-05 LAB — CBC
HEMATOCRIT: 44.5 % (ref 37.5–51.0)
MEAN CORPUSCULAR HEMOGLOBIN CONC: 33 g/dL (ref 31.5–35.7)
MEAN CORPUSCULAR HEMOGLOBIN: 27.5 pg (ref 26.6–33.0)
MEAN CORPUSCULAR VOLUME: 83 fL (ref 79–97)
PLATELET COUNT: 252 10*3/uL (ref 150–450)
RED BLOOD CELL COUNT: 5.34 x10E6/uL (ref 4.14–5.80)
WHITE BLOOD CELL COUNT: 4.9 10*3/uL (ref 3.4–10.8)

## 2019-08-05 LAB — SODIUM: Sodium:SCnc:Pt:Ser/Plas:Qn:: 140

## 2019-08-05 LAB — SEDIMENTATION RATE, MANUAL: ERYTHROCYTE SEDIMENTATION RATE: 14 mm/h (ref 0–30)

## 2019-08-05 LAB — HEMOGLOBIN: Hemoglobin:MCnc:Pt:Bld:Qn:: 14.7

## 2019-08-05 LAB — C-REACTIVE PROTEIN: C reactive protein:MCnc:Pt:Ser/Plas:Qn:: <1

## 2019-08-06 NOTE — Unmapped (Signed)
Reason For Call:  Pt wife Zella Ball called today stating pt was able to get labs done at [facility] on wed. He is feeling much better and would like to stay on xeljanz.      Assessment:   Labs reviewed from 08/04/19. All within normal limits     Instructions Provided:  Left VM with call back information     Workup Time:   

## 2019-08-30 DIAGNOSIS — Z79899 Other long term (current) drug therapy: Principal | ICD-10-CM

## 2019-09-17 NOTE — Unmapped (Signed)
[facility]   FOLLOW UP NOTE - INFLAMMATORY BOWEL DISEASE  09/21/2019    Demographics:  Kevin Tran is a 59 y.o. year old male    Diagnosis:  Ulcerative Colitis  Disease onset (yr):  2018  Age at onset:  > 33yr old (A3)  Location:  Left-sided (E2)  Behavior:  Colitis  Current Tight Control Scenario:   Maintenance = Biologic/small molecule          HPI / NOTE :     PHONE VISIT DUE TO COVID19  * Patient unable to make video connection    Interval Events:   1.  Last seen 06/2019 - plan to restage disease with colonoscopy and likely change to Stelara.  We also rx Uceris foam at that time.   2.  Has not had colonoscopy yet  3.  I reviewed labs.  08/04/2019 - CMP ok, ESR nml 14, CRP < 1, CBC nml, hgb 14.7, MCV 83, Plt 252, WBC nml    HPI:   He does feel better than May 2021 - he feels that the Uceris foam does help considerably.  Still very symptomatic. 5-6 loose stools per day and also has episodes of bad spells with worse symptoms for 1-2 days at a time.     He is also endorsing considerable polyarthralgia which sound like possible extraintestinal manifestations.    Abdominal pain (0-10): moderate  BM a day: 5-6x/day (baseline 2x/day), sometimes bad spells with intense diarrhea (>>10x/day)  Consistency: loose  % of stools have blood: 0%  Nocturnal BM: no  Urgency: yes+  Weight change over last 6 mo: weight stable  Smoking: no  NSAIDS: avoids    Review of Systems:   Review of systems positive for: negative except as above.   Otherwise, the balance of 10 systems is negative.           IBD HISTORY:     Year of disease onset:  2018    Brief IBD Disease Course:  In late 2018 developed bloody diarrhea and incontinence. Colonoscopy in Feb 2019 showed edema and erythema in the rectum and sigmoid with decreased vascularity in the rest of the colon; the TI appeared normal. He was treated with courses of prednisone for much of 2019 and also put on mesalamine without improvement. In Fall 2019 he was started on Humira w/o improvement. In Dec 2019 he had moderate level antibodies to humira; was added; mesalamine was stopped; put on prednisone taper; rapidly improved  - Jan 2020 - stopped mesalamine, used weekly Humira + with initial improvement.   - April 2020 - notable worsening of diarrhea and abdominal pain  Colonoscopy with moderate inflammation mainly in rectum.  Changed to [name] 06/2018.  Had limited response initially, but improved with Uceris foam 09/2018.    - 09/2018 - taper to [name] 5mg  BID.  Nov 2020 - ongoing proctitis symptoms, restarted Uceris foam.   - 06/2019 - severe UC flare on [name], plan to restage disease and change therapy    Endoscopy:      - Colonoscopy Feb 2019: edema + erythema in rectum and sigmoid; decreased vascularity in rest of colon  - Dec 2019: moderate inflammation throughout the colon  - Colonoscopy 07/01/2018 - poor prep, moderate proctitis up to 15cm, mild patchy nodularity in remaining colon.  Normal terminal ileum.     Imaging:    - CT 01/28/18: left sided colitis; liver lesion (see below)  - MRI abdomen to evaluate  liver lesion 02/09/18 - aforementioned liver lesion felt to be focal fatty infiltration;  6 month repeat recommended    Prior IBD medications (type, dose, duration, response):  [x]  5-ASAs - Mesalamine  - no improvement; possible paradoxical diarrhea  [x]  Oral corticosteroids - prednisone.  Good response with budesonide foam.   []  Intravenous corticosteroids  []  Antibiotics  [x]  Thiopurines: started in Dec 2019  []  Methotrexate  [x]  Anti-TNF therapies - Humira since Sept or Oct 2019. Low level and low titer Ab in Dec 2019. Increased to weekly dosing. Stopped spring 2020 due to non-response despite good drug levels.   []  Anti-Integrin therapies  []  Anti-Interleukin therapies  [x]  Anti-JAK therapies - Xeljanz 10mg  started 06/2018, tapered to 5mg  bid 09/2018  []  Cyclosporine  []  Clinical trial medication  []  Other (Please specify):    IBD health maintenance:  Influenza vaccine: given in Fall 2019  Pneumonia vaccine:   Hepatitis B: received twinrix 2 of 3;   TB testing: Quant gold neg in March 2019  Chickenpox/Shingles history: Shingrix completed 09/2018  Bone denistometry:   Derm appointment:  Last small bowel imaging:   Last colonoscopy: Dec 2019  PAP smear:     Extraintestinal manifestations:   -joint pains affecting: n  -eye: n  -skin: n  -oral ulcers :  n  -blood clots: n  -PSC: n  -other: n          Past Medical History:   Past medical history:   Past Medical History:   Diagnosis Date   ??? Arthritis    ??? Pneumothorax    ??? Ulcerative colitis (CMS-HCC)      Past surgical history:   Past Surgical History:   Procedure Laterality Date   ??? BACK SURGERY  1996   ??? COLONOSCOPY     ??? PR COLONOSCOPY W/BIOPSY SINGLE/MULTIPLE Left 07/01/2018    Procedure: COLONOSCOPY, FLEXIBLE, PROXIMAL TO SPLENIC FLEXURE; WITH BIOPSY, SINGLE OR MULTIPLE;  Surgeon: [name], MD;  Location: [facility];  Service: Gastroenterology     Family history:   Family History   Problem Relation Age of Onset   ??? Cancer Mother      Social history:   Social History     Socioeconomic History   ??? Marital status: Married     Spouse name: Not on file   ??? Number of children: Not on file   ??? Years of education: Not on file   ??? Highest education level: Not on file   Occupational History   ??? Not on file   Tobacco Use   ??? Smoking status: Former Smoker     Types: Cigarettes   ??? Smokeless tobacco: Never Used   Vaping Use   ??? Vaping Use: Never used   Substance and Sexual Activity   ??? Alcohol use: No   ??? Drug use: No   ??? Sexual activity: Not on file   Other Topics Concern   ??? Not on file   Social History Narrative   ??? Not on file     Social Determinants of Health     Financial Resource Strain:    ??? Difficulty of Paying Living Expenses:    Food Insecurity:    ??? Worried About Programme researcher, broadcasting/film/video in the Last Year:    ??? Ran Out of Food in the Last Year:    Transportation Needs:    ??? Freight forwarder (Medical):    ??? Lack of Transportation (Non-Medical):  Physical Activity:    ??? Days of Exercise per Week:    ??? Minutes of Exercise per Session:    Stress:    ??? Feeling of Stress :    Social Connections:    ??? Frequency of Communication with Friends and Family:    ??? Frequency of Social Gatherings with Friends and Family:    ??? Attends Religious Services:    ??? Database administrator or Organizations:    ??? Attends Engineer, structural:    ??? Marital Status:              Allergies:   No Known Allergies          Medications:     Current Outpatient Medications   Medication Sig Dispense Refill   ??? acetaminophen (TYLENOL) 500 MG tablet Take 500 mg by mouth every six (6) hours as needed.      ??? budesonide 2 mg/actuation Foam Insert 1 Dose into the rectum two (2) times a day. 1 metered dose rectally twice daily x 14 days, then once daily x 28 days. 66.8 g 1   ??? budesonide 2 mg/actuation Foam Insert 1 Dose into the rectum two (2) times a day. 1 metered dose rectally twice daily x 14 days, then once daily x 28 days. 66.8 g 1   ??? fluticasone propionate (FLONASE) 50 mcg/actuation nasal spray USE 2 SPRAY IN EACH NOSTRIL ONCE DAILY FOR ALLERGIES     ??? gabapentin (NEURONTIN) 300 MG capsule Take 1 capsule (300 mg total) by mouth nightly. Take 2-3 tabs PO qhs for pain 90 capsule 2   ??? multivitamin capsule Take 1 capsule by mouth daily.      ??? omeprazole (PRILOSEC) 20 MG capsule Take 20 mg by mouth daily.      ??? pregabalin (LYRICA) 225 MG capsule TK ONE C PO  BID     ??? XELJANZ 5 mg Tab tablet TAKE ONE TABLET BY MOUTH TWICE DAILY. MAY BE TAKEN WITH OR WITHOUT FOOD. STORE AT ROOM TEMPERATURE. 60 tablet 0     No current facility-administered medications for this visit.             Physical Exam:   There were no vitals taken for this visit.    PHONE VISIT - no vitals or physical exam          Labs, Data & Indices:     Lab Review:   Lab Results   Component Value Date    WBC 4.9 08/04/2019    RBC 5.34 08/04/2019    HGB 14.7 08/04/2019     Lab Results   Component Value Date    AST 19 08/04/2019    ALT 20 08/04/2019    BUN 14 08/04/2019    Creatinine 0.99 08/04/2019    CO2 24 08/04/2019    Albumin 4.1 03/03/2018    Calcium 10.0 08/04/2019     No results found for: TSH   ...............................................................................................................................................Marland Kitchen  Modified Mayo Score for Ulcerative Colitis  Stool Frequency:  2 = 3-4 more than normal  Rectal bleeding:   0 = None  Physicians Global Assessment:  2 =Moderate colitis  Score: 4  Remission <2  ............................................................................................................................................  ORDERS THIS VISIT:       Diagnosis ICD-10-CM Associated Orders   1. 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 High risk medication use  Z79.899    3. Medication management  Z79.899    4. Other fatigue  R53.83    5. Arthralgia, unspecified joint  M25.50            Assessment & Recommendations:   Disease state:    GENE GLAZEBROOK is a 59 y.o. male with ulcerative pancolitis (mostly proctosigmoiditis) since 2018. He was started on Humira in 2019.  By April 2020, he had a mechanistic non-response to Humira (good drug level and moderate to severe proctitis).  We changed to [name] in May 2020 with only partial response.  He now appears to be having a severe colitis flare on Xeljanz.  He has had some improvement with the use of Uceris foam but remains quite symptomatic.  After discussion with him today we will go ahead with a colonoscopy to restage his disease and then change therapy.  Assuming the colonoscopy confirms active disease we will plan to change to Ustekinumab (we will could not proceed with insurance authorization for this).  I asked him to not take any Uceris foam for at least 1 week prior to the colonoscopy.      I did discuss risks and benefits of Ustekinumab with him.  Also discussed other therapeutic options including infliximab or Vedolizumab (he is not interested in clinical trials at this time).     PLAN:  1.  Colonoscopy next available to restage his disease.  He would prefer [facility].   2.  Tentatively plan to start Ustekinumab after the colonoscopy, will work on Therapist, occupational now.  3.  Could restart Uceris foam after the colonoscopy to help with symptom control.  4.  Follow-up with me in 3months.   --------------------------------------------------------  Author: [name] 09/21/2019 5:27 PM    [name], MD  Assistant Professor of Medicine  Division of Gastroenterology & Hepatology  [place] [facility]  =====================================================      I spent 27 minutes on the phone visit with the patient on the date of service. I spent an additional 12 minutes on pre- and post-visit activities on the date of service.     The patient was not located and I was located within 250 yards of a hospital based location during the phone visit. The patient was physically located in [place] or a state in which I am permitted to provide care. The patient and/or parent/guardian understood that s/he may incur co-pays and cost sharing, and agreed to the telemedicine visit. The visit was reasonable and appropriate under the circumstances given the patient's presentation at the time.    The patient and/or parent/guardian has been advised of the potential risks and limitations of this mode of treatment (including, but not limited to, the absence of in-person examination) and has agreed to be treated using telemedicine. The patient's/patient's family's questions regarding telemedicine have been answered.    If the visit was completed in an ambulatory setting, the patient and/or parent/guardian has also been advised to contact their provider???s office for worsening conditions, and seek emergency medical treatment and/or call 911 if the patient deems either necessary.

## 2019-09-21 ENCOUNTER — Telehealth: Admit: 2019-09-21 | Discharge: 2019-09-22 | Payer: PRIVATE HEALTH INSURANCE

## 2019-09-21 DIAGNOSIS — K5190 Ulcerative colitis, unspecified, without complications: Principal | ICD-10-CM

## 2019-09-21 NOTE — Unmapped (Signed)
Per Dr Raphael Gibney, pt to start stelara - pt needs colonoscopy prior to starting.   TP placed today

## 2019-09-22 DIAGNOSIS — K5190 Ulcerative colitis, unspecified, without complications: Principal | ICD-10-CM

## 2019-09-22 MED ORDER — BISACODYL 5 MG TABLET,DELAYED RELEASE
ORAL_TABLET | Freq: Once | ORAL | 0 refills | 1 days | Status: CP
Start: 2019-09-22 — End: 2019-09-22
  Filled 2019-09-24: qty 238, 1d supply, fill #0
  Filled 2019-09-24: qty 119, 1d supply, fill #0

## 2019-09-22 MED ORDER — POLYETHYLENE GLYCOL 3350 17 GRAM/DOSE ORAL POWDER: g | 0 refills | 0 days | Status: AC

## 2019-09-22 MED ORDER — POLYETHYLENE GLYCOL 3350 17 GRAM/DOSE ORAL POWDER
ORAL | 0 refills | 0.00000 days | Status: CP
Start: 2019-09-22 — End: ?

## 2019-09-22 MED ORDER — SIMETHICONE 125 MG CHEWABLE TABLET
ORAL_TABLET | ORAL | 0 refills | 0 days | Status: CP
Start: 2019-09-22 — End: ?
  Filled 2019-09-24: qty 4, 1d supply, fill #0

## 2019-09-23 DIAGNOSIS — K5100 Ulcerative (chronic) pancolitis without complications: Principal | ICD-10-CM

## 2019-09-23 MED ORDER — XELJANZ 5 MG TABLET
ORAL_TABLET | 0 refills | 0 days
Start: 2019-09-23 — End: ?

## 2019-09-24 MED FILL — SIMETHICONE 125 MG CHEWABLE TABLET: 1 days supply | Qty: 4 | Fill #0 | Status: AC

## 2019-09-24 MED FILL — CLEARLAX 17 GRAM/DOSE ORAL POWDER: 1 days supply | Qty: 119 | Fill #0 | Status: AC

## 2019-09-24 MED FILL — BISACODYL 5 MG TABLET,DELAYED RELEASE: ORAL | 1 days supply | Qty: 2 | Fill #0

## 2019-09-24 MED FILL — BISACODYL 5 MG TABLET,DELAYED RELEASE: 1 days supply | Qty: 2 | Fill #0 | Status: AC

## 2019-09-24 MED FILL — CLEARLAX 17 GRAM/DOSE ORAL POWDER: 1 days supply | Qty: 238 | Fill #0 | Status: AC

## 2019-09-26 MED ORDER — XELJANZ 5 MG TABLET
ORAL_TABLET | 0 refills | 0 days
Start: 2019-09-26 — End: ?

## 2019-09-29 NOTE — Unmapped (Signed)
Reason For Call:  Spoke with pt today regarding plan for stelara. Pt has been getting called for TIC to scheudle his stelara (due to new system with the TP that automatically sends notification to TIC sheduling). Pt was to wait until after his colonoscopy though to start stelara and continue xeljanz until then. Per Zella Ball, the soonest colonoscopy app with Dr Raphael Gibney at [place] was 11/10/19.   Will discuss with Dr Raphael Gibney if plan is still to wait to start stelara until after 9/29 scopes.   Per Zella Ball, if they are out, can leave a message of VM with Dr Rachael Darby recommendation        Workup Time:   10

## 2019-10-05 NOTE — Unmapped (Signed)
Reason For Call:  Pt has moved up his colonoscopy appointment to 10/20/19.  Per Dr. Raphael Gibney, ok for pt to schedule stelara infusion after 10/20/19 date.   Called pt to discuss however he did not answer, left vm. And sent mychart msg      Workup Time:   

## 2019-10-14 DIAGNOSIS — K5100 Ulcerative (chronic) pancolitis without complications: Principal | ICD-10-CM

## 2019-10-14 MED ORDER — XELJANZ 5 MG TABLET
ORAL_TABLET | 0 refills | 0.00000 days
Start: 2019-10-14 — End: ?

## 2019-10-20 ENCOUNTER — Encounter: Admit: 2019-10-20 | Discharge: 2019-10-20 | Payer: PRIVATE HEALTH INSURANCE

## 2019-10-20 ENCOUNTER — Ambulatory Visit: Admit: 2019-10-20 | Discharge: 2019-10-20 | Payer: PRIVATE HEALTH INSURANCE

## 2019-10-20 DIAGNOSIS — K5100 Ulcerative (chronic) pancolitis without complications: Principal | ICD-10-CM

## 2019-10-20 MED ORDER — XELJANZ 5 MG TABLET
ORAL_TABLET | 0 refills | 0.00000 days
Start: 2019-10-20 — End: ?

## 2019-10-20 MED ADMIN — lidocaine (XYLOCAINE) 20 mg/mL (2 %) injection: INTRAVENOUS | @ 19:00:00 | Stop: 2019-10-20

## 2019-10-20 MED ADMIN — propofoL (DIPRIVAN) injection: INTRAVENOUS | @ 19:00:00 | Stop: 2019-10-20

## 2019-10-20 MED ADMIN — propofol (DIPRIVAN) infusion 10 mg/mL: INTRAVENOUS | @ 19:00:00 | Stop: 2019-10-20

## 2019-10-20 MED ADMIN — sodium chloride (NS) 0.9 % infusion: 10 mL/h | INTRAVENOUS | @ 18:00:00 | Stop: 2019-10-20

## 2019-10-22 MED ORDER — MESALAMINE 1,000 MG RECTAL SUPPOSITORY
Freq: Two times a day (BID) | RECTAL | 2 refills | 15 days | Status: CP
Start: 2019-10-22 — End: 2020-10-21

## 2019-10-22 NOTE — Unmapped (Signed)
Spoke with patient on phone today to review colonoscopy results from 2 days ago.  Moderate proctitis mainly in the very distal 10 to 20 cm of the rectum.  He does wish to go and proceed with Stelara.  We will also start twice daily Canasa suppositories now to see if that provides improvement.  I have sent in the prescription.  If he does not notice much benefit after 2 weeks on Canasa then we can add back the budesonide foam.

## 2019-10-25 ENCOUNTER — Encounter: Admit: 2019-10-25 | Discharge: 2019-10-26 | Payer: PRIVATE HEALTH INSURANCE

## 2019-10-25 DIAGNOSIS — K5130 Ulcerative (chronic) rectosigmoiditis without complications: Principal | ICD-10-CM

## 2019-10-25 MED ORDER — STELARA 90 MG/ML SUBCUTANEOUS SYRINGE
SUBCUTANEOUS | 5 refills | 56 days | Status: CP
Start: 2019-10-25 — End: ?

## 2019-10-25 MED ADMIN — ustekinumab (STELARA) 390 mg in sodium chloride (NS) 250 mL IVPB: 390 mg | INTRAVENOUS | @ 19:00:00 | Stop: 2019-10-25

## 2019-10-25 NOTE — Unmapped (Signed)
Pt has stelara loading dose today - stel[facility] subcutaneous dose sent to [facility] and needs a PA

## 2019-10-25 NOTE — Unmapped (Signed)
Patient presents for Stelara infusion. 24G IV placed in R forearm, VSS.   1447 Stelara 390 mg started  1548  infusion stopped.  IV d/c'ed, VSS and patient discharged in stable condition.

## 2019-10-27 DIAGNOSIS — K5130 Ulcerative (chronic) rectosigmoiditis without complications: Principal | ICD-10-CM

## 2019-10-27 NOTE — Unmapped (Signed)
Reason For Call:  Pt wife Zella Ball called with concerns about stelara PA.      Called back to discuss, she did not answer, left vm that we are working on it and will let you know when it is approved. Pt just got his IV dose on 10/25/19 and dose not need this for 8 more weeks

## 2019-10-28 ENCOUNTER — Other Ambulatory Visit: Payer: Self-pay | Admitting: General Surgery

## 2019-10-28 ENCOUNTER — Other Ambulatory Visit: Payer: Self-pay | Admitting: Otolaryngology

## 2019-10-28 DIAGNOSIS — IMO0001 Reserved for inherently not codable concepts without codable children: Secondary | ICD-10-CM

## 2019-10-28 DIAGNOSIS — H9202 Otalgia, left ear: Secondary | ICD-10-CM

## 2019-11-01 NOTE — Unmapped (Signed)
Reason For Call:  Called pt and wife Zella Ball today to inform that that his stelara 90mg  subcutaneous dose is approved with insurance and he may call [facility] to set up delivery     Assessment:   Did not answer, left vm with above information and that he is due on 12/20/19 for his first subcutaneous dose     Follow Up:  -Call RN with further questions or concerns        Workup Time:   10 min

## 2019-11-03 NOTE — Unmapped (Signed)
Reason For Call:  Pt wife called to discuss need for flu shot.      Assessment:   Wanted to confirm pt should get the flu shot while taking stelara. Spoke to Zella Ball, confirmed that is our recommendation and he can get this done at a local pharmacy or PCP. She verbalized understanding

## 2019-11-15 ENCOUNTER — Ambulatory Visit: Payer: BLUE CROSS/BLUE SHIELD

## 2019-11-16 DIAGNOSIS — K5130 Ulcerative (chronic) rectosigmoiditis without complications: Principal | ICD-10-CM

## 2019-11-16 MED ORDER — BUDESONIDE 2 MG/ACTUATION RECTAL FOAM
RECTAL | 1 refills | 0.00000 days | Status: CP
Start: 2019-11-16 — End: ?

## 2019-11-16 NOTE — Unmapped (Signed)
Received call pt today stating [facility] informed them that pt needs TB screening prior to starting stelara. Spoke to [place] today, informed her that pt had qantiferon TB done on 06/09/2018 which was negative and does not need to repeat this at this time.     Zella Ball states that ever since stopping xeljanz, he has watery brown stool multiple times a day. Is spending the day in the bathroom again and BID canasa is not helping. Per Dr Raphael Gibney note, can switch to budesonide foam if canasa ineffective. Zella Ball will let pt know he may switch to budesonide foam bid or can do canasa in Am and foam at night.     Pt stelara infusion was given on 10/25/19

## 2019-11-22 DIAGNOSIS — Z79899 Other long term (current) drug therapy: Principal | ICD-10-CM

## 2020-01-01 IMAGING — CT CT CTA ABD/PEL W/CM AND/OR W/O CM
2 of 9 series · 8 of 46 positions shown, 14 images · IV contrast (iopamidol)
Comparison: 06/19/2004

CLINICAL DATA: 57-year-old male with a history of ulcerative
colitis diagnosis. Diarrhea

EXAM:
CTA ABDOMEN AND PELVIS wITHOUT AND WITH CONTRAST
TECHNIQUE: Multidetector CT imaging of the abdomen and pelvis was performed
using the standard protocol during bolus administration of
intravenous contrast. Multiplanar reconstructed images and MIPs were
obtained and reviewed to evaluate the vascular anatomy.
CONTRAST:  100mL [id number] IOPAMIDOL ([id number]) INJECTION 76%

[Series 8: cor art st cta arterial · coronal · arterial · 0.67mm/px · 2 of 126 slices shown, 3 images]
[im 42/126  soft-tissue]
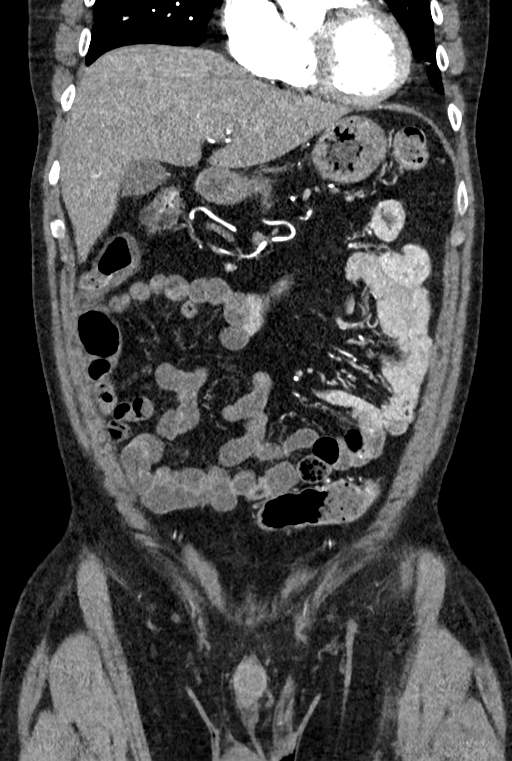
[im 42/126  bone]
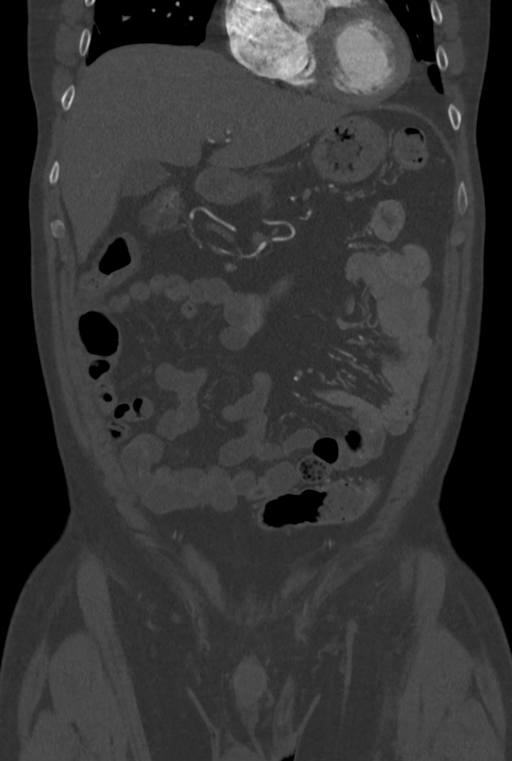
[im 84/126  soft-tissue]
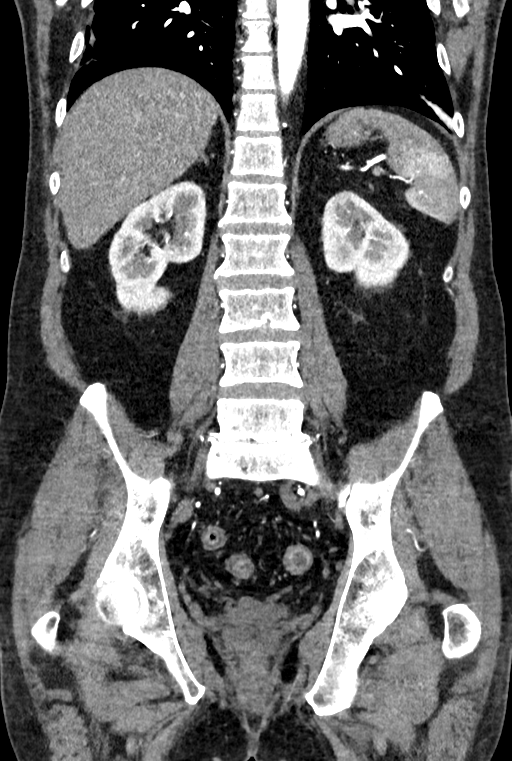

[Series 16: axial st cta venous · axial · portal-venous · 0.67mm/px · z∈[-1593,-1233]mm · 6 of 102 slices shown, 11 images]
[im 15/102  soft-tissue]
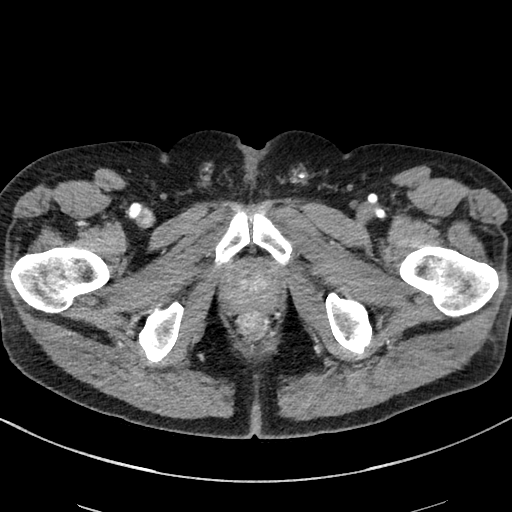
[im 15/102  bone]
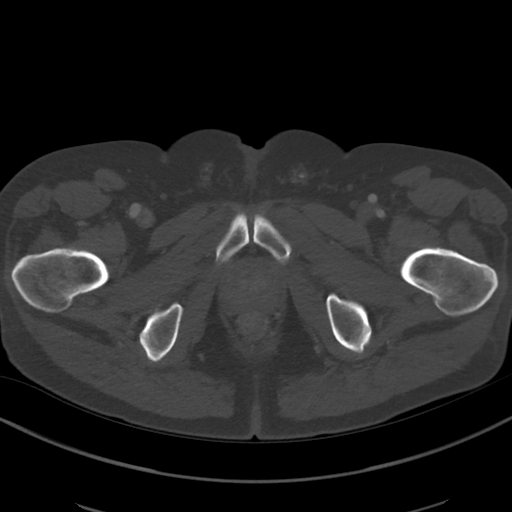
[im 29/102  soft-tissue]
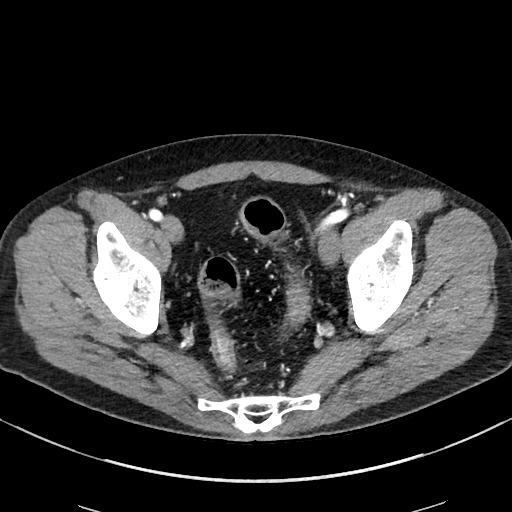
[im 44/102  soft-tissue]
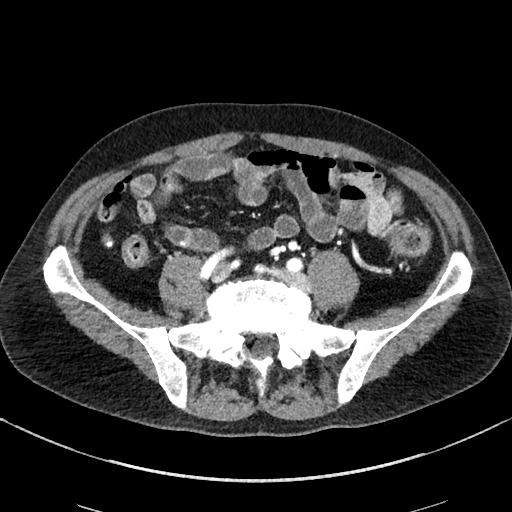
[im 44/102  lung]
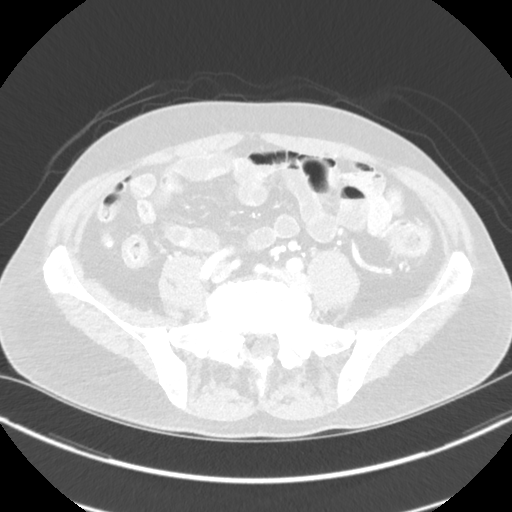
[im 58/102  soft-tissue]
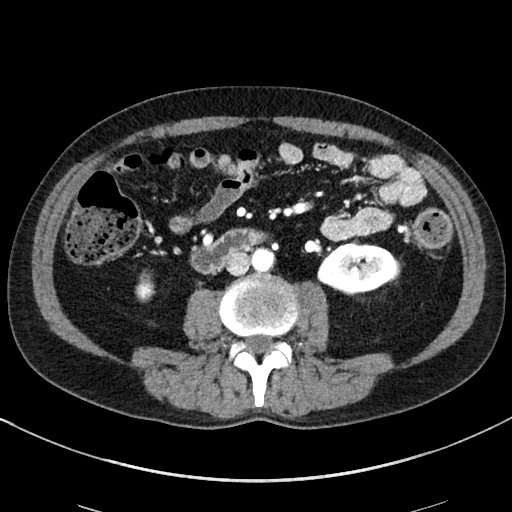
[im 58/102  lung]
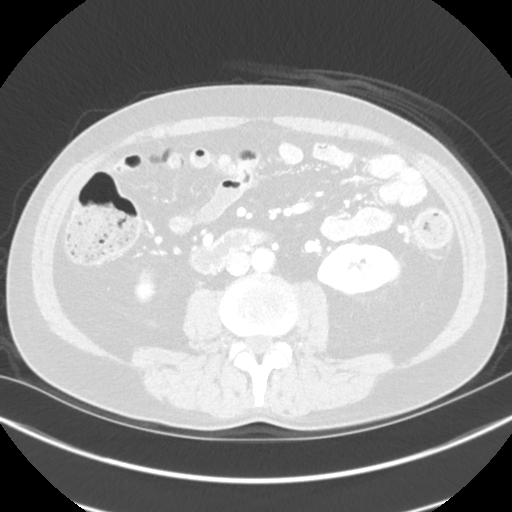
[im 73/102  soft-tissue]
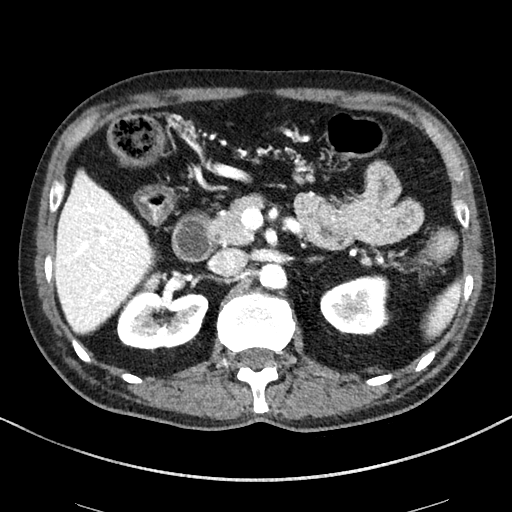
[im 73/102  lung]
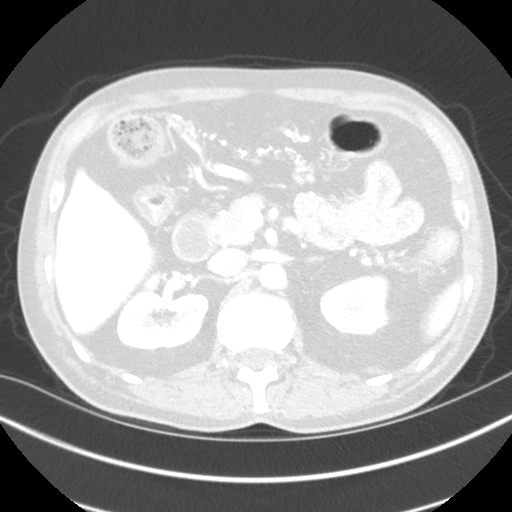
[im 87/102  soft-tissue]
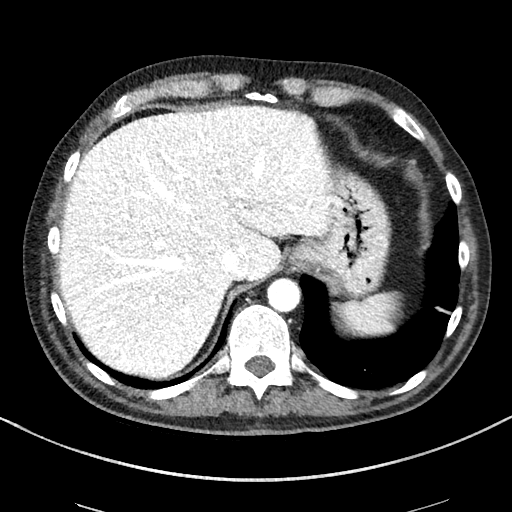
[im 87/102  lung]
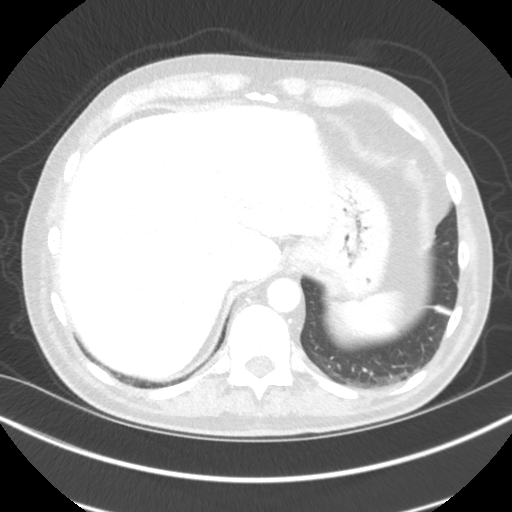

[8 of 46 positions shown; findings below may reference images not displayed]

FINDINGS: VASCULAR

Aorta: Minimal atherosclerotic changes of the infrarenal abdominal
aorta. No aneurysm. No periaortic fluid or wall thickening. No
dissection.

Celiac: Minimal atherosclerosis at the celiac artery origin without
stenosis. Typical branching to splenic artery, common hepatic
artery, left gastric artery. Branches are patent

SMA: Minimal atherosclerotic changes at the SMA origin without
stenosis. Replaced right hepatic artery from the SMA. Branches are
patent.

Renals: The main right renal artery demonstrates normal course
caliber and contour. Minimal atherosclerotic changes at the origin
without stenosis. There is accessory right renal artery originating
at the 9 o'clock position just below the main renal artery.

Mild atherosclerotic changes at the origin of the left main renal
artery without significant stenosis. Normal course caliber and
contour.

IMA: IMA is patent.

Right lower extremity:

Minimal atherosclerotic changes of the right iliac system. No
aneurysm, dissection. Mild tortuosity. Hypogastric artery is patent.

Common femoral artery patent. Proximal profunda femoris and SFA
patent.

Left lower extremity:

Minimal atherosclerotic changes of the left iliac system. Mild
tortuosity. Hypogastric arteries patent. No aneurysm or dissection.

Common femoral artery is patent. Proximal profunda femoris and SFA
are patent.

Veins: accessory left renal draining vein to the left common iliac
vein. Unremarkable IVC. Unremarkable portal system

Review of the MIP images confirms the above findings.

NON-VASCULAR

Lower chest: Mild atelectasis scarring at the lung bases. There is a
small nodule at the periphery of the left lower lobe on image 32 of
series 7 which is unchanged from the CT of 6662 and most likely
benign given the unchanged size.

Hepatobiliary: The portal venous phase demonstrates a hypodense
lesion adjacent to the gallbladder fossa in segment 4 B measuring 17
mm by 23 mm on image 21 of series 16. This was not visualized on the
comparison CT. There is no correlate on the arterial phase. No
cirrhotic changes of the liver.

Unremarkable gallbladder.  Unremarkable pancreas

Pancreas: Unremarkable spleen

Spleen: Unremarkable.

Adrenals/Urinary Tract: Unremarkable appearance of adrenal glands.

Right:

No hydronephrosis. Symmetric perfusion to the left. No
nephrolithiasis. Unremarkable course of the right ureter.

Left:

No hydronephrosis. Symmetric perfusion to the right. No
nephrolithiasis. Unremarkable course of the left ureter. Slight mild
rotation of the left kidney

Unremarkable appearance of the urinary bladder .

Stomach/Bowel: Unremarkable appearance of the stomach. Unremarkable
appearance of small bowel. No evidence of obstruction. Appendix is
normal, however with a calcified appendicoliths. Cecum and ascending
colon unremarkable. Hepatic flexure unremarkable. There is a
transition in the transverse colon in the mid segment from a more
normal appearance of the mucosa and wall proximally to
circumferential thickening and hyperenhancement of the mid segment.
There is associated narrowing of the lumen at this site which then
transitions to a more normal caliber at the splenic flexure.
Descending colon demonstrates mild mucosal enhancement with evidence
of a comb sign/hypervascularity. No narrowing of the descending
colon. Sigmoid colon demonstrates wall thickening circumferentially
and hyperenhancing mucosa with a comb sign/hypervascularity.

Lymphatic: No lymphadenopathy.

Mesenteric: No free fluid or air. No adenopathy.

Reproductive: Unremarkable appearance of the prostate. Small
calcified focus in the rectovesical space, potentially a remnant of
a prior torsed appendage/appendagitis.

Other: No hernia.

Musculoskeletal: No significant sclerotic changes of the sacroiliac
joint. No acute displaced fracture. Mild degenerative changes of the
spine. Vacuum disc phenomenon at L5-S1 where there is the most
pronounced disc space narrowing. No bony canal narrowing.
Unremarkable appearance of the bilateral hips.
IMPRESSION: Findings of acute inflammatory bowel disease, most pronounced in a
segment of transverse colon and the sigmoid colon, though also
involving the descending colon, as manifested by mucosal
hyperenhancement and comb sign/hypervascularity.

The segment of transverse colon that is pathologic on today's CT is
narrowed, and a chronic stricture at this site can not be excluded.
This finding is new from the comparison CT of 6662.

There is a hypodense lesion of segment 4B in the left liver
measuring 2.3 cm. This finding is only present on the delayed images
on today's CT, and was not present on the comparison CT. Further
evaluation with contrast-enhanced MRI is recommended, as the
differential includes both benign and malignant entities, as well as
possibly abscess.

Aortic Atherosclerosis ([id number]).

## 2020-01-03 NOTE — Unmapped (Signed)
Received call from pt wife today regarding pt stelara injections asking when he is due for his next dose as he last took it on 11/8. Spoke to Zella Ball today and informed her he is due again on 02/14/19. She verbalized understanding

## 2020-01-12 DIAGNOSIS — K5130 Ulcerative (chronic) rectosigmoiditis without complications: Principal | ICD-10-CM

## 2020-01-12 NOTE — Unmapped (Addendum)
Reason For Call:  Received call from pt wife with concerned about side effects of stelara     Assessment:   Called pt wife Zella Ball back. She did not answer. Left VM with call back number    Spoke to Mr Jayne today. He is concerned the stelara is causing joint pain. He had no issues after the stelara infusion, took his first stelara subcutaneous injection on 12/20/19 and starting about 1 week ago (about 01/05/20) he noticed worsening joint pain. He states he can hardly walk, his knees, hips are the worst. Also c/o joint pain in his wrists/hands/shoulders, but that pain is not new but has steadily worsened. He Takes tylenol for his joint pain which helps a little. His diarrhea has improved and his bleeding has stopped however he continues to take uceris foam nightly. He did not take the foam for 3 nights and his bleeding returned.    He is not taking canasa suppositories, they did not help.      Instructions Provided:  Will discuss with Dr Raphael Gibney     Follow Up:  - Patient verbalized understanding and agreement with plan       Workup Time:   15 min    01/14/20 - Discussed patient concerns with Dr Raphael Gibney, he does not think this is related to his stelara, joint pain may have been controlled with [name] previously which he is no longer taking. Ok to start prednisone 40mg  x 14 days, decrease by 10mg  every 7 days. Relayed this information to pt and schedule for follow up on 03/07/20. Ok to continue uceris foam nightly as well per dr Raphael Gibney

## 2020-01-14 MED ORDER — BUDESONIDE 2 MG/ACTUATION RECTAL FOAM
Freq: Two times a day (BID) | RECTAL | 1 refills | 0.00000 days | Status: CP
Start: 2020-01-14 — End: 2020-03-27

## 2020-01-14 MED ORDER — BUDESONIDE 2 MG/ACTUATION RECTAL FOAM: g | 1 refills | 0 days | Status: AC

## 2020-01-14 MED ORDER — PREDNISONE 10 MG TABLET
ORAL_TABLET | ORAL | 0 refills | 0.00000 days | Status: CP
Start: 2020-01-14 — End: 2020-03-14

## 2020-02-15 DIAGNOSIS — U071 COVID-19: Principal | ICD-10-CM

## 2020-02-15 NOTE — Unmapped (Signed)
COVID Infusion Therapy Questionnaire    Have you received a positive COVID test result (excluding antibody testing) AND are currently in an outpatient setting? Yes  Do you have at least one mild or moderate COVID symptom that began no more than 7 days ago? Yes  What date did you test COVID+ (mm/dd/yyyy): 02/14/2020  What date did your symptoms start (mm/dd/yyyy): 02/13/2020  Which of the following COVID symptoms are you experiencing: Fever, Muscle/body aches and Sore throat  Do you (1) have new oxygen requirements or (2) increased oxygen requirement due to COVID-19? No   Patient is under 53 years of age? No    Vaccination status:  ??? Patient has received a COVID-19 vaccine: No  ??? Manufacturer: N/A  ??? # of doses received: N/A  ??? What date did you receive your last dose (mm/dd/yyyy):     Do you have any of the following conditions?  ??? Age 14 or older AND unvaccinated: No - Continue screening for other mAb  ??? BMI 40 or over AND unvaccinated: No - Continue screening for other mAb    Regardless of vaccine status:  ??? Age 45 or older: No - Continue screening for other mAb  ??? Do you have a severe chronic lung disease that keeps you so short of breath that it is difficult for you to go places or requires you to be on oxygen: No  ??? Severely immunocompromised: Yes  o Have you had a solid organ transplant within the past 6 months?  o Have you had a bone marrow transplant within the past year?  o Have you had leukemia or lymphoma or multiple myeloma?  o Are you receiving a medication prescribed by a Rheumatologist or other specialist to control an autoimmune condition like lupus? (e.g., rituximab, ocrelizumab, ofatumumab, alemtuzumab, anifrolumab, or belimumab)  o Were you born with a severe immunocompromising condition?    Resolution  Patient qualifies - Because you meet the prioritized eligibility criteria at [facility] at this time, we can offer monoclonal antibody therapy.  The U.S[place] FDA has issued an Emergency Use Authorization to permit the emergency use of the unapproved monoclonal antibodies for the treatment of mild to moderate coronavirus disease 2019 (COVID-19).  This treatment may help to prevent progression on to severe symptoms and/or hospitalization in those who are confirmed to be high-risk COVID (+) and are within 10 days of symptom onset.  Answered all questions utilizing FAQ sheet.  Advised patient that those receiving monoclonal or plasma products should wait 90 days until getting COVID-19 immunized. At this time there are no approved and available alternative treatments for out-patients.    Patient Agreed to therapy. Scheduling notified.  Patient County: [facility]

## 2020-02-15 NOTE — Unmapped (Signed)
Reason For Call:  Received call from pt wife that Mr Eggebrecht has tested positive for COVID-19     Assessment:   Pt took his stelara 90mg  injection on 02/13/20. Per Zella Ball,  Pt started having symptoms of congestion on Saturday 02/12/20 and thinks he may have had a fever on Sunday night. He took a covid-19 test on Monday 02/15/20 and it was positive. She is calling for recommendations. Discussed monoclonial antibody infusion. She agrees and will wait for a call from infusion schedulers.     Dr. Raphael Gibney updated and referral placed.        Workup Time: 15 min

## 2020-02-16 ENCOUNTER — Encounter: Admit: 2020-02-16 | Discharge: 2020-02-17 | Payer: PRIVATE HEALTH INSURANCE

## 2020-02-16 DIAGNOSIS — U071 COVID-19: Principal | ICD-10-CM

## 2020-02-16 DIAGNOSIS — Z451 Encounter for adjustment and management of infusion pump: Principal | ICD-10-CM

## 2020-02-16 MED ADMIN — sotrovimab 500 mg in sodium chloride (NS) 0.9 % 100 mL IVPB PREMADE: 500 mg | INTRAVENOUS | @ 15:00:00 | Stop: 2020-02-16

## 2020-02-16 NOTE — Unmapped (Signed)
Post Infusion Contacts    After your Regeneron therapy, you can contact your primary care provider or our Clinical Contact Center [phone] for minor symptoms.     For severe symptoms, please dial 911.     Those receiving monoclonal or plasma products should wait 90 days until getting immunized.     Thank you for choosing [facility] at [facility]        FACT SHEET FOR PATIENTS, PARENTS, AND CAREGIVERS   Emergency Use Authorization (EUA) of Sotrovimab for the Treatment of Coronavirus Disease 2019 (COVID-19)     You are being given a medicine called sotrovimab for the treatment of coronavirus disease 2019 (COVID-19). This Fact Sheet contains information to help you understand the potential risks and potential benefits of taking sotrovimab, which you may receive.   Receiving sotrovimab may benefit certain people with COVID-19.   Read this Fact Sheet for information about sotrovimab. Talk to your healthcare provider if you have any questions. It is your choice to receive sotrovimab or stop it at any time.     What is COVID-19?   COVID-19 is caused by a virus called a coronavirus. People can get COVID-19 through contact with another person who has the virus.   COVID-19 illnesses have ranged from very mild (including some with no reported symptoms) to severe, including illness resulting in death. While information so far suggests that most COVID-19 illness is mild, serious illness can happen and may cause other medical conditions to become worse. People of all ages with severe, long-lasting (chronic) medical conditions like heart disease, lung disease, diabetes, for example, and other conditions including obesity, seem to be at higher risk of being hospitalized for COVID-19. Older age, with or without other conditions, also places people at higher risk of being hospitalized for COVID-19.     What are the symptoms of COVID-19?   The symptoms of COVID-19 are fever, cough, and shortness of breath, which may appear 2 to 14 days after exposure. Serious illness, including breathing problems, can occur and may cause your other medical conditions to become worse.     What is sotrovimab?   Sotrovimab is an investigational medicine used to treat mild-to-moderate symptoms of COVID-19 in adults and children (69 years of age and older weighing at least 88 pounds [40 kg]) with positive results of direct SARS-CoV-2 viral testing, and who are at high risk of progression to severe COVID-19, including hospitalization or death. Sotrovimab is investigational because it is still being studied. There is limited information about the safety and effectiveness of using sotrovimab to treat people with mild-to-moderate COVID-19.   The U.S. Food & Drug Administration (FDA) has authorized the emergency use of sotrovimab for the treatment of COVID-19 under an Emergency Use Authorization (EUA). For more information on EUA, see the ???What is an Emergency Use Authorization (EUA)???? section at the end of this Fact Sheet.     Who should not receive sotrovimab?   Do not take sotrovimab if you have had a serious allergic reaction to sotrovimab or to any of the ingredients in sotrovimab.     What are the ingredients in sotrovimab?   Active ingredient: sotrovimab   Inactive ingredients: L-histidine, L-histidine monohydrochloride, L-methionine, polysorbate 80, and sucrose     What should I tell my healthcare provider before I receive sotrovimab?   Tell your healthcare provider about all of your medical conditions, including if you:   ??? Have any allergies   ??? Have had a  serious allergic reaction to sotrovimab or to any of the ingredients in sotrovimab   ??? Are pregnant or plan to become pregnant   Are breastfeeding or plan to breastfeed   ??? Have any serious illnesses   ??? Are taking any medicines (prescription, over-the-counter, vitamins, or herbal products)     How will I receive sotrovimab?   ??? You will receive 1 dose of sotrovimab.   ??? Sotrovimab will be given to you through a vein (intravenous or IV infusion) over 30 minutes.   ??? You will be observed by your healthcare provider for at least 1 hour after you receive sotrovimab.     What are the important possible side effects of sotrovimab?   Possible side effects of sotrovimab are:     ??? Allergic reactions. Allergic reactions can happen during and after infusion with sotrovimab. Tell your healthcare provider right away if you get any of the following signs and symptoms of allergic reactions: fever; difficulty breathing; low oxygen level in your blood; chills; tiredness; fast or slow heart rate; chest discomfort or pain; weakness; confusion; nausea; headache; shortness of breath; low or high blood pressure; wheezing; swelling of your lips, face, or throat; rash including hives; itching; muscle aches; dizziness; feeling faint; and sweating.     The side effects of getting any medicine through a vein may include brief pain, bleeding, bruising of the skin, soreness, swelling, and possible infection at the infusion site.   These are not all the possible side effects of sotrovimab. Not many people have been given sotrovimab. Serious and unexpected side effects may happen. Sotrovimab is still being studied, so it is possible that all of the risks are not known at this time.   It is possible that sotrovimab could interfere with your body???s own ability to fight off a future infection of SARS-CoV-2. Similarly, sotrovimab may reduce your body???s immune response to a vaccine for SARS-CoV-2. Specific studies have not been conducted to address these possible risks. Talk to your healthcare provider if you have any questions.     What other treatment choices are there?   Like sotrovimab, FDA may allow for the emergency use of other medicines to treat people with COVID-19. Go to https://garcia.com/ for information on the emergency use of other medicines that are not approved by FDA to treat people with COVID-19. Your healthcare provider may talk with you about clinical trials for which you may be eligible.   It is your choice to be treated or not to be treated with sotrovimab. Should you decide not to receive sotrovimab, or stop it at any time, it will not change your standard medical care.     What if I am pregnant or breastfeeding?   There is no experience treating pregnant women or breastfeeding mothers with sotrovimab. For a mother and unborn baby, the benefit of receiving sotrovimab may be greater than the risk from the treatment. If you are pregnant or breastfeeding, discuss your options and specific situation with your healthcare provider.     How do I report side effects with sotrovimab?   Tell your healthcare provider right away if you have any side effects that bother you or do not go away.   Report side effects to FDA MedWatch at MacRetreat.be or call 1-800-FDA-1088, or call the [facility] COVID Contact Center at 1-866-GSK-COVID (519) 104-5337).     How can I learn more?   ??? Ask your healthcare provider   ??? Visit www.sotrovimabinfo.com   ??? Call the  [facility] COVID Contact Center at 1-866-GSK-COVID 9011575134)   ???StockBudget.de???[email or web address].no  ???Contact your local or state public health department    What is an Emergency Use Authorization (EUA)?  The FDA has made sotrovimab available under an emergency access mechanism called an EUA. The EUA is supported by a Surveyor, minerals and Human Service (HHS)declaration that circumstances exist to justify the emergency use of drugs and biological products during the COVID-19 pandemic.  Sotrovimab has not undergone the same type of review as an FDA-approved medicine. In issuing an EUA under the COVID-19 public health emergency, the FDA must determine, among other things, that based on the totality of scientific evidence available, itis reasonable to believe that the product may be effective for diagnosing, treating, or preventing COVID-19, or a serious or life-threatening disease or condition caused by COVID-19; that the known and potential benefits of the product, when used to diagnose, treat, or prevent such disease or condition, outweigh the known and potential risks of such product; and that there are no adequate, approved and available alternatives. All of these criteria must be met to allow for the medicine to be used in the treatment of patients during the COVID-19 pandemic.  The EUA for sotrovimab is in effect for the duration of the COVID-19 declaration justifying emergency use of these medicines, unless terminated or revoked (after which the products may no longer be used).

## 2020-02-16 NOTE — Unmapped (Signed)
0930 Pt arrived for Sotrovimab for COVID.   ??  1015 Sotrovimab 500 mg IV infusing over 30 minutes via PIV  ??  1115 1 hr monitoring completed.Flushed with NS. PIV removed. Reviewed AVS with pt. Reverbalized understanding.  Dc'd from Infusion Center.

## 2020-02-16 NOTE — Unmapped (Signed)
0935 Pt arrived for Sotrovimmab for COVID.      0945 Sotrovimab 1200 mg IV infusing over 21 minutes via PIV.    1015 Sotrovimab infusion completed.     1115 1 hr monitoring completed.Flushed with NS. PIV removed. Reviewed AVS with pt. Reverbalized understanding.  Dc'd from Infusion Center.

## 2020-03-03 NOTE — Unmapped (Signed)
[facility]   FOLLOW UP NOTE - INFLAMMATORY BOWEL DISEASE  03/07/2020    Demographics:  Kevin Tran is a 60 y.o. year old male    Diagnosis:  Ulcerative Colitis  Disease onset (yr):  2018  Age at onset:  > 75yr old (A3)  Location:  Left-sided (E2)  Behavior:  Colitis  Current Tight Control Scenario:   Maintenance = Biologic/small molecule          HPI / NOTE :     VIDEO VISIT DUE TO COVID19    Interval Events:   1.  Last seen by me 09/2019 - severe colitis flare symptoms and also arthralgias (suspect extraintestinal)  2.  Colonoscopy 10/2019 - moderate proctitis up to 10-20cm  3.  After the colonoscopy we started BID canasa + Stelara    HPI:   Felt Stelara wasn't working initially - he had to use the [employer] foam.  Gradually bowel habits did improve. BMs are still 5x/day, does have urgency.  Stools are more formed now, which is an improvement. No blood in stool. He stopped UCeris 5-6 weeks ago.     He feels Stelara is better because stools are more formed, appetite is better, rectal bleeding is improved.  However still 5BMs/day.     He and his wife both had COVID infection - Feb 15, 2020. Getting better now.     Major symptom now is joint pain - got really bad in early December.  We prescribed prednisone which helped a lot. Joint pain is worse now off prednisone. He has joint pain all over - hips, shoulders, hands. Joint pain is worse in the morning.     Abdominal pain (0-10): moderate  BM a day: 5-6x/day (baseline 2x/day), sometimes bad spells with intense diarrhea (>>10x/day)  Consistency: loose  % of stools have blood: 0%  Nocturnal BM: no  Urgency: yes+  Weight change over last 6 mo: weight stable  Smoking: no  NSAIDS: avoids    Review of Systems:   Review of systems positive for: negative except as above.   Otherwise, the balance of 10 systems is negative.           IBD HISTORY:     Year of disease onset:  2018    Brief IBD Disease Course:  In late 2018 developed bloody diarrhea and incontinence. Colonoscopy in Feb 2019 showed edema and erythema in the rectum and sigmoid with decreased vascularity in the rest of the colon; the TI appeared normal. He was treated with courses of prednisone for much of 2019 and also put on mesalamine without improvement. In Fall 2019 he was started on Humira w/o improvement. In Dec 2019 he had moderate level antibodies to humira; was added; mesalamine was stopped; put on prednisone taper; rapidly improved  - Jan 2020 - stopped mesalamine, used weekly Humira + with initial improvement.   - April 2020 - notable worsening of diarrhea and abdominal pain  Colonoscopy with moderate inflammation mainly in rectum.  Changed to [name] 06/2018.  Had limited response initially, but improved with Uceris foam 09/2018.    - 09/2018 - taper to xeljanz 5mg  BID.  Nov 2020 - ongoing proctitis symptoms, restarted Uceris foam.   - 06/2019 - severe UC flare on [name], plan to restage disease and change therapy  - 10/2019 - moderate proctitis (up to ~20cm), started canasa + Stelara  - Jan 2022 - adding Methotrexate for joint pain    Endoscopy:      -  Colonoscopy Feb 2019: edema + erythema in rectum and sigmoid; decreased vascularity in rest of colon  - Dec 2019: moderate inflammation throughout the colon  - Colonoscopy 07/01/2018 - poor prep, moderate proctitis up to 15cm, mild patchy nodularity in remaining colon.  Normal terminal ileum.   - Colonoscopy 10/20/2019 - hemorrhoids.  Moderate (Mayo 2) colitis from anal verge up to 10cm.  Mild colitis from 10-20cm, then normal colon.  Normal terminal ileum.    PATH:  Moderately active chronic colitis    Imaging:    - CT 01/28/18: left sided colitis; liver lesion (see below)  - MRI abdomen to evaluate liver lesion 02/09/18 - aforementioned liver lesion felt to be focal fatty infiltration;  6 month repeat recommended    Prior IBD medications (type, dose, duration, response):  [x]  5-ASAs - Mesalamine  - no improvement; possible paradoxical diarrhea  [x]  Oral corticosteroids - prednisone.  Good response with budesonide foam.   []  Intravenous corticosteroids  []  Antibiotics  [x]  Thiopurines: started in Dec 2019  []  Methotrexate  [x]  Anti-TNF therapies - Humira since Sept or Oct 2019. Low level and low titer Ab in Dec 2019. Increased to weekly dosing. Stopped spring 2020 due to non-response despite good drug levels.   []  Anti-Integrin therapies  []  Anti-Interleukin therapies  [x]  Anti-JAK therapies - Xeljanz 10mg  started 06/2018, tapered to 5mg  bid 09/2018  []  Cyclosporine  []  Clinical trial medication  []  Other (Please specify):    Extraintestinal manifestations:   -joint pains affecting: y, peripheral arthralgias  -eye: n  -skin: n  -oral ulcers :  n  -blood clots: n  -PSC: n  -other: n          Past Medical History:   Past medical history:   Past Medical History:   Diagnosis Date   ??? Arthritis    ??? Pneumothorax    ??? Ulcerative colitis (CMS-HCC)      Past surgical history:   Past Surgical History:   Procedure Laterality Date   ??? BACK SURGERY  1996   ??? COLONOSCOPY     ??? PR COLONOSCOPY W/BIOPSY SINGLE/MULTIPLE Left 07/01/2018    Procedure: COLONOSCOPY, FLEXIBLE, PROXIMAL TO SPLENIC FLEXURE; WITH BIOPSY, SINGLE OR MULTIPLE;  Surgeon: [name], MD;  Location: [facility];  Service: Gastroenterology   ??? PR COLONOSCOPY W/BIOPSY SINGLE/MULTIPLE  10/20/2019    Procedure: COLONOSCOPY, FLEXIBLE, PROXIMAL TO SPLENIC FLEXURE; WITH BIOPSY, SINGLE OR MULTIPLE;  Surgeon: Luanne Bras, MD;  Location: [facility];  Service: Gastroenterology     Family history:   Family History   Problem Relation Age of Onset   ??? Cancer Mother      Social history:   Social History     Socioeconomic History   ??? Marital status: Married     Spouse name: Not on file   ??? Number of children: Not on file   ??? Years of education: Not on file   ??? Highest education level: Not on file   Occupational History   ??? Not on file   Tobacco Use   ??? Smoking status: Former Smoker     Types: Cigarettes   ??? Smokeless tobacco: Never Used   Vaping Use   ??? Vaping Use: Never used   Substance and Sexual Activity   ??? Alcohol use: Yes     Alcohol/week: 2.0 standard drinks     Types: 2 Cans of beer per week   ??? Drug use: No   ???  Sexual activity: Not on file   Other Topics Concern   ??? Not on file   Social History Narrative   ??? Not on file     Social Determinants of Health     Financial Resource Strain: Not on file   Food Insecurity: Not on file   Transportation Needs: Not on file   Physical Activity: Not on file   Stress: Not on file   Social Connections: Not on file             Allergies:   No Known Allergies          Medications:     Current Outpatient Medications   Medication Sig Dispense Refill   ??? acetaminophen (TYLENOL) 500 MG tablet Take 500 mg by mouth every six (6) hours as needed.      ??? budesonide 2 mg/actuation Foam Insert 1 Dose into the rectum two (2) times a day. 1 metered dose rectally twice daily x 14 days, then once daily x 28 days. 66.8 g 1   ??? budesonide 2 mg/actuation Foam 1 metered dose rectally nightly 66.8 g 1   ??? fluticasone propionate (FLONASE) 50 mcg/actuation nasal spray USE 2 SPRAY IN EACH NOSTRIL ONCE DAILY FOR ALLERGIES (Patient not taking: Reported on 10/20/2019)     ??? folic acid (FOLVITE) 1 MG tablet Take 1 tablet (1 mg total) by mouth daily. 30 tablet 11   ??? gabapentin (NEURONTIN) 300 MG capsule Take 1 capsule (300 mg total) by mouth nightly. Take 2-3 tabs PO qhs for pain 90 capsule 2   ??? mesalamine (CANASA) 1000 MG suppository Insert 1 suppository (1,000 mg total) into the rectum Two (2) times a day. 30 suppository 2   ??? methotrexate (PF) 25 mg/mL 15 mg injection Inject 0.6 mL (15 mg total) under the skin once a week. 4 mL 0   ??? multivitamin capsule Take 1 capsule by mouth daily.      ??? omeprazole (PRILOSEC) 20 MG capsule Take 20 mg by mouth daily.      ??? ondansetron (ZOFRAN-ODT) 4 MG disintegrating tablet Take 1 tablet (4 mg total) by mouth every eight (8) hours as needed for nausea for up to 7 days. 21 tablet 0   ??? predniSONE (DELTASONE) 10 MG tablet 40mg  x 14 days, decrease by 10mg  every 7 days 98 tablet 0   ??? pregabalin (LYRICA) 225 MG capsule TK ONE C PO  BID (Patient not taking: Reported on 10/20/2019)     ??? ustekinumab (STELARA) 90 mg/mL Syrg syringe Inject 1 mL (90 mg total) under the skin every 8 weeks. 1 mL 5     No current facility-administered medications for this visit.             Physical Exam:   There were no vitals taken for this visit.    VIDEO VISIT - no vitals and limited physical exam    GEN: elderly male in no apparent distress, appears comfortable on exam  HEENT: eyes normal and symmetric  NEURO:  Normal speech, face symmetric  PULM:  Respirations comfortable  SKIN:  No visible rash on face/neck  Psych: affect appropriate, A&O x3          Labs, Data & Indices:     Lab Review:   Lab Results   Component Value Date    WBC 4.9 08/04/2019    RBC 5.34 08/04/2019    HGB 14.7 08/04/2019     Lab Results   Component Value Date  AST 19 08/04/2019    ALT 20 08/04/2019    BUN 14 08/04/2019    Creatinine 0.99 08/04/2019    CO2 24 08/04/2019    Albumin 4.1 03/03/2018    Calcium 10.0 08/04/2019     No results found for: TSH   ...............................................................................................................................................Marland Kitchen  Modified Mayo Score for Ulcerative Colitis  Stool Frequency:  1 = 1-2 more than normal  Rectal bleeding:   0 = None  Physicians Global Assessment:  1 = Mild colitis  Score: 2  Remission <2  ............................................................................................................................................  ORDERS THIS VISIT:       Diagnosis ICD-10-CM Associated Orders   1. 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 Arthralgia, unspecified joint  M25.50    3. High risk medication use  Z79.899            Assessment & Recommendations:   Disease state:    Kevin Tran is a 59 y.o. male with ulcerative pancolitis (mostly proctosigmoiditis) since 2018. He was started on Humira in 2019. By April 2020, he had a mechanistic non-response to Humira (good drug level and moderate to severe proctitis).  We changed to [name] in May 2020 with only partial response and then a fairly severe flare summer 2021. Colonoscopy 10/2019 showed moderate proctitis.  We started BID Canasa + Stelara in Sept 2021. He has had partial improvement on stelara (more formed stools, resolution of bleeding, improved appetite).  However, he has significant diffuse polyarthralgia which sounds like an extraintestinal IBD manifestation.  We will continue Stelara and add Methotrexate 15mg /week to see if this helps his joint pain.      PLAN:  1.  Continue Stelara injection every 8 weeks  2.  Please start Methotrexate 15mg  (0.38mL) ONCE PER WEEK. This is a subcutaneous injection once weekly.   3.  On the day of your methotrexate injection, take a Zofran anti-nausea tablet after dinner.  Do you methotrexate injection just before bedtime. Take a Zofran tablet when you wake up (you can take additional zofran every 4 hours if needed for nausea.   4.  Please take folic acid 1mg  daily  5.  You will need to do lab work at [facility] at the following schedule:   Lab work in 2 weeks, then 4 weeks, then 8 weeks, then every 12 weeks.  We will not refill the methotrexate until we see your lab results.   6.  Follow up with me in 3-4 months.  Please call [phone] to schedule an appointment.     IBD health maintenance:  Influenza vaccine: given in Fall 2019  Pneumonia vaccine:   Hepatitis B: received twinrix 2 of 3;   TB testing: Quant gold neg in March 2019  Chickenpox/Shingles history: Shingrix completed 09/2018  Bone denistometry:   Derm appointment:  Last colonoscopy: 10/2019  --------------------------------------------------------  Author: [name] 03/07/2020 5:19 PM    [name], MD  Assistant Professor of Medicine  Division of Gastroenterology & Hepatology  [facility]  =====================================================      The patient reports they are currently: at home. I spent 21 minutes on the real-time audio and video visit with the patient on the date of service. I spent an additional 10 minutes on pre- and post-visit activities  on the date of service.     The patient was not located and I was located within 250 yards of a hospital based location during the real-time audio and video visit. The patient was physically located in [place] or a state in which I am permitted to provide care. The patient and/or parent/guardian understood that s/he may incur co-pays and cost sharing, and agreed to the telemedicine visit. The visit was reasonable and appropriate under the circumstances given the patient's presentation at the time.    The patient and/or parent/guardian has been advised of the potential risks and limitations of this mode of treatment (including, but not limited to, the absence of in-person examination) and has agreed to be treated using telemedicine. The patient's/patient's family's questions regarding telemedicine have been answered.    If the visit was completed in an ambulatory setting, the patient and/or parent/guardian has also been advised to contact their provider???s office for worsening conditions, and seek emergency medical treatment and/or call 911 if the patient deems either necessary.

## 2020-03-07 ENCOUNTER — Encounter: Admit: 2020-03-07 | Discharge: 2020-03-08 | Payer: PRIVATE HEALTH INSURANCE

## 2020-03-07 DIAGNOSIS — K5130 Ulcerative (chronic) rectosigmoiditis without complications: Principal | ICD-10-CM

## 2020-03-07 DIAGNOSIS — Z79899 Other long term (current) drug therapy: Principal | ICD-10-CM

## 2020-03-07 DIAGNOSIS — M2550 Pain in unspecified joint: Principal | ICD-10-CM

## 2020-03-07 MED ORDER — ONDANSETRON 4 MG DISINTEGRATING TABLET
ORAL_TABLET | Freq: Three times a day (TID) | ORAL | 0 refills | 7.00000 days | Status: CP | PRN
Start: 2020-03-07 — End: 2020-03-29

## 2020-03-07 MED ORDER — METHOTREXATE INJECTION (PF) 25 MG/ML (NON-ONCOLOGY)
SUBCUTANEOUS | 0 refills | 42.00000 days | Status: CP
Start: 2020-03-07 — End: 2020-04-06

## 2020-03-07 MED ORDER — FOLIC ACID 1 MG TABLET
ORAL_TABLET | Freq: Every day | ORAL | 11 refills | 30.00000 days | Status: SS
Start: 2020-03-07 — End: 2021-03-07

## 2020-03-07 MED ORDER — METHOTREXATE SODIUM 25 MG/ML INJECTION SOLUTION
Freq: Once | INTRAVENOUS | 0 refills | 0 days | Status: CN
Start: 2020-03-07 — End: 2020-03-07

## 2020-03-07 NOTE — Unmapped (Addendum)
Franchot Erichsen it was a pleasure seeing you today.  Here is a summary/wrap up from today's visit:     1.  Continue Stelara injection every 8 weeks  2.  Please start Methotrexate 15mg  (0.41mL) ONCE PER WEEK. This is a subcutaneous injection once weekly.   3.  On the day of your methotrexate injection, take a Zofran anti-nausea tablet after dinner.  Do you methotrexate injection just before bedtime. Take a Zofran tablet when you wake up (you can take additional zofran every 4 hours if needed for nausea.   4.  Please take folic acid 1mg  daily  5.  You will need to do lab work at [facility] at the following schedule:   Lab work in 2 weeks, then 4 weeks, then 8 weeks, then every 12 weeks.  We will not refill the methotrexate until we see your lab results.   6.  Follow up with me in 3-4 months.  Please call [phone] to schedule an appointment.     [name], MD  Assistant Professor of Medicine  Division of Gastroenterology & Hepatology  [place] [facility] [place] [facility]            EXPECTATIONS FOR PATIENT FOLLOW UP AND COMMUNICATION:  -- Follow up Appointments:  Crohn's disease and Ulcerative colitis are serious chronic inflammatory diseases which require close monitoring.  I typically expect to see patients for follow up visits at least every 6 months (or more often if you are having a flare, starting new therapy, etc).  In select cases, patients who are only on aminosalicylates / mesalamine, may be seen once per year for follow ups.      -- Communication:  if you have any questions or concerns, you can communicate with [name] via phone (contact information below) or via myChart.  Note that phone messages are given higher priority and triaged first.  We typically respond to myChart messages within 3 business days (occasionally longer during holidays or vacation times).  For urgent issues, please contact us via phone.      IBD NURSE COORDINATOR CONTACT ??? Neta Mends, RN BSN     Phone: [phone] (direct line)      Fax: [phone]  * For urgent medical concerns after hours or on weekends and holidays, call [phone] and ask to speak to the GI Fellow on call.    * If you have a GI medical question or GI symptoms and would like to speak to your provider's healthcare team, please contact Neta Mends, RN (contact information above) OR you can send the GI healthcare team a message through MyChart at [email or web address]    APPOINTMENT SCHEDULING FOR GI CLINIC AND GI PROCEDURES:  RADIOLOGY - to schedule imaging ordered, please call [phone] opt 1   GI MEDICINE CLINIC  [phone] option 1   GI PROCEDURES         [phone] option 2   *To schedule, reschedule, or cancel your GI appointment, please call [phone]. If you are unable to come to an appointment, please notify us as soon as possible, preferably 24 hours in advance. Doing so may allow other patients with urgent needs to be scheduled in a cancelled appointment slot.     TEST RESULTS   If you have a MyChart account, your new results and a provider message will be sent to you through your MyChart account at [email or web address]. For results that require follow-up, a member of your healthcare team will also contact you directly.  PRESCRIPTION REFILL REQUESTS  To request prescription refills, please contact your pharmacy or send your healthcare team a message through your MyChart account at [email or web address]  RECORD REQUESTS  For questions related to medical records, please call Medical Records Release of Information at [phone]  FINANCIAL COUNSELOR   For billing and other financial questions/needs ??? please contact Mee Hives at [phone]. If you need to leave a message, please be sure to leave your full name, date of birth or MR#, best call back # and reason for call.    For educational material and resources:  [email or web address]  [place] COVID19 Vaccine Information: [email or web address]  ================================================================

## 2020-03-09 NOTE — Unmapped (Signed)
Reason For Call:  Pt wife Zella Ball called stating Javone does not want to start methotrexate, request to talk to Dr Raphael Gibney     Assessment:   Spoke to Ms Tipps, she states Tommey is concerned about starting methotrexate, he did some research and is concerned about the additional immunosuppression and side effects. He spoke with friends who have taken this med and have not had a good experience.        Follow Up:  - msg sent to Dr Raphael Gibney to call pt and discuss.   - informed Zella Ball I would ask Dr Raphael Gibney to call them.       Workup Time:   10 min

## 2020-03-14 MED ORDER — PREDNISONE 10 MG TABLET
ORAL_TABLET | ORAL | 0 refills | 29.00000 days | Status: CP
Start: 2020-03-14 — End: 2020-04-12

## 2020-03-14 MED ORDER — SULFASALAZINE 500 MG TABLET
ORAL_TABLET | Freq: Four times a day (QID) | ORAL | 1 refills | 30.00000 days | Status: SS
Start: 2020-03-14 — End: 2021-03-14

## 2020-03-14 NOTE — Unmapped (Addendum)
Reason For Call:  Received call from pt wife Zella Ball today regarding a medication Dr Raphael Gibney discussed to replace methotrexate as patient is concerned about side effects associated with mtx.      Assessment:   Spoke to Zella Ball today, Dr Raphael Gibney has sent in sulfasalazine to their local pharmacy. She states Saban is in severe pain and is having difficulty using his arms. She is having to help dress him in the morning. He has seen a rheumatologist in the past locally however if Dr Raphael Gibney recommends a rheumatology referral, he would prefer to come to [facility].   She also requests pain medication for [facility]. Will discuss with Dr Raphael Gibney     Follow Up:  - Patient wife verbalized understanding and agreement with plan      Per Dr Raphael Gibney, he will not prescribe pain medication but he will provide another short course of prednisone which helped Mr Witt in December. He is also happy to put in a referral to rheumatology if the patient would like, but doesn't feel strongly that he needs to see rheumatology at this time.        Called pt to discuss but he did not answer, left short vm and sent detailed my chart msg   Workup Time:   10 min

## 2020-03-20 DIAGNOSIS — K5130 Ulcerative (chronic) rectosigmoiditis without complications: Principal | ICD-10-CM

## 2020-03-26 DIAGNOSIS — Z20822 Contact with and (suspected) exposure to covid-19: Principal | ICD-10-CM

## 2020-03-26 DIAGNOSIS — Z87891 Personal history of nicotine dependence: Principal | ICD-10-CM

## 2020-03-26 DIAGNOSIS — K51919 Ulcerative colitis, unspecified with unspecified complications: Principal | ICD-10-CM

## 2020-03-26 DIAGNOSIS — Z7952 Long term (current) use of systemic steroids: Principal | ICD-10-CM

## 2020-03-26 DIAGNOSIS — M549 Dorsalgia, unspecified: Principal | ICD-10-CM

## 2020-03-26 DIAGNOSIS — R1319 Other dysphagia: Principal | ICD-10-CM

## 2020-03-26 DIAGNOSIS — R109 Unspecified abdominal pain: Principal | ICD-10-CM

## 2020-03-26 DIAGNOSIS — M069 Rheumatoid arthritis, unspecified: Principal | ICD-10-CM

## 2020-03-26 DIAGNOSIS — K51018 Ulcerative (chronic) pancolitis with other complication: Principal | ICD-10-CM

## 2020-03-26 DIAGNOSIS — K219 Gastro-esophageal reflux disease without esophagitis: Principal | ICD-10-CM

## 2020-03-26 DIAGNOSIS — M4802 Spinal stenosis, cervical region: Principal | ICD-10-CM

## 2020-03-26 DIAGNOSIS — Z79899 Other long term (current) drug therapy: Principal | ICD-10-CM

## 2020-03-26 LAB — URINALYSIS WITH CULTURE REFLEX
BACTERIA: NONE SEEN /HPF
BILIRUBIN UA: NEGATIVE
BLOOD UA: NEGATIVE
GLUCOSE UA: NEGATIVE
KETONES UA: NEGATIVE
LEUKOCYTE ESTERASE UA: NEGATIVE
NITRITE UA: NEGATIVE
PH UA: 7 (ref 5.0–9.0)
PROTEIN UA: NEGATIVE
RBC UA: <1 /HPF (ref <3)
SPECIFIC GRAVITY UA: 1.02 (ref 1.005–1.040)
SQUAMOUS EPITHELIAL: <1 /HPF (ref 0–5)
UROBILINOGEN UA: 0.2
WBC UA: <1 /HPF (ref <2)

## 2020-03-26 LAB — CBC W/ AUTO DIFF
BASOPHILS ABSOLUTE COUNT: 0 10*9/L (ref 0.0–0.1)
BASOPHILS RELATIVE PERCENT: 0.4 %
EOSINOPHILS ABSOLUTE COUNT: 0.2 10*9/L (ref 0.0–0.7)
EOSINOPHILS RELATIVE PERCENT: 1.7 %
HEMATOCRIT: 41.2 % (ref 38.0–50.0)
HEMOGLOBIN: 13.5 g/dL (ref 13.5–17.5)
LYMPHOCYTES ABSOLUTE COUNT: 1.2 10*9/L (ref 0.7–4.0)
LYMPHOCYTES RELATIVE PERCENT: 9.1 %
MEAN CORPUSCULAR HEMOGLOBIN CONC: 32.6 g/dL (ref 30.0–36.0)
MEAN CORPUSCULAR HEMOGLOBIN: 26 pg (ref 26.0–34.0)
MEAN CORPUSCULAR VOLUME: 79.8 fL — ABNORMAL LOW (ref 81.0–95.0)
MEAN PLATELET VOLUME: 8 fL (ref 7.0–10.0)
MONOCYTES ABSOLUTE COUNT: 0.9 10*9/L (ref 0.1–1.0)
MONOCYTES RELATIVE PERCENT: 6.9 %
NEUTROPHILS ABSOLUTE COUNT: 10.7 10*9/L — ABNORMAL HIGH (ref 1.7–7.7)
NEUTROPHILS RELATIVE PERCENT: 81.9 %
NUCLEATED RED BLOOD CELLS: 0 /100{WBCs} (ref <=4)
PLATELET COUNT: 318 10*9/L (ref 150–450)
RED BLOOD CELL COUNT: 5.17 10*12/L (ref 4.32–5.72)
RED CELL DISTRIBUTION WIDTH: 14.7 % (ref 12.0–15.0)
WBC ADJUSTED: 13 10*9/L — ABNORMAL HIGH (ref 3.5–10.5)

## 2020-03-26 LAB — COMPREHENSIVE METABOLIC PANEL
ALBUMIN: 3.6 g/dL (ref 3.4–5.0)
ALKALINE PHOSPHATASE: 85 U/L (ref 46–116)
ALT (SGPT): 15 U/L (ref 10–49)
ANION GAP: 7 mmol/L (ref 5–14)
AST (SGOT): 14 U/L (ref <=34)
BILIRUBIN TOTAL: 0.3 mg/dL (ref 0.3–1.2)
BLOOD UREA NITROGEN: 15 mg/dL (ref 9–23)
BUN / CREAT RATIO: 16
CALCIUM: 9.5 mg/dL (ref 8.7–10.4)
CHLORIDE: 102 mmol/L (ref 98–107)
CO2: 28.5 mmol/L (ref 20.0–31.0)
CREATININE: 0.91 mg/dL
EGFR CKD-EPI AA MALE: >90 mL/min/{1.73_m2} (ref >=60)
EGFR CKD-EPI NON-AA MALE: >90 mL/min/{1.73_m2} (ref >=60)
GLUCOSE RANDOM: 132 mg/dL (ref 70–179)
POTASSIUM: 4.4 mmol/L (ref 3.4–4.5)
PROTEIN TOTAL: 7.3 g/dL (ref 5.7–8.2)
SODIUM: 137 mmol/L (ref 135–145)

## 2020-03-26 LAB — LIPASE: LIPASE: 47 U/L (ref 12–53)

## 2020-03-27 ENCOUNTER — Encounter
Admit: 2020-03-27 | Discharge: 2020-03-29 | Disposition: A | Discharge status: Home / Self Care | Payer: PRIVATE HEALTH INSURANCE | Admitting: Student in an Organized Health Care Education/Training Program

## 2020-03-27 ENCOUNTER — Ambulatory Visit
Admit: 2020-03-27 | Discharge: 2020-03-29 | Disposition: A | Discharge status: Home / Self Care | Payer: PRIVATE HEALTH INSURANCE | Admitting: Student in an Organized Health Care Education/Training Program

## 2020-03-27 DIAGNOSIS — R1319 Other dysphagia: Principal | ICD-10-CM

## 2020-03-27 DIAGNOSIS — M549 Dorsalgia, unspecified: Principal | ICD-10-CM

## 2020-03-27 DIAGNOSIS — K51018 Ulcerative (chronic) pancolitis with other complication: Principal | ICD-10-CM

## 2020-03-27 DIAGNOSIS — R109 Unspecified abdominal pain: Principal | ICD-10-CM

## 2020-03-27 DIAGNOSIS — Z7952 Long term (current) use of systemic steroids: Principal | ICD-10-CM

## 2020-03-27 DIAGNOSIS — M069 Rheumatoid arthritis, unspecified: Principal | ICD-10-CM

## 2020-03-27 DIAGNOSIS — Z20822 Contact with and (suspected) exposure to covid-19: Principal | ICD-10-CM

## 2020-03-27 DIAGNOSIS — K219 Gastro-esophageal reflux disease without esophagitis: Principal | ICD-10-CM

## 2020-03-27 DIAGNOSIS — Z79899 Other long term (current) drug therapy: Principal | ICD-10-CM

## 2020-03-27 DIAGNOSIS — Z87891 Personal history of nicotine dependence: Principal | ICD-10-CM

## 2020-03-27 DIAGNOSIS — M4802 Spinal stenosis, cervical region: Principal | ICD-10-CM

## 2020-03-27 LAB — CBC
HEMATOCRIT: 39.2 % (ref 38.0–50.0)
HEMOGLOBIN: 12.8 g/dL — ABNORMAL LOW (ref 13.5–17.5)
MEAN CORPUSCULAR HEMOGLOBIN CONC: 32.6 g/dL (ref 30.0–36.0)
MEAN CORPUSCULAR HEMOGLOBIN: 25.9 pg — ABNORMAL LOW (ref 26.0–34.0)
MEAN CORPUSCULAR VOLUME: 79.3 fL — ABNORMAL LOW (ref 81.0–95.0)
MEAN PLATELET VOLUME: 8.3 fL (ref 7.0–10.0)
PLATELET COUNT: 297 10*9/L (ref 150–450)
RED BLOOD CELL COUNT: 4.95 10*12/L (ref 4.32–5.72)
RED CELL DISTRIBUTION WIDTH: 14.9 % (ref 12.0–15.0)
WBC ADJUSTED: 11.4 10*9/L — ABNORMAL HIGH (ref 3.5–10.5)

## 2020-03-27 LAB — SEDIMENTATION RATE, MANUAL: ERYTHROCYTE SEDIMENTATION RATE: 115 mm/h — ABNORMAL HIGH (ref 0–20)

## 2020-03-27 LAB — COMPREHENSIVE METABOLIC PANEL
ALBUMIN: 3.2 g/dL — ABNORMAL LOW (ref 3.4–5.0)
ALKALINE PHOSPHATASE: 73 U/L (ref 46–116)
ALT (SGPT): 14 U/L (ref 10–49)
ANION GAP: 5 mmol/L (ref 5–14)
AST (SGOT): 16 U/L (ref <=34)
BILIRUBIN TOTAL: 0.3 mg/dL (ref 0.3–1.2)
BLOOD UREA NITROGEN: 10 mg/dL (ref 9–23)
BUN / CREAT RATIO: 15
CALCIUM: 9 mg/dL (ref 8.7–10.4)
CHLORIDE: 104 mmol/L (ref 98–107)
CO2: 27.9 mmol/L (ref 20.0–31.0)
CREATININE: 0.67 mg/dL
EGFR CKD-EPI AA MALE: >90 mL/min/{1.73_m2} (ref >=60)
EGFR CKD-EPI NON-AA MALE: >90 mL/min/{1.73_m2} (ref >=60)
GLUCOSE RANDOM: 111 mg/dL (ref 70–179)
POTASSIUM: 4.4 mmol/L (ref 3.4–4.5)
PROTEIN TOTAL: 6.5 g/dL (ref 5.7–8.2)
SODIUM: 137 mmol/L (ref 135–145)

## 2020-03-27 LAB — C-REACTIVE PROTEIN
C-REACTIVE PROTEIN: 13 mg/L — ABNORMAL HIGH (ref <=10.0)
C-REACTIVE PROTEIN: 13 mg/L — ABNORMAL HIGH (ref <=10.0)

## 2020-03-27 MED ADMIN — MORPhine injection 6 mg: 6 mg | INTRAVENOUS | @ 09:00:00 | Stop: 2020-03-27

## 2020-03-27 MED ADMIN — enoxaparin (LOVENOX) syringe 40 mg: 40 mg | SUBCUTANEOUS | @ 14:00:00 | Stop: 2020-03-29

## 2020-03-27 MED ADMIN — lactated ringers bolus 1,000 mL: 1000 mL | INTRAVENOUS | @ 06:00:00 | Stop: 2020-03-27

## 2020-03-27 MED ADMIN — multivitamins, therapeutic with minerals tablet 1 tablet: 1 | ORAL | @ 14:00:00 | Stop: 2020-03-29

## 2020-03-27 MED ADMIN — iohexoL (OMNIPAQUE) 350 mg iodine/mL solution 100 mL: 100 mL | INTRAVENOUS | @ 07:00:00 | Stop: 2020-03-27

## 2020-03-27 MED ADMIN — pantoprazole (PROTONIX) EC tablet 20 mg: 20 mg | ORAL | @ 14:00:00 | Stop: 2020-03-29

## 2020-03-27 MED ADMIN — oxyCODONE (ROXICODONE) immediate release tablet 10 mg: 10 mg | ORAL | @ 23:00:00 | Stop: 2020-03-29

## 2020-03-27 MED ADMIN — sulfaSALAzine (AZULFIDINE) tablet 1,000 mg: 1000 mg | ORAL | @ 17:00:00 | Stop: 2020-03-29

## 2020-03-27 MED ADMIN — folic acid (FOLVITE) tablet 1 mg: 1 mg | ORAL | @ 14:00:00 | Stop: 2020-03-29

## 2020-03-27 MED ADMIN — sulfaSALAzine (AZULFIDINE) tablet 1,000 mg: 1000 mg | ORAL | @ 22:00:00 | Stop: 2020-03-29

## 2020-03-27 MED ADMIN — pregabalin (LYRICA) capsule 225 mg: 225 mg | ORAL | @ 14:00:00 | Stop: 2020-03-29

## 2020-03-27 MED ADMIN — ondansetron (ZOFRAN) injection 4 mg: 4 mg | INTRAVENOUS | @ 06:00:00 | Stop: 2020-03-27

## 2020-03-27 MED ADMIN — methylPREDNISolone sodium succinate (PF) (Solu-MEDROL) injection 20 mg: 20 mg | INTRAVENOUS | @ 22:00:00 | Stop: 2020-03-29

## 2020-03-27 MED ADMIN — lactated Ringers infusion: 75 mL/h | INTRAVENOUS | @ 12:00:00 | Stop: 2020-03-28

## 2020-03-27 MED ADMIN — acetaminophen (TYLENOL) tablet 1,000 mg: 1000 mg | ORAL | @ 19:00:00 | Stop: 2020-03-29

## 2020-03-27 MED ADMIN — pregabalin (LYRICA) capsule 225 mg: 225 mg | ORAL | @ 22:00:00 | Stop: 2020-03-29

## 2020-03-27 MED ADMIN — MORPhine 4 mg/mL injection 4 mg: 4 mg | INTRAVENOUS | @ 06:00:00 | Stop: 2020-03-27

## 2020-03-27 MED ADMIN — sulfaSALAzine (AZULFIDINE) tablet 1,000 mg: 1000 mg | ORAL | @ 14:00:00 | Stop: 2020-03-29

## 2020-03-27 MED ADMIN — acetaminophen (TYLENOL) tablet 1,000 mg: 1000 mg | ORAL | @ 14:00:00 | Stop: 2020-03-29

## 2020-03-27 MED ADMIN — HYDROmorphone (PF) (DILAUDID) injection 1 mg: 1 mg | INTRAVENOUS | @ 10:00:00 | Stop: 2020-03-27

## 2020-03-27 NOTE — Unmapped (Signed)
Patient has ulcerative colitis. Reports generalized, sharp abdominal pain X 4 days. Endorses diarrhea. Denies N/V/Fevers.   Sees GI specialist.

## 2020-03-27 NOTE — Unmapped (Signed)
[facility]   History and Physical    Assessment/Plan:    Principal Problem:    Ulcerative colitis, acute, other complication (CMS-HCC)  Active Problems:    Rheumatoid arthritis (CMS-HCC)      Kevin Tran is a 60 y.o. male with PMHx of Ulcerative Colitis, esophageal dysphagia, RA, and cervical spinal stenosis  who presents to [facility] with Ulcerative colitis, acute, other complication (CMS-HCC).    Ulcerative Colitis with Flare: Pain is most concerning for UC flare given associated with increased in watery, non-bloody diarrhea. Could also consider PUD or GERD given more epigastric nature of the pain. Inflammatory markers significantly elevated. No clear infectious symptoms. Of note he is just finishing a steroid taper. 2 weeks ago was started on sulfasalazine. Will defer initiating steroids until his C diff and Gi pathogen panels come back.   - F/u C. Diff, GI path panel  - Tylenol/Oxy/Dilaudid for pain control  - Zofran PRN for nausea   - GI consult ordered, please call in AM (ok to stay at [facility] per their communication with ED physician)  - Continue Sulfasalazine   - Continue Protonix for home omeprazole    Rheumatoid Arthritis: Patient not taking methotrexate due to concerns for potential side effects. His joint pain has been under control, likely due to recent steroid taper.   - CTM    Esophageal Dysphagia: Protonix as above    Back Pain: Lyrica daily and gabapentin nightly     Code Status:  Full code, discussed on admission, his wife [name] would be his [facility] [phone].   ___________________________________________________________________    Chief Complaint  Chief Complaint   Patient presents with   ??? Abdominal Pain       HPI:  Kevin Tran is a 60 y.o. male with PMHx of Ulcerative Colitis, esophageal dysphagia, RA, and cervical spinal stenosis  who presents to [facility] with Ulcerative colitis, acute, other complication (CMS-HCC).    He reports that he has had 3-4 days of sharp, generalized abdominal pain worse in the upper quadrants accompanied by diffuse, waterly, non-bloody diarrhea. He is eating but not well. He reports waking up with night sweats several times over the last 2 weeks.     He reports that 2 weeks ago his medicines were changed. He did not want to take methotrexate, which was prescribed to him for joint symptoms, so was started on sulfasalazine, and a prednisone taper.    He continues on [place]. Previously on [place] but lost response.     Vital signs have been stable throughout ED stay. WBC was 13.0, Hg stable, creatinine and LFTs also unremarkable. Inflammatory marker elevated with sed rate of 115 and CRP of 13 prior baseline 14/1. CT AP demonstrated Long segment circumferential wall thickening with associated hyperenhancement extending from the level of the mid-ascending colon through the rectum. No abscess of fluid collection.     His pain remained hard to control, requiring multiple doses of IV medications.     Of note, patient had COVID on Jan 4 and was able to get MAB infusion. Reports he has overall mild symptoms.     Allergies:  Patient has no known allergies.     Medications:   Prior to Admission medications    Medication Dose, Route, Frequency   acetaminophen (TYLENOL) 500 MG tablet 500 mg, Oral, Every 6 hours PRN   fluticasone propionate (FLONASE) 50 mcg/actuation nasal spray USE 2 SPRAY IN EACH NOSTRIL ONCE DAILY FOR ALLERGIES   folic  acid (FOLVITE) 1 MG tablet 1 mg, Oral, Daily (standard)   gabapentin (NEURONTIN) 300 MG capsule 300 mg, Oral, Nightly, Take 2-3 tabs PO qhs for pain   multivitamin capsule 1 capsule, Oral, Daily (standard)   omeprazole (PRILOSEC) 20 MG capsule 20 mg, Oral, Daily (standard)   predniSONE (DELTASONE) 10 MG tablet Take 2 tablets (20 mg total) by mouth daily for 14 days, THEN 1.5 tablets (15 mg total) daily for 5 days, THEN 1 tablet (10 mg total) daily for 5 days, THEN 0.5 tablets (5 mg total) daily for 5 days.   pregabalin (LYRICA) 225 MG capsule TK ONE C PO  BID   sulfaSALAzine (AZULFIDINE) 500 mg tablet 1,000 mg, Oral, 4 times a day   ustekinumab (STELARA) 90 mg/mL Syrg syringe 90 mg, Subcutaneous, every 8 weeks       Medical History:  Past Medical History:   Diagnosis Date   ??? Arthritis    ??? Pneumothorax    ??? Ulcerative colitis (CMS-HCC)        Surgical History:  Past Surgical History:   Procedure Laterality Date   ??? BACK SURGERY  1996   ??? COLONOSCOPY     ??? PR COLONOSCOPY W/BIOPSY SINGLE/MULTIPLE Left 07/01/2018    Procedure: COLONOSCOPY, FLEXIBLE, PROXIMAL TO SPLENIC FLEXURE; WITH BIOPSY, SINGLE OR MULTIPLE;  Surgeon: [name], MD;  Location: [facility]ED[facility];  Service: Gastroenterology   ??? PR COLONOSCOPY W/BIOPSY SINGLE/MULTIPLE  10/20/2019    Procedure: COLONOSCOPY, FLEXIBLE, PROXIMAL TO SPLENIC FLEXURE; WITH BIOPSY, SINGLE OR MULTIPLE;  Surgeon: Luanne Bras, MD;  Location: [facility];  Service: Gastroenterology       Social History:  Social History     Socioeconomic History   ??? Marital status: Married     Spouse name: Not on file   ??? Number of children: Not on file   ??? Years of education: Not on file   ??? Highest education level: Not on file   Occupational History   ??? Not on file   Tobacco Use   ??? Smoking status: Former Smoker     Types: Cigarettes   ??? Smokeless tobacco: Never Used   Vaping Use   ??? Vaping Use: Never used   Substance and Sexual Activity   ??? Alcohol use: Yes     Alcohol/week: 2.0 standard drinks     Types: 2 Cans of beer per week   ??? Drug use: No   ??? Sexual activity: Not on file   Other Topics Concern   ??? Not on file   Social History Narrative   ??? Not on file     Social Determinants of Health     Financial Resource Strain: Not on file   Food Insecurity: Not on file   Transportation Needs: Not on file   Physical Activity: Not on file   Stress: Not on file   Social Connections: Not on file       Family History:  Family History   Problem Relation Age of Onset   ??? Cancer Mother        Review of Systems:  10 systems reviewed and are negative unless otherwise mentioned in HPI      Physical Exam:  Temp:  [36.4 ??C (97.6 ??F)] 36.4 ??C (97.6 ??F)  Heart Rate:  [65-88] 65  SpO2 Pulse:  [88] 88  Resp:  [18] 18  BP: (141-151)/(80-87) 150/81  SpO2:  [95 %-100 %] 95 %  Body mass index  is 25.85 kg/m??.    General: No acute distress, alert, oriented and communicative  HEENT: PEERL, EOMI, oropharynx without lesions or exudates  Respiratory: Lungs CTAB, no wheezes, rales or rhonchi, chest expansion symmetric  Cardiovascular: Regular rate and rhythm, no murmurs, rubs or gallops, extremities warm  Gastrointestinal: Bowel sounds present but diminished, Abdomen \\tender  to palpation in all quadrants, more so in upper quadrants, no rebound or guarding, no obvious organomegally  Extremities: No LE edema, distal pulses palpable   MSK: Some sponginess of PIP joints, no muscle tenderness to palpation  Skin: No diaphoresis, no rashes or wounds on examined skin   Neuro: Alert and oriented, no facial droop, moving all extremities symmetrically      Test Results:  Data Review:    All lab results last 24 hours:    Recent Results (from the past 24 hour(s))   Comprehensive Metabolic Panel    Collection Time: 03/26/20  8:38 PM   Result Value Ref Range    Sodium 137 135 - 145 mmol/L    Potassium 4.4 3.4 - 4.5 mmol/L    Chloride 102 98 - 107 mmol/L    Anion Gap 7 5 - 14 mmol/L    CO2 28.5 20.0 - 31.0 mmol/L    BUN 15 9 - 23 mg/dL    Creatinine 0.98 1.19 - 1.10 mg/dL    BUN/Creatinine Ratio 16     EGFR CKD-EPI Non-African American, Male >90 >=60 mL/min/1.73m2    EGFR CKD-EPI African American, Male >90 >=60 mL/min/1.32m2    Glucose 132 70 - 179 mg/dL    Calcium 9.5 8.7 - 14.7 mg/dL    Albumin 3.6 3.4 - 5.0 g/dL    Total Protein 7.3 5.7 - 8.2 g/dL    Total Bilirubin 0.3 0.3 - 1.2 mg/dL    AST 14 <=82 U/L    ALT 15 10 - 49 U/L    Alkaline Phosphatase 85 46 - 116 U/L   Lipase Level    Collection Time: 03/26/20  8:38 PM   Result Value Ref Range Lipase 47 12 - 53 U/L   CBC w/ Differential    Collection Time: 03/26/20  8:38 PM   Result Value Ref Range    WBC 13.0 (H) 3.5 - 10.5 10*9/L    RBC 5.17 4.32 - 5.72 10*12/L    HGB 13.5 13.5 - 17.5 g/dL    HCT 95.6 21.3 - 08.6 %    MCV 79.8 (L) 81.0 - 95.0 fL    MCH 26.0 26.0 - 34.0 pg    MCHC 32.6 30.0 - 36.0 g/dL    RDW 57.8 46.9 - 62.9 %    MPV 8.0 7.0 - 10.0 fL    Platelet 318 150 - 450 10*9/L    nRBC 0 <=4 /100 WBCs    Neutrophils % 81.9 %    Lymphocytes % 9.1 %    Monocytes % 6.9 %    Eosinophils % 1.7 %    Basophils % 0.4 %    Absolute Neutrophils 10.7 (H) 1.7 - 7.7 10*9/L    Absolute Lymphocytes 1.2 0.7 - 4.0 10*9/L    Absolute Monocytes 0.9 0.1 - 1.0 10*9/L    Absolute Eosinophils 0.2 0.0 - 0.7 10*9/L    Absolute Basophils 0.0 0.0 - 0.1 10*9/L   C-reactive protein    Collection Time: 03/26/20  8:38 PM   Result Value Ref Range    CRP 13.0 (H) <=10.0 mg/L   Urinalysis with Culture Reflex  Collection Time: 03/26/20  8:39 PM    Specimen: Clean Catch; Urine   Result Value Ref Range    Color, UA Yellow     Clarity, UA Cloudy     Specific Gravity, UA 1.020 1.005 - 1.040    pH, UA 7.0 5.0 - 9.0    Leukocyte Esterase, UA Negative Negative    Nitrite, UA Negative Negative    Protein, UA Negative Negative    Glucose, UA Negative Negative    Ketones, UA Negative Negative    Urobilinogen, UA 0.2 mg/dL     Bilirubin, UA Negative Negative    Blood, UA Negative Negative    RBC, UA <1 <3 /HPF    WBC, UA <1 <2 /HPF    Squam Epithel, UA <1 0 - 5 /HPF    Bacteria, UA None Seen None Seen /HPF    Amorphous Crystal, UA Many /HPF   Sedimentation Rate    Collection Time: 03/27/20  5:14 AM   Result Value Ref Range    Sed Rate 115 (H) 0 - 20 mm/h   Type and Screen    Collection Time: 03/27/20  5:14 AM   Result Value Ref Range    Check Type Need Type Check     Blood Type O POS     Antibody Screen NEG        Imaging: Radiology studies were personally reviewed and CT Abdomen Pelvis with IV Contrast ONLY    Result Date: 03/27/2020  EXAM: CT ABDOMEN PELVIS W CONTRAST DATE: 03/26/2020 ACCESSION: [id number] UN DICTATED: 03/27/2020 1:38 AM INTERPRETATION LOCATION: [facility] CLINICAL INDICATION: diffuse abd pain ; Abdominal pain, acute, nonlocalized  COMPARISON: No prior available. TECHNIQUE: A spiral CT scan of the abdomen and pelvis was obtained with IV contrast from the lung bases through the pubic symphysis. Images were reconstructed in the axial plane. Coronal and sagittal reformatted images were also provided for further evaluation. FINDINGS: LOWER THORAX: Scattered areas of linear/subsegmental atelectasis are seen throughout both lung fields. Otherwise, the visualized portions of the lower chest are unremarkable. ABDOMEN/PELVIS: HEPATOBILIARY: Ill-defined focus of hypoattenuation along the falciform, likely reflecting focal fatty deposition. Otherwise, no focal hepatic lesion is identified. Gallbladder is unremarkable. No biliary ductal dilatation.  SPLEEN: No focal parenchymal abnormality. PANCREAS: No focal mass or ductal dilatation. ADRENALS: Unremarkable. KIDNEYS/URETERS: Symmetric nephrograms. No enhancing solid renal mass lesion. No evidence of hydronephrosis. BLADDER: Moderately distended with urine, but otherwise unremarkable in appearance. PELVIC/REPRODUCTIVE ORGANS: Unremarkable. Well-corticated calcification posterior to the left seminal vesicle. GI TRACT: No evidence of obstruction. Circumferential fatty deposition within the submucosa of the cecum and ascending colon, which can be seen in the setting of chronic inflammation and patient's reported history of ulcerative colitis. Note is made of mild circumferential wall thickening and mucosal hyperenhancement which extends from the level of the midascending colon through the rectum, which is nonspecific but may be seen in the setting of colitis (e.g. inflammatory given patient's known diagnosis). No significant adjacent inflammatory stranding or focal fluid collection/abscess is identified. The appendix is unremarkable. PERITONEUM/RETROPERITONEUM AND MESENTERY: No intraperitoneal free air or fluid identified. LYMPH NODES: No pathologically enlarged lymph nodes are seen throughout the abdomen or pelvis. VESSELS: The aorta is normal in caliber.  Scattered calcifications of the abdominal aorta and its branch vessels. The portal venous system is patent. The hepatic veins and IVC are unremarkable. BONES AND SOFT TISSUES: Bilateral gynecomastia. No acute osseous abnormality. Multilevel degenerative changes throughout the spine. Well-circumscribed sclerotic focus within the sacrum on  the left (2:115), most consistent with a bone island. Fat-containing umbilical hernia.     1.Long segment circumferential wall thickening with associated hyperenhancement extending from the level of the mid-ascending colon through the rectum. While no significant adjacent inflammatory stranding is identified, these findings could potentially be seen in the setting of colitis (e.g. inflammatory given patient's reported history of ulcerative colitis). No intraperitoneal free air or pericolonic fluid collection/abscess identified. 2.Other chronic or incidental findings, as noted above.

## 2020-03-27 NOTE — Unmapped (Signed)
Problem: Adult Inpatient Plan of Care  Goal: Plan of Care Review  Outcome: Ongoing - Unchanged  Goal: Patient-Specific Goal (Individualized)  Outcome: Ongoing - Unchanged  Goal: Absence of Hospital-Acquired Illness or Injury  Outcome: Ongoing - Unchanged  Intervention: Prevent and Manage VTE (Venous Thromboembolism) Risk  Recent Flowsheet Documentation  Taken 03/27/2020 0830 by Richrd Sox, RN  Activity Management: ambulated to bathroom  Goal: Optimal Comfort and Wellbeing  Outcome: Ongoing - Unchanged  Goal: Readiness for Transition of Care  Outcome: Ongoing - Unchanged  Goal: Rounds/Family Conference  Outcome: Ongoing - Unchanged     Problem: Infection  Goal: Absence of Infection Signs and Symptoms  Outcome: Ongoing - Unchanged   Pt had 1 episode of loose stools this shift. Specimen was sent for c diff and GI panel. Pt ambulates well with steady gait. Reminded to call for assistance as needed. Tolerating clear liquids well. IVF infusing @ 75 mls/ hr via PIV.

## 2020-03-27 NOTE — Unmapped (Signed)
[facility] Gastroenterology Consult Service  Initial Consultation    Reason for Consultation:   Pt is seen in consultation at the request of [name], * (Med Undesignated (MDX)) for assistance in management of ulcerative colitis.    Assessment and Recommendations:   Kevin Tran is a 60 y.o. male with left-sided ulcerative colitis (stelara, sulfasalazine), dysphagia, RA, spinal stenosis presenting with abdomina pain and diarrhea.     Assessment:  This patient has previously lost response to therapies [name], [name]), but GI manifestations had been responding to Stelara. He is now presently with acute onset of frequent watery stools with abdominal pain. His current clinical history seems somewhat different than prior flares in that the pain is higher in abdomen and his stools are non-bloody. His inflammatory markers and CT findings of more extensive colonic involvement than he has had previously, which could be seen in either infectious or inflammatory presentation as they are non-specific. Therefore, not entirely clear what the primary driver of presentation would be and would want to rule out infection first. This also may represent UC flare as he does think his symptoms may have worsened last week with decrease in prednisone dose. His joints symptoms remain relatively improved from what they were. If C diff positive, will treat that. If C diff negative, will plan flex sig with plan to look at as much of colon as we can with plan for biopsies to also evaluate for CMV or other potential infection. Would wait on steroids until C diff returns, but if C diff negative could give steroids as below. His endoscopic findings will help determine next steps in management. If his UC has flared despite initiation of Stelara, may have to consider other options for therapy. For now, will plan to perform this procedure at [facility], but there may be an indication for transfer to [facility] depending on findings. Also appreciate effort to trend labs and keep stool diary.     Recommendations:  Dx:  -Likely Flex Sig tomorrow with plan for biopsies as well if c diff negative  -Please trend daily CBC, CMP, CRP  -F/u C diff and GIPP  -print out stool diary for patient on page 7 of [place] [facility] Severe UC Protocol [available here: [email or web address]]  Tx:  -Clear liquid diet today then NPO at MN  -if C diff negative, start methylprednisolone 20mg  TID  -please make sure patient is on chemical DVT ppx as patients with IBD at higher risk for VTE  -please minimize opioids as much as possible  -please plan for enemas x 2 tomorrow prior to endoscopy    -GI Pre-Procedure Checklist:  Procedure: Flexible sigmoidoscopy  Anticoagulation: hold any AC  Diet: Please order clear liquid diet and NPO after midnight   Prep: Please order two tap water enemas 30 minutes apart tomorrow    Thank you for involving [name] in the care of your patient. We will continue to follow along with you. This patient was seen and examined with Dr. Alben Spittle.     For questions, please contact the on-call fellow for the [facility] Gastroenterology Consult Service at [phone] (available 8AM-5PM only, excluding weekends and holidays).     Luanne Bras, MD  [facility] Gastroenterology and Hepatology Fellow    Subjective:   HPI:  Kevin Tran is a 60 y.o. male with left-sided ulcerative colitis (stelara, sulfasalazine), dysphagia, RA, spinal stenosis presenting with abdomina pain and diarrhea.     Pt is followed by Dr. Raphael Gibney as outpatient so please  see his notes for further details. Briefly, he first developed symptoms in 2018 and was diagnosed in 2019 at which point he was treated with courses of prednisone. Then started on Humira without significant improvement and had antibodies so 6-MP was added. Initially responded to Humira, but then lost response so was changed to [place] in May 2020. Limited response initially then Uceris foam helped. Then in May 2021 had severe UC flare on [name] so performed restaging scope that showed moderate proctitis so was rotated to Stelara and Canasa. Most recently there was plan to add methotrexate for joint pain last month. There was concern this was extra intestinal IBD manifestation. Patient did not actually start methotrexate, but instead start sulfasalazine and Pred taper for joint pain.    Though he initially did not find Stelara to help, he confirms eventually found it helped with his bowel symptoms. After initial IV loading dose, he has had a couple of injections and is currently on an 8-week schedule and he thinks with his next due this month. However, as of 01/2020, his most prominent symptoms were joint pains. He did not feel comfortable using methotrexate as was discussed in clinic due to concern that he would have to take another medicine along with it and was worried about side effects he read about online. Instead, he was started on sulfasalazine a couple of weeks ago as well as prednisone taper from 20 to 10 mg with decrease to 10 last week. These changes in medications helped his joint symptoms and he is only having some soreness in his joints now. However, around the time the prednisone dose changed last week, he developed much more frequent watery stools. His typical baseline is around 7-10 watery stools a day, but then he started having them very frequently with 20 or more a day about 4 days ago. Did have episode of fecal incontinence. He also had much more significant abdominal pain that was located in his lower abdomen but his upper abdomen as well which is unusual for him. He confirms he has not been taking any NSAIDs. He has never had surgery for his IBD or been hospitalized for it. Said stools became black briefly yesterday but he thinks this was related to Pepto-Bismol and there are just clear watery again now. Also has had some night sweats over the last week. Has had a couple episodes of nocturnal stools. Denies any blood in stool, fevers, vomiting, nausea.    In the ED, vitals were stable. Labs: 13.0, Hg stable, creatinine and LFTs also unremarkable. Inflammatory marker elevated with sed rate of 115 and CRP of 13 prior baseline 14/1. CTAP showed Long segment circumferential wall thickening with associated hyperenhancement extending from the level of the mid-ascending colon through the rectum. Since being admitted, he has had significant pain requiring opioid treatment.    Allergies:  Patient has no known allergies.  Medications:   Prior to Admission medications    Medication Dose, Route, Frequency   acetaminophen (TYLENOL) 500 MG tablet 500 mg, Oral, Every 6 hours PRN   fluticasone propionate (FLONASE) 50 mcg/actuation nasal spray USE 2 SPRAY IN EACH NOSTRIL ONCE DAILY FOR ALLERGIES   folic acid (FOLVITE) 1 MG tablet 1 mg, Oral, Daily (standard)   gabapentin (NEURONTIN) 300 MG capsule 300 mg, Oral, Nightly, Take 2-3 tabs PO qhs for pain   multivitamin capsule 1 capsule, Oral, Daily (standard)   omeprazole (PRILOSEC) 20 MG capsule 20 mg, Oral, Daily (standard)   predniSONE (DELTASONE) 10 MG tablet  Take 2 tablets (20 mg total) by mouth daily for 14 days, THEN 1.5 tablets (15 mg total) daily for 5 days, THEN 1 tablet (10 mg total) daily for 5 days, THEN 0.5 tablets (5 mg total) daily for 5 days.   pregabalin (LYRICA) 225 MG capsule TK ONE C PO  BID   sulfaSALAzine (AZULFIDINE) 500 mg tablet 1,000 mg, Oral, 4 times a day   ustekinumab (STELARA) 90 mg/mL Syrg syringe 90 mg, Subcutaneous, every 8 weeks     Medical History:  Past Medical History:   Diagnosis Date   ??? Arthritis    ??? Pneumothorax    ??? Ulcerative colitis (CMS-HCC)      Surgical History:  Past Surgical History:   Procedure Laterality Date   ??? BACK SURGERY  1996   ??? COLONOSCOPY     ??? PR COLONOSCOPY W/BIOPSY SINGLE/MULTIPLE Left 07/01/2018    Procedure: COLONOSCOPY, FLEXIBLE, PROXIMAL TO SPLENIC FLEXURE; WITH BIOPSY, SINGLE OR MULTIPLE;  Surgeon: [name], MD;  Location: [facility];  Service: Gastroenterology   ??? PR COLONOSCOPY W/BIOPSY SINGLE/MULTIPLE  10/20/2019    Procedure: COLONOSCOPY, FLEXIBLE, PROXIMAL TO SPLENIC FLEXURE; WITH BIOPSY, SINGLE OR MULTIPLE;  Surgeon: Luanne Bras, MD;  Location: [facility];  Service: Gastroenterology     Social History:  Social History     Tobacco Use   ??? Smoking status: Former Smoker     Types: Cigarettes   ??? Smokeless tobacco: Never Used   Vaping Use   ??? Vaping Use: Never used   Substance Use Topics   ??? Alcohol use: Yes     Alcohol/week: 2.0 standard drinks     Types: 2 Cans of beer per week   ??? Drug use: No     Family History:  Family History   Problem Relation Age of Onset   ??? Cancer Mother      Review of Systems:  10 systems were reviewed and are negative unless otherwise mentioned in the HPI    Objective:   Physical Exam:  Temp:  [36.4 ??C-36.6 ??C] 36.6 ??C  Heart Rate:  [65-88] 70  SpO2 Pulse:  [88] 88  Resp:  [14-18] 16  BP: (119-151)/(66-87) 119/66  SpO2:  [95 %-100 %] 95 %  Wt Readings from Last 3 Encounters:   03/27/20 76.4 kg (168 lb 8 oz)   10/25/19 77.2 kg (170 lb 3.2 oz)   10/20/19 76.7 kg (169 lb)     Gen: NAD  HEENT: Conjunctiva anicteric  CV: RRR  Resp: Breathing comfortably  Abdomen: Soft/tender to palpation in all 4 quadrants including upper aspects of abdomen/nd, no rebound/guarding  MSK/Ext: Warm, well perfused  Psych/Neuro: Alert & oriented.     Labs/Studies:  Labs, studies, and imaging from the last 24hrs per EMR and personally reviewed.    Prior GI Procedures / Endoscopy:  Colonoscopy - 10/2019  Impression:            - Hemorrhoids found on perianal exam.                         - Moderately active (Mayo Score 2) ulcerative colitis                          from the anal verge to 10cm. Biopsied.                         -  Mild (Mayo Score 1) ulcerative colitis from 10cm to 20cm.                         - The examined portion of the ileum was normal.                         - Stool aspirated for C. difficile toxin    Sigmoidoscopy -06/2018  Impression:        - Preparation of the colon was poor.                     - Moderately active proctitis up to 15cm from anus, with                      friability, shallow ulcers and nodularity. Biopsied.                     - Mild pathcy nodularity in the remaning colon, was                      biopsied.                     - The terminal ileum is normal.    - Colonoscopy Feb 2019: edema + erythema in rectum and sigmoid; decreased vascularity in rest of colon    - Colonoscopy Dec 2019: moderate inflammation throughout the colon    Abdominal imaging studies:  CT Abdomen Pelvis with IV Contrast ONLY    Result Date: 03/27/2020  EXAM: CT ABDOMEN PELVIS W CONTRAST DATE: 03/26/2020 ACCESSION: [id number] UN DICTATED: 03/27/2020 1:38 AM INTERPRETATION LOCATION: [facility]     CLINICAL INDICATION: diffuse abd pain ; Abdominal pain, acute, nonlocalized      COMPARISON: No prior available.     TECHNIQUE: A spiral CT scan of the abdomen and pelvis was obtained with IV contrast from the lung bases through the pubic symphysis. Images were reconstructed in the axial plane. Coronal and sagittal reformatted images were also provided for further evaluation.     FINDINGS:     LOWER THORAX: Scattered areas of linear/subsegmental atelectasis are seen throughout both lung fields. Otherwise, the visualized portions of the lower chest are unremarkable.     ABDOMEN/PELVIS: HEPATOBILIARY: Ill-defined focus of hypoattenuation along the falciform, likely reflecting focal fatty deposition. Otherwise, no focal hepatic lesion is identified. Gallbladder is unremarkable. No biliary ductal dilatation.  SPLEEN: No focal parenchymal abnormality. PANCREAS: No focal mass or ductal dilatation.     ADRENALS: Unremarkable. KIDNEYS/URETERS: Symmetric nephrograms. No enhancing solid renal mass lesion. No evidence of hydronephrosis.     BLADDER: Moderately distended with urine, but otherwise unremarkable in appearance. PELVIC/REPRODUCTIVE ORGANS: Unremarkable. Well-corticated calcification posterior to the left seminal vesicle.     GI TRACT: No evidence of obstruction. Circumferential fatty deposition within the submucosa of the cecum and ascending colon, which can be seen in the setting of chronic inflammation and patient's reported history of ulcerative colitis. Note is made of mild circumferential wall thickening and mucosal hyperenhancement which extends from the level of the midascending colon through the rectum, which is nonspecific but may be seen in the setting of colitis (e.g. inflammatory given patient's known diagnosis). No significant adjacent inflammatory stranding or focal fluid collection/abscess is identified.     The appendix is unremarkable.     PERITONEUM/RETROPERITONEUM AND MESENTERY: No intraperitoneal free air or  fluid identified. LYMPH NODES: No pathologically enlarged lymph nodes are seen throughout the abdomen or pelvis. VESSELS: The aorta is normal in caliber.  Scattered calcifications of the abdominal aorta and its branch vessels. The portal venous system is patent. The hepatic veins and IVC are unremarkable.     BONES AND SOFT TISSUES: Bilateral gynecomastia. No acute osseous abnormality. Multilevel degenerative changes throughout the spine. Well-circumscribed sclerotic focus within the sacrum on the left (2:115), most consistent with a bone island. Fat-containing umbilical hernia.         1.Long segment circumferential wall thickening with associated hyperenhancement extending from the level of the mid-ascending colon through the rectum. While no significant adjacent inflammatory stranding is identified, these findings could potentially be seen in the setting of colitis (e.g. inflammatory given patient's reported history of ulcerative colitis). No intraperitoneal free air or pericolonic fluid collection/abscess identified. 2.Other chronic or incidental findings, as noted above.    - CT 01/28/18: left sided colitis; liver lesion (see below)  - MRI abdomen to evaluate liver lesion 02/09/18 - aforementioned liver lesion felt to be focal fatty infiltration;  6 month repeat recommended

## 2020-03-27 NOTE — Unmapped (Signed)
[facility] Emergency Department Provider Note    Patient Identification  Kevin Tran  Patient information was obtained from patient.  History/Exam limitations: none.    ED Clinical Impression     Final diagnoses:   None       Initial Impression, ED Course, Assessment and Plan     Impression: Kevin Tran is a 60 y.o. male with a past medical history of ulcerative colitis, esophageal dysphagia, pneumothorax, and cervical spinal stenosis who presents for evaluation of 2-3 days of persistent, sharp, generalized abdominal pain with associated non-bloody diarrhea.    On exam, the patient is nontoxic-appearing and in no acute distress. Vitals are within normal limits. Exam significant for diffuse abdominal tenderness to palpation without rebound or guarding. No flank pain.     Labs ordered in triage, including UA, lipase, and basic labs, significant for leukocytosis to 13.0 but otherwise unremarkable. Differential includes gastritis, enteritis, appendicitis, ulcer colitis flare, lower concern for renal pathology at this time..     Plan for CT A/P. Will give Morphine, Zofran, and IV fluids for symptom management at this time.     ED Course:     2:19 AM   CT A/P significant for circumferential wall-thickening and hyperenhancement from the mid-ascending colon to the rectum, consistent with colitis. No identifiable free air or abscess.     3:11 AM   On reassessment, patient continues to report significant abdominal pain. Will give higher dose Morphine.     4:47 AM   Patient's pain continues to persist.  Will give a dose Dilaudid.  Still parental concern for your ulcer colitis flare.  Will page [facility] for admission.     Hospitalist asked to discuss with GI for potential early management as well as appropriateness of bed placement of patient [place] versus [place].  Discussed with on-call GI, comfortable with keeping an [place], obtain a C. difficile and GI pathogen panel prior to steroid use.    Additional Medical Decision Making     I independently visualized the radiology images.   I reviewed the patient's prior medical records.       Any labs and radiology results that were available during my care of the patient were independently reviewed by me and considered in my medical decision making.    Portions of this record have been created using Scientist, clinical (histocompatibility and immunogenetics). Dictation errors have been sought, but may not have been identified and corrected.  ____________________________________________    I have reviewed the triage vital signs and the nursing notes.      Patient History     Chief Complaint  Abdominal Pain      HPI   Kevin Tran is a 60 y.o. male with a past medical history of ulcerative colitis, esophageal dysphagia, pneumothorax, and cervical spinal stenosis who presents for evaluation of abdominal pain. The patient reports 2-3 days of persistent, sharp, generalized abdominal pain with associated non-bloody diarrhea. He notes reduced PO intake due to increased pain and diarrhea after eating. No personal hx of similar abdominal pain. Of note, the patient has a personal hx of ulcerative colitis, for which he is followed by [facility]. He is currently on Stelara for management of his ulcerative colitis but has not recently required surgical intervention. The patient reports a personal hx of surgery following a pneumothorax approximately 20 years ago. Daily medications include prednisone 10 mg daily. He denies fever, nausea, or vomiting.       Past Medical History:  Diagnosis Date   ??? Arthritis    ??? Pneumothorax    ??? Ulcerative colitis (CMS-HCC)        Patient Active Problem List   Diagnosis   ??? Ulcerative colitis, acute, other complication (CMS-HCC)   ??? COVID-19   ??? Rheumatoid arthritis (CMS-HCC)       Past Surgical History:   Procedure Laterality Date   ??? BACK SURGERY  1996   ??? COLONOSCOPY     ??? PR COLONOSCOPY W/BIOPSY SINGLE/MULTIPLE Left 07/01/2018    Procedure: COLONOSCOPY, FLEXIBLE, PROXIMAL TO SPLENIC FLEXURE; WITH BIOPSY, SINGLE OR MULTIPLE;  Surgeon: [name], MD;  Location: [facility];  Service: Gastroenterology   ??? PR COLONOSCOPY W/BIOPSY SINGLE/MULTIPLE  10/20/2019    Procedure: COLONOSCOPY, FLEXIBLE, PROXIMAL TO SPLENIC FLEXURE; WITH BIOPSY, SINGLE OR MULTIPLE;  Surgeon: Luanne Bras, MD;  Location: [facility]ED[facility];  Service: Gastroenterology         Current Facility-Administered Medications:   ???  acetaminophen (TYLENOL) tablet 1,000 mg, 1,000 mg, Oral, Q8H SCH, [name], MD, 1,000 mg at 03/28/20 1510  ???  albuterol 2.5 mg /3 mL (0.083 %) nebulizer solution 2.5 mg, 2.5 mg, Nebulization, Q4H PRN, [name], MD  ???  aluminum-magnesium hydroxide-simethicone (MAALOX MAX) 80-80-8 mg/mL oral suspension, 30 mL, Oral, Q4H PRN, [name], MD  ???  enoxaparin (LOVENOX) syringe 40 mg, 40 mg, Subcutaneous, Q24H SCH, [name], MD, 40 mg at 03/27/20 1610  ???  folic acid (FOLVITE) tablet 1 mg, 1 mg, Oral, Daily, [name], MD, 1 mg at 03/28/20 9604  ???  gabapentin (NEURONTIN) capsule 300 mg, 300 mg, Oral, Nightly, [name], MD, 300 mg at 03/28/20 2027  ???  guaiFENesin (ROBITUSSIN) oral syrup, 200 mg, Oral, Q4H PRN, [name], MD  ???  HYDROmorphone (PF) (DILAUDID) injection 1 mg, 1 mg, Intravenous, Q4H PRN, [name], MD  ???  melatonin tablet 3 mg, 3 mg, Oral, Nightly PRN, [name], MD, 3 mg at 03/28/20 2058  ???  melatonin tablet 3 mg, 3 mg, Oral, At bedtime, [name], MD, 3 mg at 03/28/20 2027  ???  methylPREDNISolone sodium succinate (PF) (Solu-MEDROL) injection 20 mg, 20 mg, Intravenous, TID, [name], MD, 20 mg at 03/28/20 2027  ???  multivitamins, therapeutic with minerals tablet 1 tablet, 1 tablet, Oral, Daily, [name], MD, 1 tablet at 03/27/20 5409  ???  naloxone [facility]) injection 0.1 mg, 0.1 mg, Intravenous, Q5 Min PRN, [name], MD  ???  ondansetron (ZOFRAN-ODT) disintegrating tablet 8 mg, 8 mg, Oral, Q8H PRN **OR** ondansetron (ZOFRAN) injection 4 mg, 4 mg, Intravenous, Q8H PRN, [name], MD  ???  oxyCODONE (ROXICODONE) immediate release tablet 10 mg, 10 mg, Oral, Q4H PRN, [name], MD, 10 mg at 03/27/20 1736  ???  pantoprazole (PROTONIX) EC tablet 20 mg, 20 mg, Oral, Daily, [name], MD, 20 mg at 03/28/20 8119  ???  pregabalin (LYRICA) capsule 225 mg, 225 mg, Oral, BID, [name], MD, 225 mg at 03/28/20 1700  ???  sulfaSALAzine (AZULFIDINE) tablet 1,000 mg, 1,000 mg, Oral, QID, [name], MD, 1,000 mg at 03/28/20 2027    Allergies  Patient has no known allergies.    Family History   Problem Relation Age of Onset   ??? Cancer Mother        Social History  Social History  Tobacco Use   ??? Smoking status: Former Smoker     Types: Cigarettes   ??? Smokeless tobacco: Never Used   Vaping Use   ??? Vaping Use: Never used   Substance Use Topics   ??? Alcohol use: Yes     Alcohol/week: 2.0 standard drinks     Types: 2 Cans of beer per week   ??? Drug use: No     Review of Systems  General: no fevers, chills  Cardiac: no CP, SOB, palpitations  Pulmonary: no cough, sputum production, hemoptysis  A 10 point review of systems was negative except for those previously mentioned in the HPI and above.    Physical Exam     ED Triage Vitals [03/26/20 2016]   Enc Vitals Group      BP 151/87      Heart Rate 88      SpO2 Pulse 88      Resp 18      Temp 36.4 ??C (97.6 ??F)      Temp Source Oral      SpO2 100 %      Weight 77.1 kg (170 lb)      Height 1.727 m (5' 8)     Constitutional: alert and cooperative in NAD  ENT:       Head: [place]/AT       Eyes: PERRL, conjunctiva clear, sclera anicteric       Nose: no congestion       Mouth/Throat: MM moist       Neck: supple, midline trachea, no tenderness or cervical adenopathy  Cardiac: RRR, normal S1, S2, no appreciable murmurs or rubs. Distal pulses present and symmetrical B/L  Respiratory: CTA bilaterally, non-labored breathing, no stridor, wheezing or crackles  Abdomen: soft, diffuse abdominal tenderness to palpation without rebound or guarding, normal BS. No masses.   Extremities: LE normal with no CCE, symmetric in size and NTTP  Skin: warm and dry, no rashes or lesions  Neurologic: no gross focal neurologic deficits appreciated  Psychiatric: normal mood, affect, speech and behavior      Radiology     CT Abdomen Pelvis with IV Contrast ONLY   Final Result   1.Long segment circumferential wall thickening with associated hyperenhancement extending from the level of the mid-ascending colon through the rectum. While no significant adjacent inflammatory stranding is identified, these findings could potentially be seen in the setting of colitis (e.g. inflammatory given patient's reported history of ulcerative colitis). No intraperitoneal free air or pericolonic fluid collection/abscess identified.    2.Other chronic or incidental findings, as noted above.         I independently visualized these images.    _____________________________________________________  Documentation assistance was provided by Bryna Colander, Scribe on March 26, 2020 at 11:15 PM for Irven Baltimore, MD.     Documentation assistance was provided by the scribe in my presence.  The documentation recorded by the scribe has been reviewed by me and accurately reflects the services I personally performed.        Harriet Pho, MD  03/29/20 [phone]

## 2020-03-28 DIAGNOSIS — Z20822 Contact with and (suspected) exposure to covid-19: Principal | ICD-10-CM

## 2020-03-28 DIAGNOSIS — Z79899 Other long term (current) drug therapy: Principal | ICD-10-CM

## 2020-03-28 DIAGNOSIS — Z7952 Long term (current) use of systemic steroids: Principal | ICD-10-CM

## 2020-03-28 DIAGNOSIS — M549 Dorsalgia, unspecified: Principal | ICD-10-CM

## 2020-03-28 DIAGNOSIS — Z87891 Personal history of nicotine dependence: Principal | ICD-10-CM

## 2020-03-28 DIAGNOSIS — R1319 Other dysphagia: Principal | ICD-10-CM

## 2020-03-28 DIAGNOSIS — K219 Gastro-esophageal reflux disease without esophagitis: Principal | ICD-10-CM

## 2020-03-28 DIAGNOSIS — R109 Unspecified abdominal pain: Principal | ICD-10-CM

## 2020-03-28 DIAGNOSIS — K51018 Ulcerative (chronic) pancolitis with other complication: Principal | ICD-10-CM

## 2020-03-28 DIAGNOSIS — M4802 Spinal stenosis, cervical region: Principal | ICD-10-CM

## 2020-03-28 DIAGNOSIS — M069 Rheumatoid arthritis, unspecified: Principal | ICD-10-CM

## 2020-03-28 LAB — COMPREHENSIVE METABOLIC PANEL
ALBUMIN: 3.3 g/dL — ABNORMAL LOW (ref 3.4–5.0)
ALKALINE PHOSPHATASE: 77 U/L (ref 46–116)
ALT (SGPT): 18 U/L (ref 10–49)
ANION GAP: 7 mmol/L (ref 5–14)
AST (SGOT): 11 U/L (ref <=34)
BILIRUBIN TOTAL: 0.4 mg/dL (ref 0.3–1.2)
BLOOD UREA NITROGEN: 10 mg/dL (ref 9–23)
BUN / CREAT RATIO: 12
CALCIUM: 9.7 mg/dL (ref 8.7–10.4)
CHLORIDE: 105 mmol/L (ref 98–107)
CO2: 27.9 mmol/L (ref 20.0–31.0)
CREATININE: 0.81 mg/dL
EGFR CKD-EPI AA MALE: >90 mL/min/{1.73_m2} (ref >=60)
EGFR CKD-EPI NON-AA MALE: >90 mL/min/{1.73_m2} (ref >=60)
GLUCOSE RANDOM: 107 mg/dL (ref 70–179)
POTASSIUM: 4.6 mmol/L — ABNORMAL HIGH (ref 3.4–4.5)
PROTEIN TOTAL: 6.7 g/dL (ref 5.7–8.2)
SODIUM: 140 mmol/L (ref 135–145)

## 2020-03-28 LAB — CBC
HEMATOCRIT: 39.1 % (ref 38.0–50.0)
HEMOGLOBIN: 12.8 g/dL — ABNORMAL LOW (ref 13.5–17.5)
MEAN CORPUSCULAR HEMOGLOBIN CONC: 32.8 g/dL (ref 30.0–36.0)
MEAN CORPUSCULAR HEMOGLOBIN: 26.1 pg (ref 26.0–34.0)
MEAN CORPUSCULAR VOLUME: 79.6 fL — ABNORMAL LOW (ref 81.0–95.0)
MEAN PLATELET VOLUME: 8.1 fL (ref 7.0–10.0)
PLATELET COUNT: 315 10*9/L (ref 150–450)
RED BLOOD CELL COUNT: 4.91 10*12/L (ref 4.32–5.72)
RED CELL DISTRIBUTION WIDTH: 15 % (ref 12.0–15.0)
WBC ADJUSTED: 10.5 10*9/L (ref 3.5–10.5)

## 2020-03-28 LAB — MAGNESIUM: MAGNESIUM: 2 mg/dL (ref 1.6–2.6)

## 2020-03-28 LAB — C-REACTIVE PROTEIN: C-REACTIVE PROTEIN: 8 mg/L (ref <=10.0)

## 2020-03-28 MED ADMIN — propofoL (DIPRIVAN) injection: INTRAVENOUS | @ 19:00:00 | Stop: 2020-03-28

## 2020-03-28 MED ADMIN — acetaminophen (TYLENOL) tablet 1,000 mg: 1000 mg | ORAL | @ 02:00:00 | Stop: 2020-03-29

## 2020-03-28 MED ADMIN — lidocaine (XYLOCAINE) 20 mg/mL (2 %) injection: INTRAVENOUS | @ 19:00:00 | Stop: 2020-03-28

## 2020-03-28 MED ADMIN — pregabalin (LYRICA) capsule 225 mg: 225 mg | ORAL | @ 14:00:00 | Stop: 2020-03-29

## 2020-03-28 MED ADMIN — melatonin tablet 3 mg: 3 mg | ORAL | @ 05:00:00 | Stop: 2020-03-29

## 2020-03-28 MED ADMIN — lactated Ringers infusion: INTRAVENOUS | @ 19:00:00 | Stop: 2020-03-28

## 2020-03-28 MED ADMIN — methylPREDNISolone sodium succinate (PF) (Solu-MEDROL) injection 20 mg: 20 mg | INTRAVENOUS | @ 02:00:00 | Stop: 2020-03-29

## 2020-03-28 MED ADMIN — acetaminophen (TYLENOL) tablet 1,000 mg: 1000 mg | ORAL | @ 20:00:00 | Stop: 2020-03-29

## 2020-03-28 MED ADMIN — methylPREDNISolone sodium succinate (PF) (Solu-MEDROL) injection 20 mg: 20 mg | INTRAVENOUS | @ 14:00:00 | Stop: 2020-03-29

## 2020-03-28 MED ADMIN — sulfaSALAzine (AZULFIDINE) tablet 1,000 mg: 1000 mg | ORAL | @ 11:00:00 | Stop: 2020-03-29

## 2020-03-28 MED ADMIN — methylPREDNISolone sodium succinate (PF) (Solu-MEDROL) injection 20 mg: 20 mg | INTRAVENOUS | @ 20:00:00 | Stop: 2020-03-29

## 2020-03-28 MED ADMIN — melatonin tablet 3 mg: 3 mg | ORAL | @ 02:00:00 | Stop: 2020-03-29

## 2020-03-28 MED ADMIN — acetaminophen (TYLENOL) tablet 1,000 mg: 1000 mg | ORAL | @ 11:00:00 | Stop: 2020-03-29

## 2020-03-28 MED ADMIN — sulfaSALAzine (AZULFIDINE) tablet 1,000 mg: 1000 mg | ORAL | @ 02:00:00 | Stop: 2020-03-29

## 2020-03-28 MED ADMIN — pregabalin (LYRICA) capsule 225 mg: 225 mg | ORAL | @ 22:00:00 | Stop: 2020-03-29

## 2020-03-28 MED ADMIN — pantoprazole (PROTONIX) EC tablet 20 mg: 20 mg | ORAL | @ 14:00:00 | Stop: 2020-03-29

## 2020-03-28 MED ADMIN — gabapentin (NEURONTIN) capsule 300 mg: 300 mg | ORAL | @ 02:00:00 | Stop: 2020-03-29

## 2020-03-28 MED ADMIN — folic acid (FOLVITE) tablet 1 mg: 1 mg | ORAL | @ 14:00:00 | Stop: 2020-03-29

## 2020-03-28 MED ADMIN — sulfaSALAzine (AZULFIDINE) tablet 1,000 mg: 1000 mg | ORAL | @ 21:00:00 | Stop: 2020-03-29

## 2020-03-28 NOTE — Unmapped (Signed)
No acute events overnight. A&Ox4, RA, afebrile and VSS. Placed NPO at [facility] for possible procedure. Will continue to monitor.    Problem: Adult Inpatient Plan of Care  Goal: Plan of Care Review  Outcome: Progressing  Goal: Patient-Specific Goal (Individualized)  Outcome: Progressing  Goal: Absence of Hospital-Acquired Illness or Injury  Intervention: Identify and Manage Fall Risk  Recent Flowsheet Documentation  Taken 03/28/2020 0400 by Hillery Aldo, RN  Safety Interventions:   low bed   nonskid shoes/slippers when out of bed   isolation precautions  Taken 03/28/2020 0200 by Hillery Aldo, RN  Safety Interventions:   low bed   nonskid shoes/slippers when out of bed   isolation precautions  Taken 03/28/2020 0000 by Hillery Aldo, RN  Safety Interventions:   low bed   nonskid shoes/slippers when out of bed   isolation precautions  Taken 03/27/2020 2200 by Hillery Aldo, RN  Safety Interventions:   low bed   nonskid shoes/slippers when out of bed   isolation precautions  Taken 03/27/2020 2000 by Hillery Aldo, RN  Safety Interventions:   low bed   nonskid shoes/slippers when out of bed   isolation precautions     Problem: Infection  Goal: Absence of Infection Signs and Symptoms  Outcome: Progressing  Intervention: Prevent or Manage Infection  Recent Flowsheet Documentation  Taken 03/28/2020 0400 by Hillery Aldo, RN  Isolation Precautions: enteric precautions maintained  Taken 03/28/2020 0200 by Hillery Aldo, RN  Isolation Precautions: enteric precautions maintained  Taken 03/28/2020 0000 by Hillery Aldo, RN  Isolation Precautions: enteric precautions maintained  Taken 03/27/2020 2200 by Hillery Aldo, RN  Isolation Precautions: enteric precautions maintained  Taken 03/27/2020 2000 by Hillery Aldo, RN  Isolation Precautions: enteric precautions maintained

## 2020-03-28 NOTE — Unmapped (Signed)
[facility] Gastroenterology Consult Service  Progress Note    Assessment & Plan:   Kevin Tran is a 60 y.o. male with left-sided ulcerative colitis (stelara, sulfasalazine), dysphagia, RA, spinal stenosis presenting with abdominal pain and diarrhea. Pt was seen in consultation at the request of Kevin Tran, * (Med Undesignated (MDX)) for assistance in management of ulcerative colitis.    Assessment:  This patient has previously lost response to therapies Kevin Tran, Kevin Tran), but GI manifestations had been responding to Stelara prior to current presentation with worsening abdominal pain and more watery stools. These symptoms feel somewhat different than prior flares in that the pain is higher in abdomen and his stools are non-bloody. His inflammatory markers and CT findings were concerning for possible infectious colitis vs UC flare. His infectious stool studies were negative so proceeded with flex sig with biopsies today. Flex sig 2/15 showed moderately active (Mayo Score 2) UC through examined colon. Biopsies were taken to rule out CMV (these were sent for rushed pathology). For further details, please see the complete report available in the Media tab and Procedures tab under Chart Review. Symptoms seem to be responding to IV steroids for now so would continue those while we await biopsy results. If this is thought to be driven by UC, may need to discuss next possible steps in terms of therapy. This will likely include oral prednisone taper. Since he has already been on Stelara for about 5 months, will plan to discuss further therapy options with Kevin Tran tomorrow.     Recommendations:  -F/u colonic biopsies for CMV  -Please trend daily CBC, CMP, CRP  -Please have stool output recorded closely in chart  -Continue stool diary for patient on page 7 of [facility] Severe UC Protocol  -continue methylprednisolone 20mg  TID  -please make sure patient is on chemical DVT ppx as patients with IBD at higher risk for VTE  -please minimize opioids as much as possible  -please see post-procedure recs below    GI Post-Procedure Checklist:  Anticoagulation: DVT ppx can be resumed  Diet: Resume previous diet  Repeat endoscopy: TBD    Thank you for involving [name] in the care of your patient. We will continue to follow along with you.  This patient was seen and examined with Dr. Stevphen Rochester.     For questions, please contact the on-call fellow for the [facility] Gastroenterology Consult Service at [phone] (available 8AM-5PM only, excluding weekends and holidays).     Kevin Bras, MD  [facility] Gastroenterology and Hepatology Fellow    My interaction with the patient was limited to the time spent during the flexible sigmoidoscopy today. I reviewed the resident???s note.  I agree with the resident???s findings and plan. Kevin Nan, MD, PhD         Interval History:   No acute events overnight.    C diff and GIPP negative. Patient reports improvement in abdominal pain and stool output since starting steroids. Had one stool yesterday evening and one this morning.     Objective:   Temp:  [36 ??C-36.6 ??C] 36 ??C  Heart Rate:  [70-79] 72  Resp:  [16] 16  BP: (119-134)/(66-75) 131/72  SpO2:  [95 %] 95 %     Gen: NAD  HEENT: Conjunctiva anicteric  CV: RRR  Resp: Breathing comfortably  Abdomen: Soft/tender to palpation in all 4 quadrants including upper aspects of abdomen/nd, no rebound/guarding  MSK/Ext: Warm, well perfused  Psych/Neuro: Alert & oriented.  Labs/Studies: Labs and Studies from the last 24hrs per EMR and Reviewed

## 2020-03-28 NOTE — Unmapped (Signed)
Hospital Medicine (MEDX) Progress Note    Assessment & Plan:   Kevin Tran is a 60 y.o. male with a PMHx of ulcerative colitis that presented to [facility] with UC exacerbation.    Principal Problem:    Ulcerative colitis, acute, other complication (CMS-HCC)  Active Problems:    Rheumatoid arthritis (CMS-HCC)  Resolved Problems:    * No resolved hospital problems. *    Ulcerative colitis, in exacerbation: Cdiff and GIPP negative, started on IV steroids. CRP downtrending today after two doses.  - GI consulted, plan for flex sig today  - IV methylprednisolone 20mg  TID; will sch melatonin for insomnia  - Tylenol/Oxy/Dilaudid for pain control  - Zofran PRN for nausea  - stool diary provided for pt  - Continue Sulfasalazine   - Continue Protonix for home omeprazole    Chronic Problems:  Rheumatoid Arthritis: Not on controller medicine other than above. Steroids as above.??  Esophageal Dysphagia: Protonix as above  Back Pain: Lyrica daily and gabapentin nightly     Daily Checklist:  Diet: NPO  DVT PPx: Lovenox 40mg  q24h  Electrolytes: No Repletion Needed  Code Status: Full Code  Dispo: continue floor care    Team Contact Information:   Primary Team: Hospital Medicine (MEDX)  Primary Physician: [name], MD    Interval History:   No acute events overnight. Patient states pain is well controlled.  NPO, awaiting study. GIPP negative this morning    All other systems were reviewed and are negative except as noted in the HPI    Objective:   Temp:  [36 ??C (96.8 ??F)-37.1 ??C (98.8 ??F)] 36.4 ??C (97.5 ??F)  Heart Rate:  [66-80] 72  SpO2 Pulse:  [73-82] 73  Resp:  [16-20] 18  BP: (118-131)/(66-76) 118/74  SpO2:  [94 %-96 %] 94 %    Gen: WDWN male in NAD, answers questions appropriately  Eyes: sclera anicteric, EOMI  HENT: atraumatic, MMM  Heart: RRR, S1, S2, no M/R/G  Lungs: lungs cleas  Abdomen: normoactive bowel sounds, soft, NTND, no rebound/guarding  Extremities: no edema in the BLEs  Psych: Alert, oriented, appropriate mood, flat affect    Labs/Studies: Labs and Studies from the last 24hrs per EMR and Reviewed  CRP downtrending. Lytes stable.

## 2020-03-28 NOTE — Unmapped (Signed)
Problem: Adult Inpatient Plan of Care  Goal: Plan of Care Review  Outcome: Progressing  Goal: Patient-Specific Goal (Individualized)  Outcome: Progressing  Goal: Absence of Hospital-Acquired Illness or Injury  Outcome: Progressing  Goal: Optimal Comfort and Wellbeing  Outcome: Progressing  Goal: Readiness for Transition of Care  Outcome: Progressing  Goal: Rounds/Family Conference  Outcome: Progressing     Problem: Infection  Goal: Absence of Infection Signs and Symptoms  Outcome: Progressing   Pt remains alert and coherent. Ambulates with steady gait. Kept NPO for GI procedure. Pt is encouraged to call for assistance as needed. Wife remains very supportive with his care.

## 2020-03-29 LAB — COMPREHENSIVE METABOLIC PANEL
ALBUMIN: 3.2 g/dL — ABNORMAL LOW (ref 3.4–5.0)
ALKALINE PHOSPHATASE: 72 U/L (ref 46–116)
ALT (SGPT): 16 U/L (ref 10–49)
ANION GAP: 6 mmol/L (ref 5–14)
AST (SGOT): 12 U/L (ref <=34)
BILIRUBIN TOTAL: 0.4 mg/dL (ref 0.3–1.2)
BLOOD UREA NITROGEN: 14 mg/dL (ref 9–23)
BUN / CREAT RATIO: 17
CALCIUM: 9.8 mg/dL (ref 8.7–10.4)
CHLORIDE: 106 mmol/L (ref 98–107)
CO2: 27.6 mmol/L (ref 20.0–31.0)
CREATININE: 0.81 mg/dL
EGFR CKD-EPI AA MALE: >90 mL/min/{1.73_m2} (ref >=60)
EGFR CKD-EPI NON-AA MALE: >90 mL/min/{1.73_m2} (ref >=60)
GLUCOSE RANDOM: 106 mg/dL (ref 70–179)
POTASSIUM: 3.9 mmol/L (ref 3.4–4.5)
PROTEIN TOTAL: 6.7 g/dL (ref 5.7–8.2)
SODIUM: 140 mmol/L (ref 135–145)

## 2020-03-29 LAB — CBC
HEMATOCRIT: 40.2 % (ref 38.0–50.0)
HEMOGLOBIN: 12.9 g/dL — ABNORMAL LOW (ref 13.5–17.5)
MEAN CORPUSCULAR HEMOGLOBIN CONC: 32.1 g/dL (ref 30.0–36.0)
MEAN CORPUSCULAR HEMOGLOBIN: 25.8 pg — ABNORMAL LOW (ref 26.0–34.0)
MEAN CORPUSCULAR VOLUME: 80.2 fL — ABNORMAL LOW (ref 81.0–95.0)
MEAN PLATELET VOLUME: 8.2 fL (ref 7.0–10.0)
PLATELET COUNT: 318 10*9/L (ref 150–450)
RED BLOOD CELL COUNT: 5.01 10*12/L (ref 4.32–5.72)
RED CELL DISTRIBUTION WIDTH: 14.9 % (ref 12.0–15.0)
WBC ADJUSTED: 9.9 10*9/L (ref 3.5–10.5)

## 2020-03-29 LAB — MAGNESIUM: MAGNESIUM: 1.8 mg/dL (ref 1.6–2.6)

## 2020-03-29 LAB — C-REACTIVE PROTEIN: C-REACTIVE PROTEIN: 5 mg/L (ref <=10.0)

## 2020-03-29 MED ORDER — MELATONIN 3 MG TABLET
Freq: Every evening | ORAL | 0 refills | 0 days | PRN
Start: 2020-03-29 — End: ?

## 2020-03-29 MED ORDER — OXYCODONE 10 MG TABLET
ORAL_TABLET | ORAL | 0 refills | 1 days | Status: CP | PRN
Start: 2020-03-29 — End: ?

## 2020-03-29 MED ORDER — PREDNISONE 10 MG TABLET
ORAL_TABLET | ORAL | 0 refills | 28 days | Status: CP
Start: 2020-03-29 — End: 2020-04-26

## 2020-03-29 MED ADMIN — acetaminophen (TYLENOL) tablet 1,000 mg: 1000 mg | ORAL | @ 11:00:00 | Stop: 2020-03-29

## 2020-03-29 MED ADMIN — sulfaSALAzine (AZULFIDINE) tablet 1,000 mg: 1000 mg | ORAL | @ 18:00:00 | Stop: 2020-03-29

## 2020-03-29 MED ADMIN — pantoprazole (PROTONIX) EC tablet 20 mg: 20 mg | ORAL | @ 14:00:00 | Stop: 2020-03-29

## 2020-03-29 MED ADMIN — sulfaSALAzine (AZULFIDINE) tablet 1,000 mg: 1000 mg | ORAL | @ 01:00:00 | Stop: 2020-03-29

## 2020-03-29 MED ADMIN — butalbital-acetaminophen-caffeine (ESGIC) per tablet 1 tablet: 1 | ORAL | @ 01:00:00 | Stop: 2020-03-28

## 2020-03-29 MED ADMIN — methylPREDNISolone sodium succinate (PF) (Solu-MEDROL) injection 20 mg: 20 mg | INTRAVENOUS | @ 01:00:00 | Stop: 2020-03-29

## 2020-03-29 MED ADMIN — sulfaSALAzine (AZULFIDINE) tablet 1,000 mg: 1000 mg | ORAL | @ 11:00:00 | Stop: 2020-03-29

## 2020-03-29 MED ADMIN — acetaminophen (TYLENOL) tablet 1,000 mg: 1000 mg | ORAL | @ 20:00:00 | Stop: 2020-03-29

## 2020-03-29 MED ADMIN — melatonin tablet 3 mg: 3 mg | ORAL | @ 01:00:00 | Stop: 2020-03-29

## 2020-03-29 MED ADMIN — methylPREDNISolone sodium succinate (PF) (Solu-MEDROL) injection 20 mg: 20 mg | INTRAVENOUS | @ 14:00:00 | Stop: 2020-03-29

## 2020-03-29 MED ADMIN — multivitamins, therapeutic with minerals tablet 1 tablet: 1 | ORAL | @ 14:00:00 | Stop: 2020-03-29

## 2020-03-29 MED ADMIN — melatonin tablet 3 mg: 3 mg | ORAL | @ 02:00:00 | Stop: 2020-03-29

## 2020-03-29 MED ADMIN — gabapentin (NEURONTIN) capsule 300 mg: 300 mg | ORAL | @ 01:00:00 | Stop: 2020-03-29

## 2020-03-29 MED ADMIN — enoxaparin (LOVENOX) syringe 40 mg: 40 mg | SUBCUTANEOUS | @ 14:00:00 | Stop: 2020-03-29

## 2020-03-29 MED ADMIN — sulfaSALAzine (AZULFIDINE) tablet 1,000 mg: 1000 mg | ORAL | @ 23:00:00 | Stop: 2020-03-29

## 2020-03-29 MED ADMIN — folic acid (FOLVITE) tablet 1 mg: 1 mg | ORAL | @ 14:00:00 | Stop: 2020-03-29

## 2020-03-29 MED ADMIN — methylPREDNISolone sodium succinate (PF) (Solu-MEDROL) injection 20 mg: 20 mg | INTRAVENOUS | @ 20:00:00 | Stop: 2020-03-29

## 2020-03-29 NOTE — Unmapped (Addendum)
Ulcerative colitis, in exacerbation: Admitted with significant pain, which is not typical for his UC. WBC was 13.0, Hg stable, creatinine and LFTs also unremarkable. Inflammatory marker elevated with sed rate of 115 and CRP of 13 prior baseline 14/1. CT AP demonstrated Long segment circumferential wall thickening with associated hyperenhancement extending from the level of the mid-ascending colon through the rectum. No abscess of fluid collection. C.diff, GIPP negative, started on IV solumedrol 20mg  TID. Flex sigmoidoscopy on 2/15 with moderately active UC from descending colon to rectum. Biopsies obtained to evaluate for CMV. Pain slowly improved and was tolerating diet without use of opiates. Continued sulfasalazine and PPI while hospitalized. Plan for slow taper of steroids, 40mg  for 7 days, 30mg  for 7 days, 20mg  for 7 days, and 10mg  for 7 days. Dr Raphael Gibney recommended giving Stelara dose tonight, and will coordinate additional doses in clinic.     Chronic Problems:  Rheumatoid Arthritis: Not on controller medicine other than above. Has had significant flaring. On steroids as above, may benefit from additional rheumatology evaluation pending outpatient course.   Esophageal Dysphagia: Protonix as above  Back Pain: gabapentin nightly

## 2020-03-29 NOTE — Unmapped (Signed)
VSS. All scheduled meds given. Pt was able to sleep for long intervals after getting Melatonin. No reports of any fall/injury, will continue to monitor.  Problem: Adult Inpatient Plan of Care  Goal: Plan of Care Review  Outcome: Progressing  Goal: Absence of Hospital-Acquired Illness or Injury  Intervention: Identify and Manage Fall Risk  Recent Flowsheet Documentation  Taken 03/28/2020 2000 by Cleatrice Burke, RN BSN  Safety Interventions: fall reduction program maintained     Problem: Infection  Goal: Absence of Infection Signs and Symptoms  Outcome: Progressing

## 2020-03-29 NOTE — Unmapped (Signed)
[facility] Gastroenterology Consult Service  Progress Note    Assessment & Plan:   Kevin Tran is a 60 y.o. male with left-sided ulcerative colitis (stelara, sulfasalazine), dysphagia, RA, spinal stenosis presenting with abdominal pain and diarrhea. Pt was seen in consultation at the request of [name], * (Med Undesignated (MDX)) for assistance in management of ulcerative colitis.    Assessment:  This patient has previously lost response to therapies [name], [name]), but GI manifestations had been responding to Stelara prior to current presentation with worsening abdominal pain and more watery stools. These symptoms feel somewhat different than prior flares in that the pain is higher in abdomen and his stools are non-bloody.  His infectious stool studies were negative so proceeded with flex sig with biopsies today. Flex sig 2/15 showed moderately active (Mayo Score 2) UC through examined colon. Biopsies were taken to rule out CMV and these were negative. Overall, his presentation is consistent with more severe UC flare than he has had in the past. Symptoms responded to IV steroids so would discharge on PO taper as below. Dr Raphael Gibney will discuss next steps in therapy with him as outpatient. For now, will plan to have him take next Stelara dose now as he can increase frequency from q8 weeks to q4 weeks though may also discussed Rem[employer] as another option for therapy. He will continue to follow closely with Dr. Raphael Gibney in clinic who will also loop in his IBD RN.    Recommendations:  -Okay to discharge from our perspective  -Please discharge with prednisone taper (40mg  x 7 days then 30mg  x 7 days then 20mg  x 7 days then 10mg  x 7 days then 5mg  x 7 days)  -will plan to discuss further therapy options as outpatient (asked patient to take his next dose of Stelara that he has at home which was supposed to be given later this month)    Thank you for involving [name] in the care of your patient. We will continue to follow along with you.  This patient was seen and examined with Dr. Raphael Gibney.     For questions, please contact the on-call fellow for the [facility] Gastroenterology Consult Service at [phone] (available 8AM-5PM only, excluding weekends and holidays).     Luanne Bras, MD  [facility] Gastroenterology and Hepatology Fellow    Interval History:   No acute events overnight.    Patient reports abdominal pain continues to improve. Only 1 BM today.     Objective:   Temp:  [35.6 ??C-37.1 ??C] 35.6 ??C  Heart Rate:  [65-80] 75  SpO2 Pulse:  [73-82] 73  Resp:  [16-20] 18  BP: (105-128)/(62-79) 105/62  SpO2:  [94 %-96 %] 95 %     Gen: NAD  HEENT: Conjunctiva anicteric  CV: RRR  Resp: Breathing comfortably  Abdomen: Soft/tenderness to palpation significantly improved/nd, no rebound/guarding  MSK/Ext: Warm, well perfused  Psych/Neuro: Alert & oriented.     Labs/Studies: Labs and Studies from the last 24hrs per EMR and Reviewed

## 2020-03-29 NOTE — Unmapped (Signed)
Hospital Medicine (MEDX) Progress Note    Assessment & Plan:   Kevin Tran is a 60 y.o. male with a PMHx of ulcerative colitis that presented to [facility] with UC exacerbation.    Principal Problem:    Ulcerative colitis, acute, other complication (CMS-HCC)  Active Problems:    Rheumatoid arthritis (CMS-HCC)  Resolved Problems:    * No resolved hospital problems. *    Ulcerative colitis, in exacerbation: Flex sigmoidoscopy on 2/15 with moderately active UC from descending colon to rectum. Biopsies obtained to evaluate for CMV. Pain improved today, tolerating diet this morning.  - GI consulted, Dr Raphael Gibney (primary GI) on service today, will determine IV v PO steroids course given degree of inflammation  - IV methylprednisolone 20mg  TID; sch melatonin for insomnia  - Tylenol/Oxy/Dilaudid for pain control  - Zofran PRN for nausea  - stool diary provided for pt  - Continue Sulfasalazine   - Continue Protonix for home omeprazole    Chronic Problems:  Rheumatoid Arthritis: Not on controller medicine other than above. Steroids as above.??  Esophageal Dysphagia: Protonix as above  Back Pain: Lyrica daily and gabapentin nightly     Daily Checklist:  Diet: Regular Diet  DVT PPx: Lovenox 40mg  q24h  Electrolytes: No Repletion Needed  Code Status: Full Code  Dispo: continue floor care    Team Contact Information:   Primary Team: Hospital Medicine (MEDX)  Primary Physician: [name], MD    Interval History:   No acute events overnight. Patient states pain is well controlled, no opiates overnight. Continues to slowly improve, tolerating PO.     All other systems were reviewed and are negative except as noted in the HPI    Objective:   Temp:  [35.6 ??C (96.1 ??F)-37.1 ??C (98.8 ??F)] 36 ??C (96.8 ??F)  Heart Rate:  [64-80] 64  SpO2 Pulse:  [73-82] 73  Resp:  [16-20] 18  BP: (105-128)/(62-79) 111/66  SpO2:  [94 %-95 %] 95 %    Gen: WDWN male in NAD, answers questions appropriately  Eyes: sclera anicteric, EOMI  HENT: atraumatic, MMM  Heart: RRR, S1, S2, no M/R/G  Lungs: lungs cleas  Abdomen: normoactive bowel sounds, soft, NTND, no rebound/guarding  Extremities: no edema in the BLEs  Psych: Alert, oriented, appropriate mood, flat affect    Labs/Studies: Labs and Studies from the last 24hrs per EMR and Reviewed

## 2020-03-29 NOTE — Unmapped (Signed)
Minimal discharge needs have been identified for this patient.  Anticipated needs are: None.  CM will continue to follow for avoidable delays and opportunities for progression of care.   03/29/2020 7:44 AM         Care Management  Initial Transition Planning Assessment              General  Care Manager assessed the patient by : Medical record review,Discussion with Clinical Care team    Contact/Decision Maker  Extended Emergency Contact Information  Primary Emergency Contact: Mckenny,Robin  Address: [address]           [place], [name] [address] [place]  Home Phone: [phone]  Mobile Phone: [phone]  Relation: Spouse    Legal Next of Kin / Guardian / POA / Advance Directives       Advance Directive (Medical Treatment)  Does patient have an advance directive covering medical treatment?: Patient does not have advance directive covering medical treatment.  Reason patient does not have an advance directive covering medical treatment:: Patient does not wish to complete one at this time.    Health Care Decision Maker [HCDM] (Medical & Mental Health Treatment)  Healthcare Decision Maker: Patient does not wish to appoint a Health Care Decision Maker at this time  Information offered on HCDM, Medical & Mental Health advance directives:: Patient declined information.         Patient Information  Lives with: Spouse/significant other    Type of Residence: Private residence [address] [name] [address]      Support Systems/Concerns: Spouse,Children    Home Care services in place prior to admission?: No    Financial Information       Need for financial assistance?: No       Social Determinants of Health  Social Determinants of Health were addressed in provider documentation.  Please refer to patient history.  Social Determinants of Health     Tobacco Use: Medium Risk   ??? Smoking Tobacco Use: Former Smoker   ??? Smokeless Tobacco Use: Never Used   Alcohol Use: Not At Risk   ??? How often do you have a drink containing alcohol?: Monthly or less   ??? How many drinks containing alcohol do you have on a typical day when you are drinking?: Not on file   ??? How often do you have 5 or more drinks on one occasion?: Not on file   Financial Resource Strain: Not on file   Food Insecurity: Not on file   Transportation Needs: Not on file   Physical Activity: Not on file   Stress: Not on file   Social Connections: Not on file   Intimate Partner Violence: Not on file   Depression: Not on file   Housing/Utilities: Not on file   Substance Use: Not on file   Health Literacy: Not on file       Discharge Needs Assessment  Concerns to be Addressed: no discharge needs identified,denies needs/concerns at this time    Clinical Risk Factors: New Diagnosis    Discharge Facility/Level of Care Needs:  No needs    Readmission  Risk of Unplanned Readmission Score: UNPLANNED READMISSION SCORE: 10%  Predictive Model Details          10% (Low)  Factor Value    Calculated 03/29/2020 04:04 34% Number of active Rx orders 29    [facility] Risk of Unplanned Readmission Model 11% Imaging order present in last 6 months  10% Latest hemoglobin low (12.8 g/dL)     9% Number of ED visits in last six months 1     8% Age 5     7% Active anticoagulant Rx order present     7% Active corticosteroid Rx order present     6% Charlson Comorbidity Index 3     3% Future appointment scheduled     3% Current length of stay 1.923 days     2% Active ulcer medication Rx order present      Readmitted Within the Last 30 Days? (No if blank)   Patient at risk for readmission?: No    Discharge Plan  Screen findings are: Care Manager reviewed the plan of the patient's care with the Multidisciplinary Team. No discharge planning needs identified at this time. Care Manager will continue to manage plan and monitor patient's progress with the team.    Expected Discharge Date: 03/31/2020    Expected Transfer from Critical Care:  (n/A)    Quality data for continuing care services shared with patient and/or representative?: N/A  Patient and/or family were provided with choice of facilities / services that are available and appropriate to meet post hospital care needs?: N/A       Initial Assessment complete?: Yes      Clenton Pare  March 29, 2020 7:44 AM

## 2020-03-30 DIAGNOSIS — K51918 Ulcerative colitis, unspecified with other complication: Principal | ICD-10-CM

## 2020-03-30 DIAGNOSIS — K5130 Ulcerative (chronic) rectosigmoiditis without complications: Principal | ICD-10-CM

## 2020-03-30 MED ORDER — STELARA 90 MG/ML SUBCUTANEOUS SYRINGE
SUBCUTANEOUS | 5 refills | 28.00000 days | Status: CP
Start: 2020-03-30 — End: ?

## 2020-03-30 NOTE — Unmapped (Signed)
Physician Discharge Summary [facility]  [facility]  [address]  [name] [address]  Dept: [phone]  Loc: [phone]     Identifying Information:   Kevin Tran  05/08/1960  [id number]    Primary Care Physician: [facility]     Code Status: Full Code    Admit Date: 03/26/2020    Discharge Date: 03/29/2020     Discharge To: Home    Discharge Service: [facility] - [facility]: Hospitalist Service #2     Discharge Attending Physician: [name], MD    Discharge Diagnoses:   Principal Problem:    Ulcerative colitis, acute, other complication (CMS-HCC) POA: Yes  Active Problems:    Rheumatoid arthritis (CMS-HCC) POA: Unknown  Resolved Problems:    * No resolved hospital problems. *      Hospital Course:   Ulcerative colitis, in exacerbation: Admitted with significant pain, which is not typical for his UC. WBC was 13.0, Hg stable, creatinine and LFTs also unremarkable. Inflammatory marker elevated with sed rate of 115 and CRP of 13 prior baseline 14/1. CT AP demonstrated Long segment circumferential wall thickening with associated hyperenhancement extending from the level of the mid-ascending colon through the rectum. No abscess of fluid collection. C.diff, GIPP negative, started on IV solumedrol 20mg  TID. Flex sigmoidoscopy on 2/15 with moderately active UC from descending colon to rectum. Biopsies obtained to evaluate for CMV. Pain slowly improved and was tolerating diet without use of opiates. Continued sulfasalazine and PPI while hospitalized. Plan for slow taper of steroids, 40mg  for 7 days, 30mg  for 7 days, 20mg  for 7 days, and 10mg  for 7 days. Dr Raphael Gibney recommended giving Stelara dose tonight, and will coordinate additional doses in clinic.     Chronic Problems:  Rheumatoid Arthritis: Not on controller medicine other than above. Has had significant flaring. On steroids as above, may benefit from additional rheumatology evaluation pending outpatient course.   Esophageal Dysphagia: Protonix as above  Back Pain: gabapentin nightly     Outpatient Provider Follow Up Issues:   Additional steroid dosing, Stelara timing    Touchbase with Outpatient Provider:  Warm Handoff: Not completed secondary to after hours discharge    Procedures:  None  ______________________________________________________________________  Discharge Medications:     Your Medication List      STOP taking these medications    ondansetron 4 MG disintegrating tablet  Commonly known as: ZOFRAN-ODT     pregabalin 225 MG capsule  Commonly known as: LYRICA        START taking these medications    melatonin 3 mg Tab  Take 1 tablet (3 mg total) by mouth nightly as needed.     oxyCODONE 10 mg immediate release tablet  Commonly known as: ROXICODONE  Take 1 tablet (10 mg total) by mouth every four (4) hours as needed for up to 5 doses.        CHANGE how you take these medications    predniSONE 10 MG tablet  Commonly known as: DELTASONE  Take 4 tablets (40 mg total) by mouth daily for 7 days, THEN 3 tablets (30 mg total) daily for 7 days, THEN 2 tablets (20 mg total) daily for 7 days, THEN 1 tablet (10 mg total) daily for 7 days.  Start taking on: March 29, 2020  What changed: See the new instructions.        CONTINUE taking these medications    acetaminophen 500 MG tablet  Commonly known as: TYLENOL  Take 500 mg  by mouth every six (6) hours as needed.     fluticasone propionate 50 mcg/actuation nasal spray  Commonly known as: FLONASE  USE 2 SPRAY IN EACH NOSTRIL ONCE DAILY FOR ALLERGIES     folic acid 1 MG tablet  Commonly known as: FOLVITE  Take 1 tablet (1 mg total) by mouth daily.     gabapentin 300 MG capsule  Commonly known as: NEURONTIN  Take 1 capsule (300 mg total) by mouth nightly. Take 2-3 tabs PO qhs for pain     multivitamin capsule  Take 1 capsule by mouth daily.     omeprazole 20 MG capsule  Commonly known as: PriLOSEC  Take 20 mg by mouth daily.     STELARA 90 mg/mL Syrg syringe  Generic drug: ustekinumab  Inject 1 mL (90 mg total) under the skin every 8 weeks.     sulfaSALAzine 500 mg tablet  Commonly known as: AZULFIDINE  Take 2 tablets (1,000 mg total) by mouth four (4) times a day.            Allergies:  Patient has no known allergies.  ______________________________________________________________________  Pending Test Results:      Most Recent Labs:  All lab results last 24 hours -   Recent Results (from the past 24 hour(s))   Magnesium Level    Collection Time: 03/29/20  6:30 AM   Result Value Ref Range    Magnesium 1.8 1.6 - 2.6 mg/dL   CBC    Collection Time: 03/29/20  6:30 AM   Result Value Ref Range    WBC 9.9 3.5 - 10.5 10*9/L    RBC 5.01 4.32 - 5.72 10*12/L    HGB 12.9 (L) 13.5 - 17.5 g/dL    HCT 16.1 09.6 - 04.5 %    MCV 80.2 (L) 81.0 - 95.0 fL    MCH 25.8 (L) 26.0 - 34.0 pg    MCHC 32.1 30.0 - 36.0 g/dL    RDW 40.9 81.1 - 91.4 %    MPV 8.2 7.0 - 10.0 fL    Platelet 318 150 - 450 10*9/L   Comprehensive metabolic panel    Collection Time: 03/29/20  6:30 AM   Result Value Ref Range    Sodium 140 135 - 145 mmol/L    Potassium 3.9 3.4 - 4.5 mmol/L    Chloride 106 98 - 107 mmol/L    Anion Gap 6 5 - 14 mmol/L    CO2 27.6 20.0 - 31.0 mmol/L    BUN 14 9 - 23 mg/dL    Creatinine 7.82 9.56 - 1.10 mg/dL    BUN/Creatinine Ratio 17     EGFR CKD-EPI Non-African American, Male >90 >=60 mL/min/1.56m2    EGFR CKD-EPI African American, Male >90 >=60 mL/min/1.34m2    Glucose 106 70 - 179 mg/dL    Calcium 9.8 8.7 - 21.3 mg/dL    Albumin 3.2 (L) 3.4 - 5.0 g/dL    Total Protein 6.7 5.7 - 8.2 g/dL    Total Bilirubin 0.4 0.3 - 1.2 mg/dL    AST 12 <=08 U/L    ALT 16 10 - 49 U/L    Alkaline Phosphatase 72 46 - 116 U/L   C-reactive protein    Collection Time: 03/29/20  6:30 AM   Result Value Ref Range    CRP 5.0 <=10.0 mg/L       Relevant Studies/Radiology:  CT Abdomen Pelvis with IV Contrast ONLY    Result Date: 03/27/2020  EXAM: CT  ABDOMEN PELVIS W CONTRAST DATE: 03/26/2020 ACCESSION: [id number] UN DICTATED: 03/27/2020 1:38 AM INTERPRETATION LOCATION: [facility] CLINICAL INDICATION: diffuse abd pain ; Abdominal pain, acute, nonlocalized  COMPARISON: No prior available. TECHNIQUE: A spiral CT scan of the abdomen and pelvis was obtained with IV contrast from the lung bases through the pubic symphysis. Images were reconstructed in the axial plane. Coronal and sagittal reformatted images were also provided for further evaluation. FINDINGS: LOWER THORAX: Scattered areas of linear/subsegmental atelectasis are seen throughout both lung fields. Otherwise, the visualized portions of the lower chest are unremarkable. ABDOMEN/PELVIS: HEPATOBILIARY: Ill-defined focus of hypoattenuation along the falciform, likely reflecting focal fatty deposition. Otherwise, no focal hepatic lesion is identified. Gallbladder is unremarkable. No biliary ductal dilatation.  SPLEEN: No focal parenchymal abnormality. PANCREAS: No focal mass or ductal dilatation. ADRENALS: Unremarkable. KIDNEYS/URETERS: Symmetric nephrograms. No enhancing solid renal mass lesion. No evidence of hydronephrosis. BLADDER: Moderately distended with urine, but otherwise unremarkable in appearance. PELVIC/REPRODUCTIVE ORGANS: Unremarkable. Well-corticated calcification posterior to the left seminal vesicle. GI TRACT: No evidence of obstruction. Circumferential fatty deposition within the submucosa of the cecum and ascending colon, which can be seen in the setting of chronic inflammation and patient's reported history of ulcerative colitis. Note is made of mild circumferential wall thickening and mucosal hyperenhancement which extends from the level of the midascending colon through the rectum, which is nonspecific but may be seen in the setting of colitis (e.g. inflammatory given patient's known diagnosis). No significant adjacent inflammatory stranding or focal fluid collection/abscess is identified. The appendix is unremarkable. PERITONEUM/RETROPERITONEUM AND MESENTERY: No intraperitoneal free air or fluid identified. LYMPH NODES: No pathologically enlarged lymph nodes are seen throughout the abdomen or pelvis. VESSELS: The aorta is normal in caliber.  Scattered calcifications of the abdominal aorta and its branch vessels. The portal venous system is patent. The hepatic veins and IVC are unremarkable. BONES AND SOFT TISSUES: Bilateral gynecomastia. No acute osseous abnormality. Multilevel degenerative changes throughout the spine. Well-circumscribed sclerotic focus within the sacrum on the left (2:115), most consistent with a bone island. Fat-containing umbilical hernia.     1.Long segment circumferential wall thickening with associated hyperenhancement extending from the level of the mid-ascending colon through the rectum. While no significant adjacent inflammatory stranding is identified, these findings could potentially be seen in the setting of colitis (e.g. inflammatory given patient's reported history of ulcerative colitis). No intraperitoneal free air or pericolonic fluid collection/abscess identified. 2.Other chronic or incidental findings, as noted above.    ______________________________________________________________________  Discharge Instructions:                Follow Up instructions and Outpatient Referrals     Discharge instructions          Appointments which have been scheduled for you    Jun 13, 2020  1:00 PM  (Arrive by 12:45 PM)  RETURN IBD with [name], MD  [facility] [place] [facility]) [address]  [name] [address]  [phone]           ______________________________________________________________________  Discharge Day Services:  BP 118/78  - Pulse 75  - Temp 36.2 ??C (97.2 ??F) (Temporal)  - Resp 18  - Ht 172.7 cm (5' 8)  - Wt 76.4 kg (168 lb 8 oz)  - SpO2 96%  - BMI 25.62 kg/m??     Pt seen on the day of discharge and determined appropriate for discharge.    Condition at Discharge: good    Length  of Discharge: I spent greater than 30 mins in the discharge of this patient.

## 2020-03-30 NOTE — Unmapped (Signed)
Per Dr Raphael Gibney, increase stelara to 90mg  every 4 weeks due to hospitalization for UC flare

## 2020-04-03 DIAGNOSIS — K5130 Ulcerative (chronic) rectosigmoiditis without complications: Principal | ICD-10-CM

## 2020-04-04 ENCOUNTER — Ambulatory Visit
Admit: 2020-04-04 | Discharge: 2020-04-08 | Disposition: A | Discharge status: Home / Self Care | Payer: PRIVATE HEALTH INSURANCE | Admitting: Internal Medicine

## 2020-04-04 DIAGNOSIS — K51918 Ulcerative colitis, unspecified with other complication: Principal | ICD-10-CM

## 2020-04-04 LAB — CBC W/ AUTO DIFF
BASOPHILS ABSOLUTE COUNT: 0 10*9/L (ref 0.0–0.1)
BASOPHILS RELATIVE PERCENT: 0.6 %
EOSINOPHILS ABSOLUTE COUNT: 0 10*9/L (ref 0.0–0.7)
EOSINOPHILS RELATIVE PERCENT: 0 %
HEMATOCRIT: 43.6 % (ref 38.0–50.0)
HEMOGLOBIN: 14.4 g/dL (ref 13.5–17.5)
LYMPHOCYTES ABSOLUTE COUNT: 0.8 10*9/L (ref 0.7–4.0)
LYMPHOCYTES RELATIVE PERCENT: 10.6 %
MEAN CORPUSCULAR HEMOGLOBIN CONC: 33 g/dL (ref 30.0–36.0)
MEAN CORPUSCULAR HEMOGLOBIN: 26.4 pg (ref 26.0–34.0)
MEAN CORPUSCULAR VOLUME: 79.9 fL — ABNORMAL LOW (ref 81.0–95.0)
MEAN PLATELET VOLUME: 8 fL (ref 7.0–10.0)
MONOCYTES ABSOLUTE COUNT: 0.2 10*9/L (ref 0.1–1.0)
MONOCYTES RELATIVE PERCENT: 2.5 %
NEUTROPHILS ABSOLUTE COUNT: 6.8 10*9/L (ref 1.7–7.7)
NEUTROPHILS RELATIVE PERCENT: 86.3 %
NUCLEATED RED BLOOD CELLS: 0 /100{WBCs} (ref <=4)
PLATELET COUNT: 295 10*9/L (ref 150–450)
RED BLOOD CELL COUNT: 5.46 10*12/L (ref 4.32–5.72)
RED CELL DISTRIBUTION WIDTH: 15.3 % — ABNORMAL HIGH (ref 12.0–15.0)
WBC ADJUSTED: 7.9 10*9/L (ref 3.5–10.5)

## 2020-04-04 LAB — COMPREHENSIVE METABOLIC PANEL
ALBUMIN: 3.8 g/dL (ref 3.4–5.0)
ALKALINE PHOSPHATASE: 67 U/L (ref 46–116)
ALT (SGPT): 20 U/L (ref 10–49)
ANION GAP: 7 mmol/L (ref 5–14)
AST (SGOT): 17 U/L (ref <=34)
BILIRUBIN TOTAL: 0.5 mg/dL (ref 0.3–1.2)
BLOOD UREA NITROGEN: 13 mg/dL (ref 9–23)
BUN / CREAT RATIO: 16
CALCIUM: 9.8 mg/dL (ref 8.7–10.4)
CHLORIDE: 108 mmol/L — ABNORMAL HIGH (ref 98–107)
CO2: 24 mmol/L (ref 20.0–31.0)
CREATININE: 0.82 mg/dL
EGFR CKD-EPI AA MALE: >90 mL/min/{1.73_m2} (ref >=60)
EGFR CKD-EPI NON-AA MALE: >90 mL/min/{1.73_m2} (ref >=60)
GLUCOSE RANDOM: 124 mg/dL (ref 70–179)
POTASSIUM: 4.8 mmol/L — ABNORMAL HIGH (ref 3.4–4.5)
PROTEIN TOTAL: 7.2 g/dL (ref 5.7–8.2)
SODIUM: 139 mmol/L (ref 135–145)

## 2020-04-04 LAB — IRON PANEL
IRON SATURATION (CALC): 33 %
IRON: 85 ug/dL
TOTAL IRON BINDING CAPACITY (CALC): 254.5 mg/dL
TRANSFERRIN: 202 mg/dL — ABNORMAL LOW

## 2020-04-04 LAB — LIPASE: LIPASE: 40 U/L (ref 12–53)

## 2020-04-04 LAB — PHOSPHORUS: PHOSPHORUS: 3.4 mg/dL (ref 2.4–5.1)

## 2020-04-04 LAB — MAGNESIUM: MAGNESIUM: 2.1 mg/dL (ref 1.6–2.6)

## 2020-04-04 LAB — SEDIMENTATION RATE: ERYTHROCYTE SEDIMENTATION RATE: 4 mm/h (ref 0–20)

## 2020-04-04 MED ADMIN — sodium chloride (NS) 0.9 % infusion: 100 mL/h | INTRAVENOUS | @ 23:00:00 | Stop: 2020-04-05

## 2020-04-04 MED ADMIN — lactated ringers bolus 1,000 mL: 1000 mL | INTRAVENOUS | @ 20:00:00 | Stop: 2020-04-04

## 2020-04-04 MED ADMIN — MORPhine 4 mg/mL injection 4 mg: 4 mg | INTRAVENOUS | @ 20:00:00 | Stop: 2020-04-04

## 2020-04-04 MED ADMIN — acetaminophen (TYLENOL) tablet 650 mg: 650 mg | ORAL | @ 22:00:00

## 2020-04-04 MED ADMIN — oxyCODONE (ROXICODONE) immediate release tablet 5 mg: 5 mg | ORAL | @ 22:00:00 | Stop: 2020-04-04

## 2020-04-04 NOTE — Unmapped (Signed)
Per Dr Raphael Gibney, pt switching from stelara to inflixiamb. Pt is currently in ED, will most likely receive first dose of infliximab in the hospital and will then need outpatient authorization.     Remicade TP initiated. Msg sent to Lesly Dukes to expedite auth.

## 2020-04-04 NOTE — Unmapped (Signed)
Pt presents for eval of continued abd pain - pt reports recently discharged (last Wed) for UC and pt continues to have diarrhea and diffuse abd pain. MD instructed pt to return to ED for re-admission.

## 2020-04-04 NOTE — Unmapped (Signed)
[facility] Emergency Department Provider Note      ED Course, Assessment and Plan     Initial Clinical Impression:    April 04, 2020 2:23 PM     Kevin Tran is a 60 y.o. male with a significant PMH of ulcerative colitis (with recent admission 2/13-2/16/22), esophageal dysphagia, pneumothorax, rheumatoid arthritis, and cervical spinal stenosis presenting for evaluation of continued diffuse abdominal pain and profuse diarrhea since recent admission for ulcerative colitis flare.  Patient was discharged feeling well, however several days after discharge developed recurrent left-sided sharp and achy abdominal pain as well as profuse diarrhea that is nonbloody.  Tolerating p.o. without vomiting, however reports every time he eats develops immediate diarrhea.      BP 126/83  - Temp 36.5 ??C (97.7 ??F) (Oral)  - Resp 18  - SpO2 98%       On exam patient is well appearing and in no acute distress. Triage VS are WNL. Normal cardiopulmonary exam. Abdomen soft with diffuse TTP, worse in the left middle and upper quadrants. No rebound or guarding. Exam otherwise unremarkable.      MDM (differential diagnosis, diagnostic testing, treatment, and disposition):    Patient with recent ulcerative colitis flare, discharged however now with recurrent worsening symptoms of abdominal pain and profuse diarrhea.  He had CMV testing obtained via colonoscopy performed during last admission showing severe UC flare, this testing was negative.  Suspicion is that he is having recurrence of his ulcerative colitis given similar prior presentation.  He had a telephone conversation with his ulcerative colitis physician who instructed him to come to the emergency department facilitate admission.  My suspicion is that this is a flareup of his UC.  Less likely infectious given stable vital signs, no fever or chills.  Initially differential includes obstruction but seems less likely given no vomiting tolerating p.o.  No urinary symptoms or back pain to suggest urinary tract infection.  Less likely pancreatitis given no alcohol intake.  Unlikely cholelithiasis given predominantly left-sided abdominal pain.  Plan for CBC, CMP, lipase.  We will plan for admission per his outpatient team's request given persistent symptoms despite his sulfadiazine and and prednisone taper.    Given previous diarrhea we will treat with a liter IV fluids, morphine for pain control will contact for admission.      ED Course:    Labs with no significant derangement, pain slightly improved status post morphine.  Discussed with admitting team and accepted for admission    _____________________________________________________________________    The case was discussed with attending physician who is in agreement with the above assessment and plan    Additional Medical Decision Making     I have reviewed the vital signs and the nursing notes. Diagnostic studies including labs, EKG, and radiology results that were available during my care of the patient were independently reviewed by me and considered in my medical decision making.   I independently visualized the radiology images   I reviewed the patient's prior medical records if available.  Additional history obtained from family if available    History     CHIEF COMPLAINT:   Chief Complaint   Patient presents with   ??? Abdominal Pain       HPI: Kevin Tran is a 60 y.o. male with history of ulcerative colitis, esophageal dysphagia, pneumothorax, rheumatoid arthritis, and cervical spinal stenosis presenting with abdominal pain. Per chart review, patient was admitted from 2/13-2/16/22 for significant abdominal pain in the setting  of an ulcerative colitis exacerbation. Patient's pain improved throughout his stay and he was tolerating PO prior to discharge. He was discharged on a prednisone taper.     At this time, patient reports that the day after he got discharged (4 days ago), his symptoms returned and have been worsening ever since. He specifically reports diffuse abdominal pain and profuse diarrhea. Patient states that he has diarrhea each time he eats, over 15 episodes daily. He states that he called his GI doctor who told him to present to the ED for admission to the hospital. Patient otherwise denies any vomiting, hematochezia, melena, or other symptoms at this time.    PAST MEDICAL HISTORY/PAST SURGICAL HISTORY:   Past Medical History:   Diagnosis Date   ??? Arthritis    ??? Pneumothorax    ??? Ulcerative colitis (CMS-HCC)        Past Surgical History:   Procedure Laterality Date   ??? BACK SURGERY  1996   ??? COLONOSCOPY     ??? PR COLONOSCOPY W/BIOPSY SINGLE/MULTIPLE Left 07/01/2018    Procedure: COLONOSCOPY, FLEXIBLE, PROXIMAL TO SPLENIC FLEXURE; WITH BIOPSY, SINGLE OR MULTIPLE;  Surgeon: [name], MD;  Location: [facility];  Service: Gastroenterology   ??? PR COLONOSCOPY W/BIOPSY SINGLE/MULTIPLE  10/20/2019    Procedure: COLONOSCOPY, FLEXIBLE, PROXIMAL TO SPLENIC FLEXURE; WITH BIOPSY, SINGLE OR MULTIPLE;  Surgeon: Luanne Bras, MD;  Location: [facility] PROCED[facility];  Service: Gastroenterology   ??? PR SIGMOIDOSCOPY,BIOPSY N/A 03/28/2020    Procedure: SIGMOIDOSCOPY, FLEXIBLE; WITH BIOPSY, SINGLE OR MULTIPLE;  Surgeon: Luanne Bras, MD;  [facility] OR [facility];  Service: Gastroenterology       MEDICATIONS:   No current facility-administered medications for this encounter.    Current Outpatient Medications:   ???  acetaminophen (TYLENOL) 500 MG tablet, Take 500 mg by mouth every six (6) hours as needed. , Disp: , Rfl:   ???  fluticasone propionate (FLONASE) 50 mcg/actuation nasal spray, USE 2 SPRAY IN EACH NOSTRIL ONCE DAILY FOR ALLERGIES, Disp: , Rfl:   ???  folic acid (FOLVITE) 1 MG tablet, Take 1 tablet (1 mg total) by mouth daily., Disp: 30 tablet, Rfl: 11  ???  gabapentin (NEURONTIN) 300 MG capsule, Take 1 capsule (300 mg total) by mouth nightly. Take 2-3 tabs PO qhs for pain, Disp: 90 capsule, Rfl: 2  ???  melatonin 3 mg Tab, Take 1 tablet (3 mg total) by mouth nightly as needed., Disp: , Rfl: 0  ???  multivitamin capsule, Take 1 capsule by mouth daily. , Disp: , Rfl:   ???  omeprazole (PRILOSEC) 20 MG capsule, Take 20 mg by mouth daily. , Disp: , Rfl:   ???  oxyCODONE (ROXICODONE) 10 mg immediate release tablet, Take 1 tablet (10 mg total) by mouth every four (4) hours as needed for up to 5 doses., Disp: 5 tablet, Rfl: 0  ???  predniSONE (DELTASONE) 10 MG tablet, Take 4 tablets (40 mg total) by mouth daily for 7 days, THEN 3 tablets (30 mg total) daily for 7 days, THEN 2 tablets (20 mg total) daily for 7 days, THEN 1 tablet (10 mg total) daily for 7 days., Disp: 70 tablet, Rfl: 0  ???  sulfaSALAzine (AZULFIDINE) 500 mg tablet, Take 2 tablets (1,000 mg total) by mouth four (4) times a day., Disp: 240 tablet, Rfl: 1  ???  ustekinumab (STELARA) 90 mg/mL Syrg syringe, Inject 1 mL (90 mg total) under the skin every twenty-eight (28)  days., Disp: 1 mL, Rfl: 5    ALLERGIES:   Patient has no known allergies.    SOCIAL HISTORY:   Social History     Tobacco Use   ??? Smoking status: Former Smoker     Types: Cigarettes   ??? Smokeless tobacco: Never Used   Substance Use Topics   ??? Alcohol use: Yes     Alcohol/week: 2.0 standard drinks     Types: 2 Cans of beer per week       FAMILY HISTORY:  Family History   Problem Relation Age of Onset   ??? Cancer Mother           Review of Systems    A 10 point review of systems was performed and is negative other than positive elements noted in HPI     Constitutional: Negative for fever, chills  Eyes: Negative for visual changes.  ENT: Negative for sore throat.  Cardiovascular: No chest pain or palpitations.  Respiratory: Negative for shortness of breath, cough, or wheezing.  Gastrointestinal: Positive for abdominal pain and diarrhea. Negative for vomiting.  Genitourinary: Negative for dysuria.  Musculoskeletal: Negative for back pain.  Skin: Negative for rash.  Neurological: Negative for headaches, focal weakness or numbness.    Physical Exam     VITAL SIGNS:    BP 126/83  - Temp 36.5 ??C (97.7 ??F) (Oral)  - Resp 18  - SpO2 98%     Constitutional: Alert and oriented. Well appearing and in no distress. No increased respiratory effort.   Eyes: Conjunctivae are normal. PERRL  ENT       Head: Normocephalic and atraumatic.       Nose: No congestion.       Mouth/Throat: Mucous membranes are moist.       Neck: No stridor.  Hematological/Lymphatic/Immunilogical: No cervical lymphadenopathy.  Cardiovascular: S1, S2,  Normal and symmetric distal pulses are present in all extremities.Warm and well perfused.   Respiratory: Normal respiratory effort. Breath sounds are normal.  Gastrointestinal: Abdomen soft with diffuse TTP, worse in the left middle and upper quadrants. No rebound or guarding. There is no CVA tenderness.  Musculoskeletal: Grossly normal range of motion in all extremities.       Right lower leg: No tenderness or edema.       Left lower leg: No tenderness or edema.  Neurologic: Normal speech and language. No gross focal neurologic deficits are appreciated. No gross asymmetrical weakness   Skin: Skin is warm, dry and intact. No rash noted.  Psychiatric: Mood and affect are normal. Speech and behavior are normal.    Radiology     No orders to display       Labs     Labs Reviewed   COMPREHENSIVE METABOLIC PANEL - Abnormal; Notable for the following components:       Result Value    Potassium 4.8 (*)     Chloride 108 (*)     All other components within normal limits   CBC W/ AUTO DIFF - Abnormal; Notable for the following components:    MCV 79.9 (*)     RDW 15.3 (*)     All other components within normal limits   LIPASE - Normal   COVID-19 PCR   CBC W/ DIFFERENTIAL    Narrative:     The following orders were created for panel order CBC w/ Differential.                  Procedure  Abnormality         Status                                     --------- -----------         ------                                     CBC w/ Differential[[id number]]         Abnormal            Final result                                                 Please view results for these tests on the individual orders.         Pertinent labs & imaging results that were available during my care of the patient were reviewed by me and considered in my medical decision making (see chart for details).    Please note- This chart has been created using [employer]. Chart creation errors have been sought, but may not always be located and such creation errors, especially pronoun confusion, do NOT reflect on the standard of medical care.      Leta Speller, MD  ___________________________  Documentation assistance was provided by Lacey Jensen, Scribe, on April 04, 2020 at 2:56 PM for Leta Speller, MD.    Documentation assistance was provided by the scribe in my presence.  The documentation recorded by the scribe has been reviewed by me and accurately reflects the services I personally performed.           Leta Speller, MD  Resident  04/04/20 1758

## 2020-04-05 LAB — CBC
HEMATOCRIT: 37.6 % — ABNORMAL LOW (ref 38.0–50.0)
HEMOGLOBIN: 11.9 g/dL — ABNORMAL LOW (ref 13.5–17.5)
MEAN CORPUSCULAR HEMOGLOBIN CONC: 31.6 g/dL (ref 30.0–36.0)
MEAN CORPUSCULAR HEMOGLOBIN: 25.6 pg — ABNORMAL LOW (ref 26.0–34.0)
MEAN CORPUSCULAR VOLUME: 81.1 fL (ref 81.0–95.0)
MEAN PLATELET VOLUME: 8.2 fL (ref 7.0–10.0)
PLATELET COUNT: 238 10*9/L (ref 150–450)
RED BLOOD CELL COUNT: 4.64 10*12/L (ref 4.32–5.72)
RED CELL DISTRIBUTION WIDTH: 15.4 % — ABNORMAL HIGH (ref 12.0–15.0)
WBC ADJUSTED: 11.4 10*9/L — ABNORMAL HIGH (ref 3.5–10.5)

## 2020-04-05 LAB — BASIC METABOLIC PANEL
ANION GAP: 8 mmol/L (ref 5–14)
BLOOD UREA NITROGEN: 13 mg/dL (ref 9–23)
BUN / CREAT RATIO: 15
CALCIUM: 9.3 mg/dL (ref 8.7–10.4)
CHLORIDE: 106 mmol/L (ref 98–107)
CO2: 25.9 mmol/L (ref 20.0–31.0)
CREATININE: 0.87 mg/dL
EGFR CKD-EPI AA MALE: >90 mL/min/{1.73_m2} (ref >=60)
EGFR CKD-EPI NON-AA MALE: >90 mL/min/{1.73_m2} (ref >=60)
GLUCOSE RANDOM: 90 mg/dL (ref 70–179)
POTASSIUM: 4 mmol/L (ref 3.4–4.5)
SODIUM: 140 mmol/L (ref 135–145)

## 2020-04-05 LAB — ADDON DIFFERENTIAL ONLY
BASOPHILS ABSOLUTE COUNT: 0.1 10*9/L (ref 0.0–0.1)
BASOPHILS RELATIVE PERCENT: 1 %
EOSINOPHILS ABSOLUTE COUNT: 0.1 10*9/L (ref 0.0–0.7)
EOSINOPHILS RELATIVE PERCENT: 1.3 %
LYMPHOCYTES ABSOLUTE COUNT: 2.9 10*9/L (ref 0.7–4.0)
LYMPHOCYTES RELATIVE PERCENT: 25.4 %
MONOCYTES ABSOLUTE COUNT: 1.1 10*9/L — ABNORMAL HIGH (ref 0.1–1.0)
MONOCYTES RELATIVE PERCENT: 9.5 %
NEUTROPHILS ABSOLUTE COUNT: 7.2 10*9/L (ref 1.7–7.7)
NEUTROPHILS RELATIVE PERCENT: 62.8 %

## 2020-04-05 LAB — C-REACTIVE PROTEIN: C-REACTIVE PROTEIN: 5 mg/L (ref <=10.0)

## 2020-04-05 MED ADMIN — enoxaparin (LOVENOX) syringe 40 mg: 40 mg | SUBCUTANEOUS | @ 01:00:00

## 2020-04-05 MED ADMIN — acetaminophen (TYLENOL) tablet 650 mg: 650 mg | ORAL | @ 11:00:00

## 2020-04-05 MED ADMIN — oxyCODONE (ROXICODONE) immediate release tablet 10 mg: 10 mg | ORAL | @ 04:00:00 | Stop: 2020-04-18

## 2020-04-05 MED ADMIN — methylPREDNISolone sodium succinate (PF) (Solu-MEDROL) injection 20 mg: 20 mg | INTRAVENOUS | @ 18:00:00

## 2020-04-05 MED ADMIN — oxyCODONE (ROXICODONE) immediate release tablet 10 mg: 10 mg | ORAL | @ 14:00:00 | Stop: 2020-04-18

## 2020-04-05 MED ADMIN — acetaminophen (TYLENOL) tablet 650 mg: 650 mg | ORAL | @ 19:00:00

## 2020-04-05 MED ADMIN — sulfaSALAzine (AZULFIDINE) tablet 1,000 mg: 1000 mg | ORAL | @ 22:00:00

## 2020-04-05 MED ADMIN — hydrocortisone 1 % cream: TOPICAL | @ 18:00:00

## 2020-04-05 MED ADMIN — sulfaSALAzine (AZULFIDINE) tablet 1,000 mg: 1000 mg | ORAL | @ 17:00:00

## 2020-04-05 MED ADMIN — melatonin tablet 6 mg: 6 mg | ORAL | @ 04:00:00

## 2020-04-05 MED ADMIN — pantoprazole (PROTONIX) EC tablet 20 mg: 20 mg | ORAL | @ 14:00:00

## 2020-04-05 MED ADMIN — sulfaSALAzine (AZULFIDINE) tablet 1,000 mg: 1000 mg | ORAL | @ 01:00:00

## 2020-04-05 MED ADMIN — pregabalin (LYRICA) capsule 225 mg: 225 mg | ORAL | @ 14:00:00

## 2020-04-05 MED ADMIN — pregabalin (LYRICA) capsule 225 mg: 225 mg | ORAL | @ 01:00:00

## 2020-04-05 MED ADMIN — folic acid (FOLVITE) tablet 1 mg: 1 mg | ORAL | @ 14:00:00

## 2020-04-05 MED ADMIN — predniSONE (DELTASONE) tablet 40 mg: 40 mg | ORAL | @ 14:00:00 | Stop: 2020-04-05

## 2020-04-05 MED ADMIN — multivitamins, therapeutic with minerals tablet 1 tablet: 1 | ORAL | @ 14:00:00

## 2020-04-05 MED ADMIN — sulfaSALAzine (AZULFIDINE) tablet 1,000 mg: 1000 mg | ORAL | @ 11:00:00

## 2020-04-05 NOTE — Unmapped (Signed)
General Medicine History and Physical    Assessment/Plan:    Principal Problem:    Exacerbation of ulcerative colitis without complication (CMS-HCC)      Kevin Tran is a 60 y.o. male with PMHx as described below that presented to [facility] with Exacerbation of ulcerative colitis without complication (CMS-HCC).    Recurrent exacerbation of Ulcerative colitis:  Recent admission as described below. Treated with 20mg  Q8H solumedrol during admission. Had symptomatic improvement with this regimen but has had recurrence since transition to oral prednisone 40mg  daily. Received Stelara last week. GI with plans to initiate Infliximab this admission.  - admit to inpatient  - addition 1L NS over 10 hours  - pain control with PRN acetaminophen, oxycodone 10mg , morphine 6mg  per pain scale  - GI contacted, requested c diff PCR; decision re: steroids TBD, will keep at 40mg  daily for now  - regular diet    Rheumatoid Arthritis:   Not taking methotrexate due to concerns for side effects. His joint pain has been under control, likely due to recent steroid taper.   - CTM  ??  Esophageal Dysphagia: Protonix as above  ??  Back Pain: Lyrica daily and gabapentin nightly   ??  Code Status:  Full code, wife Enoc Getter would be his [facility] [phone].     DVT prophylaxis: Enoxaparin given increased risk of VTE in IBD patients      ___________________________________________________________________    Chief Complaint:  Chief Complaint   Patient presents with   ??? Abdominal Pain     Exacerbation of ulcerative colitis without complication (CMS-HCC)    HPI:  Kevin Tran is a 60 y.o. male with ulcerative colitis, rheumatoid arthritis, and recent admission to [facility] for UC flare treated with IV steroids and Sulfasalazine that presented to [facility] with recurrent abdominal pain and diarrhea consistent with recurrent Exacerbation of ulcerative colitis without complication (CMS-HCC).    Patient of Dr. Raphael Gibney; recently admitted to [facility] from 02/13 - 02/16 for UC flare. C. Diff negative. Treated with IV steroids with improvement. Had resolution of diarrhea and abdominal pain controlled with tylenol prior to discharge. Flexible sigmoidoscopy during admission showed moderately active ulcerative colitis. Pathology showed severe chronic active colitis w/o e/o viral cytopathic effect - immunohistochemical stain for CMV pending. He was discharged on a steroid taper and has been adherent to 40mg  daily (including dose this AM) as well as sulfasalazine QID. He had worsening of left sided abdominal pain as well as diarrhea starting about 48 hours after discharge. He notes worsening of both after oral intake. Abdominal pain improved after defecation. He ran out of the prescribed 10mg  of oxycodone after a few days (only received 5 tablets). He estimates 10-15 watery BMs in a 24 hour period. No blood. No nausea/vomiting. No fever or presyncope. Feels weak but no falls. He took his Stelara last week (takes every 4 weeks). He called his GI physician and was told that he would need to be admitted with plan to initiate Infliximab.    In the ED. VS were within normal limits as was lab work. He received 1L LR and 4mg  IV morphine. He notes that his abdominal pain responded to this medication moderately (8 --> 5) but has since worsened.      Allergies:  Patient has no known allergies.    Medications:   Prior to Admission medications    Medication Dose, Route, Frequency   acetaminophen (TYLENOL) 500 MG tablet 500 mg, Oral, Every 6 hours PRN  fluticasone propionate (FLONASE) 50 mcg/actuation nasal spray USE 2 SPRAY IN EACH NOSTRIL ONCE DAILY FOR ALLERGIES   folic acid (FOLVITE) 1 MG tablet 1 mg, Oral, Daily (standard)   gabapentin (NEURONTIN) 300 MG capsule 300 mg, Oral, Nightly, Take 2-3 tabs PO qhs for pain  Patient taking differently: Take 300 mg by mouth two (2) times a day.    melatonin 3 mg Tab 3 mg, Oral, Nightly PRN   multivitamin capsule 1 capsule, Oral, Daily (standard) omeprazole (PRILOSEC) 20 MG capsule 20 mg, Oral, Daily (standard)   oxyCODONE (ROXICODONE) 10 mg immediate release tablet 10 mg, Oral, Every 4 hours PRN   predniSONE (DELTASONE) 10 MG tablet Take 4 tablets (40 mg total) by mouth daily for 7 days, THEN 3 tablets (30 mg total) daily for 7 days, THEN 2 tablets (20 mg total) daily for 7 days, THEN 1 tablet (10 mg total) daily for 7 days.   sulfaSALAzine (AZULFIDINE) 500 mg tablet 1,000 mg, Oral, 4 times a day   ustekinumab (STELARA) 90 mg/mL Syrg syringe 90 mg, Subcutaneous, Every 28 days       Medical History:  Past Medical History:   Diagnosis Date   ??? Arthritis    ??? Pneumothorax    ??? Ulcerative colitis (CMS-HCC)        Surgical History:  Past Surgical History:   Procedure Laterality Date   ??? BACK SURGERY  1996   ??? COLONOSCOPY     ??? PR COLONOSCOPY W/BIOPSY SINGLE/MULTIPLE Left 07/01/2018    Procedure: COLONOSCOPY, FLEXIBLE, PROXIMAL TO SPLENIC FLEXURE; WITH BIOPSY, SINGLE OR MULTIPLE;  Surgeon: [name], MD;  Location: [facility];  Service: Gastroenterology   ??? PR COLONOSCOPY W/BIOPSY SINGLE/MULTIPLE  10/20/2019    Procedure: COLONOSCOPY, FLEXIBLE, PROXIMAL TO SPLENIC FLEXURE; WITH BIOPSY, SINGLE OR MULTIPLE;  Surgeon: Luanne Bras, MD;  Location: [facility];  Service: Gastroenterology   ??? PR SIGMOIDOSCOPY,BIOPSY N/A 03/28/2020    Procedure: SIGMOIDOSCOPY, FLEXIBLE; WITH BIOPSY, SINGLE OR MULTIPLE;  Surgeon: Luanne Bras, MD;  Location: [facility];  Service: Gastroenterology       Social History:  Social History     Socioeconomic History   ??? Marital status: Married     Spouse name: Not on file   ??? Number of children: Not on file   ??? Years of education: Not on file   ??? Highest education level: Not on file   Occupational History   ??? Not on file   Tobacco Use   ??? Smoking status: Former Smoker     Types: Cigarettes   ??? Smokeless tobacco: Never Used   Vaping Use   ??? Vaping Use: Never used   Substance and Sexual Activity   ??? Alcohol use: Yes     Alcohol/week: 2.0 standard drinks     Types: 2 Cans of beer per week   ??? Drug use: No   ??? Sexual activity: Yes     Partners: Female   Other Topics Concern   ??? Not on file   Social History Narrative   ??? Not on file     Social Determinants of Health     Financial Resource Strain: Not on file   Food Insecurity: Not on file   Transportation Needs: Not on file   Physical Activity: Not on file   Stress: Not on file   Social Connections: Not on file     Alcohol Use: Not At Risk   ???  How often do you have a drink containing alcohol?: Monthly or less   ??? How many drinks containing alcohol do you have on a typical day when you are drinking?: Not on file   ??? How often do you have 5 or more drinks on one occasion?: Not on file       Family History:  Family History   Problem Relation Age of Onset   ??? Cancer Mother        Review of Systems:  10 systems reviewed and are negative unless otherwise mentioned in HPI    Labs/Studies:  Labs and Studies from the last 24hrs per EMR and Reviewed    Hgb 14, WBC 8, CRP 7.2, Cr 0.8; COVID PCR negative    Physical Exam:  Temp:  [35.6 ??C (96 ??F)-36.5 ??C (97.7 ??F)] 35.6 ??C (96 ??F)  Heart Rate:  [65] 65  SpO2 Pulse:  [83] 83  Resp:  [18] 18  BP: (126-130)/(79-83) 130/79  SpO2:  [96 %-98 %] 96 %    GEN: well appearing, NAD, lying in bed  EYES: anicteric sclerae   ENT: dry MM, OP clear   Neck: no JVD  CV: normal rate, regular rhythm, no murmurs   PULM: CTAB, good air entry, normal work of breathing  ABD: soft, +BS, moderately TTP with voluntary guarding, no rebound; ND  EXT: warm, no edema, symmetric pulses  SKIN: no rashes  NEURO: No focal deficits  PSYCH: A+Ox3, appropriate affect  MSK: No deformities or spinal tenderness

## 2020-04-05 NOTE — Unmapped (Signed)
Minimal discharge needs have been identified for this patient.  Anticipated needs are: None.  CM will continue to follow for avoidable delays and opportunities for progression of care.   04/05/2020 1:12 PM         Care Management  Initial Transition Planning Assessment              General  Care Manager assessed the patient by : Medical record review,Discussion with Clinical Care team    Contact/Decision Maker  Extended Emergency Contact Information  Primary Emergency Contact: Laseter,Robin  Address: [address]           [place], [name] [address] [place]  Home Phone: [phone]  Mobile Phone: [phone]  Relation: Spouse    Legal Next of Kin / Guardian / POA / Advance Directives       Advance Directive (Medical Treatment)  Does patient have an advance directive covering medical treatment?: Patient does not have advance directive covering medical treatment.  Reason patient does not have an advance directive covering medical treatment:: Patient does not wish to complete one at this time.    Health Care Decision Maker [HCDM] (Medical & Mental Health Treatment)  Healthcare Decision Maker: HCDM documented in the HCDM/Contact Info section.  Information offered on HCDM, Medical & Mental Health advance directives:: Patient declined information.         Patient Information  Lives with: Spouse/significant other    Type of Residence: Private residence [address] [name] [address]      Support Systems/Concerns: Family Members         Home Care services in place prior to admission?: No    Financial Information       Need for financial assistance?: No       Social Determinants of Health  Social Determinants of Health were addressed in provider documentation.  Please refer to patient history.  Social Determinants of Health     Tobacco Use: Medium Risk   ??? Smoking Tobacco Use: Former Smoker   ??? Smokeless Tobacco Use: Never Used   Alcohol Use: Not At Risk   ??? How often do you have a drink containing alcohol?: Monthly or less   ??? How many drinks containing alcohol do you have on a typical day when you are drinking?: Not on file   ??? How often do you have 5 or more drinks on one occasion?: Not on file   Financial Resource Strain: Not on file   Food Insecurity: Not on file   Transportation Needs: Not on file   Physical Activity: Not on file   Stress: Not on file   Social Connections: Not on file   Intimate Partner Violence: Not on file   Depression: Not on file   Housing/Utilities: Not on file   Substance Use: Not on file   Health Literacy: Not on file       Discharge Needs Assessment  Concerns to be Addressed: no discharge needs identified,denies needs/concerns at this time    Clinical Risk Factors: > 65,Multiple Diagnoses (Chronic)    Barriers to taking medications: No    Prior overnight hospital stay or ED visit in last 90 days: Yes    Readmission Within the Last 30 Days: previous discharge plan unsuccessful         Anticipated Changes Related to Illness: none    Equipment Needed After Discharge: none    Discharge Facility/Level of Care Needs:  No anticipated dc needs  Readmission  Risk of Unplanned Readmission Score: UNPLANNED READMISSION SCORE: 14%  Predictive Model Details          14% (Medium)  Factor Value    Calculated 04/05/2020 12:05 33% Number of active Rx orders 40    [place] Risk of Unplanned Readmission Model 13% Number of ED visits in last six months 2     8% Imaging order present in last 6 months     7% Latest hemoglobin low (11.9 g/dL)     7% Phosphorous result present     6% Number of hospitalizations in last year 1     6% Age 60     5% Active anticoagulant Rx order present     5% Active corticosteroid Rx order present     4% Charlson Comorbidity Index 3     2% Future appointment scheduled     1% Active ulcer medication Rx order present     1% Current length of stay 0.807 days      Readmitted Within the Last 30 Days? (No if blank) Yes  Patient at risk for readmission?: Yes    Discharge Plan  Screen findings are: Care Manager reviewed the plan of the patient's care with the Multidisciplinary Team. No discharge planning needs identified at this time. Care Manager will continue to manage plan and monitor patient's progress with the team.    Expected Discharge Date: 04/06/20    Expected Transfer from Critical Care:  N/A    Quality data for continuing care services shared with patient and/or representative?: N/A  Patient and/or family were provided with choice of facilities / services that are available and appropriate to meet post hospital care needs?: N/A       Initial Assessment complete?: Yes     Kevin Tran  April 05, 2020 1:12 PM

## 2020-04-05 NOTE — Unmapped (Addendum)
Hospital Medicine Daily Progress Note    Assessment/Plan:    Principal Problem:    Exacerbation of ulcerative colitis without complication (CMS-HCC)  Resolved Problems:    * No resolved hospital problems. *                 Kevin Tran is a 60 y.o. male that presented to [facility] with Exacerbation of ulcerative colitis without complication (CMS-HCC).    Recurrent exacerbation of Ulcerative colitis:  Recent admission for same, dc'd 2/16. Treated with 20mg  Q8H solumedrol during admission. Had symptomatic improvement with this regimen but has had recurrence since transition to oral prednisone 40mg  daily. Received Stelara last week. GI with plans to initiate Infliximab this admission if c diff negative.  - addition 1L NS over 10 hours  - pain control with PRN acetaminophen, oxycodone 10mg , morphine 6mg  per pain scale  - GI consult appreciated, decision re: steroids TBD, will keep at 40mg  PO daily for now  -C diff negative  - regular diet  ??  Rheumatoid Arthritis:??  Not taking??methotrexate??due to concerns for side effects. His joint pain has been under control, likely due to recent steroid taper.   - CTM  ??  Esophageal Dysphagia:??Protonix as above  ??  Back Pain: Lyrica daily and gabapentin nightly??  ??  Code Status:  Full code, wife Kevin Tran would be his HDCM??[phone].??  ??  DVT prophylaxis: Enoxaparin given increased risk of VTE in IBD patients        ___________________________________________________________________    Subjective:  No acute events overnight. Had dinner and developed abdo pain and cramping shortly after. Pain managed with current regimen. Drinking well. Wants to get back to IV steroids, waiting for c diff results    General ROS: negative    Labs/Studies:  Labs and Studies from the last 24hrs per EMR and Reviewed    Objective:  Temp:  [35.6 ??C (96 ??F)-36.5 ??C (97.7 ??F)] 36 ??C (96.8 ??F)  Heart Rate:  [65-82] 82  SpO2 Pulse:  [83] 83  Resp:  [18] 18  BP: (105-130)/(58-83) 105/58  SpO2:  [92 %-98 %] 92 %    GEN: NAD, thin gentleman, lying in bed  EYES: EOMI  ENT: MMM  CV: RRR  PULM: CTA B  ABD: soft, NT/ND, hyperactive BS  EXT: No edema

## 2020-04-05 NOTE — Unmapped (Signed)
VSS. Pt continues to have loose BM since eating dinner. Stool sample sent to lab tonight. IVF stopped per MD order. MD on call paged to see if they wanted them to stop or continue. PRN pain meds given x1 per pt request. No reports of any fall/injury, will continue to monitor.   Problem: Adult Inpatient Plan of Care  Goal: Plan of Care Review  Outcome: Progressing     Problem: Infection  Goal: Absence of Infection Signs and Symptoms  Outcome: Progressing

## 2020-04-05 NOTE — Unmapped (Signed)
[facility] Gastroenterology Consult Service   Initial Consultation         Assessment and Recommendations:   Kevin Tran is a 60 y.o. male with a PMHx of left-sided ulcerative colitis currently on prednisone/ustekinumab, RA that presented to [facility] with recurrence of profuse watery diarrhea and abdominal pain. Pt is seen in consultation at the request of Kevin Pennie Banter, PA (Med Undesignated (MDX)) for ulcerative colitis.    Principal Problem:    Exacerbation of ulcerative colitis without complication (CMS-HCC)  Resolved Problems:    * No resolved hospital problems. *    Acute Flare of Left-Sided Ulcerative Colitis: Patient presenting with ongoing abdominal pain (primarily in the LLQ) along with copious watery diarrhea. No BRBPR or melena. Recently seen by [name] and flexible sigmoidoscopy showed Mayo 2 UC throughout the examined colon (up through descending). C difficile has been ruled-out at this point. Spoke with Dr. Raphael Gibney, plan to convert back to IV steroids and transition from ustekinmumab to infliximab therapy. I have spoke with the Inpatient Pharmacy team at [facility] and they will coordinate getting infliximab delivered from [facility] (if this ends up not being possible --> transfer would become necessary).   -- Stop prednisone  -- Start methylprednisolone 20mg  IV TID  -- I have ordered infliximab 5mg /kg, spoke with Pharmacy and they will coordinate getting the medication from [facility] --> likely to be available tomorrow  -- Consider Canasa, Rowasa, or Proctofoam if patient interested (he seemed to not be); may help control fecal urgency symptoms faster than steroids/infliximab will   -- Trend at least CBC, BMP, albumin, and CRP daily  -- Check [id number]  -- Agree with ordered pharmacologic DVT prophylaxis  -- Patient was provided with a stool diary to record bowel movements    Thank you for involving [name] in the care of your patient. We will continue to follow along with you. This patient was seen and examined with Dr. Andres Shad as well as discussed with Dr. Raphael Gibney.     For questions, please contact the on-call fellow for the [facility] Gastroenterology Consult Service at [phone] (available 8AM-5PM only, excluding weekends and holidays).     Reason for Consultation:   Pt was seen in consultation at the request of Kevin Pennie Banter, PA (Med Undesignated (MDX)) for ulcerative colitis.    Subjective:   HPI:  Kevin Tran is a 60 y.o. male with PMHx of left-sided ulcerative colitis currently on prednisone/ustekinumab, RA that presented to [facility] with recurrence of profuse watery diarrhea and abdominal pain.    The patient reports he was doing okay when he left the hospital last week, but then his symptoms started back within 24 hours of discharge. He reports crampy to sharp left-sided (primarily LLQ/suprapubic area) abdominal pain. He is also having profuse, watery diarrhea. He denies seeing any blood or melena. No incontinence episodes. He reports every time he eats something he immediately has diarrhea. He was having >8 episodes per day. He has had some minor nausea, no vomiting. Has an appetite, but hesitant to eat because it causes him to have a bowel movement so quickly (within 30 minutes). Had previously been on (?) Canasa and proctofoam at home with minimal results.     Allergies:  Patient has no known allergies.    Medications:   Prior to Admission medications    Medication Dose, Route, Frequency   acetaminophen (TYLENOL) 500 MG tablet 500 mg, Oral, Every 6 hours PRN   fluticasone propionate (FLONASE) 50  mcg/actuation nasal spray USE 2 SPRAY IN EACH NOSTRIL ONCE DAILY FOR ALLERGIES   folic acid (FOLVITE) 1 MG tablet 1 mg, Oral, Daily (standard)   melatonin 3 mg Tab 3 mg, Oral, Nightly PRN   multivitamin capsule 1 capsule, Oral, Daily (standard)   omeprazole (PRILOSEC) 20 MG capsule 20 mg, Oral, Daily (standard)   oxyCODONE (ROXICODONE) 10 mg immediate release tablet 10 mg, Oral, Every 4 hours PRN   predniSONE (DELTASONE) 10 MG tablet Take 4 tablets (40 mg total) by mouth daily for 7 days, THEN 3 tablets (30 mg total) daily for 7 days, THEN 2 tablets (20 mg total) daily for 7 days, THEN 1 tablet (10 mg total) daily for 7 days.   pregabalin (LYRICA) 225 MG capsule 225 mg, Oral, 2 times a day (standard)   sulfaSALAzine (AZULFIDINE) 500 mg tablet 1,000 mg, Oral, 4 times a day   ustekinumab (STELARA) 90 mg/mL Syrg syringe 90 mg, Subcutaneous, Every 28 days     Medical History:  Past Medical History:   Diagnosis Date   ??? Arthritis    ??? Pneumothorax    ??? Ulcerative colitis (CMS-HCC)      Surgical History:  Past Surgical History:   Procedure Laterality Date   ??? BACK SURGERY  1996   ??? COLONOSCOPY     ??? PR COLONOSCOPY W/BIOPSY SINGLE/MULTIPLE Left 07/01/2018    Procedure: COLONOSCOPY, FLEXIBLE, PROXIMAL TO SPLENIC FLEXURE; WITH BIOPSY, SINGLE OR MULTIPLE;  Surgeon: [name], MD;  Location: [facility];  Service: Gastroenterology   ??? PR COLONOSCOPY W/BIOPSY SINGLE/MULTIPLE  10/20/2019    Procedure: COLONOSCOPY, FLEXIBLE, PROXIMAL TO SPLENIC FLEXURE; WITH BIOPSY, SINGLE OR MULTIPLE;  Surgeon: Luanne Bras, MD;  Location: [facility];  Service: Gastroenterology   ??? PR SIGMOIDOSCOPY,BIOPSY N/A 03/28/2020    Procedure: SIGMOIDOSCOPY, FLEXIBLE; WITH BIOPSY, SINGLE OR MULTIPLE;  Surgeon: Luanne Bras, MD;  [facility]: [facility];  Service: Gastroenterology     Social History:  Social History     Tobacco Use   ??? Smoking status: Former Smoker     Types: Cigarettes   ??? Smokeless tobacco: Never Used   Vaping Use   ??? Vaping Use: Never used   Substance Use Topics   ??? Alcohol use: Yes     Alcohol/week: 2.0 standard drinks     Types: 2 Cans of beer per week   ??? Drug use: No     Family History:  Family History   Problem Relation Age of Onset   ??? Cancer Mother      Review of Systems:  10 systems were reviewed and are negative unless otherwise mentioned in the HPI    Objective:   Physical Exam:  Temp:  [35.6 ??C-36.1 ??C] 36 ??C  Heart Rate:  [65-82] 82  Resp:  [18] 18  BP: (105-130)/(58-79) 105/58  SpO2:  [92 %-97 %] 92 %    Gen: WDWN male in NAD, answers questions appropriately  Eyes: Sclera anicteric, EOMI  HENT: atraumatic, normocephalic, MMM   Heart: RRR, S1, S2, no M/R/G  Lungs: CTAB, no crackles or wheezes, no use of accessory muscles  Abdomen: Normoactive bowel sounds, soft, LLQ tenderness, non-distended, guards in LLQ  Extremities: no clubbing, cyanosis, or edema in the BLEs  Neuro: Normal speech. No focal deficits.  Skin:  No rashes, lesions on clothed exam  Psych: Alert, normal mood and affect.     Labs/Studies:  Labs, studies, and imaging from the last  24hrs per EMR and personally reviewed.    I have reviewed and summarized prior records and discussed care with hospital medicine team, primary gastroenterologist.

## 2020-04-06 DIAGNOSIS — K51918 Ulcerative colitis, unspecified with other complication: Principal | ICD-10-CM

## 2020-04-06 LAB — CBC W/ AUTO DIFF
BASOPHILS ABSOLUTE COUNT: 0 10*9/L (ref 0.0–0.1)
BASOPHILS RELATIVE PERCENT: 0.2 %
EOSINOPHILS ABSOLUTE COUNT: 0 10*9/L (ref 0.0–0.7)
EOSINOPHILS RELATIVE PERCENT: 0 %
HEMATOCRIT: 39.3 % (ref 38.0–50.0)
HEMOGLOBIN: 12.4 g/dL — ABNORMAL LOW (ref 13.5–17.5)
LYMPHOCYTES ABSOLUTE COUNT: 1.5 10*9/L (ref 0.7–4.0)
LYMPHOCYTES RELATIVE PERCENT: 13.2 %
MEAN CORPUSCULAR HEMOGLOBIN CONC: 31.6 g/dL (ref 30.0–36.0)
MEAN CORPUSCULAR HEMOGLOBIN: 25.6 pg — ABNORMAL LOW (ref 26.0–34.0)
MEAN CORPUSCULAR VOLUME: 81 fL (ref 81.0–95.0)
MEAN PLATELET VOLUME: 8.4 fL (ref 7.0–10.0)
MONOCYTES ABSOLUTE COUNT: 1 10*9/L (ref 0.1–1.0)
MONOCYTES RELATIVE PERCENT: 8.5 %
NEUTROPHILS ABSOLUTE COUNT: 8.9 10*9/L — ABNORMAL HIGH (ref 1.7–7.7)
NEUTROPHILS RELATIVE PERCENT: 78.1 %
NUCLEATED RED BLOOD CELLS: 0 /100{WBCs} (ref <=4)
PLATELET COUNT: 271 10*9/L (ref 150–450)
RED BLOOD CELL COUNT: 4.85 10*12/L (ref 4.32–5.72)
RED CELL DISTRIBUTION WIDTH: 15.1 % — ABNORMAL HIGH (ref 12.0–15.0)
WBC ADJUSTED: 11.3 10*9/L — ABNORMAL HIGH (ref 3.5–10.5)

## 2020-04-06 LAB — BASIC METABOLIC PANEL
ANION GAP: 8 mmol/L (ref 5–14)
BLOOD UREA NITROGEN: 12 mg/dL (ref 9–23)
BUN / CREAT RATIO: 16
CALCIUM: 9.6 mg/dL (ref 8.7–10.4)
CHLORIDE: 105 mmol/L (ref 98–107)
CO2: 25.8 mmol/L (ref 20.0–31.0)
CREATININE: 0.73 mg/dL
EGFR CKD-EPI AA MALE: >90 mL/min/{1.73_m2} (ref >=60)
EGFR CKD-EPI NON-AA MALE: >90 mL/min/{1.73_m2} (ref >=60)
GLUCOSE RANDOM: 165 mg/dL (ref 70–179)
POTASSIUM: 3.8 mmol/L (ref 3.4–4.5)
SODIUM: 139 mmol/L (ref 135–145)

## 2020-04-06 LAB — ALBUMIN: ALBUMIN: 3.1 g/dL — ABNORMAL LOW (ref 3.4–5.0)

## 2020-04-06 LAB — C-REACTIVE PROTEIN: C-REACTIVE PROTEIN: 11 mg/L — ABNORMAL HIGH (ref <=10.0)

## 2020-04-06 MED ADMIN — methylPREDNISolone sodium succinate (PF) (Solu-MEDROL) injection 20 mg: 20 mg | INTRAVENOUS | @ 22:00:00

## 2020-04-06 MED ADMIN — melatonin tablet 6 mg: 6 mg | ORAL | @ 03:00:00

## 2020-04-06 MED ADMIN — enoxaparin (LOVENOX) syringe 40 mg: 40 mg | SUBCUTANEOUS | @ 16:00:00

## 2020-04-06 MED ADMIN — sulfaSALAzine (AZULFIDINE) tablet 1,000 mg: 1000 mg | ORAL | @ 18:00:00

## 2020-04-06 MED ADMIN — pregabalin (LYRICA) capsule 225 mg: 225 mg | ORAL | @ 03:00:00

## 2020-04-06 MED ADMIN — methylPREDNISolone sodium succinate (PF) (Solu-MEDROL) injection 20 mg: 20 mg | INTRAVENOUS | @ 03:00:00

## 2020-04-06 MED ADMIN — methylPREDNISolone sodium succinate (PF) (Solu-MEDROL) injection 20 mg: 20 mg | INTRAVENOUS | @ 16:00:00

## 2020-04-06 MED ADMIN — methylPREDNISolone sodium succinate (PF) (Solu-MEDROL) injection 20 mg: 20 mg | INTRAVENOUS | @ 16:00:00 | Stop: 2020-04-06

## 2020-04-06 MED ADMIN — acetaminophen (TYLENOL) tablet 650 mg: 650 mg | ORAL | @ 16:00:00 | Stop: 2020-04-06

## 2020-04-06 MED ADMIN — sulfaSALAzine (AZULFIDINE) tablet 1,000 mg: 1000 mg | ORAL | @ 03:00:00

## 2020-04-06 MED ADMIN — acetaminophen (TYLENOL) tablet 650 mg: 650 mg | ORAL | @ 11:00:00

## 2020-04-06 MED ADMIN — folic acid (FOLVITE) tablet 1 mg: 1 mg | ORAL | @ 16:00:00

## 2020-04-06 MED ADMIN — multivitamins, therapeutic with minerals tablet 1 tablet: 1 | ORAL | @ 16:00:00

## 2020-04-06 MED ADMIN — diphenhydrAMINE (BENADRYL) capsule/tablet 25 mg: 25 mg | ORAL | @ 16:00:00 | Stop: 2020-04-06

## 2020-04-06 MED ADMIN — sulfaSALAzine (AZULFIDINE) tablet 1,000 mg: 1000 mg | ORAL | @ 11:00:00

## 2020-04-06 MED ADMIN — sulfaSALAzine (AZULFIDINE) tablet 1,000 mg: 1000 mg | ORAL | @ 22:00:00

## 2020-04-06 MED ADMIN — acetaminophen (TYLENOL) tablet 650 mg: 650 mg | ORAL | @ 03:00:00

## 2020-04-06 MED ADMIN — pantoprazole (PROTONIX) EC tablet 20 mg: 20 mg | ORAL | @ 16:00:00

## 2020-04-06 MED ADMIN — pregabalin (LYRICA) capsule 225 mg: 225 mg | ORAL | @ 16:00:00

## 2020-04-06 MED ADMIN — infliximab-axxq (AVSOLA) 5 mg/kg = 400 mg in sodium chloride (NS) 250 mL IVPB: 5 mg/kg | INTRAVENOUS | @ 16:00:00 | Stop: 2020-04-06

## 2020-04-06 NOTE — Unmapped (Signed)
All scheduled meds given. Tylenol given for a headache and Melatonin given to help pt sleep. Pt has been asleep for long interval. No reports of any fall/injury, will continue to monitor.   Problem: Adult Inpatient Plan of Care  Goal: Plan of Care Review  Outcome: Progressing     Problem: Infection  Goal: Absence of Infection Signs and Symptoms  Outcome: Progressing

## 2020-04-06 NOTE — Unmapped (Signed)
Hospital Medicine Daily Progress Note    Assessment/Plan:    Principal Problem:    Exacerbation of ulcerative colitis without complication (CMS-HCC)  Resolved Problems:    * No resolved hospital problems. *                 Kevin Tran is a 60 y.o. male that presented to [facility] with Exacerbation of ulcerative colitis without complication (CMS-HCC).    Recurrent exacerbation of Ulcerative colitis:  Recent admission for same, dc'd 2/16. Treated with 20mg  Q8H solumedrol during admission. Had symptomatic improvement with this regimen but has had recurrence since transition to oral prednisone 40mg  daily. Received Stelara last week. GI with plans to initiate Infliximab this admission if c diff negative.  - pain control with PRN acetaminophen, oxycodone 10mg , morphine 6mg  per pain scale  - GI consult appreciated. Infliximab given this AM. Continue solumedrol until dc on 2/26 (pending clinical improvement  - IV solumedrol 20 mg TID   -C diff negative  - regular diet  ??  Rheumatoid Arthritis:??  Not taking??methotrexate??due to concerns for side effects. His joint pain has been under control, likely due to recent steroid taper.   - CTM  ??  Esophageal Dysphagia:??Protonix as above  ??  Back Pain: Lyrica daily and gabapentin nightly??  ??  Code Status:  Full code, wife Ricardo Schubach would be his HDCM??[phone].??  ??  DVT prophylaxis:??Enoxaparin given increased risk of VTE in IBD patients    Dispo:  Assuming improvement with medical management, dc home 2/26 with PO prednisone     Floor time 35 minutes minutes, > 50% spent in counseling and coordination of care about the following issues:  meds, dispo planning, discussions with Gastroenterology.      ___________________________________________________________________    Subjective:  No acute events overnight. More diarrhea with dinner last night. Still hopes to go home immediately. Explained that were we to dc without improvement in sx, pt would need to return. We like to prevent that, so would want to wait to dc until sx improved or GI develops alternative plan.     General ROS: negative    Labs/Studies:  Labs and Studies from the last 24hrs per EMR and Reviewed    Objective:  Temp:  [36 ??C (96.8 ??F)-36.8 ??C (98.3 ??F)] 36.8 ??C (98.3 ??F)  Heart Rate:  [66-82] 82  Resp:  [16-18] 16  BP: (102-126)/(59-76) 122/70  SpO2:  [93 %-95 %] 94 %    GEN: NAD, lying in bed  EYES: EOMI  ENT: MMM  CV: RRR  PULM: CTA B  ABD: soft, ND, mild TTP, hyperactive BS  EXT: No edema

## 2020-04-06 NOTE — Unmapped (Cosign Needed)
[facility] Gastroenterology Consult Service   Progress Note         Assessment & Plan:   Kevin Tran is a 60 y.o. male with a PMHx of left-sided ulcerative colitis currently on prednisone/ustekinumab, RA that presented to [facility] with recurrence of profuse watery diarrhea and abdominal pain. Pt was seen in consultation at the request of [name], PA (Med Undesignated (MDX)) for ulcerative colitis.    Principal Problem:    Exacerbation of ulcerative colitis without complication (CMS-HCC)  Resolved Problems:    * No resolved hospital problems. *    Acute Flare of Left-Sided Ulcerative Colitis: Kevin Tran feels about the same today. He is back on IV steroids and his infliximab is currently infusing. Continues with profuse, watery diarrhea. I discussed need with him to remain hospitalized through at least Satuday, he was not excited about this but is agreeable to remain.   -- Methylprednisolone 20mg  IV TID  -- Our plan will be to discharge him back on prednisone 40mg  PO daily  -- Infliximab 5mg /kg infusing now, Sherrilyn Rist from our clinic is working on outpatient authorization  -- Trend at least CBC, BMP, albumin, and CRP daily  -- HLA-[id number] pending  -- Agree with ordered pharmacologic DVT prophylaxis  -- Consider ordering him nutritional supplements (Ensure/Boost) to help with PO intake    Thank you for involving [name] in the care of your patient. We will continue to follow along with you. This patient was seen and examined with Dr. Carmon Sails.     For questions, please contact the on-call fellow for the [facility] Gastroenterology Consult Service at [phone] (available 8AM-5PM only, excluding weekends and holidays).     Interval History:   No acute events overnight. , Patient reports ongoing loose bowel movements since yesterday. , Denies blood in the stools.  and There is ongoing left-sided abdominal pain.     ROS: Denies headache, chest pain, shortness of breath, nausea, vomiting.    Objective:   Temp:  [36 ??C-36.8 ??C] 36.8 ??C  Heart Rate:  [66-82] 76  Resp:  [16-18] 16  BP: (102-126)/(59-76) 118/68  SpO2:  [93 %-95 %] 94 %    Gen: WDWN male in NAD, answers questions appropriately  Eyes: sclera anicteric, EOMI  Abdomen: Soft, NTND, no rebound/guarding  Psych: Alert, appropriate mood and affect    Labs/Studies: Labs and Studies from the last 24hrs per EMR and Reviewed

## 2020-04-07 LAB — BASIC METABOLIC PANEL
ANION GAP: 6 mmol/L (ref 5–14)
BLOOD UREA NITROGEN: 14 mg/dL (ref 9–23)
BUN / CREAT RATIO: 15
CALCIUM: 9.7 mg/dL (ref 8.7–10.4)
CHLORIDE: 105 mmol/L (ref 98–107)
CO2: 28.7 mmol/L (ref 20.0–31.0)
CREATININE: 0.96 mg/dL
EGFR CKD-EPI AA MALE: >90 mL/min/{1.73_m2} (ref >=60)
EGFR CKD-EPI NON-AA MALE: 86 mL/min/{1.73_m2} (ref >=60)
GLUCOSE RANDOM: 107 mg/dL (ref 70–179)
POTASSIUM: 4.1 mmol/L (ref 3.4–4.5)
SODIUM: 140 mmol/L (ref 135–145)

## 2020-04-07 LAB — CBC
HEMATOCRIT: 39.9 % (ref 38.0–50.0)
HEMOGLOBIN: 12.8 g/dL — ABNORMAL LOW (ref 13.5–17.5)
MEAN CORPUSCULAR HEMOGLOBIN CONC: 32.1 g/dL (ref 30.0–36.0)
MEAN CORPUSCULAR HEMOGLOBIN: 26 pg (ref 26.0–34.0)
MEAN CORPUSCULAR VOLUME: 81.1 fL (ref 81.0–95.0)
MEAN PLATELET VOLUME: 8.2 fL (ref 7.0–10.0)
PLATELET COUNT: 265 10*9/L (ref 150–450)
RED BLOOD CELL COUNT: 4.92 10*12/L (ref 4.32–5.72)
RED CELL DISTRIBUTION WIDTH: 15.6 % — ABNORMAL HIGH (ref 12.0–15.0)
WBC ADJUSTED: 13.2 10*9/L — ABNORMAL HIGH (ref 3.5–10.5)

## 2020-04-07 LAB — C-REACTIVE PROTEIN: C-REACTIVE PROTEIN: <4 mg/L (ref <=10.0)

## 2020-04-07 MED ADMIN — acetaminophen (TYLENOL) tablet 650 mg: 650 mg | ORAL | @ 17:00:00

## 2020-04-07 MED ADMIN — pregabalin (LYRICA) capsule 225 mg: 225 mg | ORAL | @ 01:00:00

## 2020-04-07 MED ADMIN — sulfaSALAzine (AZULFIDINE) tablet 1,000 mg: 1000 mg | ORAL | @ 21:00:00

## 2020-04-07 MED ADMIN — methylPREDNISolone sodium succinate (PF) (Solu-MEDROL) injection 20 mg: 20 mg | INTRAVENOUS | @ 01:00:00

## 2020-04-07 MED ADMIN — melatonin tablet 3 mg: 3 mg | ORAL | @ 04:00:00

## 2020-04-07 MED ADMIN — MORPhine 4 mg/mL injection 6 mg: 6 mg | INTRAVENOUS | @ 01:00:00 | Stop: 2020-04-18

## 2020-04-07 MED ADMIN — enoxaparin (LOVENOX) syringe 40 mg: 40 mg | SUBCUTANEOUS | @ 14:00:00

## 2020-04-07 MED ADMIN — methylPREDNISolone sodium succinate (PF) (Solu-MEDROL) injection 20 mg: 20 mg | INTRAVENOUS | @ 14:00:00

## 2020-04-07 MED ADMIN — sulfaSALAzine (AZULFIDINE) tablet 1,000 mg: 1000 mg | ORAL | @ 12:00:00

## 2020-04-07 MED ADMIN — sulfaSALAzine (AZULFIDINE) tablet 1,000 mg: 1000 mg | ORAL | @ 01:00:00

## 2020-04-07 MED ADMIN — carboxymethylcellulose sodium (THERATEARS) 0.25 % ophthalmic solution 1 drop: 1 [drp] | OPHTHALMIC | @ 17:00:00

## 2020-04-07 MED ADMIN — folic acid (FOLVITE) tablet 1 mg: 1 mg | ORAL | @ 14:00:00

## 2020-04-07 MED ADMIN — methylPREDNISolone sodium succinate (PF) (Solu-MEDROL) injection 20 mg: 20 mg | INTRAVENOUS | @ 19:00:00

## 2020-04-07 MED ADMIN — multivitamins, therapeutic with minerals tablet 1 tablet: 1 | ORAL | @ 14:00:00

## 2020-04-07 MED ADMIN — sulfaSALAzine (AZULFIDINE) tablet 1,000 mg: 1000 mg | ORAL | @ 17:00:00

## 2020-04-07 MED ADMIN — pantoprazole (PROTONIX) EC tablet 20 mg: 20 mg | ORAL | @ 14:00:00

## 2020-04-07 MED ADMIN — acetaminophen (TYLENOL) tablet 650 mg: 650 mg | ORAL | @ 12:00:00

## 2020-04-07 MED ADMIN — carboxymethylcellulose sodium (THERATEARS) 0.25 % ophthalmic solution 1 drop: 1 [drp] | OPHTHALMIC | @ 21:00:00

## 2020-04-07 MED ADMIN — hydrocortisone 1 % cream: TOPICAL | @ 14:00:00

## 2020-04-07 MED ADMIN — pregabalin (LYRICA) capsule 225 mg: 225 mg | ORAL | @ 14:00:00

## 2020-04-07 NOTE — Unmapped (Signed)
AAOx4. Room air, no shortness of breath. Multiple stools during shift. No c/o pain or discomfort. Bed low, call light in reach.       Problem: Adult Inpatient Plan of Care  Goal: Plan of Care Review  Outcome: Progressing  Goal: Patient-Specific Goal (Individualized)  Outcome: Progressing  Goal: Absence of Hospital-Acquired Illness or Injury  Outcome: Progressing  Intervention: Identify and Manage Fall Risk  Recent Flowsheet Documentation  Taken 04/07/2020 1400 by Sammuel Hines, RN  Safety Interventions: fall reduction program maintained  Taken 04/07/2020 1200 by Sammuel Hines, RN  Safety Interventions: fall reduction program maintained  Taken 04/07/2020 1000 by Sammuel Hines, RN  Safety Interventions: fall reduction program maintained  Taken 04/07/2020 0800 by Sammuel Hines, RN  Safety Interventions: fall reduction program maintained  Intervention: Prevent and Manage VTE (Venous Thromboembolism) Risk  Recent Flowsheet Documentation  Taken 04/07/2020 0800 by Sammuel Hines, RN  Activity Management: activity encouraged  Goal: Optimal Comfort and Wellbeing  Outcome: Progressing  Goal: Readiness for Transition of Care  Outcome: Progressing  Goal: Rounds/Family Conference  Outcome: Progressing     Problem: Infection  Goal: Absence of Infection Signs and Symptoms  Outcome: Progressing

## 2020-04-07 NOTE — Unmapped (Addendum)
Recurrent exacerbation of Ulcerative colitis:  Recent admission for same, dc'd 2/16. Treated with 20mg  Q8H solumedrol during admission. Had symptomatic improvement with this regimen but has had recurrence since transition to oral prednisone 40mg  daily. Received Stelara last week. GI with plans to initiate   - Infliximab given 04/06/20  - pain control with PRN acetaminophen, oxycodone 10mg , morphine 6mg  per pain scale  - IV solumedrol 20 mg TID   -C diff negative  - Dc home with PO 40 mg prednisone daily. May want to consider PJP prophylaxis in the future given ongoing immunosuppressants. Will defer to outpt provider.      Rheumatoid Arthritis:   Not taking methotrexate due to concerns for side effects. His joint pain has been under control, likely due to recent steroid taper.        Esophageal Dysphagia: Protonix as above     Back Pain: Lyrica daily and gabapentin nightly

## 2020-04-07 NOTE — Unmapped (Signed)
Pt. Complaining of abdominal pain every time he eats. IV morphine administered as ordered and was effective. Melatonin requested for sleep and was effective.  Alert and oriented x 4. Pt. Still having diarrhea and abdominal cramping. IV steroids administered as ordered. Pt. Frustrated that his symptoms are not improving after the Remicaid infusion today.  PIV flushed and maintained. All safety measures maintained. Will continue to monitor. Possible discharge 04/08/20.  Problem: Adult Inpatient Plan of Care  Goal: Plan of Care Review  Outcome: Progressing  Goal: Patient-Specific Goal (Individualized)  Outcome: Progressing  Goal: Absence of Hospital-Acquired Illness or Injury  Outcome: Progressing  Intervention: Identify and Manage Fall Risk  Recent Flowsheet Documentation  Taken 04/07/2020 0000 by Rhina Brackett, RN  Safety Interventions:   fall reduction program maintained   low bed  Taken 04/06/2020 2200 by Rhina Brackett, RN  Safety Interventions:   fall reduction program maintained   low bed  Taken 04/06/2020 2000 by Rhina Brackett, RN  Safety Interventions:   fall reduction program maintained   low bed  Intervention: Prevent Infection  Recent Flowsheet Documentation  Taken 04/07/2020 0000 by Rhina Brackett, RN  Infection Prevention:   hand hygiene promoted   rest/sleep promoted   single patient room provided   visitors restricted/screened  Taken 04/06/2020 2000 by Rhina Brackett, RN  Infection Prevention: hand hygiene promoted  Goal: Optimal Comfort and Wellbeing  Outcome: Progressing  Goal: Readiness for Transition of Care  Outcome: Progressing  Goal: Rounds/Family Conference  Outcome: Progressing     Problem: Infection  Goal: Absence of Infection Signs and Symptoms  Outcome: Progressing  Intervention: Prevent or Manage Infection  Recent Flowsheet Documentation  Taken 04/07/2020 0000 by Rhina Brackett, RN  Infection Management: aseptic technique maintained  Taken 04/06/2020 2000 by Rhina Brackett, RN  Infection Management: aseptic technique maintained

## 2020-04-07 NOTE — Unmapped (Signed)
Hospital Medicine Daily Progress Note    Assessment/Plan:    Principal Problem:    Exacerbation of ulcerative colitis without complication (CMS-HCC)  Resolved Problems:    * No resolved hospital problems. *                 Kevin Tran is a 60 y.o. male that presented to [facility] with Exacerbation of ulcerative colitis without complication (CMS-HCC).    Recurrent exacerbation of Ulcerative colitis:  Recent admission for same, dc'd 2/16.??Treated with 20mg  Q8H solumedrol during admission. Had symptomatic improvement with this regimen but has had recurrence since transition to oral prednisone 40mg  daily. Received Stelara last week. GI with plans to initiate Infliximab this admission if c diff negative.  - pain control with PRN acetaminophen, oxycodone 10mg , morphine 6mg  per pain scale  - GI??consult appreciated. Infliximab given 2/24. Continue solumedrol until dc   - 40 mg PO prednisone daily on dc  - IV solumedrol 20 mg TID  ??-C diff negative  - regular diet  ??  Rheumatoid Arthritis:??  Not taking??methotrexate??due to concerns for side effects. His joint pain has been under control, likely due to recent steroid taper.   - CTM  ??  Eye discomfort:   Left eye discomfort, itching and burning, overnight. No change in vision. No dc. Pt reported mild puffiness overnight, but says it is gone now. Right eye feels fine. This is not a listed s/e of remicade. No FB on exam. Minimal erythema of lower conjunctiva.   -Start theaters drops QID  -Monitor for s/sx of infection     Esophageal Dysphagia:??Protonix as above  ??  Back Pain: Lyrica daily and gabapentin nightly??  ??  Code Status:  Full code, wife Elvert Cumpton would be his HDCM??[phone].??  ??  DVT prophylaxis:??Enoxaparin given increased risk of VTE in IBD patients  ??  Dispo:  Assuming improvement with medical management, possible dc home 2/26 with PO prednisone vs second remicade infusion and delayed dc  ??    Floor time 35 minutes minutes, > 50% spent in counseling and coordination of care about the following issues:  ongoing sx, plan, discussions with Gastroenterology.      ___________________________________________________________________    Subjective:  No acute events overnight. No change in sx since admit. Pt states GI said they may do second remicade infusion 2/26 if still no improvement in sx.   L eye burning and itching overnight. Does not feel like something is in eye. Has never had this happen before. NO vision changes, blurriness, floaters, etc.     General ROS: negative    Labs/Studies:  Labs and Studies from the last 24hrs per EMR and Reviewed    Objective:  Temp:  [36.6 ??C (97.8 ??F)-36.9 ??C (98.5 ??F)] 36.8 ??C (98.3 ??F)  Heart Rate:  [70-85] 84  Resp:  [16] 16  BP: (102-130)/(58-76) 129/68  SpO2:  [93 %-96 %] 93 %    GEN: NAD, lying in bed  EYES: EOMI, sclera clear bilaterally, Left with minimal erythema of lower conjunctiva.   ENT: MMM  CV: RRR  PULM: CTA B  ABD: soft, mild TTP, ND,  Hyperactive BS  EXT: No edema

## 2020-04-07 NOTE — Unmapped (Signed)
[facility] Gastroenterology Consult Service   Progress Note         Assessment & Plan:   Kevin Tran is a 60 y.o. male with a PMHx of left-sided ulcerative colitis currently on prednisone/ustekinumab, RA that presented to [facility] with recurrence of profuse watery diarrhea and abdominal pain. Pt was seen in consultation at the request of [name], PA (Med Undesignated (MDX)) for ulcerative colitis.    Principal Problem:    Exacerbation of ulcerative colitis without complication (CMS-HCC)  Resolved Problems:    * No resolved hospital problems. *    Acute Flare of Left-Sided Ulcerative Colitis: Kevin Tran feels about the same today. He remains on IV steroids and is s/p infliximab 2/24. Continues with profuse, watery diarrhea. I discussed need with him to remain hospitalized until improvement in his symptoms. If he continues to have ongoing diffuse, watery diarrhea -- he should not be discharged over the weekend. We are not available over the weekend at [facility], but will reassess him on Monday if he remains hospitalized. He is currently not ready for discharge from our perspective. He will be ready for discharge when his stools firm up, # of stools per day decreases, pain controlled, and he is tolerating PO intake without issue.   -- Methylprednisolone 20mg  IV TID  -- Our plan will be to discharge him back on prednisone 40mg  PO daily  -- Trend at least CBC, BMP, albumin, and CRP daily  -- HLA-DQ105 pending  -- Agree with ordered pharmacologic DVT prophylaxis  -- Consider ordering him nutritional supplements (Ensure/Boost) to help with PO intake    Thank you for involving [name] in the care of your patient. We will continue to follow along with you. This [place].     For questions, please contact the on-call fellow for the [facility] Gastroenterology Consult Service at [phone] (available 8AM-5PM only, excluding weekends and holidays).     Interval History:   No acute events overnight. Patient reports ongoing loose bowel movements since yesterday. Denies blood in the stools. Continues with LLQ abdominal pain, not sharp just crampy in nature.     ROS: Denies headache, chest pain, shortness of breath, nausea, vomiting.    Objective:   Temp:  [36.6 ??C-36.9 ??C] 36.8 ??C  Heart Rate:  [70-85] 84  Resp:  [16] 16  BP: (115-130)/(58-76) 129/68  SpO2:  [93 %-96 %] 93 %    Gen: WDWN male in NAD, answers questions appropriately  Eyes: sclera anicteric, EOMI  Abdomen: Soft, LLQ tenderness, ND, no rebound/guarding  Psych: Alert, appropriate mood and affect    Labs/Studies: Labs and Studies from the last 24hrs per EMR and Reviewed

## 2020-04-07 NOTE — Unmapped (Signed)
Patient VSS, afebrile, and minimal c/o pain  at this time.  Patient continuing to have watery BMS today after eating breakfast.  Patient received Avsola today as ordered, no adverse reactions during or after infusion.  Patient tolerating PO intake and voiding adequately.  Problem: Adult Inpatient Plan of Care  Goal: Plan of Care Review  Outcome: Ongoing - Unchanged  Goal: Patient-Specific Goal (Individualized)  Outcome: Ongoing - Unchanged  Goal: Absence of Hospital-Acquired Illness or Injury  Outcome: Ongoing - Unchanged  Intervention: Identify and Manage Fall Risk  Recent Flowsheet Documentation  Taken 04/06/2020 0800 by Franchot Erichsen, RN  Safety Interventions:   fall reduction program maintained   low bed   nonskid shoes/slippers when out of bed  Intervention: Prevent and Manage VTE (Venous Thromboembolism) Risk  Recent Flowsheet Documentation  Taken 04/06/2020 0800 by Franchot Erichsen, RN  Activity Management: up in chair  Goal: Optimal Comfort and Wellbeing  Outcome: Ongoing - Unchanged  Goal: Readiness for Transition of Care  Outcome: Ongoing - Unchanged  Goal: Rounds/Family Conference  Outcome: Ongoing - Unchanged     Problem: Infection  Goal: Absence of Infection Signs and Symptoms  Outcome: Ongoing - Unchanged

## 2020-04-08 LAB — BASIC METABOLIC PANEL
ANION GAP: 6 mmol/L (ref 5–14)
BLOOD UREA NITROGEN: 16 mg/dL (ref 9–23)
BUN / CREAT RATIO: 20
CALCIUM: 10.2 mg/dL (ref 8.7–10.4)
CHLORIDE: 104 mmol/L (ref 98–107)
CO2: 29.4 mmol/L (ref 20.0–31.0)
CREATININE: 0.8 mg/dL
EGFR CKD-EPI AA MALE: >90 mL/min/{1.73_m2} (ref >=60)
EGFR CKD-EPI NON-AA MALE: >90 mL/min/{1.73_m2} (ref >=60)
GLUCOSE RANDOM: 120 mg/dL (ref 70–179)
POTASSIUM: 4.4 mmol/L (ref 3.4–4.5)
SODIUM: 139 mmol/L (ref 135–145)

## 2020-04-08 LAB — CBC
HEMATOCRIT: 42.5 % (ref 38.0–50.0)
HEMOGLOBIN: 13.7 g/dL (ref 13.5–17.5)
MEAN CORPUSCULAR HEMOGLOBIN CONC: 32.2 g/dL (ref 30.0–36.0)
MEAN CORPUSCULAR HEMOGLOBIN: 25.8 pg — ABNORMAL LOW (ref 26.0–34.0)
MEAN CORPUSCULAR VOLUME: 80.2 fL — ABNORMAL LOW (ref 81.0–95.0)
MEAN PLATELET VOLUME: 8.4 fL (ref 7.0–10.0)
PLATELET COUNT: 299 10*9/L (ref 150–450)
RED BLOOD CELL COUNT: 5.3 10*12/L (ref 4.32–5.72)
RED CELL DISTRIBUTION WIDTH: 15.6 % — ABNORMAL HIGH (ref 12.0–15.0)
WBC ADJUSTED: 15.4 10*9/L — ABNORMAL HIGH (ref 3.5–10.5)

## 2020-04-08 LAB — C-REACTIVE PROTEIN: C-REACTIVE PROTEIN: <4 mg/L (ref <=10.0)

## 2020-04-08 MED ORDER — PREDNISONE 10 MG TABLET
ORAL_TABLET | ORAL | 0 refills | 28 days | Status: CP
Start: 2020-04-08 — End: 2020-05-06

## 2020-04-08 MED ADMIN — methylPREDNISolone sodium succinate (PF) (Solu-MEDROL) injection 20 mg: 20 mg | INTRAVENOUS | @ 14:00:00 | Stop: 2020-04-08

## 2020-04-08 MED ADMIN — pregabalin (LYRICA) capsule 225 mg: 225 mg | ORAL | @ 03:00:00

## 2020-04-08 MED ADMIN — acetaminophen (TYLENOL) tablet 650 mg: 650 mg | ORAL | @ 10:00:00 | Stop: 2020-04-08

## 2020-04-08 MED ADMIN — pantoprazole (PROTONIX) EC tablet 20 mg: 20 mg | ORAL | @ 14:00:00 | Stop: 2020-04-08

## 2020-04-08 MED ADMIN — folic acid (FOLVITE) tablet 1 mg: 1 mg | ORAL | @ 14:00:00 | Stop: 2020-04-08

## 2020-04-08 MED ADMIN — oxyCODONE (ROXICODONE) immediate release tablet 10 mg: 10 mg | ORAL | @ 03:00:00 | Stop: 2020-04-18

## 2020-04-08 MED ADMIN — sulfaSALAzine (AZULFIDINE) tablet 1,000 mg: 1000 mg | ORAL | @ 10:00:00 | Stop: 2020-04-08

## 2020-04-08 MED ADMIN — diphenhydrAMINE (BENADRYL) capsule/tablet 25 mg: 25 mg | ORAL | @ 03:00:00

## 2020-04-08 MED ADMIN — melatonin tablet 3 mg: 3 mg | ORAL | @ 03:00:00

## 2020-04-08 MED ADMIN — pregabalin (LYRICA) capsule 225 mg: 225 mg | ORAL | @ 14:00:00 | Stop: 2020-04-08

## 2020-04-08 MED ADMIN — carboxymethylcellulose sodium (THERATEARS) 0.25 % ophthalmic solution 1 drop: 1 [drp] | OPHTHALMIC | @ 10:00:00 | Stop: 2020-04-08

## 2020-04-08 MED ADMIN — sulfaSALAzine (AZULFIDINE) tablet 1,000 mg: 1000 mg | ORAL | @ 03:00:00

## 2020-04-08 MED ADMIN — carboxymethylcellulose sodium (THERATEARS) 0.25 % ophthalmic solution 1 drop: 1 [drp] | OPHTHALMIC | @ 03:00:00

## 2020-04-08 MED ADMIN — multivitamins, therapeutic with minerals tablet 1 tablet: 1 | ORAL | @ 14:00:00 | Stop: 2020-04-08

## 2020-04-08 MED ADMIN — methylPREDNISolone sodium succinate (PF) (Solu-MEDROL) injection 20 mg: 20 mg | INTRAVENOUS | @ 03:00:00

## 2020-04-08 NOTE — Unmapped (Addendum)
Pt. Complaining of abdominal pain. Oxycodone administered and was effective. Alert and oriented x 4.  No acute change occurred during shift. Wife at bedside at the start of the shift. PIV flushed and maintained. IV steroid administered as ordered. Eye drops administered as ordered. Pt. Requested melatonin and benedryl for sleep.  Both were effective. All safety measures maintained. Will continue to monitor.  Possible discharge today.  Problem: Adult Inpatient Plan of Care  Goal: Plan of Care Review  Outcome: Progressing  Goal: Patient-Specific Goal (Individualized)  Outcome: Progressing  Goal: Absence of Hospital-Acquired Illness or Injury  Outcome: Progressing  Intervention: Identify and Manage Fall Risk  Recent Flowsheet Documentation  Taken 04/08/2020 0200 by Rhina Brackett, RN  Safety Interventions:  ??? fall reduction program maintained  ??? low bed  Taken 04/08/2020 0000 by Rhina Brackett, RN  Safety Interventions:  ??? fall reduction program maintained  ??? low bed  Taken 04/07/2020 2200 by Rhina Brackett, RN  Safety Interventions:  ??? fall reduction program maintained  ??? family at bedside  Taken 04/07/2020 2000 by Rhina Brackett, RN  Safety Interventions:  ??? low bed  ??? family at bedside  ??? fall reduction program maintained  Intervention: Prevent Infection  Recent Flowsheet Documentation  Taken 04/08/2020 0200 by Rhina Brackett, RN  Infection Prevention:  ??? hand hygiene promoted  ??? rest/sleep promoted  ??? single patient room provided  ??? visitors restricted/screened  Taken 04/08/2020 0000 by Rhina Brackett, RN  Infection Prevention:  ??? hand hygiene promoted  ??? rest/sleep promoted  ??? single patient room provided  ??? visitors restricted/screened  Taken 04/07/2020 2200 by Rhina Brackett, RN  Infection Prevention: hand hygiene promoted  Taken 04/07/2020 2000 by Rhina Brackett, RN  Infection Prevention: hand hygiene promoted  Goal: Optimal Comfort and Wellbeing  Outcome: Progressing  Goal: Readiness for Transition of Care  Outcome: Progressing  Goal: Rounds/Family Conference  Outcome: Progressing     Problem: Infection  Goal: Absence of Infection Signs and Symptoms  Outcome: Progressing  Intervention: Prevent or Manage Infection  Recent Flowsheet Documentation  Taken 04/08/2020 0200 by Rhina Brackett, RN  Infection Management: aseptic technique maintained  Taken 04/08/2020 0000 by Rhina Brackett, RN  Infection Management: aseptic technique maintained  Taken 04/07/2020 2200 by Rhina Brackett, RN  Infection Management: aseptic technique maintained  Taken 04/07/2020 2000 by Rhina Brackett, RN  Infection Management: aseptic technique maintained

## 2020-04-10 NOTE — Unmapped (Signed)
Spoke to pt today  He states he left the ED on Saturday 04/08/20, he continues to have urgency,  10-15 BM a day. He had 1 dose of inflixiamb on 04/06/20. He continues prednisone 40mg  daily. Discussed in person visit with Dr Raphael Gibney on 04/11/20 however he doesn't feel he can make it to an in person visit due to his diarrhea. Scheduled now with Dr Raphael Gibney for 04/11/20.    msg sent to Georg Ruddle for assistance in scheduling outpatient loading doses

## 2020-04-10 NOTE — Unmapped (Signed)
Physician Discharge Summary    Admit date: 04/04/2020    Discharge date and time: 04/03/2019    Discharge to: Home    Discharge Service: Med Undesignated (MDX)    Discharge Attending Physician: No att. providers found    Discharge Diagnoses: Exacerbation of ulcerative colitis.        Hospital Course:  Recurrent exacerbation of Ulcerative colitis:    The patient is a 60 year old male with a well-established diagnosis of ulcerative colitis who was admitted to the hospital for exacerbation of his inflammatory bowel disease with loose stools and diarrhea.  He was seen in Gastroenterology consultation and treated with intravenous Solu-Medrol 20 mg intravenously 3 times a day.  C. difficile testing was negative.  Once his symptoms improved he began to have formed stools, he very much wanted to be discharged and was discharged home on oral prednisone as noted below..  His abdominal pain was treated with Tylenol, oxycodone and morphine.  He is not on opiates chronically but is on Lyrica and gabapentin.    The patient had a recent admission for the same symptoms, and was discharged from the hospital on 03/29/2020.   At that time he was also treated with 20mg  Q8H solumedrol.  He experienced symptomatic improvement with this regimen but has had recurrence since transition to oral prednisone 40mg  daily and subsequently required readmission to the hospital.  He received Stelara last week.  He will be seen in close GI follow-up and has an appointment on May 3 but may require follow-up prior to that.  He knows to contact GI clinic for any worsening of his symptoms.    - Infliximab given 04/06/20    -He is now discharged  home with the prednisone taper as shown below.  He may require PJP prophylaxis in the future given ongoing immunosuppressants, but this decision may be considered in outpatient follow-up.      Rheumatoid Arthritis:   He is not taking methotrexate due to concerns for side effects. His joint pain has been under control, likely due to recent steroid taper.     Esophageal Dysphagia:  Continue Protonix.     Back Pain: Lyrica daily and gabapentin nightly        Condition at Discharge: stable  Discharge Medications:      Your Medication List      CHANGE how you take these medications    predniSONE 10 MG tablet  Commonly known as: DELTASONE  Take 4 tablets (40 mg total) by mouth daily for 7 days, THEN 3 tablets (30 mg total) daily for 7 days, THEN 2 tablets (20 mg total) daily for 7 days, THEN 1 tablet (10 mg total) daily for 7 days.  Start taking on: April 08, 2020  What changed: See the new instructions.        CONTINUE taking these medications    acetaminophen 500 MG tablet  Commonly known as: TYLENOL  Take 500 mg by mouth every six (6) hours as needed.     fluticasone propionate 50 mcg/actuation nasal spray  Commonly known as: FLONASE  2 sprays into each nostril daily as needed for allergies.     folic acid 1 MG tablet  Commonly known as: FOLVITE  Take 1 tablet (1 mg total) by mouth daily.     melatonin 3 mg Tab  Take 1 tablet (3 mg total) by mouth nightly as needed.     multivitamin capsule  Take 1 capsule by mouth daily.     omeprazole  20 MG capsule  Commonly known as: PriLOSEC  Take 20 mg by mouth daily.     oxyCODONE 10 mg immediate release tablet  Commonly known as: ROXICODONE  Take 1 tablet (10 mg total) by mouth every four (4) hours as needed for up to 5 doses.     pregabalin 225 MG capsule  Commonly known as: LYRICA  Take 225 mg by mouth Two (2) times a day.     STELARA 90 mg/mL Syrg syringe  Generic drug: ustekinumab  Inject 1 mL (90 mg total) under the skin every twenty-eight (28) days.     sulfaSALAzine 500 mg tablet  Commonly known as: AZULFIDINE  Take 2 tablets (1,000 mg total) by mouth four (4) times a day.            Pending Test Results:     Pending Labs     Order Current Status    [id number] In process          Discharge Instructions:   Activity Instructions     Activity as tolerated          Diet Instructions Discharge diet (specify)      Discharge Nutrition Therapy: Regular    Resume previous diet             Other Instructions     Call MD for:      Blood in stool, return of diarrhea.    Call MD for:  persistent nausea or vomiting      Call MD for:  severe uncontrolled pain      Call MD for: Temperature > 38.5 Celsius ( > 101.3 Fahrenheit)      Discharge instructions to patient: Call your primary care doctor and make an appointment to see them:      Within 2 weeks from the time you are discharged from the hospital      Follow Up instructions and Outpatient Referrals     Call MD for:      Call MD for:  persistent nausea or vomiting      Call MD for:  severe uncontrolled pain      Call MD for: Temperature > 38.5 Celsius ( > 101.3 Fahrenheit)        Appointments which have been scheduled for you    Jun 13, 2020  1:00 PM  (Arrive by 12:45 PM)  RETURN IBD with [name], MD  [facility]IC[facility] [place] [facility]) [address]  [name] [address]  [phone]              I spent less than 30 minutes in the discharge of this patient.    Tawni Levy MD

## 2020-04-11 ENCOUNTER — Encounter: Admit: 2020-04-11 | Discharge: 2020-04-12 | Payer: PRIVATE HEALTH INSURANCE

## 2020-04-11 MED ORDER — BUDESONIDE 2 MG/ACTUATION RECTAL FOAM
Freq: Two times a day (BID) | RECTAL | 1 refills | 0 days | Status: CP
Start: 2020-04-11 — End: ?

## 2020-04-11 NOTE — Unmapped (Signed)
Kevin Tran it was a pleasure seeing you today.  Here is a summary/wrap up from today's visit:     1.  Continue remicade infusions as planned  2.  Start Uceris Foam twice daily x  Weeks, then 1 once daily x 4 weeks  3.  I will refer you to colorectal surgery to meet the surgeons in case you need surgery in the future  4.  Please keep [name] updated on your progress.  We could try prednisone 60mg  temporarily if needed.    5.  If you feel worse despite UCeris foam, please come to [facility].    [name], MD  Assistant Professor of Medicine  Division of Gastroenterology & Hepatology  [place] [facility]            EXPECTATIONS FOR PATIENT FOLLOW UP AND COMMUNICATION:  -- Follow up Appointments:  Crohn's disease and Ulcerative colitis are serious chronic inflammatory diseases which require close monitoring.  I typically expect to see patients for follow up visits at least every 6 months (or more often if you are having a flare, starting new therapy, etc).  In select cases, patients who are only on aminosalicylates / mesalamine, may be seen once per year for follow ups.      -- Communication:  if you have any questions or concerns, you can communicate with [name] via phone (contact information below) or via myChart.  Note that phone messages are given higher priority and triaged first.  We typically respond to myChart messages within 3 business days (occasionally longer during holidays or vacation times).  For urgent issues, please contact us via phone.      IBD NURSE COORDINATOR CONTACT ??? Neta Mends, RN BSN     Phone: [phone] (direct line)      Fax: [phone]  * For urgent medical concerns after hours or on weekends and holidays, call [phone] and ask to speak to the GI Fellow on call.    * If you have a GI medical question or GI symptoms and would like to speak to your provider's healthcare team, please contact Neta Mends, RN (contact information above) OR you can send the GI healthcare team a message through MyChart at [email or web address]    APPOINTMENT SCHEDULING FOR GI CLINIC AND GI PROCEDURES:  RADIOLOGY - to schedule imaging ordered, please call [phone] opt 1   GI MEDICINE CLINIC  [phone] option 1   GI PROCEDURES         [phone] option 2   *To schedule, reschedule, or cancel your GI appointment, please call [phone]. If you are unable to come to an appointment, please notify us as soon as possible, preferably 24 hours in advance. Doing so may allow other patients with urgent needs to be scheduled in a cancelled appointment slot.     TEST RESULTS   If you have a MyChart account, your new results and a provider message will be sent to you through your MyChart account at [email or web address]. For results that require follow-up, a member of your healthcare team will also contact you directly.    PRESCRIPTION REFILL REQUESTS  To request prescription refills, please contact your pharmacy or send your healthcare team a message through your MyChart account at [email or web address]  RECORD REQUESTS  For questions related to medical records, please call Medical Records Release of Information at [phone]  FINANCIAL COUNSELOR   For billing and other financial questions/needs ??? please contact Mee Hives at [phone]. If  you need to leave a message, please be sure to leave your full name, date of birth or MR#, best call back # and reason for call.    For educational material and resources:  [email or web address]  [place] COVID19 Vaccine Information: [email or web address]  ================================================================

## 2020-04-11 NOTE — Unmapped (Signed)
[facility]   FOLLOW UP NOTE - INFLAMMATORY BOWEL DISEASE  04/11/2020    Demographics:  Kevin Tran is a 60 y.o. year old male    Diagnosis:  Ulcerative Colitis  Disease onset (yr):  2018  Age at onset:  > 56yr old (A3)  Location:  Left-sided (E2)  Behavior:  Colitis  Current Tight Control Scenario:   Maintenance = Biologic/small molecule          HPI / NOTE :     VIDEO VISIT DUE TO COVID19    Interval Events:   1.  Last seen by me 03/07/2020.  Since then, he has been admitted to [facility] twice, I reviewed those records.  He had flex sig showing moderate (Mayo 2) colitis up to the extent of the exam. He improved with IV steroids and discharged on PO prednisone taper. Readmitted a week later with worsening symptoms despite prednisone.  He got IV Remicade in the hospital and was discharged over the weekend.   2.  Contacted Neta Mends with worsening symptoms despite prednisone.  Added on for urgent video visit today.     HPI:   Pain is improving, not as bad as before, still some cramping.  But still having a lot of diarrhea - 10-15x/day. Having some fecal incontinence episodes.   Appetite is ok.  Using some ensure.     Abdominal pain (0-10): mild - moderate  BM a day: 10-15x/day  Consistency: loose  % of stools have blood: 0%  Nocturnal BM: yes  Urgency: yes+, some incontinence  Weight change over last 6 mo: weight stable  Smoking: no  NSAIDS: avoids    Review of Systems:   Review of systems positive for: negative except as above.   Otherwise, the balance of 10 systems is negative.           IBD HISTORY:     Year of disease onset:  2018    Brief IBD Disease Course:  In late 2018 developed bloody diarrhea and incontinence. Colonoscopy in Feb 2019 showed edema and erythema in the rectum and sigmoid with decreased vascularity in the rest of the colon; the TI appeared normal. He was treated with courses of prednisone for much of 2019 and also put on mesalamine without improvement. In Fall 2019 he was started on Humira w/o improvement. In Dec 2019 he had moderate level antibodies to humira; was added; mesalamine was stopped; put on prednisone taper; rapidly improved  - Jan 2020 - stopped mesalamine, used weekly Humira + with initial improvement.   - April 2020 - notable worsening of diarrhea and abdominal pain  Colonoscopy with moderate inflammation mainly in rectum.  Changed to [name] 06/2018.  Had limited response initially, but improved with Uceris foam 09/2018.    - 09/2018 - taper to xeljanz 5mg  BID.  Nov 2020 - ongoing proctitis symptoms, restarted Uceris foam.   - 06/2019 - severe UC flare on [name], plan to restage disease and change therapy  - 10/2019 - moderate proctitis (up to ~20cm), started canasa + Stelara.  Good symptom response to Stelara initially, but had severe arthralgias.   - Feb 2022 - worsening UC flare despite Stelara.  Admitted to [facility], discharged on prednisone taper.  Recurrent symptoms, readmitted 1 week later, started on IV Remicade 5mg /kg in the hospital on 04/06/2020.     Endoscopy:      - Colonoscopy Feb 2019: edema + erythema in rectum and sigmoid; decreased vascularity  in rest of colon  - Dec 2019: moderate inflammation throughout the colon  - Colonoscopy 07/01/2018 - poor prep, moderate proctitis up to 15cm, mild patchy nodularity in remaining colon.  Normal terminal ileum.   - Colonoscopy 10/20/2019 - hemorrhoids.  Moderate (Mayo 2) colitis from anal verge up to 10cm.  Mild colitis from 10-20cm, then normal colon.  Normal terminal ileum.    PATH:  Moderately active chronic colitis  - Sigmoidoscopy 03/28/2020 - moderate (Mayo 2) colitis through the extent of the exam (up to the descending colon at least).    PATH:  Severe chronic active colitis with no CMV    Imaging:    - CT 01/28/18: left sided colitis; liver lesion (see below)  - MRI abdomen to evaluate liver lesion 02/09/18 - aforementioned liver lesion felt to be focal fatty infiltration;  6 month repeat recommended    Prior IBD medications (type, dose, duration, response):  [x]  5-ASAs - Mesalamine  - no improvement; possible paradoxical diarrhea  [x]  Oral corticosteroids - prednisone.  Good response with budesonide foam.   []  Intravenous corticosteroids  []  Antibiotics  [x]  Thiopurines: started in Dec 2019  []  Methotrexate  [x]  Anti-TNF therapies - Humira since Sept or Oct 2019. Low level and low titer Ab in Dec 2019. Increased to weekly dosing. Stopped spring 2020 due to non-response despite good drug levels.   []  Anti-Integrin therapies  []  Anti-Interleukin therapies  [x]  Anti-JAK therapies - Xeljanz 10mg  started 06/2018, tapered to 5mg  bid 09/2018  []  Cyclosporine  []  Clinical trial medication  []  Other (Please specify):    Extraintestinal manifestations:   -joint pains affecting: y, peripheral arthralgias  -eye: n  -skin: n  -oral ulcers :  n  -blood clots: n  -PSC: n  -other: n          Past Medical History:   Past medical history:   Past Medical History:   Diagnosis Date   ??? Arthritis    ??? Pneumothorax    ??? Ulcerative colitis (CMS-HCC)      Past surgical history:   Past Surgical History:   Procedure Laterality Date   ??? BACK SURGERY  1996   ??? COLONOSCOPY     ??? PR COLONOSCOPY W/BIOPSY SINGLE/MULTIPLE Left 07/01/2018    Procedure: COLONOSCOPY, FLEXIBLE, PROXIMAL TO SPLENIC FLEXURE; WITH BIOPSY, SINGLE OR MULTIPLE;  Surgeon: [name], MD;  [facility];  Service: Gastroenterology   ??? PR COLONOSCOPY W/BIOPSY SINGLE/MULTIPLE  10/20/2019    Procedure: COLONOSCOPY, FLEXIBLE, PROXIMAL TO SPLENIC FLEXURE; WITH BIOPSY, SINGLE OR MULTIPLE;  Surgeon: Luanne Bras, MD;  Location: [facility] PROCED[facility];  Service: Gastroenterology   ??? PR SIGMOIDOSCOPY,BIOPSY N/A 03/28/2020    Procedure: SIGMOIDOSCOPY, FLEXIBLE; WITH BIOPSY, SINGLE OR MULTIPLE;  Surgeon: Luanne Bras, MD;  [facility];  Service: Gastroenterology     Family history:   Family History   Problem Relation Age of Onset   ??? Cancer Mother      Social history:   Social History     Socioeconomic History   ??? Marital status: Married     Spouse name: Not on file   ??? Number of children: Not on file   ??? Years of education: Not on file   ??? Highest education level: Not on file   Occupational History   ??? Not on file   Tobacco Use   ??? Smoking status: Former Smoker     Types: Cigarettes   ???  Smokeless tobacco: Never Used   Vaping Use   ??? Vaping Use: Never used   Substance and Sexual Activity   ??? Alcohol use: Yes     Alcohol/week: 2.0 standard drinks     Types: 2 Cans of beer per week   ??? Drug use: No   ??? Sexual activity: Yes     Partners: Female   Other Topics Concern   ??? Not on file   Social History Narrative   ??? Not on file     Social Determinants of Health     Financial Resource Strain: Not on file   Food Insecurity: Not on file   Transportation Needs: Not on file   Physical Activity: Not on file   Stress: Not on file   Social Connections: Not on file             Allergies:   No Known Allergies          Medications:     Current Outpatient Medications   Medication Sig Dispense Refill   ??? acetaminophen (TYLENOL) 500 MG tablet Take 500 mg by mouth every six (6) hours as needed.      ??? fluticasone propionate (FLONASE) 50 mcg/actuation nasal spray 2 sprays into each nostril daily as needed for allergies.      ??? folic acid (FOLVITE) 1 MG tablet Take 1 tablet (1 mg total) by mouth daily. 30 tablet 11   ??? melatonin 3 mg Tab Take 1 tablet (3 mg total) by mouth nightly as needed.  0   ??? multivitamin capsule Take 1 capsule by mouth daily.      ??? omeprazole (PRILOSEC) 20 MG capsule Take 20 mg by mouth daily.      ??? oxyCODONE (ROXICODONE) 10 mg immediate release tablet Take 1 tablet (10 mg total) by mouth every four (4) hours as needed for up to 5 doses. 5 tablet 0   ??? predniSONE (DELTASONE) 10 MG tablet Take 4 tablets (40 mg total) by mouth daily for 7 days, THEN 3 tablets (30 mg total) daily for 7 days, THEN 2 tablets (20 mg total) daily for 7 days, THEN 1 tablet (10 mg total) daily for 7 days. 70 tablet 0   ??? pregabalin (LYRICA) 225 MG capsule Take 225 mg by mouth Two (2) times a day.     ??? sulfaSALAzine (AZULFIDINE) 500 mg tablet Take 2 tablets (1,000 mg total) by mouth four (4) times a day. 240 tablet 1   ??? ustekinumab (STELARA) 90 mg/mL Syrg syringe Inject 1 mL (90 mg total) under the skin every twenty-eight (28) days. 1 mL 5     No current facility-administered medications for this visit.             Physical Exam:   There were no vitals taken for this visit.    VIDEO VISIT - no vitals and limited physical exam    GEN: elderly male in no apparent distress, appears comfortable on exam  HEENT: eyes normal and symmetric  NEURO:  Normal speech, face symmetric  PULM:  Respirations comfortable  SKIN:  No visible rash on face/neck  Psych: affect appropriate, A&O x3          Labs, Data & Indices:     Lab Review:   Lab Results   Component Value Date    WBC 15.4 (H) 04/08/2020    WBC 4.9 08/04/2019    RBC 5.30 04/08/2020    RBC 5.34 08/04/2019    HGB  13.7 04/08/2020    HGB 14.7 08/04/2019     Lab Results   Component Value Date    AST 17 04/04/2020    AST 19 08/04/2019    ALT 20 04/04/2020    ALT 20 08/04/2019    BUN 16 04/08/2020    BUN 14 08/04/2019    Creatinine 0.80 04/08/2020    Creatinine 0.99 08/04/2019    CO2 29.4 04/08/2020    CO2 24 08/04/2019    Albumin 3.1 (L) 04/06/2020    Calcium 10.2 04/08/2020    Calcium 10.0 08/04/2019     No results found for: TSH   ...............................................................................................................................................Marland Kitchen  Modified Mayo Score for Ulcerative Colitis  Stool Frequency:   3 = > 5 more than normal  Rectal bleeding:   0 = None  Physicians Global Assessment:  3 = Severe colitis  Score: 6  Remission <2  ............................................................................................................................................  ORDERS THIS VISIT:       Diagnosis ICD-10-CM Associated Orders   1. 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 Arthralgia, unspecified joint  M25.50    3. High risk medication use  Z79.899            Assessment & Recommendations:   Disease state:    Kevin Tran is a 60 y.o. male with ulcerative pancolitis (mostly proctosigmoiditis) since 2018. He was started on Humira in 2019. By April 2020, he had a mechanistic non-response to Humira (good drug level but moderate to severe proctitis).  We changed to [name] in May 2020 with only partial response and then a fairly severe flare summer 2021. Colonoscopy 10/2019 showed moderate proctitis.  We started BID Canasa + Stelara in Sept 2021. He had partial improvement initially with Stelara but he had intense arthralgias and then a severe UC flare in Feb 2022 despite 3-4 months of Stelara therapy.  He was admitted to [facility] and started on Infliximab 04/06/2020.      He has had some improvement in abdominal pain since starting Infliximab but still having considerable diarrhea.  I think he needs additional time for Remicade to take effect and he may need 10mg /kg dosing.  I also discussed that remicade may not work in which case I would strongly advocate a colectomy.  We will refer him to see colorectal surgery now in case he has a non-response to remicade and needs surgery.  Last line medical therapy would be Cyclopsorine + Vedolizumab, but I would suggest surgery if remicade does not work.     PLAN:  1.  Continue remicade infusions as planned  2.  Start Uceris Foam twice daily x  Weeks, then 1 once daily x 4 weeks  3.  I will refer you to colorectal surgery to meet the surgeons in case you need surgery in the future  4.  Please keep [name] updated on your progress.  We could try prednisone 60mg  temporarily if needed.    5.  If you feel worse despite UCeris foam, please come to [facility]    IBD health maintenance:  Influenza vaccine: given in Fall 2019  Pneumonia vaccine:   Hepatitis B: received twinrix 2 of 3;   TB testing: Quant gold neg in March 2019  Chickenpox/Shingles history: Shingrix completed 09/2018  Bone denistometry:   Derm appointment:  Last colonoscopy: 10/2019  --------------------------------------------------------  Author: [name] 04/11/2020 9:43 AM    [name], MD  Assistant Professor of Medicine  Division of Gastroenterology & Hepatology  [facility] [place] [facility] [place]  =====================================================      The patient reports they are currently: at home. I spent 12 minutes on the real-time audio and video visit with the patient on the date of service. I spent an additional 10 minutes on pre- and post-visit activities on the date of service.     The patient was not located and I was located within 250 yards of a hospital based location during the real-time audio and video visit. The patient was physically located in [place] or a state in which I am permitted to provide care. The patient and/or parent/guardian understood that s/he may incur co-pays and cost sharing, and agreed to the telemedicine visit. The visit was reasonable and appropriate under the circumstances given the patient's presentation at the time.    The patient and/or parent/guardian has been advised of the potential risks and limitations of this mode of treatment (including, but not limited to, the absence of in-person examination) and has agreed to be treated using telemedicine. The patient's/patient's family's questions regarding telemedicine have been answered.    If the visit was completed in an ambulatory setting, the patient and/or parent/guardian has also been advised to contact their provider???s office for worsening conditions, and seek emergency medical treatment and/or call 911 if the patient deems either necessary.

## 2020-04-13 LAB — HLA-DQA05

## 2020-04-17 ENCOUNTER — Encounter: Admit: 2020-04-17 | Discharge: 2020-04-18 | Payer: PRIVATE HEALTH INSURANCE

## 2020-04-17 DIAGNOSIS — K5130 Ulcerative (chronic) rectosigmoiditis without complications: Principal | ICD-10-CM

## 2020-04-17 NOTE — Unmapped (Signed)
LOWER GASTROINTESTINAL SURGERY CLINIC NOTE    Patient Name: Kevin Tran  Patient MRN: [id number]  Date of Service: 04/17/2020  Attending: [name], MD    Reason for Visit   Evaluation of possible future surgical treatment of the patients refractory ulcerative colitis    Assessment   Kevin Tran is a 60 y.o. male who has refractory ulcerative colitis since 2018    Plan   ?? Discussed surgical planning and questions were answered to patients satisfaction  ?? Contact Dr.Jain and inform them about the meeting    Attending Surgeon A/P     As noted, pleasant 60 yo male patient of Dr [name] and from the [facility] with Dala Dock who is referred by Dr Raphael Gibney for surgical evaluation for medical refractory CUC. Colonoscopies dating back to 2019 have shown moderately active chronic colitis. Most recent FFS by Dr Nadeen Landau on 03/28/20 revealed moderate Mayo 2 colitis up to splenic flexure. Pathology = severe chronic active colitis w/o CMV.       He has tried multiple therapies with breakthrough disease, including humira/6 MP, [name], Uceris Foam, severe flare 06/2019 with re staging and transition to stelara/canasa with recurrent flare 03/2020 requiring prednisone taper, subsequent re admission requiring IV remicaide. He claims to have lost @ 20 lbs in last couple of weeks and continues to experience intermittent abdominal pain and diarrhea.    On exam he looks well but abdominals exam is mildly bloated and tympanitic. No peritoneal signs nor masses palpated. Perianal exam is benign w/o any perianal evidence of CD. DRE does not reveal any masses. Muscle length is 3cm with baseline tonicity and squeeze both 4/4.    The above was reviewed at length with patient and accompanying wife. They have informed me that they are trying Remicade as last medical approach. Will review case with Dr Raphael Gibney. Agree that he is likely to need a staged colectomy with probably a staged approach ultimately leading to an IPAA. He has a virgin abdomen and excellent anal sphincter function.     Will reconvene following the above.       I saw and evaluated the patient, participating in the key portions of the service.?? I reviewed the resident???s note.?? I agree with the resident???s findings and plan. Kevin Page, MD      Subjective   HPI: Kevin Tran is a 60 y.o. male who has ulcerative colitis. Other past medical history significant for pneumothorax. First diagnosed in 2018 following bloody diarrhea and incontinence. He has tried multiple therapies with breakthrough disease, including humira/6 MP, [name], Uceris Foam, severe flare 06/2019 with re staging and transition to stelara/canasa with recurrent flare 03/2020 requiring prednisone taper, subsequent re admission requiring IV remicaide.     Endoscopy history as below, per most recent GI visit with Dr. Raphael Gibney:   - Colonoscopy Feb 2019: edema + erythema in rectum and sigmoid; decreased vascularity in rest of colon  - Dec 2019: moderate inflammation throughout the colon  - Colonoscopy 07/01/2018 - poor prep, moderate proctitis up to 15cm, mild patchy nodularity in remaining colon.  Normal terminal ileum.   - Colonoscopy 10/20/2019 - hemorrhoids.  Moderate (Mayo 2) colitis from anal verge up to 10cm.  Mild colitis from 10-20cm, then normal colon.  Normal terminal ileum.               PATH:  Moderately active chronic colitis  - Sigmoidoscopy 03/28/2020 - moderate (Mayo 2) colitis through the extent  of the exam (up to the descending colon at least).               PATH:  Severe chronic active colitis with no CMV     Patient reports 20 lb weight loss over the last 2 weeks. He has continued symptoms even after discharge from his most recent admission, including multiple bouts of diarrhea per day along with abdominal pain.     Review of Systems: A 12 system review of systems was negative except as noted in the HPI.    Allergies  No Known Allergies  Past Medical History  Past Medical History:   Diagnosis Date   ??? Arthritis    ??? Pneumothorax    ??? Ulcerative colitis (CMS-HCC)      Past Surgical History   Past Surgical History:   Procedure Laterality Date   ??? BACK SURGERY  1996   ??? COLONOSCOPY     ??? PR COLONOSCOPY W/BIOPSY SINGLE/MULTIPLE Left 07/01/2018    Procedure: COLONOSCOPY, FLEXIBLE, PROXIMAL TO SPLENIC FLEXURE; WITH BIOPSY, SINGLE OR MULTIPLE;  Surgeon: [name], MD;  Location: [facility];  Service: Gastroenterology   ??? PR COLONOSCOPY W/BIOPSY SINGLE/MULTIPLE  10/20/2019    Procedure: COLONOSCOPY, FLEXIBLE, PROXIMAL TO SPLENIC FLEXURE; WITH BIOPSY, SINGLE OR MULTIPLE;  Surgeon: Luanne Bras, MD;  Location: [facility]ED[facility];  Service: Gastroenterology   ??? PR SIGMOIDOSCOPY,BIOPSY N/A 03/28/2020    Procedure: SIGMOIDOSCOPY, FLEXIBLE; WITH BIOPSY, SINGLE OR MULTIPLE;  Surgeon: Luanne Bras, MD;  Location: [facility] OR [facility];  Service: Gastroenterology     Medications  Current Outpatient Medications   Medication Sig Dispense Refill   ??? acetaminophen (TYLENOL) 500 MG tablet Take 500 mg by mouth every six (6) hours as needed.      ??? budesonide 2 mg/actuation Foam Insert 1 Dose into the rectum two (2) times a day. 1 metered dose rectally twice daily x 14 days, then once daily x 28 days. 66.8 g 1   ??? fluticasone propionate (FLONASE) 50 mcg/actuation nasal spray 2 sprays into each nostril daily as needed for allergies.      ??? folic acid (FOLVITE) 1 MG tablet Take 1 tablet (1 mg total) by mouth daily. 30 tablet 11   ??? melatonin 3 mg Tab Take 1 tablet (3 mg total) by mouth nightly as needed.  0   ??? multivitamin capsule Take 1 capsule by mouth daily.      ??? omeprazole (PRILOSEC) 20 MG capsule Take 20 mg by mouth daily.      ??? oxyCODONE (ROXICODONE) 10 mg immediate release tablet Take 1 tablet (10 mg total) by mouth every four (4) hours as needed for up to 5 doses. 5 tablet 0   ??? predniSONE (DELTASONE) 10 MG tablet Take 4 tablets (40 mg total) by mouth daily for 7 days, THEN 3 tablets (30 mg total) daily for 7 days, THEN 2 tablets (20 mg total) daily for 7 days, THEN 1 tablet (10 mg total) daily for 7 days. (Patient taking differently: Take 6 tablets (40 mg total) by mouth daily for 7 days, THEN 3 tablets (30 mg total) daily for 7 days, THEN 2 tablets (20 mg total) daily for 7 days, THEN 1 tablet (10 mg total) daily for 7 days.) 70 tablet 0   ??? pregabalin (LYRICA) 225 MG capsule Take 225 mg by mouth Two (2) times a day.     ??? sulfaSALAzine (AZULFIDINE) 500 mg tablet Take 2 tablets (  1,000 mg total) by mouth four (4) times a day. 240 tablet 1   ??? ustekinumab (STELARA) 90 mg/mL Syrg syringe Inject 1 mL (90 mg total) under the skin every twenty-eight (28) days. 1 mL 5     No current facility-administered medications for this visit.     Family History  Family History   Problem Relation Age of Onset   ??? Cancer Mother      Social History  Social History     Socioeconomic History   ??? Marital status: Married     Spouse name: None   ??? Number of children: None   ??? Years of education: None   ??? Highest education level: None   Occupational History   ??? None   Tobacco Use   ??? Smoking status: Former Smoker     Types: Cigarettes   ??? Smokeless tobacco: Never Used   Vaping Use   ??? Vaping Use: Never used   Substance and Sexual Activity   ??? Alcohol use: Yes     Alcohol/week: 2.0 standard drinks     Types: 2 Cans of beer per week   ??? Drug use: No   ??? Sexual activity: Yes     Partners: Female   Other Topics Concern   ??? None   Social History Narrative   ??? None     Social Determinants of Health     Financial Resource Strain: Not on file   Food Insecurity: Not on file   Transportation Needs: Not on file   Physical Activity: Not on file   Stress: Not on file   Social Connections: Not on file     Objective     Vitals:    04/17/20 1052   BP: 126/82   Pulse: 102       Physical Exam:  General: alert, well appearing, in no acute distress  Cardiovascular: regular rate, normotensive  Pulmonary: breathing comfortably on room air  Abdominal: . Mld tenderness in the upper abdomen, the lower abdomen is softer. The patient is a little bloated, but no signs of peritonitis appreciated. Supraclavicular areas appear well, no groin pathology noted.  Extremities: warm and well perfused bilaterally, no edema  Skin: skin color, texture, and turgor are normal. No visible rashes or suspicious lesions  Musculoskeletal: no joint tenderness, deformity, swelling, or muscular tenderness noted. Full range of motion without pain  Neurologic: AAOx3. No visible tremor. Grossly intact neurologically  Psychiatric: alert and oriented to person, place, and time. Judgement and insight are appropriate  Perianal exam: Prominent hemorrhoidal disease, irritation symmetrical of the perianal skin probably secondary to frequent bowel movements, no stigmata of blood noted. Tenderness noted on DRE, baseline tonicity 4/4 and baseline squeeze is 2/4 muscle length is 3cm , no blood on the finger noted

## 2020-04-17 NOTE — Unmapped (Deleted)
New Patient Clinic Note    Date: 04/17/2020  Referred by: [name], MD    Chief Complaint: Ulcerative Collitis  History of Present Illness:    Kevin Tran is a 60 y.o. male with history of ulcerative collitis presenting as a referral from GI for evaluation of possible surgical intervention. Patient reports that ***.       Past Medical History:   Diagnosis Date   ??? Arthritis    ??? Pneumothorax    ??? Ulcerative colitis (CMS-HCC)        Past Surgical History:   Procedure Laterality Date   ??? BACK SURGERY  1996   ??? COLONOSCOPY     ??? PR COLONOSCOPY W/BIOPSY SINGLE/MULTIPLE Left 07/01/2018    Procedure: COLONOSCOPY, FLEXIBLE, PROXIMAL TO SPLENIC FLEXURE; WITH BIOPSY, SINGLE OR MULTIPLE;  Surgeon: [name], MD;  Location: [facility];  Service: Gastroenterology   ??? PR COLONOSCOPY W/BIOPSY SINGLE/MULTIPLE  10/20/2019    Procedure: COLONOSCOPY, FLEXIBLE, PROXIMAL TO SPLENIC FLEXURE; WITH BIOPSY, SINGLE OR MULTIPLE;  Surgeon: Luanne Bras, MD;  Location: [facility] PROCED[facility];  Service: Gastroenterology   ??? PR SIGMOIDOSCOPY,BIOPSY N/A 03/28/2020    Procedure: SIGMOIDOSCOPY, FLEXIBLE; WITH BIOPSY, SINGLE OR MULTIPLE;  Surgeon: Luanne Bras, MD;  Location: [facility] OR [facility];  Service: Gastroenterology        No Known Allergies    Current Outpatient Medications   Medication Sig Dispense Refill   ??? acetaminophen (TYLENOL) 500 MG tablet Take 500 mg by mouth every six (6) hours as needed.      ??? budesonide 2 mg/actuation Foam Insert 1 Dose into the rectum two (2) times a day. 1 metered dose rectally twice daily x 14 days, then once daily x 28 days. 66.8 g 1   ??? fluticasone propionate (FLONASE) 50 mcg/actuation nasal spray 2 sprays into each nostril daily as needed for allergies.      ??? folic acid (FOLVITE) 1 MG tablet Take 1 tablet (1 mg total) by mouth daily. 30 tablet 11   ??? melatonin 3 mg Tab Take 1 tablet (3 mg total) by mouth nightly as needed.  0   ??? multivitamin capsule Take 1 capsule by mouth daily.      ??? omeprazole (PRILOSEC) 20 MG capsule Take 20 mg by mouth daily.      ??? oxyCODONE (ROXICODONE) 10 mg immediate release tablet Take 1 tablet (10 mg total) by mouth every four (4) hours as needed for up to 5 doses. 5 tablet 0   ??? predniSONE (DELTASONE) 10 MG tablet Take 4 tablets (40 mg total) by mouth daily for 7 days, THEN 3 tablets (30 mg total) daily for 7 days, THEN 2 tablets (20 mg total) daily for 7 days, THEN 1 tablet (10 mg total) daily for 7 days. 70 tablet 0   ??? pregabalin (LYRICA) 225 MG capsule Take 225 mg by mouth Two (2) times a day.     ??? sulfaSALAzine (AZULFIDINE) 500 mg tablet Take 2 tablets (1,000 mg total) by mouth four (4) times a day. 240 tablet 1   ??? ustekinumab (STELARA) 90 mg/mL Syrg syringe Inject 1 mL (90 mg total) under the skin every twenty-eight (28) days. 1 mL 5     No current facility-administered medications for this visit.       Family History   Problem Relation Age of Onset   ??? Cancer Mother        Social History  Tobacco Use   ??? Smoking status: Former Smoker     Types: Cigarettes   ??? Smokeless tobacco: Never Used   Vaping Use   ??? Vaping Use: Never used   Substance Use Topics   ??? Alcohol use: Yes     Alcohol/week: 2.0 standard drinks     Types: 2 Cans of beer per week   ??? Drug use: No       Review of Systems:   12 system review of systems negative except as stated in HPI.    Physical Exam:  There were no vitals taken for this visit.   General:  HEENT:  CV:  Resp:  Abd:  Extremities:  Anal exam:      Assessment/Plan:  Kevin Tran is a 60 y.o. male patient ***

## 2020-04-21 ENCOUNTER — Encounter: Admit: 2020-04-21 | Discharge: 2020-04-22 | Payer: PRIVATE HEALTH INSURANCE

## 2020-04-21 LAB — CBC W/ AUTO DIFF
BASOPHILS ABSOLUTE COUNT: 0 10*9/L (ref 0.0–0.1)
BASOPHILS RELATIVE PERCENT: 0.3 %
EOSINOPHILS ABSOLUTE COUNT: 0 10*9/L (ref 0.0–0.5)
EOSINOPHILS RELATIVE PERCENT: 0.1 %
HEMATOCRIT: 43.9 % (ref 39.0–48.0)
HEMOGLOBIN: 14.2 g/dL (ref 12.9–16.5)
LYMPHOCYTES ABSOLUTE COUNT: 0.9 10*9/L — ABNORMAL LOW (ref 1.1–3.6)
LYMPHOCYTES RELATIVE PERCENT: 6.3 %
MEAN CORPUSCULAR HEMOGLOBIN CONC: 32.3 g/dL (ref 32.0–36.0)
MEAN CORPUSCULAR HEMOGLOBIN: 26.1 pg (ref 25.9–32.4)
MEAN CORPUSCULAR VOLUME: 80.9 fL (ref 77.6–95.7)
MEAN PLATELET VOLUME: 7.9 fL (ref 6.8–10.7)
MONOCYTES ABSOLUTE COUNT: 0.6 10*9/L (ref 0.3–0.8)
MONOCYTES RELATIVE PERCENT: 4.6 %
NEUTROPHILS ABSOLUTE COUNT: 12 10*9/L — ABNORMAL HIGH (ref 1.8–7.8)
NEUTROPHILS RELATIVE PERCENT: 88.7 %
NUCLEATED RED BLOOD CELLS: 0 /100{WBCs} (ref <=4)
PLATELET COUNT: 277 10*9/L (ref 150–450)
RED BLOOD CELL COUNT: 5.43 10*12/L (ref 4.26–5.60)
RED CELL DISTRIBUTION WIDTH: 16.9 % — ABNORMAL HIGH (ref 12.2–15.2)
WBC ADJUSTED: 13.6 10*9/L — ABNORMAL HIGH (ref 3.6–11.2)

## 2020-04-21 LAB — ALT: ALT (SGPT): 13 U/L (ref 10–49)

## 2020-04-21 LAB — C-REACTIVE PROTEIN: C-REACTIVE PROTEIN: <4 mg/L (ref <=10.0)

## 2020-04-21 LAB — AST: AST (SGOT): 17 U/L (ref <=34)

## 2020-04-21 MED ADMIN — methylPREDNISolone sodium succinate (PF) (Solu-MEDROL) injection 20 mg: 20 mg | INTRAVENOUS | @ 17:00:00 | Stop: 2020-04-21

## 2020-04-21 MED ADMIN — inFLIXimab (REMICADE) 5 mg/kg = 350 mg in sodium chloride (NS) 250 mL IVPB: 5 mg/kg | INTRAVENOUS | @ 17:00:00 | Stop: 2020-04-21

## 2020-04-21 MED ADMIN — cetirizine (ZyrTEC) tablet 10 mg: 10 mg | ORAL | @ 17:00:00 | Stop: 2020-04-21

## 2020-04-21 NOTE — Unmapped (Signed)
1115 Patient in today for Remicade infusion. Patient has no s/s of infection. No issues from the last infusion.This is loading #2,he got the first dose at the hospital.  1144 Remicade started ,patient instructed to use call bell /call nurse for any s/s of unsual symptoms during infusion. Patient educated on possible s/s of reaction,such as chestpain ,itching,shortness of breath lightheadedness and any kind of discomfort. Patient verbalized understanding.  1345 Infusion completed and well tolerated.  1415 Remicade 350 mg/250 NS infused over 2hrs per protocol . Pt alert and oriented, offered no complaints during infusion. IV flushed with 10ml NS post infusion. Pt tolerated infusion well.  Remicade 2hr protocol  Start of infusion 10ml x                             20ml x                            40ml x                            80ml x                          x                          x 

## 2020-05-01 DIAGNOSIS — K5130 Ulcerative (chronic) rectosigmoiditis without complications: Principal | ICD-10-CM

## 2020-05-08 MED ORDER — NYSTATIN 100,000 UNIT/ML ORAL SUSPENSION
Freq: Four times a day (QID) | ORAL | 1 refills | 6.00000 days | Status: CP
Start: 2020-05-08 — End: 2020-05-14

## 2020-05-08 NOTE — Unmapped (Signed)
Reason For Call:  Received call from pt wife Zella Ball stating Callum has mouth sores. Spoke to pt today     Assessment:   starting sometime last week, he has mouth sores but they are limited to the inside of his lips, top and bottom. He said it is ???covered??? in white bumps but nowhere else in his mouth or his throat, no pain with swallowing. Thinking candidiasis? He also said his top teeth are very sore and he can???t chew because it is so uncomfortable.   He doesn???t feel like the uceris foam is helping ??? he did receive his second dose of infliximab on 04/21/20, next one scheduled for 4/7. He continues to have diarrhea but denies blood, urgency and goes to the bathroom every time he eats/drinks, but no nocturnal bms anymore. Currently on 20mg  of prednisone       Instructions Provided:  Msg sent to Dr Raphael Gibney     Follow Up:  - Patient verbalized understanding and agreement with plan       Workup Time:   

## 2020-05-15 DIAGNOSIS — K5130 Ulcerative (chronic) rectosigmoiditis without complications: Principal | ICD-10-CM

## 2020-05-18 ENCOUNTER — Encounter: Admit: 2020-05-18 | Discharge: 2020-05-19 | Payer: PRIVATE HEALTH INSURANCE

## 2020-05-18 DIAGNOSIS — K51918 Ulcerative colitis, unspecified with other complication: Principal | ICD-10-CM

## 2020-05-18 LAB — CBC W/ AUTO DIFF
BASOPHILS ABSOLUTE COUNT: 0.2 10*9/L — ABNORMAL HIGH (ref 0.0–0.1)
BASOPHILS RELATIVE PERCENT: 1.4 %
EOSINOPHILS ABSOLUTE COUNT: 0 10*9/L (ref 0.0–0.5)
EOSINOPHILS RELATIVE PERCENT: 0.2 %
HEMATOCRIT: 40 % (ref 39.0–48.0)
HEMOGLOBIN: 13 g/dL (ref 12.9–16.5)
LYMPHOCYTES ABSOLUTE COUNT: 1 10*9/L — ABNORMAL LOW (ref 1.1–3.6)
LYMPHOCYTES RELATIVE PERCENT: 7.8 %
MEAN CORPUSCULAR HEMOGLOBIN CONC: 32.5 g/dL (ref 32.0–36.0)
MEAN CORPUSCULAR HEMOGLOBIN: 27.3 pg (ref 25.9–32.4)
MEAN CORPUSCULAR VOLUME: 84 fL (ref 77.6–95.7)
MEAN PLATELET VOLUME: 7.8 fL (ref 6.8–10.7)
MONOCYTES ABSOLUTE COUNT: 0.8 10*9/L (ref 0.3–0.8)
MONOCYTES RELATIVE PERCENT: 5.8 %
NEUTROPHILS ABSOLUTE COUNT: 10.9 10*9/L — ABNORMAL HIGH (ref 1.8–7.8)
NEUTROPHILS RELATIVE PERCENT: 84.8 %
NUCLEATED RED BLOOD CELLS: 0 /100{WBCs} (ref <=4)
PLATELET COUNT: 255 10*9/L (ref 150–450)
RED BLOOD CELL COUNT: 4.76 10*12/L (ref 4.26–5.60)
RED CELL DISTRIBUTION WIDTH: 16.6 % — ABNORMAL HIGH (ref 12.2–15.2)
WBC ADJUSTED: 12.9 10*9/L — ABNORMAL HIGH (ref 3.6–11.2)

## 2020-05-18 LAB — ALT: ALT (SGPT): 17 U/L (ref 10–49)

## 2020-05-18 LAB — AST: AST (SGOT): 18 U/L (ref <=34)

## 2020-05-18 LAB — C-REACTIVE PROTEIN: C-REACTIVE PROTEIN: <4 mg/L (ref <=10.0)

## 2020-05-18 MED ADMIN — inFLIXimab (REMICADE) 5 mg/kg = 350 mg in sodium chloride (NS) 250 mL IVPB: 5 mg/kg | INTRAVENOUS | @ 14:00:00 | Stop: 2020-05-18

## 2020-05-18 MED ADMIN — methylPREDNISolone sodium succinate (PF) (Solu-MEDROL) injection 20 mg: 20 mg | INTRAVENOUS | @ 14:00:00 | Stop: 2020-05-18

## 2020-05-18 MED ADMIN — cetirizine (ZyrTEC) tablet 10 mg: 10 mg | ORAL | @ 14:00:00 | Stop: 2020-05-18

## 2020-05-18 NOTE — Unmapped (Signed)
Patient in today for Remicade infusion. Patient has no s/s of infection. No issues from the last infusion.  This is loading #3,he got the first dose at the hospital.  Remicade started ,patient instructed to use call bell /call nurse for any s/s of unsual symptoms during infusion.   Patient educated on possible s/s of reaction,such as chestpain ,itching,shortness of breath lightheadedness and any kind of discomfort.   Patient verbalized understanding.  @1010  am Infusion completed and well tolerated.  @ 1210 pm Remicade 350 mg/250 NS infused over 2hrs per protocol .   Pt alert and oriented, offered no complaints during infusion.   IV flushed with 10ml NS post infusion. Pt tolerated infusion well.  Remicade 2hr protocol  Start of infusion 10ml x                             20ml x                            40ml x                            80ml x                          x                          x  Pt stayed for 30 min observation-uneventful.  Pt discharged home in stable condition.

## 2020-05-22 MED ORDER — SULFASALAZINE 500 MG TABLET
ORAL_TABLET | Freq: Four times a day (QID) | ORAL | 8 refills | 30 days | Status: CP
Start: 2020-05-22 — End: 2021-05-22

## 2020-05-22 NOTE — Unmapped (Signed)
Received Vm from pt wife requesting refill for sulfasalazine. Refill sent per protocol

## 2020-05-24 LAB — INFLIXIMAB AB: ANTI-INFLIXIMAB AB: <20 U/mL

## 2020-06-06 DIAGNOSIS — K51918 Ulcerative colitis, unspecified with other complication: Principal | ICD-10-CM

## 2020-06-06 NOTE — Unmapped (Signed)
Infliximab level obtained with previous infliximab infusion, DOS 05/18/20.   Infliximab level is 2.6ug/mL, antibodies <20.0 U/mL -       Per Dr Raphael Gibney, he would like to increase patients infliximab to 10mg /kg every 8 weeks with this low infliximab level. TP updated.

## 2020-06-06 NOTE — Unmapped (Signed)
Reason For Call:  Spoke to Josem Kaufmann today regarding her husbands infliximab infusions to discuss low infliximab level and increasing the dose per Dr Raphael Gibney     Assessment:   Per Zella Ball, Mr. Creeden is really not much better. He continues to have diarrhea, up 2-3x overnight and 3x in the morning. Anytime he eats he has urgency and loose stool. He will go without eating if he has to go out of the house. His joint pain is a little better at times with sulfasalazine and he continues taking folic acid. He is no longer taking prednisone.     Aryaan has an app with Dr Raphael Gibney in clinic on 06/13/20 and they will discuss his concerns then.       Workup Time:   10 min

## 2020-06-12 NOTE — Unmapped (Signed)
[facility]   FOLLOW UP NOTE - INFLAMMATORY BOWEL DISEASE  06/14/2020    Demographics:  Kevin Tran is a 60 y.o. year old male    Diagnosis:  Ulcerative Colitis  Disease onset (yr):  2018  Age at onset:  > 10yr old (A3)  Location:  Left-sided (E2)  Behavior:  Colitis  Current Tight Control Scenario:   Maintenance = Biologic/small molecule          HPI / NOTE :     Interval Events:   1.  Last seen 04/11/2020 - decided to give IFX a little more time. He had a low IFX trough level of 2.6. we have increased to 10mg /kg dosing.   2.  CBC, AST, ALT, CRP all ok on 05/18/2020    HPI:   Feeling miserable.  Has been getting progressively worse over the last month.  Diarrhea is more than 10 times per day and several times at night.  He has to go the bathroom immediately after eating.  Cannot do his job at work.  Has episodes of fecal incontinence.  Losing weight about 10 pounds.  No fevers or chills.  No skin rashes.  Some mild joint pains.    Abdominal pain (0-10): mild - moderate  BM a day: 10-15x/day  Consistency: loose  % of stools have blood: 0%  Nocturnal BM: yes  Urgency: yes+, some incontinence  Weight change over last 6 mo: losing 10-12lb  Smoking: no  NSAIDS: avoids    Review of Systems:   Review of systems positive for: fatigue.   Otherwise, the balance of 10 systems is negative.           IBD HISTORY:     Year of disease onset:  2018    Brief IBD Disease Course:  In late 2018 developed bloody diarrhea and incontinence. Colonoscopy in Feb 2019 showed edema and erythema in the rectum and sigmoid with decreased vascularity in the rest of the colon; the TI appeared normal. He was treated with courses of prednisone for much of 2019 and also put on mesalamine without improvement. In Fall 2019 he was started on Humira w/o improvement. In Dec 2019 he had moderate level antibodies to humira; was added; mesalamine was stopped; put on prednisone taper; rapidly improved  - Jan 2020 - stopped mesalamine, used weekly Humira + with initial improvement.   - April 2020 - notable worsening of diarrhea and abdominal pain  Colonoscopy with moderate inflammation mainly in rectum.  Changed to [name] 06/2018.  Had limited response initially, but improved with Uceris foam 09/2018.    - 09/2018 - taper to xeljanz 5mg  BID.  Nov 2020 - ongoing proctitis symptoms, restarted Uceris foam.   - 06/2019 - severe UC flare on [name], plan to restage disease and change therapy  - 10/2019 - moderate proctitis (up to ~20cm), started canasa + Stelara.  Good symptom response to Stelara initially, but had severe arthralgias.   - Feb 2022 - worsening UC flare despite Stelara.  Admitted to [facility], discharged on prednisone taper.  Recurrent symptoms, readmitted 1 week later, started on IV Remicade 5mg /kg in the hospital on 04/06/2020. Low trough level April 2022 - increased dose to 10mg /kg.      Endoscopy:      - Colonoscopy Feb 2019: edema + erythema in rectum and sigmoid; decreased vascularity in rest of colon  - Dec 2019: moderate inflammation throughout the colon  - Colonoscopy 07/01/2018 - poor prep,  moderate proctitis up to 15cm, mild patchy nodularity in remaining colon.  Normal terminal ileum.   - Colonoscopy 10/20/2019 - hemorrhoids.  Moderate (Mayo 2) colitis from anal verge up to 10cm.  Mild colitis from 10-20cm, then normal colon.  Normal terminal ileum.    PATH:  Moderately active chronic colitis  - Sigmoidoscopy 03/28/2020 - moderate (Mayo 2) colitis through the extent of the exam (up to the descending colon at least).    PATH:  Severe chronic active colitis with no CMV    Imaging:    - CT 01/28/18: left sided colitis; liver lesion (see below)  - MRI abdomen to evaluate liver lesion 02/09/18 - aforementioned liver lesion felt to be focal fatty infiltration;  6 month repeat recommended    Prior IBD medications (type, dose, duration, response):  [x]  5-ASAs - Mesalamine  - no improvement; possible paradoxical diarrhea  [x]  Oral corticosteroids - prednisone.  Good response with budesonide foam.   []  Intravenous corticosteroids  []  Antibiotics  [x]  Thiopurines: started in Dec 2019  []  Methotrexate  [x]  Anti-TNF therapies - Humira since Sept or Oct 2019. Low level and low titer Ab in Dec 2019. Increased to weekly dosing. Stopped spring 2020 due to non-response despite good drug levels.   []  Anti-Integrin therapies  []  Anti-Interleukin therapies  [x]  Anti-JAK therapies - Xeljanz 10mg  started 06/2018, tapered to 5mg  bid 09/2018  []  Cyclosporine  []  Clinical trial medication  []  Other (Please specify):    Extraintestinal manifestations:   -joint pains affecting: y, peripheral arthralgias  -eye: n  -skin: n  -oral ulcers :  n  -blood clots: n  -PSC: n  -other: n          Past Medical History:   Past medical history:   Past Medical History:   Diagnosis Date   ??? Arthritis    ??? Pneumothorax    ??? Ulcerative colitis (CMS-HCC)      Past surgical history:   Past Surgical History:   Procedure Laterality Date   ??? BACK SURGERY  1996   ??? COLONOSCOPY     ??? PR COLONOSCOPY W/BIOPSY SINGLE/MULTIPLE Left 07/01/2018    Procedure: COLONOSCOPY, FLEXIBLE, PROXIMAL TO SPLENIC FLEXURE; WITH BIOPSY, SINGLE OR MULTIPLE;  Surgeon: [name], MD;  [facility];  Service: Gastroenterology   ??? PR COLONOSCOPY W/BIOPSY SINGLE/MULTIPLE  10/20/2019    Procedure: COLONOSCOPY, FLEXIBLE, PROXIMAL TO SPLENIC FLEXURE; WITH BIOPSY, SINGLE OR MULTIPLE;  Surgeon: Luanne Bras, MD;  Location: [facility];  Service: Gastroenterology   ??? PR SIGMOIDOSCOPY,BIOPSY N/A 03/28/2020    Procedure: SIGMOIDOSCOPY, FLEXIBLE; WITH BIOPSY, SINGLE OR MULTIPLE;  Surgeon: Luanne Bras, MD;  Location[facility];  Service: Gastroenterology     Family history:   Family History   Problem Relation Age of Onset   ??? Cancer Mother      Social history:   Social History     Socioeconomic History   ??? Marital status: Married Spouse name: None   ??? Number of children: None   ??? Years of education: None   ??? Highest education level: None   Occupational History   ??? None   Tobacco Use   ??? Smoking status: Former Smoker     Types: Cigarettes   ??? Smokeless tobacco: Never Used   Vaping Use   ??? Vaping Use: Never used   Substance and Sexual Activity   ??? Alcohol use: Yes  Alcohol/week: 2.0 standard drinks     Types: 2 Cans of beer per week   ??? Drug use: No   ??? Sexual activity: Yes     Partners: Female   Other Topics Concern   ??? None   Social History Narrative   ??? None     Social Determinants of Health     Financial Resource Strain: Not on file   Food Insecurity: Not on file   Transportation Needs: Not on file   Physical Activity: Not on file   Stress: Not on file   Social Connections: Not on file             Allergies:   No Known Allergies          Medications:     Current Outpatient Medications   Medication Sig Dispense Refill   ??? acetaminophen (TYLENOL) 500 MG tablet Take 500 mg by mouth every six (6) hours as needed.      ??? budesonide 2 mg/actuation Foam Insert 1 Dose into the rectum two (2) times a day. 1 metered dose rectally twice daily x 14 days, then once daily x 28 days. 66.8 g 1   ??? fluticasone propionate (FLONASE) 50 mcg/actuation nasal spray 2 sprays into each nostril daily as needed for allergies.      ??? folic acid (FOLVITE) 1 MG tablet Take 1 tablet (1 mg total) by mouth daily. 30 tablet 11   ??? melatonin 3 mg Tab Take 1 tablet (3 mg total) by mouth nightly as needed.  0   ??? multivitamin capsule Take 1 capsule by mouth daily.      ??? omeprazole (PRILOSEC) 20 MG capsule Take 20 mg by mouth daily.      ??? oxyCODONE (ROXICODONE) 10 mg immediate release tablet Take 1 tablet (10 mg total) by mouth every four (4) hours as needed for up to 5 doses. 5 tablet 0   ??? pregabalin (LYRICA) 225 MG capsule Take 225 mg by mouth Two (2) times a day.     ??? sulfaSALAzine (AZULFIDINE) 500 mg tablet Take 2 tablets (1,000 mg total) by mouth four (4) times a day. 240 tablet 8     No current facility-administered medications for this visit.             Physical Exam:   BP 116/77  - Pulse 77  - Temp 36.8 ??C (98.2 ??F) (Temporal)  - Wt 70.9 kg (156 lb 6.4 oz)  - BMI 23.78 kg/m??     GEN: appears fatigued  LUNGS: CTAB, no wheezes, rales, or rhonchi  CV: S1/S2, RRR, no murmurs  ABD: Soft, mildly tender throughout, no rebound/guarding, nondistended, normoactive bowel sounds, no appreciable organomegaly  Extremities: no cyanosis, clubbing or edema, normal gait  Psych: affect appropriate, A&O x3  SKIN: no visible lesions on face, neck, arms, abdomen          Labs, Data & Indices:     Lab Review:   Lab Results   Component Value Date    WBC 12.9 (H) 05/18/2020    WBC 4.9 08/04/2019    RBC 4.76 05/18/2020    RBC 5.34 08/04/2019    HGB 13.0 05/18/2020    HGB 14.7 08/04/2019     Lab Results   Component Value Date    AST 18 05/18/2020    AST 19 08/04/2019    ALT 17 05/18/2020    ALT 20 08/04/2019    BUN 16 04/08/2020    BUN 14  08/04/2019    Creatinine 0.80 04/08/2020    Creatinine 0.99 08/04/2019    CO2 29.4 04/08/2020    CO2 24 08/04/2019    Albumin 3.1 (L) 04/06/2020    Calcium 10.2 04/08/2020    Calcium 10.0 08/04/2019     No results found for: TSH   ...............................................................................................................................................Marland Kitchen  Modified Mayo Score for Ulcerative Colitis  Stool Frequency:   3 = > 5 more than normal  Rectal bleeding:   0 = None  Physicians Global Assessment:  3 = Severe colitis  Score: 6  Remission <2  ............................................................................................................................................  ORDERS THIS VISIT:       Diagnosis ICD-10-CM Associated Orders   1. 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 Arthralgia, unspecified joint  M25.50            Assessment & Recommendations:   Disease state:    KABIR BRANNOCK is a 60 y.o. male with ulcerative pancolitis (mostly proctosigmoiditis) since 2018. He was started on Humira in 2019. By April 2020, he had a mechanistic non-response to Humira (good drug level but moderate to severe proctitis).  Since then he has had suboptimal response to [place] and Ustekinumab. He had a severe UC flare in Feb 2022 and was admitted to [facility]. We started on Infliximab 04/06/2020 but he is getting substantially worse despite ~10 weeks of Infliximab therapy. He has already met with Dr. Miachel Roux and we will proceed with plan for a staged colectomy + IPAA.  I reviewed this with the patient and his wife in detail and he is interested in surgery at this point.     PLAN:  1.  We will move forward with surgery.  I will contact Dr. Miachel Roux to work on planning and scheduling.     IBD health maintenance:  Influenza vaccine: given in Fall 2019  Pneumonia vaccine:   Hepatitis B: received twinrix 2 of 3;   TB testing: Quant gold neg in March 2019  Chickenpox/Shingles history: Shingrix completed 09/2018  Bone denistometry:   Derm appointment:  Last colonoscopy: 10/2019  --------------------------------------------------------  I personally spent 24 minutes face-to-face and non-face-to-face in the care of this patient, which includes all pre, intra, and post visit time on the date of service.    Author: [name] 06/14/2020 11:28 AM    [name], MD  Assistant Professor of Medicine  Division of Gastroenterology & Hepatology  [facility]  =====================================================.

## 2020-06-13 ENCOUNTER — Encounter: Admit: 2020-06-13 | Discharge: 2020-06-13 | Payer: PRIVATE HEALTH INSURANCE

## 2020-06-13 DIAGNOSIS — K5130 Ulcerative (chronic) rectosigmoiditis without complications: Principal | ICD-10-CM

## 2020-06-13 DIAGNOSIS — M2550 Pain in unspecified joint: Principal | ICD-10-CM

## 2020-06-13 NOTE — Unmapped (Signed)
Franchot Erichsen it was a pleasure seeing you today.  Here is a summary/wrap up from today's visit:     1.  We will move forward with surgery.  I will contact Dr. Miachel Roux to work on planning and scheduling.   2.  If any trouble or symptoms, do not hesitate to call.     [name], MD  Assistant Professor of Medicine  Division of Gastroenterology & Hepatology  [facility]            EXPECTATIONS FOR PATIENT FOLLOW UP AND COMMUNICATION:  -- Follow up Appointments:  Crohn's disease and Ulcerative colitis are serious chronic inflammatory diseases which require close monitoring.  I typically expect to see patients for follow up visits at least every 6 months (or more often if you are having a flare, starting new therapy, etc).  In select cases, patients who are only on aminosalicylates / mesalamine, may be seen once per year for follow ups.      -- Communication:  if you have any questions or concerns, you can communicate with [name] via phone (contact information below) or via myChart.  Note that phone messages are given higher priority and triaged first.  We typically respond to myChart messages within 3 business days (occasionally longer during holidays or vacation times).  For urgent issues, please contact us via phone.      IBD NURSE COORDINATOR CONTACT ??? Neta Mends, RN BSN     Phone: [phone] (direct line)      Fax: [phone]  * For urgent medical concerns after hours or on weekends and holidays, call [phone] and ask to speak to the GI Fellow on call.    * If you have a GI medical question or GI symptoms and would like to speak to your provider's healthcare team, please contact Neta Mends, RN (contact information above) OR you can send the GI healthcare team a message through MyChart at [email or web address]    APPOINTMENT SCHEDULING FOR GI CLINIC AND GI PROCEDURES:  RADIOLOGY - to schedule imaging ordered, please call [phone] opt 1   GI MEDICINE CLINIC [phone] option 1   GI PROCEDURES         [phone] option 2   *To schedule, reschedule, or cancel your GI appointment, please call [phone]. If you are unable to come to an appointment, please notify us as soon as possible, preferably 24 hours in advance. Doing so may allow other patients with urgent needs to be scheduled in a cancelled appointment slot.     TEST RESULTS   If you have a MyChart account, your new results and a provider message will be sent to you through your MyChart account at [email or web address]. For results that require follow-up, a member of your healthcare team will also contact you directly.    PRESCRIPTION REFILL REQUESTS  To request prescription refills, please contact your pharmacy or send your healthcare team a message through your MyChart account at [email or web address]  RECORD REQUESTS  For questions related to medical records, please call Medical Records Release of Information at [phone]  FINANCIAL COUNSELOR   For billing and other financial questions/needs ??? please contact Mee Hives at [phone]. If you need to leave a message, please be sure to leave your full name, date of birth or MR#, best call back # and reason for call.    For educational material and resources:  [email or web address].[email or web address]  [place] COVID19 Vaccine Information: [email or web address]  ===============================================================

## 2020-06-15 NOTE — Unmapped (Signed)
Left voice message for patient asking for a return call.  Need to schedule appointment with Dr. Miachel Roux.

## 2020-06-19 ENCOUNTER — Encounter: Admit: 2020-06-19 | Discharge: 2020-06-19 | Payer: PRIVATE HEALTH INSURANCE

## 2020-06-19 ENCOUNTER — Institutional Professional Consult (permissible substitution): Admit: 2020-06-19 | Discharge: 2020-06-19 | Payer: PRIVATE HEALTH INSURANCE

## 2020-06-19 DIAGNOSIS — K51011 Ulcerative (chronic) pancolitis with rectal bleeding: Principal | ICD-10-CM

## 2020-06-19 DIAGNOSIS — K51918 Ulcerative colitis, unspecified with other complication: Principal | ICD-10-CM

## 2020-06-19 LAB — COMPREHENSIVE METABOLIC PANEL
ALBUMIN: 3.3 g/dL — ABNORMAL LOW (ref 3.4–5.0)
ALKALINE PHOSPHATASE: 54 U/L (ref 46–116)
ALT (SGPT): 58 U/L — ABNORMAL HIGH (ref 10–49)
ANION GAP: 5 mmol/L (ref 5–14)
AST (SGOT): 39 U/L — ABNORMAL HIGH (ref <=34)
BILIRUBIN TOTAL: 0.3 mg/dL (ref 0.3–1.2)
BLOOD UREA NITROGEN: 11 mg/dL (ref 9–23)
BUN / CREAT RATIO: 11
CALCIUM: 9.3 mg/dL (ref 8.7–10.4)
CHLORIDE: 107 mmol/L (ref 98–107)
CO2: 27 mmol/L (ref 20.0–31.0)
CREATININE: 1.02 mg/dL
EGFR CKD-EPI AA MALE: >90 mL/min/{1.73_m2} (ref >=60)
EGFR CKD-EPI NON-AA MALE: 80 mL/min/{1.73_m2} (ref >=60)
GLUCOSE RANDOM: 96 mg/dL (ref 70–179)
POTASSIUM: 4.2 mmol/L (ref 3.4–4.8)
PROTEIN TOTAL: 6.5 g/dL (ref 5.7–8.2)
SODIUM: 139 mmol/L (ref 135–145)

## 2020-06-19 LAB — CBC
HEMATOCRIT: 42.2 % (ref 39.0–48.0)
HEMOGLOBIN: 13.9 g/dL (ref 12.9–16.5)
MEAN CORPUSCULAR HEMOGLOBIN CONC: 32.9 g/dL (ref 32.0–36.0)
MEAN CORPUSCULAR HEMOGLOBIN: 27.3 pg (ref 25.9–32.4)
MEAN CORPUSCULAR VOLUME: 83.1 fL (ref 77.6–95.7)
MEAN PLATELET VOLUME: 8.3 fL (ref 6.8–10.7)
PLATELET COUNT: 339 10*9/L (ref 150–450)
RED BLOOD CELL COUNT: 5.08 10*12/L (ref 4.26–5.60)
RED CELL DISTRIBUTION WIDTH: 15 % (ref 12.2–15.2)
WBC ADJUSTED: 11 10*9/L (ref 3.6–11.2)

## 2020-06-19 LAB — PROTIME-INR
INR: 0.94
PROTIME: 11 s (ref 10.3–13.4)

## 2020-06-19 LAB — APTT
APTT: 34.4 s (ref 24.9–36.9)
HEPARIN CORRELATION: 0.2

## 2020-06-19 MED ORDER — ERYTHROMYCIN 500 MG TABLET
ORAL_TABLET | 0 refills | 0.00000 days | Status: CP
Start: 2020-06-19 — End: 2020-06-19

## 2020-06-19 MED ORDER — NEOMYCIN 500 MG TABLET
ORAL_TABLET | ORAL | 0 refills | 0.00000 days | Status: CP
Start: 2020-06-19 — End: 2020-06-19

## 2020-06-19 NOTE — Unmapped (Signed)
Your surgery is scheduled for 06/29/2020 with Dr. Miachel Roux.  Pre-care will call you the day before your surgery with your arrival time and instructions.    Covid testing is required 48-72 hours prior to surgery.  [facility] accepts the PCR and the NAAT tests.  You can get these done at either [facility] or [facility].  Please fax or email me a copy of the results and bring a copy with you the day of surgery.    I have sent 2 prescriptions for antibiotics to your pharmacy on file. Please take them as directed the day before your surgery.      I have provided you with a carbohydrate drink. Please drink this 2-3 hours prior to your arrival at the hospital.    I have included your bowel prep instructions.      Felizardo Hoffmann, RN  [facility]  Nurse Coordinator for Dr. Elita QuickMiachel Roux  [phone]  Fax: [phone]  [email or web address]

## 2020-06-19 NOTE — Unmapped (Signed)
GI SURGERY CLINIC      Patient Name: Kevin Tran  Patient Age: 60 y.o.  Encounter Date: 06/19/2020  Attending Physician: Laverda Page, MD    Referring Provider:   None Per Patient Pcp  [address] [place],  [name] [address]    Primary Care Provider:   No PCP Per Patient      REASON FOR VISIT:  Medical refractory ulcerative colitis      ASSESSMENT:  Kevin Tran is a 60 year old male with a PMH of ulcerative colitis refractory to several years of medical management who presents to clinic today to discuss surgical interventions.    Attending Surgeon A/P     As noted, returns with persistent medically refractory CUC (Mayo 3 on recent FFS) in man who has lost significant weight but recently stabilized at 157 lbs with alb at 3.3. , hgb 13.3. Slight bump in AST/ALT.    Consented for Robotic, laparoscopic assisted TAC/EI. The risk which include but are not limited to:, fistula formation, bowel obstruction, bleeding, wound infection, prolonged hospital stay, DVT, PE, MI, need to convert to open procedure and for further therapies  etc were discussed at length.  Questions were answered to the patient's satisfaction and they want to proceed with plans as outlined. Will arrange for WOCN preop marking and prolonged postop anticoagulation.    I saw and evaluated the patient, participating in the key portions of the service.?? I reviewed the resident???s note.?? I agree with the resident???s findings and plan. Laverda Page, MD        PLAN:    - Posted and consented for robotic assisted total abdominal colectomy, creation of end ileostomy, possible open/exploratory laparotomy to be done on May 19th. He will require 30 days post-op lovenox for DVT prophylaxis. We will obtain baseline lab work today to evaluate his nutritional status.       HISTORY OF PRESENT ILLNESS:    Kevin Tran is a 60 y.o. male seen in consultation at the request of Dr. Oneita Hurt for evaluation of surgical evaluation due to medically refractory ulcerative colitis.     He follows with Dr. Raphael Gibney in GI Medicine and has been receiving infliximab x3 doses although he remains very symptomatic with moderate abdominal pain and 10-15 loose bowel movements per day. He says he has a constant dull abdominal pain that becomes sharp whenever he eats and anytime he eats, he immediately has several loose bowel movements. He has lost 10-15lbs over the past 6 months. He states that his stools are non-bloody ever since he took Stelara but previously they were always bloody.     In terms of his IBD course, he initially developed bloody diarrhea in 2018 and colonoscopy in February 2019 showed edema and erythema in the rectum and sigmoid colon with a normal appearing TI. He was treated with prednisone during 2019 in addition to mesalamine without improvement. He then was switched to Humira and after failing that, was trialed on [place]. Given continued flares, he was started on Stelara and is now on Remicade after he was hospitalized in February 2022. He had a sigmoidoscopy done in February 2022 which showed moderate (Mayo 2) colitis through the descending colon (extent of examination).         REVIEW OF SYSTEMS:  Denies fevers, chills, sweats, nausea, vomiting, chest pain, shortness of breath, cough, or heart palpitations. Denies lower extremity edema. Denies dysuria, hematuria or urinary incontinence. Denies focal bony pain.  Remainder of a 10 system review of systems is negative.     ALLERGIES: has No Known Allergies.    MEDICATIONS:   Current Outpatient Medications   Medication Sig Dispense Refill   ??? acetaminophen (TYLENOL) 500 MG tablet Take 500 mg by mouth every six (6) hours as needed.      ??? budesonide 2 mg/actuation Foam Insert 1 Dose into the rectum two (2) times a day. 1 metered dose rectally twice daily x 14 days, then once daily x 28 days. 66.8 g 1   ??? fluticasone propionate (FLONASE) 50 mcg/actuation nasal spray 2 sprays into each nostril daily as needed for allergies.      ??? folic acid (FOLVITE) 1 MG tablet Take 1 tablet (1 mg total) by mouth daily. 30 tablet 11   ??? melatonin 3 mg Tab Take 1 tablet (3 mg total) by mouth nightly as needed.  0   ??? multivitamin capsule Take 1 capsule by mouth daily.      ??? omeprazole (PRILOSEC) 20 MG capsule Take 20 mg by mouth daily.      ??? oxyCODONE (ROXICODONE) 10 mg immediate release tablet Take 1 tablet (10 mg total) by mouth every four (4) hours as needed for up to 5 doses. 5 tablet 0   ??? pregabalin (LYRICA) 225 MG capsule Take 225 mg by mouth Two (2) times a day.     ??? sulfaSALAzine (AZULFIDINE) 500 mg tablet Take 2 tablets (1,000 mg total) by mouth four (4) times a day. 240 tablet 8     No current facility-administered medications for this visit.       MEDICAL HISTORY:   Past Medical History:   Diagnosis Date   ??? Arthritis    ??? Pneumothorax    ??? Ulcerative colitis (CMS-HCC)        SURGICAL HISTORY:  Past Surgical History:   Procedure Laterality Date   ??? BACK SURGERY  1996   ??? COLONOSCOPY     ??? PR COLONOSCOPY W/BIOPSY SINGLE/MULTIPLE Left 07/01/2018    Procedure: COLONOSCOPY, FLEXIBLE, PROXIMAL TO SPLENIC FLEXURE; WITH BIOPSY, SINGLE OR MULTIPLE;  Surgeon: [name], MD;  Location: [facility];  Service: Gastroenterology   ??? PR COLONOSCOPY W/BIOPSY SINGLE/MULTIPLE  10/20/2019    Procedure: COLONOSCOPY, FLEXIBLE, PROXIMAL TO SPLENIC FLEXURE; WITH BIOPSY, SINGLE OR MULTIPLE;  Surgeon: Luanne Bras, MD;  [facility];  Service: Gastroenterology   ??? PR SIGMOIDOSCOPY,BIOPSY N/A 03/28/2020    Procedure: SIGMOIDOSCOPY, FLEXIBLE; WITH BIOPSY, SINGLE OR MULTIPLE;  Surgeon: Luanne Bras, MD;  Location: [facility] OR [facility];  Service: Gastroenterology       SOCIAL HISTORY:     reports that he has quit smoking. His smoking use included cigarettes. He has never used smokeless tobacco. He reports current alcohol use of about 2.0 standard drinks of alcohol per week. He reports that he does not use drugs.    FAMILY HISTORY:  family history includes Cancer in his mother.      Objective     Vital signs: There were no vitals taken for this visit.   Wt Readings from Last 3 Encounters:   06/13/20 70.9 kg (156 lb 6.4 oz)   05/18/20 73 kg (161 lb)   04/21/20 72.9 kg (160 lb 12.8 oz)       General Appearance:  No acute distress, ill appearing, and well nourished.   HEENT:  Normocephalic, atraumatic, anicteric sclera.   Cardiovascular: Regular rate and rhythm.  Lungs: Normal work of breathing on room air, no audible wheezing   Abdomen:   Soft, non-distended, diffusely tender to palpation throughout.    Rectal:  Deferred.   Musculoskeletal: Extremities without peripheral edema.   Neurologic: No motor abnormalities noted.  Sensation grossly intact.       DIAGNOSTIC STUDIES:     - All pertinent imaging reviewed.

## 2020-06-19 NOTE — Unmapped (Signed)
WOCN Consult Services  STOMA MARKING AND PRE-OP OSTOMY EDUCATION NOTE    Reason For Consult:  - Ileostomy  - Pre-op Stoma Marking    Problem List:   Active Problems:    * No active hospital problems. *    Assessment:   Per EMR- Kevin Tran is a 60 year old male with a PMH of ulcerative colitis refractory to several years of medical management who presents to clinic today to discuss surgical interventions.  ??    Type of Surgery:  - Ileostomy Date of Surgery:  06/29/2020     The Stoma Marking Purpose And Procedure Was Explained:  Yes    The Patient/Family Member Verbalized Understanding And Agrees To The Marking: Yes    Teaching Limitations/Considerations:  - None noted     Pre-op OstomyTeaching Provided To:  - Patient  - Significant other Method Of Teaching:  - Ostomy teaching booklet  - Demonstration     Ostomy Teaching Points Reviewed:  - Type of ostomy  - Nutrition and hydration changes  - Post-op ostomy teaching goals Patient???s Response To Ostomy Education:  - Verbalized understanding     Rectus Muscle Borders Are Located:  - Yes    Abdominal Contour Evaluation Was Performed In The Following Positions:  - Lying  - Sitting    Abdominal Contours:  Soft, slightly rounded     The Stoma Marking Was Made In The:  - RLQ (Right Lower Quadrant)  - On a flat surface  - Away from creasing  - Within the patient's visual field  - Within the rectus muscles  - Appropriate distance from ribs and umbilicus  - Away from the patient's belt line Pre-op Mark Covered With Thin Transparent Film:  Yes      Plan:  - Patient instructed to maintain mark for OR.    Work Up Time:  30 minutes     Dorris Fetch BSN RN [employer]  [employer]   Pager [phone]

## 2020-06-29 ENCOUNTER — Encounter
Admit: 2020-06-29 | Discharge: 2020-07-02 | Disposition: A | Discharge status: Home / Self Care | Payer: PRIVATE HEALTH INSURANCE | Attending: Student in an Organized Health Care Education/Training Program

## 2020-06-29 ENCOUNTER — Ambulatory Visit
Admit: 2020-06-29 | Discharge: 2020-07-02 | Disposition: A | Discharge status: Home / Self Care | Payer: PRIVATE HEALTH INSURANCE

## 2020-06-29 LAB — BLOOD GAS CRITICAL CARE PANEL, ARTERIAL
BASE EXCESS ARTERIAL: -0.9 (ref -2.0–2.0)
BASE EXCESS ARTERIAL: -1.4 (ref -2.0–2.0)
BASE EXCESS ARTERIAL: -2 (ref -2.0–2.0)
CALCIUM IONIZED ARTERIAL (MG/DL): 4.52 mg/dL (ref 4.40–5.40)
CALCIUM IONIZED ARTERIAL (MG/DL): 4.14 mg/dL — ABNORMAL LOW (ref 4.40–5.40)
CALCIUM IONIZED ARTERIAL (MG/DL): 4.27 mg/dL — ABNORMAL LOW (ref 4.40–5.40)
FIO2 ARTERIAL: 40
FIO2 ARTERIAL: 40
FIO2 ARTERIAL: 40
GLUCOSE WHOLE BLOOD: 90 mg/dL (ref 70–179)
GLUCOSE WHOLE BLOOD: 121 mg/dL (ref 70–179)
GLUCOSE WHOLE BLOOD: 135 mg/dL (ref 70–179)
HCO3 ARTERIAL: 24 mmol/L (ref 22–27)
HCO3 ARTERIAL: 23 mmol/L (ref 22–27)
HCO3 ARTERIAL: 22 mmol/L (ref 22–27)
HEMOGLOBIN BLOOD GAS: 13.8 g/dL
HEMOGLOBIN BLOOD GAS: 12.8 g/dL — ABNORMAL LOW
HEMOGLOBIN BLOOD GAS: 12.7 g/dL — ABNORMAL LOW
LACTATE BLOOD ARTERIAL: 0.9 mmol/L (ref <1.3)
LACTATE BLOOD ARTERIAL: 1.3 mmol/L — ABNORMAL HIGH (ref <1.3)
LACTATE BLOOD ARTERIAL: 1.3 mmol/L — ABNORMAL HIGH (ref <1.3)
O2 SATURATION ARTERIAL: 97.7 % (ref 94.0–100.0)
O2 SATURATION ARTERIAL: 96 % (ref 94.0–100.0)
O2 SATURATION ARTERIAL: 96.2 % (ref 94.0–100.0)
PCO2 ARTERIAL: 46.3 mmHg — ABNORMAL HIGH (ref 35.0–45.0)
PCO2 ARTERIAL: 38.1 mmHg (ref 35.0–45.0)
PCO2 ARTERIAL: 36 mmHg (ref 35.0–45.0)
PH ARTERIAL: 7.34 — ABNORMAL LOW (ref 7.35–7.45)
PH ARTERIAL: 7.39 (ref 7.35–7.45)
PH ARTERIAL: 7.4 (ref 7.35–7.45)
PO2 ARTERIAL: 131 mmHg — ABNORMAL HIGH (ref 80.0–110.0)
PO2 ARTERIAL: 88.1 mmHg (ref 80.0–110.0)
PO2 ARTERIAL: 90.7 mmHg (ref 80.0–110.0)
POTASSIUM WHOLE BLOOD: 4.2 mmol/L (ref 3.4–4.6)
POTASSIUM WHOLE BLOOD: 4.1 mmol/L (ref 3.4–4.6)
POTASSIUM WHOLE BLOOD: 4.1 mmol/L (ref 3.4–4.6)
SODIUM WHOLE BLOOD: 135 mmol/L (ref 135–145)
SODIUM WHOLE BLOOD: 135 mmol/L (ref 135–145)
SODIUM WHOLE BLOOD: 134 mmol/L — ABNORMAL LOW (ref 135–145)

## 2020-06-29 MED ADMIN — calcium chloride 100 mg/mL (10 %) injection: INTRAVENOUS | @ 19:00:00 | Stop: 2020-06-29

## 2020-06-29 MED ADMIN — phenylephrine (NEO-SYNEPHRINE) injection: INTRAVENOUS | @ 18:00:00 | Stop: 2020-06-29

## 2020-06-29 MED ADMIN — calcium chloride 100 mg/mL (10 %) injection: INTRAVENOUS | @ 20:00:00 | Stop: 2020-06-29

## 2020-06-29 MED ADMIN — dexmedeTOMIDine (PRECEDEX) injection: INTRAVENOUS | @ 18:00:00 | Stop: 2020-06-29

## 2020-06-29 MED ADMIN — metroNIDAZOLE (FLAGYL) IVPB 500 mg: 500 mg | INTRAVENOUS | @ 12:00:00 | Stop: 2020-06-29

## 2020-06-29 MED ADMIN — celecoxib (CeleBREX) capsule 200 mg: 200 mg | ORAL | @ 10:00:00 | Stop: 2020-06-29

## 2020-06-29 MED ADMIN — electrolyte-A (PLASMA-LYT A) infusion: INTRAVENOUS | @ 12:00:00 | Stop: 2020-06-29

## 2020-06-29 MED ADMIN — propofoL (DIPRIVAN) injection: INTRAVENOUS | @ 22:00:00 | Stop: 2020-06-29

## 2020-06-29 MED ADMIN — ePHEDrine (PF) 25 mg/5 mL (5 mg/mL) in 0.9% sodium chloride syringe Syrg: INTRAVENOUS | @ 12:00:00 | Stop: 2020-06-29

## 2020-06-29 MED ADMIN — calcium chloride 100 mg/mL (10 %) injection: INTRAVENOUS | @ 17:00:00 | Stop: 2020-06-29

## 2020-06-29 MED ADMIN — midazolam (VERSED) injection: INTRAVENOUS | @ 11:00:00 | Stop: 2020-06-29

## 2020-06-29 MED ADMIN — lidocaine (XYLOCAINE) 20 mg/mL (2 %) injection: INTRAVENOUS | @ 12:00:00 | Stop: 2020-06-29

## 2020-06-29 MED ADMIN — fentaNYL (PF) (SUBLIMAZE) injection: INTRAVENOUS | @ 16:00:00 | Stop: 2020-06-29

## 2020-06-29 MED ADMIN — fentaNYL (PF) (SUBLIMAZE) injection: INTRAVENOUS | @ 18:00:00 | Stop: 2020-06-29

## 2020-06-29 MED ADMIN — ROCuronium (ZEMURON) injection: INTRAVENOUS | @ 21:00:00 | Stop: 2020-06-29

## 2020-06-29 MED ADMIN — ROCuronium (ZEMURON) injection: INTRAVENOUS | @ 15:00:00 | Stop: 2020-06-29

## 2020-06-29 MED ADMIN — electrolyte-A (PLASMA-LYT A) infusion: INTRAVENOUS | @ 19:00:00 | Stop: 2020-06-29

## 2020-06-29 MED ADMIN — ROCuronium (ZEMURON) injection: INTRAVENOUS | @ 17:00:00 | Stop: 2020-06-29

## 2020-06-29 MED ADMIN — electrolyte-A (PLASMA-LYT A) infusion: INTRAVENOUS | @ 17:00:00 | Stop: 2020-06-29

## 2020-06-29 MED ADMIN — fentaNYL (PF) (SUBLIMAZE) injection 25 mcg: 25 ug | INTRAVENOUS | @ 23:00:00 | Stop: 2020-06-29

## 2020-06-29 MED ADMIN — ketamine (KETALAR) injection: INTRAVENOUS | @ 13:00:00 | Stop: 2020-06-29

## 2020-06-29 MED ADMIN — ROCuronium (ZEMURON) injection: INTRAVENOUS | @ 18:00:00 | Stop: 2020-06-29

## 2020-06-29 MED ADMIN — dexamethasone (DECADRON) 4 mg/mL injection: INTRAVENOUS | @ 12:00:00 | Stop: 2020-06-29

## 2020-06-29 MED ADMIN — heparin (porcine) 5,000 unit/mL injection 5,000 Units: 5000 [IU] | SUBCUTANEOUS | @ 11:00:00 | Stop: 2020-06-29

## 2020-06-29 MED ADMIN — ROCuronium (ZEMURON) injection: INTRAVENOUS | @ 12:00:00 | Stop: 2020-06-29

## 2020-06-29 MED ADMIN — bupivacaine liposome (PF) (EXPAREL) 266 mg, sodium chloride (NS) 0.9 % 1 mL infiltration injection: 266 mg | PERINEURAL | @ 11:00:00 | Stop: 2020-06-29

## 2020-06-29 MED ADMIN — ROCuronium (ZEMURON) injection: INTRAVENOUS | @ 16:00:00 | Stop: 2020-06-29

## 2020-06-29 MED ADMIN — electrolyte-A (PLASMA-LYT A) infusion: INTRAVENOUS | @ 16:00:00 | Stop: 2020-06-29

## 2020-06-29 MED ADMIN — bupivacaine (PF) (MARCAINE) 0.25 % (2.5 mg/mL) injection (PF): PERINEURAL | @ 11:00:00 | Stop: 2020-06-29

## 2020-06-29 MED ADMIN — fentaNYL (PF) (SUBLIMAZE) injection: INTRAVENOUS | @ 12:00:00 | Stop: 2020-06-29

## 2020-06-29 MED ADMIN — propofoL (DIPRIVAN) injection: INTRAVENOUS | @ 12:00:00 | Stop: 2020-06-29

## 2020-06-29 MED ADMIN — fentaNYL (PF) (SUBLIMAZE) injection: INTRAVENOUS | @ 17:00:00 | Stop: 2020-06-29

## 2020-06-29 MED ADMIN — ROCuronium (ZEMURON) injection: INTRAVENOUS | @ 19:00:00 | Stop: 2020-06-29

## 2020-06-29 MED ADMIN — lactated Ringers infusion: 10 mL/h | INTRAVENOUS | @ 10:00:00 | Stop: 2020-06-29

## 2020-06-29 MED ADMIN — sugammadex (BRIDION) injection: INTRAVENOUS | @ 23:00:00 | Stop: 2020-06-29

## 2020-06-29 MED ADMIN — ROCuronium (ZEMURON) injection: INTRAVENOUS | @ 13:00:00 | Stop: 2020-06-29

## 2020-06-29 MED ADMIN — ondansetron (ZOFRAN) injection: INTRAVENOUS | @ 22:00:00 | Stop: 2020-06-29

## 2020-06-29 MED ADMIN — cefTRIAXone (ROCEPHIN) 2 g in sodium chloride 0.9 % (NS) 100 mL IVPB-connector bag: 2 g | INTRAVENOUS | @ 12:00:00 | Stop: 2020-06-29

## 2020-06-29 MED ADMIN — metroNIDAZOLE (FLAGYL) IVPB 500 mg: 500 mg | INTRAVENOUS | @ 20:00:00 | Stop: 2020-06-29

## 2020-06-29 MED ADMIN — phenylephrine (NEO-SYNEPHRINE) injection: INTRAVENOUS | @ 12:00:00 | Stop: 2020-06-29

## 2020-06-29 MED ADMIN — acetaminophen (OFIRMEV) 10 mg/mL injection: INTRAVENOUS | @ 20:00:00 | Stop: 2020-06-29

## 2020-06-29 MED ADMIN — dexmedeTOMIDine (PRECEDEX) injection: INTRAVENOUS | @ 22:00:00 | Stop: 2020-06-29

## 2020-06-29 MED ADMIN — phenylephrine 0.8 mg/10 mL (80 mcg/mL) injection: INTRAVENOUS | @ 12:00:00 | Stop: 2020-06-29

## 2020-06-29 MED ADMIN — ROCuronium (ZEMURON) injection: INTRAVENOUS | @ 14:00:00 | Stop: 2020-06-29

## 2020-06-29 MED ADMIN — fentaNYL (PF) (SUBLIMAZE) injection 25 mcg: 25 ug | INTRAVENOUS | Stop: 2020-06-29

## 2020-06-29 MED ADMIN — fentaNYL (PF) (SUBLIMAZE) injection: INTRAVENOUS | @ 22:00:00 | Stop: 2020-06-29

## 2020-06-29 MED ADMIN — phenylephrine (NEO-SYNEPHRINE) injection: INTRAVENOUS | @ 14:00:00 | Stop: 2020-06-29

## 2020-06-29 MED ADMIN — phenylephrine 20 mg in sodium chloride 0.9% 250 mL (80 mcg/mL) infusion PMB: INTRAVENOUS | @ 12:00:00 | Stop: 2020-06-29

## 2020-06-29 MED ADMIN — ROCuronium (ZEMURON) injection: INTRAVENOUS | @ 20:00:00 | Stop: 2020-06-29

## 2020-06-29 NOTE — Unmapped (Addendum)
Kevin Tran is a 60 yo male with medically refractory ulcerative colitis admitted postoperatively. Kevin Tran underwent a total abdominal colectomy with end ileostomy creation on 06/29/2020. He tolerated the procedure well, was extubated in the OR, and was taken to the PACU and received routine postoperative care before being transferred to the floor. He was able to void without difficulty after his urinary catheter was removed***. His diet was slowly advanced and at the time of discharge, he was tolerating a regular diet. Pain medications were transitioned to oral without difficulty. Wound and ostomy nurses taught patient to change his ostomy appliance and he was able to demonstrate this appropriately. He is being discharged in stable condition on 06/30/2020.

## 2020-06-30 LAB — BASIC METABOLIC PANEL
ANION GAP: 5 mmol/L (ref 5–14)
BLOOD UREA NITROGEN: 10 mg/dL (ref 9–23)
BUN / CREAT RATIO: 11
CALCIUM: 8.6 mg/dL — ABNORMAL LOW (ref 8.7–10.4)
CHLORIDE: 105 mmol/L (ref 98–107)
CO2: 27 mmol/L (ref 20.0–31.0)
CREATININE: 0.87 mg/dL
EGFR CKD-EPI AA MALE: >90 mL/min/{1.73_m2} (ref >=60)
EGFR CKD-EPI NON-AA MALE: >90 mL/min/{1.73_m2} (ref >=60)
GLUCOSE RANDOM: 112 mg/dL (ref 70–179)
POTASSIUM: 4.6 mmol/L (ref 3.4–4.8)
SODIUM: 137 mmol/L (ref 135–145)

## 2020-06-30 LAB — CBC
HEMATOCRIT: 36.5 % — ABNORMAL LOW (ref 39.0–48.0)
HEMOGLOBIN: 11.8 g/dL — ABNORMAL LOW (ref 12.9–16.5)
MEAN CORPUSCULAR HEMOGLOBIN CONC: 32.4 g/dL (ref 32.0–36.0)
MEAN CORPUSCULAR HEMOGLOBIN: 27.3 pg (ref 25.9–32.4)
MEAN CORPUSCULAR VOLUME: 84.1 fL (ref 77.6–95.7)
MEAN PLATELET VOLUME: 8.7 fL (ref 6.8–10.7)
PLATELET COUNT: 253 10*9/L (ref 150–450)
RED BLOOD CELL COUNT: 4.34 10*12/L (ref 4.26–5.60)
RED CELL DISTRIBUTION WIDTH: 14.1 % (ref 12.2–15.2)
WBC ADJUSTED: 13.8 10*9/L — ABNORMAL HIGH (ref 3.6–11.2)

## 2020-06-30 LAB — MAGNESIUM: MAGNESIUM: 1.7 mg/dL (ref 1.6–2.6)

## 2020-06-30 LAB — PHOSPHORUS: PHOSPHORUS: 4.2 mg/dL (ref 2.4–5.1)

## 2020-06-30 MED ADMIN — pregabalin (LYRICA) capsule 25 mg: 25 mg | ORAL | @ 12:00:00

## 2020-06-30 MED ADMIN — acetaminophen (TYLENOL) tablet 975 mg: 975 mg | ORAL | @ 02:00:00 | Stop: 2020-07-06

## 2020-06-30 MED ADMIN — oxyCODONE (ROXICODONE) immediate release tablet 5 mg: 5 mg | ORAL | @ 08:00:00 | Stop: 2020-06-30

## 2020-06-30 MED ADMIN — pantoprazole (PROTONIX) EC tablet 40 mg: 40 mg | ORAL | @ 12:00:00

## 2020-06-30 MED ADMIN — lactated Ringers infusion: 100 mL/h | INTRAVENOUS | @ 01:00:00

## 2020-06-30 MED ADMIN — oxyCODONE (ROXICODONE) immediate release tablet 10 mg: 10 mg | ORAL | @ 23:00:00 | Stop: 2020-07-14

## 2020-06-30 MED ADMIN — magnesium sulfate 2gm/50mL IVPB: 2 g | INTRAVENOUS | @ 16:00:00 | Stop: 2020-06-30

## 2020-06-30 MED ADMIN — morphine (1 mg/ml) PCA CADD: INTRAVENOUS | @ 01:00:00 | Stop: 2020-07-13

## 2020-06-30 MED ADMIN — acetaminophen (TYLENOL) tablet 975 mg: 975 mg | ORAL | @ 09:00:00 | Stop: 2020-07-06

## 2020-06-30 MED ADMIN — magnesium sulfate 2gm/50mL IVPB: 2 g | INTRAVENOUS | @ 14:00:00 | Stop: 2020-06-30

## 2020-06-30 MED ADMIN — oxyCODONE (ROXICODONE) immediate release tablet 10 mg: 10 mg | ORAL | @ 12:00:00 | Stop: 2020-07-14

## 2020-06-30 MED ADMIN — acetaminophen (TYLENOL) tablet 975 mg: 975 mg | ORAL | @ 17:00:00 | Stop: 2020-07-06

## 2020-06-30 MED ADMIN — pregabalin (LYRICA) capsule 25 mg: 25 mg | ORAL | @ 19:00:00

## 2020-06-30 MED ADMIN — acetaminophen (TYLENOL) tablet 975 mg: 975 mg | ORAL | @ 23:00:00 | Stop: 2020-07-06

## 2020-06-30 MED ADMIN — MORPhine injection 1 mg: 1 mg | INTRAVENOUS | @ 19:00:00

## 2020-06-30 MED ADMIN — HYDROmorphone (PF) (DILAUDID) injection 0.2 mg: .2 mg | INTRAVENOUS | @ 01:00:00 | Stop: 2020-06-29

## 2020-06-30 MED ADMIN — enoxaparin (LOVENOX) syringe 40 mg: 40 mg | SUBCUTANEOUS | @ 12:00:00

## 2020-06-30 MED ADMIN — MORPhine injection 1 mg: 1 mg | INTRAVENOUS | @ 14:00:00

## 2020-06-30 MED ADMIN — HYDROmorphone (PF) (DILAUDID) injection 0.2 mg: .2 mg | INTRAVENOUS | Stop: 2020-06-29

## 2020-06-30 MED ADMIN — fentaNYL (PF) (SUBLIMAZE) injection 25 mcg: 25 ug | INTRAVENOUS | Stop: 2020-06-29

## 2020-06-30 MED ADMIN — lactated Ringers infusion: 100 mL/h | INTRAVENOUS | @ 10:00:00 | Stop: 2020-06-30

## 2020-06-30 MED ADMIN — oxyCODONE (ROXICODONE) immediate release tablet 10 mg: 10 mg | ORAL | @ 16:00:00 | Stop: 2020-07-14

## 2020-06-30 NOTE — Unmapped (Addendum)
Pt admitted to unit from PACU via stretcher. Alert and oriented x 3 but in and out of drowsiness. Able to follow simple commands. Pt has a new Ileostomy putting small amount watery brown stool to collecting chamber. Foley catheter draining clear yellow urine to SD. Multiple lap sites to abdomen. Per reporting RN, pt received multiple pain medications while in PACU. Currently on PCA Morphine. Re - oriented to using PCA button for pain management. On maintenance fluids with LR at 100 ml/hr cont. Vital signs are stable. Able to move all extremities. Denies numbness or loss of sensation to ext. Wife at bedside visiting. Bed in low position and call light within reach.

## 2020-06-30 NOTE — Unmapped (Signed)
Care Management  Initial Transition Planning Assessment              General Pt underwent  TAC with end ileostomy   Orientation Level: Oriented X4  Functional level prior to admission: Independent  Reason for referral: Discharge Planning    Contact/Decision Maker  Extended Emergency Contact Information  Primary Emergency Contact: Frasier,Robin  Address: [address]           [place], [name] [address] [place]  Home Phone: [phone]  Mobile Phone: [phone]  Relation: Spouse    Legal Next of Kin / Guardian / POA / Advance Directives       Advance Directive (Medical Treatment)  Does patient have an advance directive covering medical treatment?: Patient does not have advance directive covering medical treatment.              Patient Information  Lives with: Spouse/significant other Robin. 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  Past Surgical History:   has a past surgical history that includes Back surgery (1996); Colonoscopy; pr colonoscopy w/biopsy single/multiple (Left, 07/01/2018); pr colonoscopy w/biopsy single/multiple (10/20/2019); and pr sigmoidoscopy,biopsy (N/A, 03/28/2020).   Previous admit date: 04/04/2020    Primary Insurance- Payor: BCBS / Plan: Electrical engineer WITH [employer] / Product Type: *No Product type* /   Secondary Insurance ??? None  Prescription Coverage ???   Preferred Pharmacy - [facility] [place], [place] - [address]  [facility]  [id number] - [place], [place] - [address]  [facility]    Transportation home: Private vehicle          Type of Residence: Private residence        Location/Detail: [place]         Responsibilities/Dependents at home?: No                       Equipment Currently Used at Home: none       Currently receiving outpatient dialysis?: No       Financial Information       Need for financial assistance?: No       Social Determinants of Health  Social Determinants of Health were addressed in provider documentation.  Please refer to patient history.    Discharge Needs Assessment  Concerns to be Addressed:      Clinical Risk Factors:      Barriers to taking medications: No    Prior overnight hospital stay or ED visit in last 90 days: No    Readmission Within the Last 30 Days: no previous admission in last 30 days              Equipment Needed After Discharge: colostomy/ostomy supplies    Discharge Facility/Level of Care Needs:      Readmission  Risk of Unplanned Readmission Score: UNPLANNED READMISSION SCORE: 13%  Predictive Model Details  13% (Medium)  Factor Value    Calculated 06/30/2020 08:04 16% Number of active Rx orders 18    [facility] Risk of Unplanned Readmission Model 14% Number of ED visits in last six months 2     13% Number of hospitalizations in last year 2     12% Latest calcium low (8.6 mg/dL)     9% Imaging order present in last 6 months     8% Latest hemoglobin low (11.8 g/dL)     8% Phosphorous result present     6% Age 60     6% Active anticoagulant Rx order present     5% Charlson Comorbidity Index 3     3% Future appointment scheduled     1% Current length of stay 1.085 days     1% Active ulcer medication Rx order present      Readmitted Within the Last 30 Days? (No if blank)   Patient at risk for readmission?: No    Discharge Plan  Screen findings are: Discharge planning needs identified or anticipated (Comment).    Expected Discharge Date:     Expected Transfer from Critical Care:                 Initial Assessment complete?: Yes

## 2020-06-30 NOTE — Unmapped (Cosign Needed)
Colorectal Surgery Operative Note    Patient Name: Kevin Tran  Date of Birth: [birth year]  MRN: [id number]  CSN: [id number]    Date of Surgery: 06/29/2020    Pre-op Diagnosis: IBD    Post-op Diagnosis: Ulcerative colitis, acute, other complication (CMS-HCC) [K51.918]    Procedure(s):  ROBOTIC XI LAPAROSCOPY, SURGICAL; COLECTOMY, TOTAL, ABDOMINAL, WITHOUT PROCTECTOMY, WITH ILEOSTOMY OR ILEOPROCTOSTOMY: 44210 (CPT??)  ROBOTIC XI LAPAROSCOPY, SURGICAL, ENTEROLYSIS (FREEING OF INTESTINAL ADHESION) (SEPARATE PROCEDURE): 44180 (CPT??)    Performing Service: Gastrointestinal  Surgeon(s) and Role:     * Jose Marcha Dutton, MD - Primary     * Dia Crawford, MD - Resident - Assisting     * Marcille Buffy, MD - Resident - Assisting    Anesthesiology: Anesthesiologist: Jerilee Hoh, MD; Olen Cordial, MD  Anesthesia Technician: Sampson Si  Anesthesia Provider: Barbaraann Cao, CRNA; Alona Bene, CRNA; Shirlean Mylar, CRNA; Janetta Hora, CRNA        Estimated Blood Loss: 50 mL    Complications: None    Specimens:   ID Type Source Tests Collected by Time Destination   1 : Pelvic nodule Tissue Pelvic cavity SURGICAL PATHOLOGY EXAM [name], MD 06/29/2020 1611    2 : Colon Tissue Colon SURGICAL PATHOLOGY EXAM [name], MD 06/29/2020 1700        Implants: None      Anesthesia: General     Findings: colon was slightly friable secondary to known UC. Colon was successfully mobilized without perforation. Recto-sigmoid junction was stapled. Ileum was brought out through RLQ stoma site and end ileostomy created with brook technique.     Indications for Surgery: [name] is a 60 year old male with a PMH of ulcerative colitis refractory to several years of medical management who was seen in clinic and desired to undergo surgical intervention.    Description of the Procedure: The patient was identified in the pre-operative holding area, and brought to the operating room by the anesthesia team. A pre-induction timeout was performed all were in agreement and anesthesia was initiated. Following induction of anesthesia, the patient was prepped and draped in the standard sterile fashion with the arms tucked, caring to pad pressure points. A second pre-incision timeout was performed. All were in agreement, and surgery was initiated.      We made a small incision in the LUQ and passed a Veress needle through it and insufflated the abdomen with carbon dioxide.??We then made a vertical supraumbilical incision for our first port placement. Using the optiview technique, we entered the abdomen atraumatically. The laparoscope was then introuced and no injury to underlying structures were seen. We then placed additional ports under direct visualization, an 8mm port was placed in the RLQ and this was then upsized to a 12mm port. Additional 8mm ports were placed in the RUQ,LLQ, the varus needle was removed and this was exchanged for an 8mm port; an 8mm gas exchange port was placed in the mid-left side, and two additionally 5mm ports were placed in the right mid abdomen. We then docked the robot. ??    We started our dissection by isolating the ileocolic pedicle, the ileocolic vessels were carefully dissected close to their origin and then transected using the laparoscopic vessel sealer. We then systematically worked to mobilize the full right colon. We began mobilizing the ascending colon, which was fairly mobile, taking it down as avascular peritoneal reflection as well as the hepatic flexure.  The omentum was preserved taking it down in the avascular plane from the transverse colon. We then continued working up to the distal third of the transverse colon and then mobilized the descending colon, taking it down along the avascular peritoneal reflection up to the splenic flexure. We then reoriented the robotic arms to focus on the left colon. The descending colon was elevated from its lateral peritoneal reflexion and all attachments taken down.  As we progressed around the colon, the multiple vascular pedicles were each taken with the vessel sealer energy device. We then continued our dissection distally through the sigmoid colon ensuring we took all sigmoidal branches.The colon was then completely mobilized and had no additional attachments.   ??  At this point, the proximal rectum was visualized and was transected with a stapler as it appeared healthy. A Pfannenstiel incision was made above the symphysis pubis the width of the rectus muscle. We dissected down to the anterior fascia which was also opened transversely. Cephalad and caudad fascial flaps were then raised to level of the umbilicus was to symphysis pubis. The peritoneum was entered in the midline without complication. A wound protector was then placed. The entire colon was eviscerated from this incision and the colon was externalized easily. We then used a GIA 80 load to transect the terminal ileum and a locking grasper was placed on the staple line.     The patient was previously marked for an ileostomy and the right midabdominal spot was chosen within the rectus muscle. This was opened with the Bovie cautery. Both the anterior and posterior fascia were opened in a cruciate shape to allow at least 2 fingers to pass to ensure the fascial opening was not too tight for the bowel. The ileum was then delivered through the stoma site without any tension. Laparoscopically, we verified that the ileum was brought up to the abdominal wall without any twisting or kinking.     The abdomen was desufflated and the fascia at the pfannenstiel incision closed with 2 0-PDS suture. The skin and subcutaneous tissue of the pfannenstiel was then closed with 2-0 nylon vertical mattress sutures. An island dressing was placed over this at the end of the case. The LLQ 12mm port site fascia was closed with a figure of eight using a 0-Vicryl suture on a suture passer. The port site incisions had skin closed with 4-0 monocryl and dermabond.     The ileostomy was then matured in the usual Hobart fashion. The ileostomy was then matured in the usual Brooke fashion using 4-0 Chromic sutures and an ostomy appliance was placed.    Sponge and instrument counts were correct at the end of the case. The patient was extubated and transferred to the PACU in stable condition.    Dr. Miachel Roux was present for the duration of the operation.       Dia Crawford, MD  General Surgery PGY2

## 2020-06-30 NOTE — Unmapped (Signed)
Addendum  created 06/30/20 0915 by Cliffton Asters Emaan Gary, MD    Clinical Note Signed

## 2020-06-30 NOTE — Unmapped (Signed)
Brief Operative Note  (CSN: [id number])      Date of Surgery: 06/29/2020    Pre-op Diagnosis: IBD    Post-op Diagnosis: Ulcerative colitis, acute, other complication (CMS-HCC) [K51.918]    Procedure(s):  ROBOTIC XI LAPAROSCOPY, SURGICAL; COLECTOMY, TOTAL, ABDOMINAL, WITHOUT PROCTECTOMY, WITH ILEOSTOMY OR ILEOPROCTOSTOMY: 44210 (CPT??)  ROBOTIC XI LAPAROSCOPY, SURGICAL, ENTEROLYSIS (FREEING OF INTESTINAL ADHESION) (SEPARATE PROCEDURE): 44180 (CPT??)  Note: Revisions to procedures should be made in chart - see Procedures activity.    Performing Service: Gastrointestinal  Surgeon(s) and Role:     * Jose Marcha Dutton, MD - Primary     * Dia Crawford, MD - Resident - Assisting     * Marcille Buffy, MD - Resident - Assisting    Assistant: None    Findings: colon was slightly friable secondary to known UC. Colon was successfully mobilized without perforation. Recto-sigmoid junction was stapled. Ileum was brought out through RLQ stoma site and end ileostomy created with brook technique.     Anesthesia: General    Estimated Blood Loss: 50 mL    Complications: None    Specimens:   ID Type Source Tests Collected by Time Destination   1 : Pelvic nodule Tissue Pelvic cavity SURGICAL PATHOLOGY EXAM [name], MD 06/29/2020 1611    2 : Colon Tissue Colon SURGICAL PATHOLOGY EXAM [name], MD 06/29/2020 1700        Implants: None    Surgeon Notes: Dr. Miachel Roux was present and scrubbed for the entire procedure    Dia Crawford   Date: 06/29/2020  Time: 6:40 PM

## 2020-06-30 NOTE — Unmapped (Cosign Needed)
Lower GI Surgery Progress Note    Service Date: 06/30/2020  Admit Date: 06/29/2020, Hospital Day: 2  Hospital Service: [facility])  Attending: [name], MD    Assessment     Kevin Tran is a 60 y.o. male with medically refractory ulcerative colitis s/p total abdominal colectomy with end ileostomy on 06/29/20    Interval Events   POD#1. No acute events overnight. Pain is well-controlled. Tolerating clears, will advance to surgical GI diet and medlock today. Will remove foley tomorrow. Encouraged OOB/IS    Needs Lovenox teaching at discharge.     Plan     Neuro: pain well-controlled.   -Exparel in OR  -Sch Tylenol     CV: HDS    Pulm: stable on room air    F: ML  E: Repleting Mg  N:Nutrition Therapy Surgical GI; No Straw, No Carbonation  Supplement Adult; Ensure Plus (Standard High Calorie); # of Products PER Serving: 1 3xd PC.    Gi:   - PPI  - Zofran PRN    GU: Foley in place.   -  Adequate urine output    Heme/ID:   - Lovenox for DVT prophylaxis    Disposition: Floor status.     Objective     Vitals:   Temp:  [36.1 ??C-36.7 ??C] 36.1 ??C  Heart Rate:  [76-120] 82  SpO2 Pulse:  [102-119] 102  Resp:  [15-23] 18  BP: (105-152)/(58-125) 110/71  MAP (mmHg):  [74-92] 82  A BP-1: (109-130)/(56-60) 111/57  MAP:  [74 mmHg-81 mmHg] 75 mmHg  SpO2:  [91 %-98 %] 98 %    Physical Exam:  -General:  Appropriate, comfortable and in no apparent distress.   -Cardiovascular: regular rate, normotensive  -Pulmonary: Normal work of breathing on room air  -Abdomen: Soft, non-distended. Incisions c/d/i. Ostomy with dark green liquidoutput.  -Extremities: Warm, well perfused.

## 2020-06-30 NOTE — Unmapped (Signed)
WOCN Consult Services  OSTOMY VISIT NOTE     Reason for Consult:   - Ileostomy  - Initial  - Ostomy Care  - Ostomy Teaching  - Pouching Supplies    Problem List:   Principal Problem:    Ulcerative colitis, acute, other complication (CMS-HCC)  Active Problems:    Rheumatoid arthritis (CMS-HCC)    Assessment: Per VHQ:IONGEXBMWU colitis refractory to several years of medical management. S/P Ileostomy creation   Assessed patient on 6 EST for post operative ileostomy teaching and pouching.  Patient awake, alert, having pain. Medicated by primary nurse.  Pouch edges lifting so pouch change performed. Discussed each step with patient and good feedback obtained.  Patient states he read about having an ostomy before the surgery. Spouse came in at end of pouch change and question and answer session performed.         Stoma Type:  -  Ileostomy Stoma Location:  - RLQ (Right Lower Quadrant)     Stoma Characteristics:  - Round  - Budded Stoma Mucosal Condition and Color:  - Moist  - Red  - Pink     Mucocutaneous Junction:  - Intact    Rod/Stents:  - No     Output:  - Brown  - Green  - Liquid     Peristomal Skin Condition:   - Intact    Abdominal Contours:  - Flat  - Soft    Pouching System:  - 2 Piece  - Flat  - Moldable barrier ring Anticipated Wear Time of Pouching System:  - To be determined     Teaching Limitations/Considerations:   - Anxiety  - Patient sedated  - Patient with uncontrolled pain    Teaching/Instructions:  - Discussed agreed upon goals that the patient/family member would be able to empty and change ostomy pouch, have the instructions needed to order home supplies and know how to trouble shoot pouch leakage and common peristomal skin problems.  - Discussed and demonstrated pouch emptying technique and pouch burping technique to empty flatus from pouch.  - Patient given instruction and provided demonstration of ostomy care including removal of old pouch, cleansing of the peristomal skin, measuring the stoma, cutting out/shaping the wafer, applying moldable barrier ring, and application of wafer/pouch to skin.  - Discussed anticipated pouch change frequency and need to change with any signs the pouch may be leaking.  - Discussed nutrition and need for increased fluids in diet.    Ostomy Home Starter Kit - Verbal Consent Obtained:  - Yes    Recommendations/Plan:   - Patient will need more ostomy teaching prior to discharge, WOC nurse will continue to follow.  - WOC nurse will follow up in 2 days for pouch evaluation.  - Pending discharge ostomy supply list.  - Continue with current pouching system.       Ostomy Discharge Goals:  - More teaching needed Need to change pouch independently.     Recommended Consults:   - Not Applicable Plan of Care Discussed With:  - Patient  - Significant other  - RN Primary     Ostomy Supplies:   - In-patient supplies at the bedside.  - Supplies available on unit.    Ostomy Product List:    Clinical biochemist Fecal Pouch - Blue- (052135/18134)  Copywriter, advertising- (052951/[id number])  [employer] Adhesive Remover Wipes- ((053517/413500)  [employer] Stomahesive Paste [phone] / [id number])  53M No-Sting Barrier Film- Pads- (050338/3344)  Hollister Stoma Powder- (050829/7906)  Hollister Ostomy  Belt 23-43- (050984/7300)  Hy-Tape Waterproof Pink Tape [phone])     WOCN Follow-up:  - Three times weekly    Workup Time:  75 minutes   Reconsult for questions or concerns    Gwendolyn Grant, BSN, RN, [employer]  Page via [facility]  Available by [employer] or [phone]

## 2020-07-01 MED ADMIN — MORPhine injection 1 mg: 1 mg | INTRAVENOUS | @ 16:00:00

## 2020-07-01 MED ADMIN — pregabalin (LYRICA) capsule 50 mg: 50 mg | ORAL | @ 13:00:00

## 2020-07-01 MED ADMIN — melatonin tablet 3 mg: 3 mg | ORAL | @ 04:00:00

## 2020-07-01 MED ADMIN — acetaminophen (TYLENOL) tablet 975 mg: 975 mg | ORAL | @ 10:00:00 | Stop: 2020-07-06

## 2020-07-01 MED ADMIN — oxyCODONE (ROXICODONE) immediate release tablet 10 mg: 10 mg | ORAL | @ 13:00:00 | Stop: 2020-07-14

## 2020-07-01 MED ADMIN — enoxaparin (LOVENOX) syringe 40 mg: 40 mg | SUBCUTANEOUS | @ 13:00:00

## 2020-07-01 MED ADMIN — MORPhine injection 1 mg: 1 mg | INTRAVENOUS | @ 04:00:00

## 2020-07-01 MED ADMIN — oxyCODONE (ROXICODONE) immediate release tablet 10 mg: 10 mg | ORAL | @ 23:00:00 | Stop: 2020-07-14

## 2020-07-01 MED ADMIN — MORPhine injection 1 mg: 1 mg | INTRAVENOUS | @ 08:00:00

## 2020-07-01 MED ADMIN — pregabalin (LYRICA) capsule 50 mg: 50 mg | ORAL | @ 19:00:00

## 2020-07-01 MED ADMIN — simethicone (MYLICON) chewable tablet 80 mg: 80 mg | ORAL | @ 13:00:00

## 2020-07-01 MED ADMIN — cyclobenzaprine (FLEXERIL) tablet 5 mg: 5 mg | ORAL | @ 19:00:00

## 2020-07-01 MED ADMIN — acetaminophen (TYLENOL) tablet 975 mg: 975 mg | ORAL | @ 04:00:00 | Stop: 2020-07-06

## 2020-07-01 MED ADMIN — cyclobenzaprine (FLEXERIL) tablet 5 mg: 5 mg | ORAL | @ 13:00:00

## 2020-07-01 MED ADMIN — pantoprazole (PROTONIX) EC tablet 40 mg: 40 mg | ORAL | @ 13:00:00

## 2020-07-01 MED ADMIN — MORPhine injection 1 mg: 1 mg | INTRAVENOUS

## 2020-07-01 MED ADMIN — pregabalin (LYRICA) capsule 25 mg: 25 mg | ORAL

## 2020-07-01 MED ADMIN — simethicone (MYLICON) chewable tablet 80 mg: 80 mg | ORAL | @ 08:00:00

## 2020-07-01 MED ADMIN — acetaminophen (TYLENOL) tablet 975 mg: 975 mg | ORAL | @ 19:00:00 | Stop: 2020-07-06

## 2020-07-01 NOTE — Unmapped (Signed)
Patient is alert and orientedx4,safety measures are in,call bell is within reach,ambulated in the corri door,tolerated diet,urine out put is adequate,on DVT prophylaxis,PCA discontinued.  Problem: Impaired Wound Healing  Goal: Optimal Wound Healing  Outcome: Progressing  Intervention: Promote Wound Healing  Recent Flowsheet Documentation  Taken 06/30/2020 1348 by Dorene Ar, RN  Activity Management: ambulated in room     Problem: Impaired Wound Healing  Goal: Optimal Wound Healing  Outcome: Progressing  Intervention: Promote Wound Healing  Recent Flowsheet Documentation  Taken 06/30/2020 1348 by Dorene Ar, RN  Activity Management: ambulated in room     Problem: Impaired Wound Healing  Goal: Optimal Wound Healing  Intervention: Promote Wound Healing  Recent Flowsheet Documentation  Taken 06/30/2020 1348 by Marcianne Ozbun Kassie Mends, RN  Activity Management: ambulated in room     Problem: Adult Inpatient Plan of Care  Goal: Plan of Care Review  Outcome: Progressing  Goal: Patient-Specific Goal (Individualized)  Outcome: Progressing  Goal: Absence of Hospital-Acquired Illness or Injury  Outcome: Progressing  Intervention: Prevent and Manage VTE (Venous Thromboembolism) Risk  Recent Flowsheet Documentation  Taken 06/30/2020 1348 by Elya Diloreto Kassie Mends, RN  Activity Management: ambulated in room  Goal: Optimal Comfort and Wellbeing  Outcome: Progressing  Goal: Readiness for Transition of Care  Outcome: Progressing  Goal: Rounds/Family Conference  Outcome: Progressing     Problem: Adult Inpatient Plan of Care  Goal: Patient-Specific Goal (Individualized)  Outcome: Progressing     Problem: Adult Inpatient Plan of Care  Goal: Absence of Hospital-Acquired Illness or Injury  Outcome: Progressing  Intervention: Prevent and Manage VTE (Venous Thromboembolism) Risk  Recent Flowsheet Documentation  Taken 06/30/2020 1348 by Devetta Hagenow Kassie Mends, RN  Activity Management: ambulated in room     Problem: Adult Inpatient Plan of Care  Goal: Absence of Hospital-Acquired Illness or Injury  Intervention: Prevent and Manage VTE (Venous Thromboembolism) Risk  Recent Flowsheet Documentation  Taken 06/30/2020 1348 by Zyanna Leisinger Kassie Mends, RN  Activity Management: ambulated in room     Problem: Pain Acute  Goal: Acceptable Pain Control and Functional Ability  Outcome: Progressing     Problem: Adult Inpatient Plan of Care  Goal: Rounds/Family Conference  Outcome: Progressing     Problem: Adult Inpatient Plan of Care  Goal: Readiness for Transition of Care  Outcome: Progressing     Problem: Adult Inpatient Plan of Care  Goal: Optimal Comfort and Wellbeing  Outcome: Progressing     Problem: Self-Care Deficit  Goal: Improved Ability to Complete Activities of Daily Living  Outcome: Progressing     Problem: Pain Acute  Goal: Acceptable Pain Control and Functional Ability  Outcome: Progressing     Problem: Pain Acute  Goal: Acceptable Pain Control and Functional Ability  Outcome: Progressing     Problem: Fall Injury Risk  Goal: Absence of Fall and Fall-Related Injury  Outcome: Progressing     Problem: Bowel Elimination Management  Goal: Effective Bowel Elimination/Continence  Outcome: Progressing

## 2020-07-01 NOTE — Unmapped (Signed)
Lower GI Surgery Progress Note    Service Date: 07/01/2020  Admit Date: 06/29/2020, Hospital Day: 3  Hospital Service: [facility])  Attending: [name], MD    Assessment     Kevin Tran is a 60 y.o. male with medically refractory ulcerative colitis s/p total abdominal colectomy with end ileostomy on 06/29/20    Interval Events   POD#2. Pt complaining of gas pain overnight. Tried surgical GI diet last night but now hesitant due to gas pain. Encouraged OOB/ambulating. Will remove foley/TOV.     Sch flexeril and Lyrica 50mg  TID added for pain control.     Needs Lovenox teaching at discharge.     Plan   Neuro: pain well-controlled.   -Exparel in OR  -Sch Tylenol, flexeril, Lyrica  -PRN morphine and oxy     CV: HDS    Pulm: stable on room air    F: ML  E: No labs  N:Nutrition Therapy Surgical GI; No Straw, No Carbonation  Supplement Adult; Ensure Plus (Standard High Calorie); # of Products PER Serving: 1 3xd PC.    Gi:   - PPI  - Zofran PRN    GU:   - Remove foley today and TOV    Heme/ID:   - Lovenox for DVT prophylaxis    Disposition: Floor status.     Objective     Vitals:   Temp:  [36.1 ??C-37 ??C] 36.5 ??C  Heart Rate:  [69-92] 76  Resp:  [18-20] 18  BP: (110-138)/(68-72) 126/72  MAP (mmHg):  [76-99] 76  SpO2:  [96 %-98 %] 98 %    Physical Exam:  -General:  Appropriate, comfortable and in no apparent distress.   -Cardiovascular: regular rate, normotensive  -Pulmonary: Normal work of breathing on room air  -Abdomen: Soft, non-distended. Incisions c/d/i. Ostomy with minimal bowel sweat  -Extremities: Warm, well perfused.

## 2020-07-01 NOTE — Unmapped (Signed)
Pt AAOx4, VSS. Pt c/o pain, reports managed on current regimen. Pt ambulated a few laps in hallway. Pt tolerating diet, denies n/v. Ostomy with brown output, stoma pink/red &moist. Foley with adequate UOP. Safety precautions maintained, call bell within reach.       Problem: Impaired Wound Healing  Goal: Optimal Wound Healing  Outcome: Progressing  Intervention: Promote Wound Healing  Recent Flowsheet Documentation  Taken 06/30/2020 2040 by Drue Stager, RN  Activity Management: ambulated outside room     Problem: Adult Inpatient Plan of Care  Goal: Plan of Care Review  Outcome: Progressing  Goal: Patient-Specific Goal (Individualized)  Outcome: Progressing  Goal: Absence of Hospital-Acquired Illness or Injury  Outcome: Progressing  Intervention: Identify and Manage Fall Risk  Recent Flowsheet Documentation  Taken 06/30/2020 2017 by Drue Stager, RN  Safety Interventions:   low bed   nonskid shoes/slippers when out of bed  Intervention: Prevent and Manage VTE (Venous Thromboembolism) Risk  Recent Flowsheet Documentation  Taken 06/30/2020 2040 by Drue Stager, RN  Activity Management: ambulated outside room  Goal: Optimal Comfort and Wellbeing  Outcome: Progressing  Goal: Readiness for Transition of Care  Outcome: Progressing  Goal: Rounds/Family Conference  Outcome: Progressing     Problem: Pain Acute  Goal: Acceptable Pain Control and Functional Ability  Outcome: Progressing     Problem: Bowel Elimination Management  Goal: Effective Bowel Elimination/Continence  Outcome: Progressing

## 2020-07-02 MED ORDER — OXYCODONE 5 MG TABLET
ORAL_TABLET | ORAL | 0 refills | 4 days | Status: CP | PRN
Start: 2020-07-02 — End: ?

## 2020-07-02 MED ORDER — ACETAMINOPHEN 500 MG TABLET
ORAL_TABLET | Freq: Four times a day (QID) | ORAL | 0 refills | 10 days | Status: CP | PRN
Start: 2020-07-02 — End: ?
  Filled 2020-07-02: qty 20, 3d supply, fill #0
  Filled 2020-07-02: qty 40, 10d supply, fill #0

## 2020-07-02 MED ADMIN — cyclobenzaprine (FLEXERIL) tablet 5 mg: 5 mg | ORAL

## 2020-07-02 MED ADMIN — oxyCODONE (ROXICODONE) immediate release tablet 10 mg: 10 mg | ORAL | @ 13:00:00 | Stop: 2020-07-02

## 2020-07-02 MED ADMIN — cyclobenzaprine (FLEXERIL) tablet 5 mg: 5 mg | ORAL | @ 18:00:00 | Stop: 2020-07-02

## 2020-07-02 MED ADMIN — enoxaparin (LOVENOX) syringe 40 mg: 40 mg | SUBCUTANEOUS | @ 13:00:00 | Stop: 2020-07-02

## 2020-07-02 MED ADMIN — acetaminophen (TYLENOL) tablet 975 mg: 975 mg | ORAL | @ 16:00:00 | Stop: 2020-07-02

## 2020-07-02 MED ADMIN — pregabalin (LYRICA) capsule 50 mg: 50 mg | ORAL

## 2020-07-02 MED ADMIN — MORPhine injection 1 mg: 1 mg | INTRAVENOUS | @ 02:00:00

## 2020-07-02 MED ADMIN — acetaminophen (TYLENOL) tablet 975 mg: 975 mg | ORAL | @ 09:00:00 | Stop: 2020-07-02

## 2020-07-02 MED ADMIN — cyclobenzaprine (FLEXERIL) tablet 5 mg: 5 mg | ORAL | @ 13:00:00 | Stop: 2020-07-02

## 2020-07-02 MED ADMIN — oxyCODONE (ROXICODONE) immediate release tablet 10 mg: 10 mg | ORAL | @ 09:00:00 | Stop: 2020-07-02

## 2020-07-02 MED ADMIN — pregabalin (LYRICA) capsule 50 mg: 50 mg | ORAL | @ 18:00:00 | Stop: 2020-07-02

## 2020-07-02 MED ADMIN — pregabalin (LYRICA) capsule 50 mg: 50 mg | ORAL | @ 13:00:00 | Stop: 2020-07-02

## 2020-07-02 MED ADMIN — oxyCODONE (ROXICODONE) immediate release tablet 10 mg: 10 mg | ORAL | @ 04:00:00 | Stop: 2020-07-14

## 2020-07-02 MED ADMIN — simethicone (MYLICON) chewable tablet 80 mg: 80 mg | ORAL

## 2020-07-02 MED ADMIN — pantoprazole (PROTONIX) EC tablet 40 mg: 40 mg | ORAL | @ 13:00:00 | Stop: 2020-07-02

## 2020-07-02 MED ADMIN — acetaminophen (TYLENOL) tablet 975 mg: 975 mg | ORAL | Stop: 2020-07-06

## 2020-07-02 MED ADMIN — melatonin tablet 3 mg: 3 mg | ORAL

## 2020-07-02 NOTE — Unmapped (Signed)
[facility] Lower Gastrointestinal Surgery  Discharge Summary     Identifying Information: Kevin Tran  07/10/1960  [id number]   Code Status: Full Code   PCP:  [facility]   Primary Surgeon:  Primary: [name], MD  Resident - Assisting: Dia Crawford, MD; Marcille Buffy, MD       Admit Date: 06/29/2020    Discharge Date: 07/02/2020    Discharge Attending: [name], MD   Discharge Service:  [facility])   Discharge To:  Home      Primary Discharge Diagnosis:  Principal Problem:    Ulcerative colitis, acute, other complication (CMS-HCC)  Active Problems:    Rheumatoid arthritis (CMS-HCC)    Operation: robotic total abdominal colectomy with end ileostomy    Hospital Course:  Mr. Gosselin is a 60 yo male with medically refractory ulcerative colitis admitted postoperatively. Kevin Tran underwent a total abdominal colectomy with end ileostomy creation on 06/29/2020. He tolerated the procedure well, was extubated in the OR, and was taken to the PACU and received routine postoperative care before being transferred to the floor. He was able to void without difficulty after his urinary catheter was removed. His diet was slowly advanced and at the time of discharge, he was tolerating a regular diet. Pain medications were transitioned to oral without difficulty. Wound and ostomy nurses taught patient to change his ostomy appliance and he was able to demonstrate this appropriately. He is being discharged in stable condition on 07/02/2020.     Condition at Discharge: Good. The patient is back to their functional baseline and is self-managing all activites of daily living.    Discharge Day Note:   Assessment and Discharge Day Services:  The patient was seen and examined by the [facility]) team on the day of discharge. Vital signs and laboratory values were assessed. Surgical wounds were inspected. The discharge plan was discussed, instructions were given, and all questions answered.     Subjective:  No acute events overnight. The patient reports adequate pain control and denies fever or chills. The patient continues to pass flatus and have appropriate ostomy output.    Objective:   BP 119/77  - Pulse 75  - Temp 36.5 ??C (Oral)  - Resp 20  - Wt 69 kg (152 lb 3.2 oz)  - SpO2 100%  - BMI 23.14 kg/m??     General Appearance: alert, awake, well appearing, in no acute distress.  Abdomen: soft, nondistended and nontender to palpation. Ostomy site pink/viable with stool in bag. Pfannenstiel incision with non-absorbable sutures in places, no erythema or drainage. Port sites c/d/i with overlying Dermabond  Discharge Medications:      Your Medication List      START taking these medications    acetaminophen 500 MG tablet  Commonly known as: TYLENOL  Take 1 tablet (500 mg total) by mouth every six (6) hours as needed for pain.     enoxaparin 40 mg/0.4 mL Syrg  Commonly known as: LOVENOX  Inject 0.4 mL (40 mg total) under the skin in the morning for 27 days.  Start taking on: Jul 03, 2020     oxyCODONE 5 MG immediate release tablet  Commonly known as: ROXICODONE  Take 1 tablet (5 mg total) by mouth every four (4) hours as needed for pain not well-controlled by Tylenol.        CONTINUE taking these medications    fluticasone propionate 50 mcg/actuation nasal  spray  Commonly known as: FLONASE  2 sprays into each nostril daily as needed for allergies.     melatonin 3 mg Tab  Take 1 tablet (3 mg total) by mouth nightly as needed.     multivitamin per tablet  Commonly known as: TAB-A-VITE/THERAGRAN  Take 1 tablet by mouth daily.     omeprazole 20 MG tablet  Commonly known as: PriLOSEC OTC  Take 20 mg by mouth daily.     pregabalin 100 MG capsule  Commonly known as: LYRICA  Take 100 mg by mouth Two (2) times a day.          Pending Test Results:  Pending Labs     Order Current Status    Surgical pathology exam In process        Discharge Instructions:  Follow Up instructions and Outpatient Referrals     Discharge instructions          Reasons to call after discharge:  - You have a fever with a temperature of more than 101.5 F.  - You have new nausea and vomiting that you did not have in the hospital, or you have worsening nausea and vomiting than what you were experiencing in the hospital.  - You are not eating or drinking because of having nausea and vomiting.  - Your pain is getting worse and not improving.  - Your ostomy has more than 1 L (liter) of stool output in 24 hours.  - You feel dehydrated, meaning your urine is very dark, and/or you feel light-headed, dizzy and weak.  - Your incision is turning red, has opened up, has new drainage, or is excessively bleeding.   - Your drain fell out, starts to leak, or stops working.   - You have any new or lingering concerns and are not sure if you should be worried about them.    Who to contact after discharge:  For all questions/concerns during business hours (Monday - Friday between 8:00 AM - 4:30 PM) or to schedule a follow up appointment, call your Nurse Coordinator:  - For Dr. Miachel Roux: Felizardo Hoffmann at [phone]  - For Dr. Milbert Coulter: Everlene Balls at [phone]  - For Dr. Neysa Hotter: Everlene Balls at [phone]    If you are unable to reach your Nurse Coordinator directly during business hours, you may call the GI Surgery Clinic at [phone].    For URGENT CONCERNS after business hours (Monday - Friday after 4:30 PM), call the [facility] Operator at [phone] and ask for the Surgical Resident On-Call. You will be directed to a surgery resident who is likely not familiar with your specific care, but can help you deal with issues that cannot wait until regular business hours. Please be aware that the person is responding to many in-hospital emergencies and patient issues, and may not answer your phone call immediately.    For ostomy questions or concerns during business hours (Monday - Friday between 8:00 AM - 4:30 PM), call the GI Surgery Clinic at [phone] to speak with or schedule an appointment with a Wound, Ostomy, and Continence Nurse (WOCN).    Future Appointments:  Appointments which have been scheduled for you    Jul 17, 2020  1:00 PM  (Arrive by 12:45 PM)  INFUSION-RENFLEXSIS UNCTIF with UNCTIF 19, UNCTIF RN 2  [facility] THERAPEUTIC INFUSION [facility] REGION) [address]  [name] [address]  [phone]        Jul 24, 2020 10:30 AM  (Arrive by 10:00 AM)  RETURN  GENERAL with [name], MD  [facility] MULTISPECIALTY SURGERY GI SURGERY [facility] REGION) [address]  [name] [address]  [phone]        Jul 24, 2020 11:00 AM  (Arrive by 10:30 AM)  OSTOMY with SURG [facility]  [facility] MULTISPECIALTY SURGERY GI SURGERY [facility] REGION) [address]  [name] [address]  [phone]        Sep 11, 2020 10:00 AM  (Arrive by 9:45 AM)  INFUSION-RENFLEXSIS UNCTIF with UNCTIF 11, UNCTIF RN 4  [facility] THERAPEUTIC INFUSION [facility] REGION) [address]  [name] [address]  [phone]             I personally spent more than 30 minutes on discharge planning services.  Marcille Buffy

## 2020-07-02 NOTE — Unmapped (Signed)
WOCN Consult Services  OSTOMY VISIT NOTE     Reason for Consult:   - Ileostomy  - Initial  - Ostomy Care  - Ostomy Teaching  - Pouching Supplies    Problem List:   Principal Problem:    Ulcerative colitis, acute, other complication (CMS-HCC)  Active Problems:    Rheumatoid arthritis (CMS-HCC)    Assessment: 60 yo male with medically refractory ulcerative colitis admitted postoperatively. Kevin Tran underwent a total abdominal colectomy with end ileostomy creation on 06/29/2020.    CWOCN received call from Dr. Allena Katz re: plans for patient's discharge today and need for final teach.  Patient was able to complete pouch change with wife observing and CWOCN providing step by step instructions and guidance.  All teaching material reviewed.  All discharge supplies obtained and reviewed.  They state that they feel comfortable with ostomy care.      Stoma Type:  -  Ileostomy Stoma Location:  - RLQ (Right Lower Quadrant)     Stoma Characteristics:  - Round  - Budded Stoma Mucosal Condition and Color:  - Moist  - Red  - Pink     Mucocutaneous Junction:  - Intact    Rod/Stents:  - No     Output:  - Brown  - Green  - Liquid     Peristomal Skin Condition:   - Intact    Abdominal Contours:  - Flat  - Soft    Pouching System:  - 2 Piece  - Flat  - CTF (Cut to fit)  - Moldable barrier ring  - Ostomy belt  - belt for PRN wear Anticipated Wear Time of Pouching System:  - 3 to 5 days  - To be determined     Teaching Limitations/Considerations:   - none noted    Teaching/Instructions:  - Discussed agreed upon goals that the patient/family member would be able to empty and change ostomy pouch, have the instructions needed to order home supplies and know how to trouble shoot pouch leakage and common peristomal skin problems.  - Discussed and demonstrated pouch emptying technique and pouch burping technique to empty flatus from pouch.  - Patient given instruction and provided demonstration of ostomy care including removal of old pouch, cleansing of the peristomal skin, measuring the stoma, cutting out/shaping the wafer, applying moldable barrier ring, and application of wafer/pouch to skin.  - Discussed and demonstrated the technique of crusting for peristomal skin irritation.  - Discussed and demonstrated how to shave peristomal hair.  - Discussed anticipated pouch change frequency and need to change with any signs the pouch may be leaking.  - Discussed activities of daily living, bathing, diet and preventive measures regarding dehydration.  - Reviewed when to contact the outpatient WOC nurse with questions/concerns.  - reviewed discharge supplies    Ostomy Home Starter Kit - Verbal Consent Obtained:  - Yes  - Enrolled in ostomy home starter kit    Recommendations/Plan:   - Final discharge ostomy supply list.  - Patient has met ostomy teaching goals.  - Continue with current pouching system.       Ostomy Discharge Goals:  - Patient/family member able to empty pouch independently.  - Patient/family member able to change pouch independently.  - Patient/family member able to identify ostomy supplies to take home at discharge and knowledge of supply order process.     Recommended Consults:   - Not Applicable Plan of Care Discussed With:  - Patient  - Significant other  -  RN Primary  - Dr. Kathi Simpers Supplies:   - Take home supplies at the bedside.  - Supplies available on unit.    Ostomy Product List:    Environmental consultant (Extended Wear) - Red-(050811/14603)  Hollister 2-Piece Pouch - Red- (050822/18003)  Copywriter, advertising- (052951/120307)  [employer]va[employer]ec Adhesive Remover Wipes- ((053517/413500)  10M No-Sting Barrier Film- Pads- (050338/3344)  Hollister Stoma Powder- (050829/7906)  Hollister Ostomy [place] 23-43- (050984/7300)      WOCN Follow-up:  - We will sign off at this time    Workup Time:  75 minutes     Charissa Bash, RN, BSN, [employer], [employer]  Wound Ostomy Consult Service

## 2020-07-02 NOTE — Unmapped (Signed)
Patient stated adequate pain control at this time.  PRN's given as ordered no complaints of nausea vomiting or diarrhea, denies SOB, patient is ambulatory no episodes of fall noted. no s/s of bleeding noted. Incision are CDI.  Patient has adequate urine output for this shift.  Patient tolerating current diet well.  Patient is currently afebrile no s/s of infection noted.  will continue to monitor.      Problem: Impaired Wound Healing  Goal: Optimal Wound Healing  Outcome: Progressing  Intervention: Promote Wound Healing  Recent Flowsheet Documentation  Taken 07/02/2020 0021 by Charlsie Quest, RN  Pain Management Interventions: pain management plan reviewed with patient/caregiver     Problem: Adult Inpatient Plan of Care  Goal: Plan of Care Review  Outcome: Progressing  Goal: Patient-Specific Goal (Individualized)  Outcome: Progressing  Goal: Absence of Hospital-Acquired Illness or Injury  Outcome: Progressing  Intervention: Identify and Manage Fall Risk  Flowsheets (Taken 07/02/2020 0021)  Safety Interventions: low bed  Intervention: Prevent and Manage VTE (Venous Thromboembolism) Risk  Flowsheets (Taken 07/02/2020 0021)  VTE Prevention/Management: anticoagulant therapy  Goal: Optimal Comfort and Wellbeing  Outcome: Progressing  Intervention: Monitor Pain and Promote Comfort  Flowsheets (Taken 07/02/2020 0021)  Pain Management Interventions: pain management plan reviewed with patient/caregiver  Goal: Readiness for Transition of Care  Outcome: Progressing  Goal: Rounds/Family Conference  Outcome: Progressing     Problem: Pain Acute  Goal: Acceptable Pain Control and Functional Ability  Outcome: Progressing  Intervention: Develop Pain Management Plan  Recent Flowsheet Documentation  Taken 07/02/2020 0021 by Charlsie Quest, RN  Pain Management Interventions: pain management plan reviewed with patient/caregiver     Problem: Bowel Elimination Management  Goal: Effective Bowel Elimination/Continence  Outcome: Progressing     Problem: Fall Injury Risk  Goal: Absence of Fall and Fall-Related Injury  Outcome: Progressing  Intervention: Promote Injury-Free Environment  Recent Flowsheet Documentation  Taken 07/02/2020 0021 by Charlsie Quest, RN  Safety Interventions: low bed

## 2020-07-02 NOTE — Unmapped (Signed)
Patient is alert and orientedx4,safety measures are in,call bell is within reach,ambulated in the corridoor,foleys removed,TOV passed.,abdominal dressing in place,tolerated diet.on DVT prophylaxis.  Problem: Impaired Wound Healing  Goal: Optimal Wound Healing  Outcome: Progressing     Problem: Impaired Wound Healing  Goal: Optimal Wound Healing  Outcome: Progressing     Problem: Adult Inpatient Plan of Care  Goal: Plan of Care Review  Outcome: Progressing  Goal: Patient-Specific Goal (Individualized)  Outcome: Progressing  Goal: Absence of Hospital-Acquired Illness or Injury  Outcome: Progressing  Goal: Optimal Comfort and Wellbeing  Outcome: Progressing  Goal: Readiness for Transition of Care  Outcome: Progressing  Goal: Rounds/Family Conference  Outcome: Progressing     Problem: Adult Inpatient Plan of Care  Goal: Absence of Hospital-Acquired Illness or Injury  Outcome: Progressing     Problem: Adult Inpatient Plan of Care  Goal: Optimal Comfort and Wellbeing  Outcome: Progressing     Problem: Adult Inpatient Plan of Care  Goal: Rounds/Family Conference  Outcome: Progressing     Problem: Fall Injury Risk  Goal: Absence of Fall and Fall-Related Injury  Outcome: Progressing     Problem: Bowel Elimination Management  Goal: Effective Bowel Elimination/Continence  Outcome: Progressing     Problem: Bowel Elimination Management  Goal: Effective Bowel Elimination/Continence  Outcome: Progressing     Problem: Pain Acute  Goal: Acceptable Pain Control and Functional Ability  Outcome: Progressing     Problem: Pain Acute  Goal: Acceptable Pain Control and Functional Ability  Outcome: Progressing     Problem: Fall Injury Risk  Goal: Absence of Fall and Fall-Related Injury  Outcome: Progressing     Problem: Fall Injury Risk  Goal: Absence of Fall and Fall-Related Injury  Outcome: Progressing     Problem: Fall Injury Risk  Goal: Absence of Fall and Fall-Related Injury  Outcome: Progressing

## 2020-07-02 NOTE — Unmapped (Signed)
Patient vss, pain controlled with prn pain medication, no complaints of n/v. Patient encouraged to ambulate in the hallways throughout the shift.  No falls, adequate urine output, bed low locked, dvt prophalaxis continued. Lovenox teaching performed this morning. Patient performed a return demonstration to show his understanding of the information presented.  All medications given as ordered, will continue to monitor.   Problem: Impaired Wound Healing  Goal: Optimal Wound Healing  Outcome: Progressing     Problem: Adult Inpatient Plan of Care  Goal: Plan of Care Review  Outcome: Progressing  Goal: Patient-Specific Goal (Individualized)  Outcome: Progressing  Goal: Absence of Hospital-Acquired Illness or Injury  Outcome: Progressing  Goal: Optimal Comfort and Wellbeing  Outcome: Progressing  Goal: Readiness for Transition of Care  Outcome: Progressing  Goal: Rounds/Family Conference  Outcome: Progressing     Problem: Pain Acute  Goal: Acceptable Pain Control and Functional Ability  Outcome: Progressing     Problem: Bowel Elimination Management  Goal: Effective Bowel Elimination/Continence  Outcome: Progressing     Problem: Fall Injury Risk  Goal: Absence of Fall and Fall-Related Injury  Outcome: Progressing

## 2020-07-03 MED ORDER — ENOXAPARIN 40 MG/0.4 ML SUBCUTANEOUS SYRINGE
SUBCUTANEOUS | 0 refills | 27 days | Status: CP
Start: 2020-07-03 — End: 2020-07-30
  Filled 2020-07-02: qty 10.8, 27d supply, fill #0

## 2020-07-03 NOTE — Unmapped (Signed)
Discharge instructions provided, wound care instructions provided, ostomy supplies set up, scripts provided, follow up appointments scheduled, patient verbalizes understanding and has no further questions. Vital signs stable upon discharge.  Problem: Impaired Wound Healing  Goal: Optimal Wound Healing  07/02/2020 1706 by Josephine Igo, RN  Outcome: Resolved  07/02/2020 1153 by Josephine Igo, RN  Outcome: Progressing     Problem: Adult Inpatient Plan of Care  Goal: Plan of Care Review  07/02/2020 1706 by Josephine Igo, RN  Outcome: Resolved  07/02/2020 1153 by Josephine Igo, RN  Outcome: Progressing  Goal: Patient-Specific Goal (Individualized)  07/02/2020 1706 by Josephine Igo, RN  Outcome: Resolved  07/02/2020 1153 by Josephine Igo, RN  Outcome: Progressing  Goal: Absence of Hospital-Acquired Illness or Injury  07/02/2020 1706 by Josephine Igo, RN  Outcome: Resolved  07/02/2020 1153 by Josephine Igo, RN  Outcome: Progressing  Goal: Optimal Comfort and Wellbeing  07/02/2020 1706 by Josephine Igo, RN  Outcome: Resolved  07/02/2020 1153 by Josephine Igo, RN  Outcome: Progressing  Goal: Readiness for Transition of Care  07/02/2020 1706 by Josephine Igo, RN  Outcome: Resolved  07/02/2020 1153 by Josephine Igo, RN  Outcome: Progressing  Intervention: Mutually Develop Transition Plan  Recent Flowsheet Documentation  Taken 07/02/2020 1631 by Josephine Igo, RN  Transportation Anticipated: family or friend will provide  Goal: Rounds/Family Conference  07/02/2020 1706 by Josephine Igo, RN  Outcome: Resolved  07/02/2020 1153 by Josephine Igo, RN  Outcome: Progressing     Problem: Pain Acute  Goal: Acceptable Pain Control and Functional Ability  07/02/2020 1706 by Josephine Igo, RN  Outcome: Resolved  07/02/2020 1153 by Josephine Igo, RN  Outcome: Progressing     Problem: Bowel Elimination Management  Goal: Effective Bowel Elimination/Continence  07/02/2020 1706 by Josephine Igo, RN  Outcome: Resolved  07/02/2020 1153 by Josephine Igo, RN  Outcome: Progressing     Problem: Fall Injury Risk  Goal: Absence of Fall and Fall-Related Injury  07/02/2020 1706 by Josephine Igo, RN  Outcome: Resolved  07/02/2020 1153 by Josephine Igo, RN  Outcome: Progressing

## 2020-07-04 NOTE — Unmapped (Signed)
WOCN Consult Services   Telephone Call    Service: WOCN        Reason For Call:  - Ostomy supplies    Assessment:   Follow-up with patient; requested a preferred DME for ostomy supplies. PT decided on [employer].    Actions Taken:  -  Ostomy supplies ordered from [employer]   Request sent to GI Surg   Instructions Provided:  -  Call outpt ostomy as needed for ostomy needs     Follow Up:  -  Patient will contact outpatient WOCN for follow-up    Workup Time:   30 minutes     Dorris Fetch BSN RN [facility]  [facility] Consult Services   Pager [phone]

## 2020-07-04 NOTE — Unmapped (Signed)
Called patient with ostomy supply information.  Provided supplier name and account number.

## 2020-07-10 DIAGNOSIS — K5130 Ulcerative (chronic) rectosigmoiditis without complications: Principal | ICD-10-CM

## 2020-07-23 DIAGNOSIS — K51918 Ulcerative colitis, unspecified with other complication: Principal | ICD-10-CM

## 2020-07-24 ENCOUNTER — Encounter: Admit: 2020-07-24 | Discharge: 2020-07-24 | Payer: PRIVATE HEALTH INSURANCE

## 2020-07-24 ENCOUNTER — Institutional Professional Consult (permissible substitution): Admit: 2020-07-24 | Discharge: 2020-07-24 | Payer: PRIVATE HEALTH INSURANCE

## 2020-07-24 DIAGNOSIS — K5130 Ulcerative (chronic) rectosigmoiditis without complications: Principal | ICD-10-CM

## 2020-07-24 NOTE — Unmapped (Signed)
WOCN Consult Services  OSTOMY CLINIC VISIT NOTE     Reason for Consult:   - Follow-up  - Ileostomy    Problem List:   Active Problems:    * No active hospital problems. *    Assessment: .Pt requesting WOCN consult regarding peristomal skin irritation. Appliance removed with adhesive remover, cleansed with warm water and allowed to air dry. Pt has a lot of anxiety when trying to place barrier ring around stoma. Pt states that he has wasted most of his barrier rings trying to get them on correctly. We reviewed a different way of placing the barrier ring, directly to the wafer, and then applying the wafer and barrier together. Pt was relieved to find a solution to this problem.       Stoma Type:  -  Ileostomy Stoma Location:  - RLQ (Right Lower Quadrant)     Stoma Characteristics:  - Round  - Budded  - Diameter 29mm Stoma Mucosal Condition and Color:  - Moist  - Red     Mucocutaneous Junction:  - Intact    Rod/Stents:  - No     Output:  - Brown  - Applesauce consistency  - Effluent     Peristomal Skin Condition:   - Erythema    Abdominal Contours:  - Flat  - Soft    Pouching System:  - 1 Piece  - Convex  - CTF (Cut to fit)  - Moldable barrier ring  - Ostomy belt Anticipated Wear Time of Pouching System:  - 5 to 7 days     Teaching/Instructions:  - Crusting peristomal skin irritation.  - Measuring stoma size and cutting skin barrier appropriately.  - Application/adjustment of ostomy belt.  - Application of moldable barrier ring.  - Reviewed when to contact the outpatient WOC nurse with questions/concerns.    Recommendations/Plan:   - Trial current system     Plan of Care Discussed With:  - Patient  - Significant other    Ostomy Supplies:   - Ostomy pouching samples provided    Ostomy Product List:  TBD     WOCN Follow Up:  - Pt will follow-up after trial     Workup Time:  60 minutes     Dorris Fetch BSN RN [facility]  [facility] Consult Services   Pager [phone]

## 2020-07-24 NOTE — Unmapped (Signed)
COLORECTAL SURGERY RETURN CLINIC VISIT:       Chief Complaint: postoperative visit  CARE TEAM: Patient Care Team:  [facility] Svc as PCP - General  Dorris Fetch, RN as WOCN  (Wound Care)     Attending Surgeon A/P     As noted, doing well.  Looks and feels strong. Tolerating a regular diet. Stoma functioning well. Scant discharge per anus.  Will see WOCN. RTC in 2 months and explore timing of IPAA/DLI in September. Questions answered.       I saw and evaluated the patient, participating in the key portions of the service.?? I reviewed the resident???s note.?? I agree with the resident???s findings and plan. Laverda Page, MD        Subjective:      HISTORY OF PRESENT ILLNESS:  Kevin Tran is a 60 y.o. male who returns for postoperative visit after Robotic, laparoscopic assisted total abdominal colectomy with end Ileostomy on 06/29/20. He was discharged on POD3.     He has been recovering well at home.  He reports he initially had significant pain utilizing his narcotics but quickly ran out.  He did not contact our clinic to receive more.  In the meantime he has been taking Tylenol and believes this is now controlling his pain.  He has been managing his ostomy emptying at around 4 times a day and reports the output is liquid to applesauce in consistency.  He reports that his foul-smelling and he has been putting out a lot of gas from it.  He uses a ostomy belt during the day and this helps keep it back on.  He has had some issues maintaining a seal around the ostomy itself with some peristomal irritation.  He has not been using any powders or ointments in this area.  He reports some incisional pain from his Pfannenstiel incision but no issues with erythema or drainage.  No fevers or chills.  He believes he has been eating well but has not gained any weight after surgery.  He has been hydrating with water and Gatorade. Minimal drainage from his rectum.  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  He is inquiring about disability.      History:  Past Medical History:   Diagnosis Date   ??? Arthritis    ??? Pneumothorax    ??? Ulcerative colitis (CMS-HCC)      Past Surgical History:   Procedure Laterality Date   ??? BACK SURGERY  1996   ??? COLONOSCOPY     ??? PR COLONOSCOPY W/BIOPSY SINGLE/MULTIPLE Left 07/01/2018    Procedure: COLONOSCOPY, FLEXIBLE, PROXIMAL TO SPLENIC FLEXURE; WITH BIOPSY, SINGLE OR MULTIPLE;  Surgeon: [name], MD;  Location: [facility] PROCEDURES [facility];  Service: Gastroenterology   ??? PR COLONOSCOPY W/BIOPSY SINGLE/MULTIPLE  10/20/2019    Procedure: COLONOSCOPY, FLEXIBLE, PROXIMAL TO SPLENIC FLEXURE; WITH BIOPSY, SINGLE OR MULTIPLE;  Surgeon: Luanne Bras, MD;  Location: [facility] PROCEDURES [facility]  Service: Gastroenterology   ??? PR LAP, SURG ENTEROLYSIS Midline 06/29/2020    Procedure: ROBOTIC XI LAPAROSCOPY, SURGICAL, ENTEROLYSIS (FREEING OF INTESTINAL ADHESION) (SEPARATE PROCEDURE);  Surgeon: [name], MD;  Location: MAIN OR [facility];  Service: Gastrointestinal   ??? PR LAP,SURG,COLECTOMY,TOTAL,W/O PROCTECTOMY N/A 06/29/2020    Procedure: ROBOTIC XI LAPAROSCOPY, SURGICAL; COLECTOMY, TOTAL, ABDOMINAL, WITHOUT PROCTECTOMY, WITH ILEOSTOMY OR ILEOPROCTOSTOMY;  Surgeon: [name], MD;  Location: MAIN OR [facility];  Service: Gastrointestinal   ??? PR SIGMOIDOSCOPY,BIOPSY N/A 03/28/2020    Procedure: SIGMOIDOSCOPY, FLEXIBLE; WITH BIOPSY, SINGLE OR MULTIPLE;  Surgeon: Luanne Bras, MD;  Location: [facility] OR [facility];  Service: Gastroenterology       Social and Family History were reviewed and are unchanged since his last visit.    REVIEW OF SYSTEMS:  Ten system review is negative except as noted in the HPI RLA.     Objective:        PHYSICAL EXAMINATION:  Vitals:    07/24/20 0953   BP: 110/71   Pulse: 71   Temp: 36.5 ??C     BMI Readings from Last 5 Encounters:   07/24/20 23.70 kg/m?? 07/02/20 23.14 kg/m??   06/19/20 23.97 kg/m??   06/13/20 23.78 kg/m??   05/18/20 24.48 kg/m??     GEN: thin appearing male in NAD  ABD: soft, NT/ND, end ileostomy pink and patent with dark brown thin output in bag, Pfannenstiel incision well-healed with sutures removed and Steri-Strips placed.  Other  trocar sites well-healing with Dermabond  EXTREMITIES: No edema  RECTAL: deferred  LYMPHATICS: No palpable inguinal adenopathy    LABS:  No results found for: CEA   IMAGING:  None      Assessment:        CAIN FITZHENRY is 60 y.o. male who is 3 weeks status post total abdominal colectomy with end ileostomy as first stage procedure - IPAA.  He has some peristomal irritation but is otherwise doing well.  His weight remains stable. Sutures were removed and his incisions are healing well.  We discussed next stage of his operation occuring around 3 months postoperatively with J-pouch creation and diverting ileostomy.        Plan:      -RTC 3 months postoperatively around mid-August   -WOCN to see today for peristomal irritation and continued ostomy teaching   -Reinforced adequate nutrition and hydration with end ileostomy

## 2020-08-04 NOTE — Unmapped (Signed)
WOCN Consult Services   Telephone Call    Service: WOCN        Reason For Call:  - Ostomy supplies    Assessment:   Pt requested a change in ostomy supplies to the [facility] [id number]    Actions Taken:  -  Ostomy supplies ordered from [employer] Provided:  -  Pt new supplies will be sent 09/2020. Pt may call [employer] to exchange supplies     Follow Up:  -  Patient will contact outpatient WOCN for follow-up    Workup Time:   30 minutes     Dorris Fetch BSN RN [facility]  [facility] Consult Services   Pager [phone]

## 2020-08-04 NOTE — Unmapped (Signed)
Placed order for M9 and Devrom odor eliminator with [employer] on behalf of patient, at patient request.

## 2020-08-07 DIAGNOSIS — K5130 Ulcerative (chronic) rectosigmoiditis without complications: Principal | ICD-10-CM

## 2020-08-15 MED ORDER — SULFASALAZINE 500 MG TABLET
ORAL_TABLET | Freq: Four times a day (QID) | ORAL | 5 refills | 30.00000 days | Status: CP
Start: 2020-08-15 — End: 2021-08-15

## 2020-08-15 NOTE — Unmapped (Signed)
Patient called with complaints of progressively worsening joint pain over the last 5weeks since stopping his sulfasalazine pre-surgery. Discussed with Dr. Raphael Gibney, will plan to resume now at previous dose of 1000mg  QID.  Script sent to [facility].  Patient will update if pain is not improving over the next 2weeks. Ok to use acetaminophen in the interim if needed. Patient verbalized understanding and in agreement with plan.

## 2020-08-21 DIAGNOSIS — K5130 Ulcerative (chronic) rectosigmoiditis without complications: Principal | ICD-10-CM

## 2020-08-21 NOTE — Unmapped (Signed)
Reason For Call:  Spoke to patient today      Assessment:   Patient states he has arthritis in his hips, knees, hands, shoulder, similar to his previous pain  Patient started sulfasalzine 6 days ago. 2 tablets QID and it has been about  6 weeks since colectomy sx.     Follow Up:  It has only been 6 days since re starting sulfsasalazine which was working well for his joint pain previously, but he was also taking infliximab.    Will discuss with Dr Raphael Gibney       Workup Time:   10 min

## 2020-08-23 MED ORDER — ONDANSETRON HCL 4 MG TABLET
ORAL_TABLET | Freq: Three times a day (TID) | ORAL | 2 refills | 7.00000 days | Status: CP | PRN
Start: 2020-08-23 — End: 2020-08-30

## 2020-08-23 MED ORDER — FOLIC ACID 1 MG TABLET
ORAL_TABLET | Freq: Every day | ORAL | 11 refills | 30 days | Status: CP
Start: 2020-08-23 — End: 2021-08-23

## 2020-08-23 MED ORDER — METHOTREXATE SODIUM 2.5 MG TABLET
ORAL_TABLET | ORAL | 0 refills | 28 days | Status: CP
Start: 2020-08-23 — End: 2020-09-22

## 2020-09-04 DIAGNOSIS — K5130 Ulcerative (chronic) rectosigmoiditis without complications: Principal | ICD-10-CM

## 2020-09-04 DIAGNOSIS — Z79899 Other long term (current) drug therapy: Principal | ICD-10-CM

## 2020-09-04 DIAGNOSIS — K51919 Ulcerative colitis, unspecified with unspecified complications: Principal | ICD-10-CM

## 2020-09-13 NOTE — Unmapped (Signed)
Left voice message for patient r/t questions she had regarding lab work.

## 2020-09-16 LAB — CBC
HEMATOCRIT: 42.4 % (ref 37.5–51.0)
HEMOGLOBIN: 13.6 g/dL (ref 13.0–17.7)
MEAN CORPUSCULAR HEMOGLOBIN CONC: 32.1 g/dL (ref 31.5–35.7)
MEAN CORPUSCULAR HEMOGLOBIN: 26.1 pg — ABNORMAL LOW (ref 26.6–33.0)
MEAN CORPUSCULAR VOLUME: 81 fL (ref 79–97)
PLATELET COUNT: 263 10*3/uL (ref 150–450)
RED BLOOD CELL COUNT: 5.21 x10E6/uL (ref 4.14–5.80)
RED CELL DISTRIBUTION WIDTH: 12.8 % (ref 11.6–15.4)
WHITE BLOOD CELL COUNT: 5.6 10*3/uL (ref 3.4–10.8)

## 2020-09-16 LAB — ALT: ALT (SGPT): 23 IU/L (ref 0–44)

## 2020-09-17 DIAGNOSIS — K51918 Ulcerative colitis, unspecified with other complication: Principal | ICD-10-CM

## 2020-09-18 DIAGNOSIS — K5130 Ulcerative (chronic) rectosigmoiditis without complications: Principal | ICD-10-CM

## 2020-09-18 DIAGNOSIS — K51919 Ulcerative colitis, unspecified with unspecified complications: Principal | ICD-10-CM

## 2020-09-18 DIAGNOSIS — Z79899 Other long term (current) drug therapy: Principal | ICD-10-CM

## 2020-09-21 MED ORDER — PREDNISONE 10 MG TABLET
ORAL_TABLET | ORAL | 0 refills | 35 days | Status: CP
Start: 2020-09-21 — End: 2020-10-26

## 2020-09-21 MED ORDER — METHOTREXATE SODIUM 2.5 MG TABLET
ORAL_TABLET | ORAL | 0 refills | 28.00000 days | Status: CP
Start: 2020-09-21 — End: 2020-10-21

## 2020-09-21 NOTE — Unmapped (Signed)
Spoke to pt today. He states the methotrexate 15mg  weekly is not helping his joint pain. He is unable to button his shirt his hands are so painful. He feels prednisone is the only medication that helps.    Per Dr Raphael Gibney  Increase methotrexate to 25mg  PO weekly, start prednisone 20mg  x 2 weeks, decrease by 5mg  every 7 days.   Speak to local PCP regarding local rheumatology referral since he is not scheduled to see rhum at [facility] until feb.     Pt agrees to above plan

## 2020-09-25 ENCOUNTER — Encounter: Admit: 2020-09-25 | Discharge: 2020-09-25 | Payer: PRIVATE HEALTH INSURANCE

## 2020-09-25 ENCOUNTER — Ambulatory Visit: Admit: 2020-09-25 | Discharge: 2020-09-25 | Payer: PRIVATE HEALTH INSURANCE

## 2020-09-25 NOTE — Unmapped (Signed)
COLORECTAL SURGERY RETURN CLINIC VISIT:     Chief Complaint: postoperative visit  CARE TEAM: Patient Care Team:  [facility] Svc as PCP - General  Dorris Fetch, RN as WOCN  (Wound Care)       Attending Surgeon A/P     As noted, looking and feeling quite well. Has gained @ 6 lbs since last visit and energy level is high. On prednisone taper/methotrexate for arthritis.   Exam is benign. DRE is intact. Good sphincter tone. No BRBPR.    Patient will RTC after steroid taper and review surgical plans for Robotic Proctectomy and IPAA with DLI creation. Questions answered.   I saw and evaluated the patient, participating in the key portions of the service.?? I reviewed the resident???s note.?? I agree with the resident???s findings and plan. Laverda Page, MD      Subjective:      HISTORY OF PRESENT ILLNESS:  Kevin Tran is a 60 y.o. male with ulcerative colitis who returns for postoperative visit after robotic, laparoscopic assisted total abdominal colectomy with end Ileostomy on 06/29/20. Patient reports overall he is doing well. He reports that his appetite has increased and that he is gaining weight. He has been drinking water as much as I can. He has been managing his ostomy emptying well since his last consultation with WOCN. He endorses persistent joint pain which is currently being treated with a prednisone taper and methotrexate. He denies any nausea, vomiting, fever or abdominal pains. Patient reports no changes in his health since prior clinic visit. There are no concerns at this time and patient is eager to discuss the next steps.     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 Arthritis    ??? Pneumothorax    ??? Ulcerative colitis (CMS-HCC)      Past Surgical History:   Procedure Laterality Date   ??? BACK SURGERY  1996   ??? COLONOSCOPY     ??? PR COLONOSCOPY W/BIOPSY SINGLE/MULTIPLE Left 07/01/2018    Procedure: COLONOSCOPY, FLEXIBLE, PROXIMAL TO SPLENIC FLEXURE; WITH BIOPSY, SINGLE OR MULTIPLE;  Surgeon: [name], MD;  Location: [facility] PROCEDURES [facility];  Service: Gastroenterology   ??? PR COLONOSCOPY W/BIOPSY SINGLE/MULTIPLE  10/20/2019    Procedure: COLONOSCOPY, FLEXIBLE, PROXIMAL TO SPLENIC FLEXURE; WITH BIOPSY, SINGLE OR MULTIPLE;  Surgeon: Luanne Bras, MD;  Location: [facility] PROCEDURES [facility];  Service: Gastroenterology   ??? PR LAP, SURG ENTEROLYSIS Midline 06/29/2020    Procedure: ROBOTIC XI LAPAROSCOPY, SURGICAL, ENTEROLYSIS (FREEING OF INTESTINAL ADHESION) (SEPARATE PROCEDURE);  Surgeon: [name], MD;  Location: MAIN OR [facility];  Service: Gastrointestinal   ??? PR LAP,SURG,COLECTOMY,TOTAL,W/O PROCTECTOMY N/A 06/29/2020    Procedure: ROBOTIC XI LAPAROSCOPY, SURGICAL; COLECTOMY, TOTAL, ABDOMINAL, WITHOUT PROCTECTOMY, WITH ILEOSTOMY OR ILEOPROCTOSTOMY;  Surgeon: [name], MD;  Location: MAIN OR [facility];  Service: Gastrointestinal   ??? PR SIGMOIDOSCOPY,BIOPSY N/A 03/28/2020    Procedure: SIGMOIDOSCOPY, FLEXIBLE; WITH BIOPSY, SINGLE OR MULTIPLE;  Surgeon: Luanne Bras, MD;  Location: [facility];  Service: Gastroenterology       Social and Family History were reviewed and are unchanged since his last visit.    REVIEW OF SYSTEMS:  Ten system review is negative except as noted in the HPI RLA.     Objective:        PHYSICAL EXAMINATION:  Vitals:    09/25/20 1053   BP: 127/77   Pulse: 68   Temp: 36.2 ??C (97.1 ??F)     BMI Readings from Last 5 Encounters: 09/25/20 24.64 kg/m??   07/24/20 23.70 kg/m??   07/02/20 23.14 kg/m??   06/19/20 23.97 kg/m??   06/13/20 23.78 kg/m??     GEN: thin appearing male in NAD  HEENT: Mucous membranes moist and intact  ABD: soft, NT/ND, end ileostomy pink and patent with dark brown apple-sauce consistency output in bag, Pfannenstiel incision well-healed. Trocar sites well-healed.  EXTREMITIES: No edema  RECTAL: Good external sphincter tone. No blood appreciated. No nodules or masses internally appreciated. No external lesions appreciated.     LABS: None  IMAGING:  None      Assessment:      JONTEZ REDFIELD is 60 y.o. male with history of ulcerative colitis who is 3 months status post total abdominal colectomy presenting for evaluation for IPAA/DLI.  Patient is doing well and in good health. He has gained 10 lbs since his surgery with increased PO intake. Surgical incisions are well healed. Ostomy is pink and patent with dark brown output in bag today. We discussed the next stages of his operation occurring one month after he finished his prednisone taper. Given appropriate rectal tone, patient would be a good candidate for IPAA/DLI in October/November.     Plan:      -RTC 1 month after finishing prednisone taper, with plan for surgery 1 month after clinic visit  -Reinforced adequate nutrition and hydration with end ileostomy       Virgie Dad, MD MPH  General Surgery PGY1  Pager # [phone]

## 2020-10-02 DIAGNOSIS — K5130 Ulcerative (chronic) rectosigmoiditis without complications: Principal | ICD-10-CM

## 2020-10-02 DIAGNOSIS — K51919 Ulcerative colitis, unspecified with unspecified complications: Principal | ICD-10-CM

## 2020-10-02 DIAGNOSIS — Z79899 Other long term (current) drug therapy: Principal | ICD-10-CM

## 2020-10-07 LAB — ALT: ALT (SGPT): 32 IU/L (ref 0–44)

## 2020-10-07 LAB — CBC W/ DIFFERENTIAL
BANDED NEUTROPHILS ABSOLUTE COUNT: 0.1 10*3/uL (ref 0.0–0.1)
BASOPHILS ABSOLUTE COUNT: 0 10*3/uL (ref 0.0–0.2)
BASOPHILS RELATIVE PERCENT: 0 %
EOSINOPHILS ABSOLUTE COUNT: 0 10*3/uL (ref 0.0–0.4)
EOSINOPHILS RELATIVE PERCENT: 0 %
HEMATOCRIT: 43.3 % (ref 37.5–51.0)
HEMOGLOBIN: 14.2 g/dL (ref 13.0–17.7)
IMMATURE GRANULOCYTES: 1 %
LYMPHOCYTES ABSOLUTE COUNT: 1 10*3/uL (ref 0.7–3.1)
LYMPHOCYTES RELATIVE PERCENT: 12 %
MEAN CORPUSCULAR HEMOGLOBIN CONC: 32.8 g/dL (ref 31.5–35.7)
MEAN CORPUSCULAR HEMOGLOBIN: 26.9 pg (ref 26.6–33.0)
MEAN CORPUSCULAR VOLUME: 82 fL (ref 79–97)
MONOCYTES ABSOLUTE COUNT: 0.3 10*3/uL (ref 0.1–0.9)
MONOCYTES RELATIVE PERCENT: 4 %
NEUTROPHILS ABSOLUTE COUNT: 6.7 10*3/uL (ref 1.4–7.0)
NEUTROPHILS RELATIVE PERCENT: 83 %
PLATELET COUNT: 330 10*3/uL (ref 150–450)
RED BLOOD CELL COUNT: 5.28 x10E6/uL (ref 4.14–5.80)
RED CELL DISTRIBUTION WIDTH: 14.5 % (ref 11.6–15.4)
WHITE BLOOD CELL COUNT: 8 10*3/uL (ref 3.4–10.8)

## 2020-10-07 LAB — AST: AST (SGOT): 18 IU/L (ref 0–40)

## 2020-10-16 DIAGNOSIS — K51919 Ulcerative colitis, unspecified with unspecified complications: Principal | ICD-10-CM

## 2020-10-16 DIAGNOSIS — Z79899 Other long term (current) drug therapy: Principal | ICD-10-CM

## 2020-10-16 DIAGNOSIS — K5130 Ulcerative (chronic) rectosigmoiditis without complications: Principal | ICD-10-CM

## 2020-10-17 MED ORDER — METHOTREXATE SODIUM 2.5 MG TABLET
ORAL_TABLET | 0 refills | 0.00000 days | Status: CP
Start: 2020-10-17 — End: ?

## 2020-10-17 NOTE — Unmapped (Signed)
Labs reviewed from 10/06/20, methotrexate refill sent per protocol

## 2020-10-20 NOTE — Unmapped (Addendum)
[facility]   FOLLOW UP NOTE - INFLAMMATORY BOWEL DISEASE  10/24/2020    Demographics:  BARACK NICODEMUS is a 60 y.o. year old male    Diagnosis:  Ulcerative Colitis  Disease onset (yr):  2018  Age at onset:  > 45yr old (A3)  Location:  Left-sided (E2)  Behavior:  Colitis  Current Tight Control Scenario:   Maintenance = Biologic/small molecule          HPI / NOTE :     VIDEO VISIT DUE TO COVID19    Interval Events:   1.  Last seen May 2022.  Since then he has had a colectomy and end ileostomy with Dr. Miachel Roux on May 19.  I reviewed operative notes and pathology results.  2.  He is continuing to have considerable issues with joint pains.  We have tried methotrexate 25 mg weekly plus a prednisone taper to try to help with the joint pains.  3.  Reviewed notes from Dr. Miachel Roux 09/25/2020, he would like to proceed with IPAA approxi-1 month after completing prednisone.  4.  I reviewed labs from 10/06/2020 CBC, AST, ALT all unremarkable    HPI:   Still has joint pain - burning in hands, wrists, shoulders - all day long, no pattern. Also has pain in the joints in addition to the burning.  Has been on methotrexate 25mg  x 5-6 weeks, hard to know if it is working. Still on prednisone 5mg  daily.     He saw a rheumatologist locally Lavenia Atlas). They are doing some work up, had labs and x-rays.  They are planning a ?nerve conduction study.  They recommended that he stay on prednisone 5mg  a while longer until there is a clear diagnosis.     Abdominal pain (0-10): occasional, minimal  BM a day: empties ostomy bag 5-6x/day, half full each time  Consistency: loose  % of stools have blood: 0%  Nocturnal BM: NA ostomy  Urgency: NA, ostomy  Weight change over last 6 mo:  Stable ~155-160lb (not gaining but not losing)  Smoking: no  NSAIDS: avoids    Review of Systems:   Review of systems positive for: fatigue  Otherwise, the balance of 10 systems is negative           IBD HISTORY:     Year of disease onset:  2018    Brief IBD Disease Course:  In late 2018 developed bloody diarrhea and incontinence. Colonoscopy in Feb 2019 showed edema and erythema in the rectum and sigmoid with decreased vascularity in the rest of the colon; the TI appeared normal. He was treated with courses of prednisone for much of 2019 and also put on mesalamine without improvement. In Fall 2019 he was started on Humira w/o improvement. In Dec 2019 he had moderate level antibodies to humira; was added; mesalamine was stopped; put on prednisone taper; rapidly improved  - Jan 2020 - stopped mesalamine, used weekly Humira + with initial improvement.   - April 2020 - notable worsening of diarrhea and abdominal pain  Colonoscopy with moderate inflammation mainly in rectum.  Changed to [name] 06/2018.  Had limited response initially, but improved with Uceris foam 09/2018.    - 09/2018 - taper to xeljanz 5mg  BID.  Nov 2020 - ongoing proctitis symptoms, restarted Uceris foam.   - 06/2019 - severe UC flare on [name], plan to restage disease and change therapy  - 10/2019 - moderate proctitis (up to ~20cm), started canasa +  Stelara.  Good symptom response to Stelara initially, but had severe arthralgias.   - Feb 2022 - worsening UC flare despite Stelara.  Admitted to [facility], discharged on prednisone taper.  Recurrent symptoms, readmitted 1 week later, started on IV Remicade 5mg /kg in the hospital on 04/06/2020. Low trough level April 2022 - increased dose to 10mg /kg.    - May 2022 - poor response to high dose Remicade.  Colectomy 06/29/2020 ([facility]). Surg path - severe chronic active colitis c/w UC.     Endoscopy:      - Colonoscopy Feb 2019: edema + erythema in rectum and sigmoid; decreased vascularity in rest of colon  - Dec 2019: moderate inflammation throughout the colon  - Colonoscopy 07/01/2018 - poor prep, moderate proctitis up to 15cm, mild patchy nodularity in remaining colon.  Normal terminal ileum.   - Colonoscopy 10/20/2019 - hemorrhoids.  Moderate (Mayo 2) colitis from anal verge up to 10cm.  Mild colitis from 10-20cm, then normal colon.  Normal terminal ileum.    PATH:  Moderately active chronic colitis  - Sigmoidoscopy 03/28/2020 - moderate (Mayo 2) colitis through the extent of the exam (up to the descending colon at least).    PATH:  Severe chronic active colitis with no CMV    Imaging:    - CT 01/28/18: left sided colitis; liver lesion (see below)  - MRI abdomen to evaluate liver lesion 02/09/18 - aforementioned liver lesion felt to be focal fatty infiltration;  6 month repeat recommended    Prior IBD medications (type, dose, duration, response):  [x]  5-ASAs - Mesalamine  - no improvement; possible paradoxical diarrhea  [x]  Oral corticosteroids - prednisone.  Good response with budesonide foam.   []  Intravenous corticosteroids  []  Antibiotics  [x]  Thiopurines: started in Dec 2019  []  Methotrexate  [x]  Anti-TNF therapies - Humira since Sept or Oct 2019. Low level and low titer Ab in Dec 2019. Increased to weekly dosing. Stopped spring 2020 due to non-response despite good drug levels.   []  Anti-Integrin therapies  []  Anti-Interleukin therapies  [x]  Anti-JAK therapies - Xeljanz 10mg  started 06/2018, tapered to 5mg  bid 09/2018  []  Cyclosporine  []  Clinical trial medication  []  Other (Please specify):    Extraintestinal manifestations:   -joint pains affecting: y, peripheral arthralgias  -eye: n  -skin: n  -oral ulcers :  n  -blood clots: n  -PSC: n  -other: n          Past Medical History:   Past medical history:   Past Medical History:   Diagnosis Date   ??? Arthritis    ??? Pneumothorax    ??? Ulcerative colitis (CMS-HCC)      Past surgical history:   Past Surgical History:   Procedure Laterality Date   ??? BACK SURGERY  1996   ??? COLONOSCOPY     ??? PR COLONOSCOPY W/BIOPSY SINGLE/MULTIPLE Left 07/01/2018    Procedure: COLONOSCOPY, FLEXIBLE, PROXIMAL TO SPLENIC FLEXURE; WITH BIOPSY, SINGLE OR MULTIPLE;  Surgeon: [name], MD;  Location: [facility];  Service: Gastroenterology   ??? PR COLONOSCOPY W/BIOPSY SINGLE/MULTIPLE  10/20/2019    Procedure: COLONOSCOPY, FLEXIBLE, PROXIMAL TO SPLENIC FLEXURE; WITH BIOPSY, SINGLE OR MULTIPLE;  Surgeon: Luanne Bras, MD;  Location: [facility] PROCEDURES [facility];  Service: Gastroenterology   ??? PR LAP, SURG ENTEROLYSIS Midline 06/29/2020    Procedure: ROBOTIC XI LAPAROSCOPY, SURGICAL, ENTEROLYSIS (FREEING OF INTESTINAL ADHESION) (SEPARATE PROCEDURE);  Surgeon: [name], MD;  Location: MAIN  OR [place];  Service: Gastrointestinal   ??? PR LAP,SURG,COLECTOMY,TOTAL,W/O PROCTECTOMY N/A 06/29/2020    Procedure: ROBOTIC XI LAPAROSCOPY, SURGICAL; COLECTOMY, TOTAL, ABDOMINAL, WITHOUT PROCTECTOMY, WITH ILEOSTOMY OR ILEOPROCTOSTOMY;  Surgeon: [name], MD;  Location: MAIN OR [facility];  Service: Gastrointestinal   ??? PR SIGMOIDOSCOPY,BIOPSY N/A 03/28/2020    Procedure: SIGMOIDOSCOPY, FLEXIBLE; WITH BIOPSY, SINGLE OR MULTIPLE;  Surgeon: Luanne Bras, MD;  Location[facility] OR [facility];  Service: Gastroenterology     Family history:   Family History   Problem Relation Age of Onset   ??? Cancer Mother      Social history:   Social History     Socioeconomic History   ??? Marital status: Married   Tobacco Use   ??? Smoking status: Former Smoker     Types: Cigarettes     Quit date: 2006     Years since quitting: 16.7   ??? Smokeless tobacco: Never Used   Vaping Use   ??? Vaping Use: Never used   Substance and Sexual Activity   ??? Alcohol use: Not Currently   ??? Drug use: No   ??? Sexual activity: Yes     Partners: Female             Allergies:   No Known Allergies          Medications:     Current Outpatient Medications   Medication Sig Dispense Refill   ??? acetaminophen (TYLENOL) 500 MG tablet Take 1 tablet (500 mg total) by mouth every six (6) hours as needed for pain. 40 tablet 0   ??? folic acid (FOLVITE) 1 MG tablet Take 1 tablet (1 mg total) by mouth in the morning. 30 tablet 11   ??? melatonin 3 mg Tab Take 1 tablet (3 mg total) by mouth nightly as needed.  0   ??? methotrexate 2.5 MG tablet TAKE 10 TABLETS BY MOUTH ONCE A WEEK 120 tablet 0   ??? multivitamin (TAB-A-VITE/THERAGRAN) per tablet Take 1 tablet by mouth daily.     ??? omeprazole (PRILOSEC OTC) 20 MG tablet Take 20 mg by mouth daily.     ??? predniSONE (DELTASONE) 10 MG tablet Take 2 tablets (20 mg total) by mouth daily for 14 days, THEN 1.5 tablets (15 mg total) daily for 7 days, THEN 1 tablet (10 mg total) daily for 7 days, THEN 0.5 tablets (5 mg total) daily for 7 days. 49 tablet 0   ??? pregabalin (LYRICA) 100 MG capsule Take 100 mg by mouth Two (2) times a day.       No current facility-administered medications for this visit.             Physical Exam:   There were no vitals taken for this visit.    VIDEO VISIT - no vitals and limited physical exam    GEN: middle aged male in no apparent distress, appears comfortable on exam  HEENT: eyes normal and symmetric  NEURO:  Normal speech, face symmetric  PULM:  Respirations comfortable  SKIN:  No visible rash on face/neck  Psych: affect appropriate, A&O x3          Labs, Data & Indices:     Lab Review:   Lab Results   Component Value Date    WBC 8.0 10/06/2020    RBC 5.28 10/06/2020    HGB 14.2 10/06/2020     Lab Results   Component Value Date    AST 18 10/06/2020    ALT 32 10/06/2020  BUN 10 06/30/2020    BUN 14 08/04/2019    Creatinine 0.87 06/30/2020    Creatinine 0.99 08/04/2019    CO2 27.0 06/30/2020    CO2 24 08/04/2019    Albumin 3.3 (L) 06/19/2020    Calcium 8.6 (L) 06/30/2020    Calcium 10.0 08/04/2019     No results found for: TSH   ...................................................................................................  ............................................................................................................................................  ORDERS THIS VISIT:       Diagnosis ICD-10-CM Associated Orders   1. 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 Arthralgia, unspecified joint  M25.50    3. Medication management  Z79.899    4. Ileostomy present (CMS-HCC)  Z93.2            Assessment & Recommendations:   Disease state:    BLU LORI is a 60 y.o. male with ulcerative pancolitis (mostly proctosigmoiditis) since 2018. He was started on Humira in 2019. By April 2020, he had a mechanistic non-response to Humira (good drug level but moderate to severe proctitis).  Since then he has had suboptimal response to [place] and Ustekinumab. He had a severe UC flare in Feb 2022 and was admitted to [facility]. We started on Infliximab 04/06/2020 but he had worsening colitis despite ~10 weeks of Infliximab therapy and dose escalation.  Hence, we proceeded with surgery (06/29/20) and he is now status post colectomy with an end ileostomy and future plans for an IPAA.      Unfortunately, he has continued have significant issues with joint pains, likely an extraintestinal manifestation of his UC.  We are trying methotrexate and prednisone taper with some improvement. He has seen a local rheumatologist and they will be doing a nerve conduction study. I do think that having his next surgery with a proctectomy could improve his symptoms (the remaining rectal stump is likely still fairly inflamed and could be driving extraintestinal arthralgias).  I discussed this with him and suggested getting off the prednisone as soon as his rheumatologist agrees and then scheduling his next surgery with Dr. Miachel Roux.     PLAN:  1.  Continue Methotrexate 25mg  weekly for now with folic acid  2.  Continue lab monitoring on methotrexate - repeat labs on 11/06/20, then in 2 months  3.  Continue work up with your rheumatologist.  I would suggest trying to get off the prednisone as soon as possible.   4.   Once you know when you will be off the prednisone, please call the surgery clinic to schedule your next surgery.     * I personally spoke with the patient's Rheumatologist, Dr. Gavin Potters, today about the plan of care and joint recommendations regarding his joint pain and colitis.     IBD health maintenance:  Influenza vaccine: given in Fall 2019  Pneumonia vaccine:   Hepatitis B: received twinrix 2 of 3;   TB testing: Quant gold neg in March 2019  Chickenpox/Shingles history: Shingrix completed 09/2018  Bone denistometry:   Derm appointment:  Last colonoscopy: 10/2019  --------------------------------------------------------  Author: [name] 10/24/2020 12:54 PM    [name], MD  Assistant Professor of Medicine  Division of Gastroenterology & Hepatology  [place] [facility] [place] [facility] [place]  ====================================================      The patient reports they are currently: at home. I spent 24 minutes on the real-time audio and video visit with the patient on the date of service. I spent an additional 11 minutes on pre- and post-visit activities on the date of service.  The patient was not located and I was located within 250 yards of a hospital based location during the real-time audio and video visit. The patient was physically located in [place] or a state in which I am permitted to provide care. The patient and/or parent/guardian understood that s/he may incur co-pays and cost sharing, and agreed to the telemedicine visit. The visit was reasonable and appropriate under the circumstances given the patient's presentation at the time.    The patient and/or parent/guardian has been advised of the potential risks and limitations of this mode of treatment (including, but not limited to, the absence of in-person examination) and has agreed to be treated using telemedicine. The patient's/patient's family's questions regarding telemedicine have been answered.    If the visit was completed in an ambulatory setting, the patient and/or parent/guardian has also been advised to contact their provider???s office for worsening conditions, and seek emergency medical treatment and/or call 911 if the patient deems either necessary.

## 2020-10-24 ENCOUNTER — Telehealth: Admit: 2020-10-24 | Discharge: 2020-10-25 | Payer: PRIVATE HEALTH INSURANCE

## 2020-10-24 DIAGNOSIS — Z79899 Other long term (current) drug therapy: Principal | ICD-10-CM

## 2020-10-24 DIAGNOSIS — M2550 Pain in unspecified joint: Principal | ICD-10-CM

## 2020-10-24 DIAGNOSIS — Z932 Ileostomy status: Principal | ICD-10-CM

## 2020-10-24 DIAGNOSIS — K5130 Ulcerative (chronic) rectosigmoiditis without complications: Principal | ICD-10-CM

## 2020-10-24 NOTE — Unmapped (Signed)
Kevin Tran it was a pleasure seeing you today.  Here is a summary/wrap up from today's visit:     1.  Continue Methotrexate 25mg  weekly for now with folic acid  2.  Continue lab monitoring on methotrexate - repeat labs on 11/06/20, then in 2 months  3.  Continue work up with your rheumatologist.  I would suggest trying to get off the prednisone as soon as possible.   4.   Once you know when you will be off the prednisone, please call the surgery clinic to schedule your next surgery.     [name], MD  Assistant Professor of Medicine  Division of Gastroenterology & Hepatology  [place] [facility] [place] [facility]                EXPECTATIONS FOR PATIENT FOLLOW UP AND COMMUNICATION:  -- Follow up Appointments:  Crohn's disease and Ulcerative colitis are serious chronic inflammatory diseases which require close monitoring.  I typically expect to see patients for a follow up visits at least every 6 months (or more often if you are having a flare, starting new therapy, etc).  In select cases, patients who are only on aminosalicylates / mesalamine, may be seen once per year for follow up.       -- Appointment Type: While we are continuing to offer video appointments in select cases (stable patients who are not in a flare and without new/active symptoms) during the COVID19 pandemic, I expect to see patients in person at least once per year.     -- Communication:  if you have any questions or concerns, you can communicate with [name] via phone (contact information below) or via myChart.  Note that phone messages are given higher priority and triaged first.  We typically respond to myChart messages within 3 business days (occasionally longer during holidays or vacation times).  For urgent issues, please contact us via phone.      IBD NURSE COORDINATOR CONTACT - Neta Mends, RN BSN     Phone: [phone] (direct line)      Fax: [phone]  * For urgent medical concerns after hours or on weekends and holidays, call [phone] and ask to speak to the GI Fellow on call.    * If you have a GI medical question or GI symptoms and would like to speak to your provider's healthcare team, please contact Neta Mends, RN (contact information above) OR you can send the GI healthcare team a message through MyChart at [email or web address]    APPOINTMENT SCHEDULING FOR GI CLINIC AND GI PROCEDURES:  RADIOLOGY - to schedule imaging ordered, please call [phone] opt 1   GI MEDICINE CLINIC  [phone] option 1   GI PROCEDURES         [phone] option 2   *To schedule, reschedule, or cancel your GI appointment, please call [phone]. If you are unable to come to an appointment, please notify us as soon as possible, preferably 24 hours in advance. Doing so may allow other patients with urgent needs to be scheduled in a cancelled appointment slot.     TEST RESULTS   If you have a MyChart account, your new results and a provider message will be sent to you through your MyChart account at [email or web address]. For results that require follow-up, a member of your healthcare team will also contact you directly.    PRESCRIPTION REFILL REQUESTS  To request prescription refills, please contact your pharmacy or send your healthcare team a  message through your MyChart account at [email or web address]  RECORD REQUESTS  For questions related to medical records, please call Medical Records Release of Information at [phone]  FINANCIAL COUNSELOR   For billing and other financial questions/needs - please contact Gwendlyn Deutscher [phone]. If you need to leave a message, please be sure to leave your full name, date of birth or MR#, best call back # and reason for call.    For educational material and resources:  [email or web address]  [place] COVID19 Vaccine Information: [email or web address]  ================================================================

## 2020-10-24 NOTE — Unmapped (Signed)
Addended by: [name] on: 10/24/2020 04:55 PM     Modules accepted: Level of Service

## 2020-10-30 DIAGNOSIS — K51919 Ulcerative colitis, unspecified with unspecified complications: Principal | ICD-10-CM

## 2020-10-30 DIAGNOSIS — Z79899 Other long term (current) drug therapy: Principal | ICD-10-CM

## 2020-10-30 DIAGNOSIS — K5130 Ulcerative (chronic) rectosigmoiditis without complications: Principal | ICD-10-CM

## 2020-11-12 DIAGNOSIS — K51918 Ulcerative colitis, unspecified with other complication: Principal | ICD-10-CM

## 2020-11-13 DIAGNOSIS — K5130 Ulcerative (chronic) rectosigmoiditis without complications: Principal | ICD-10-CM

## 2020-11-13 DIAGNOSIS — K51919 Ulcerative colitis, unspecified with unspecified complications: Principal | ICD-10-CM

## 2020-11-13 DIAGNOSIS — Z79899 Other long term (current) drug therapy: Principal | ICD-10-CM

## 2020-11-15 LAB — ALT: ALT (SGPT): 24 IU/L (ref 0–44)

## 2020-11-15 LAB — CBC W/ DIFFERENTIAL
BANDED NEUTROPHILS ABSOLUTE COUNT: 0 10*3/uL (ref 0.0–0.1)
BASOPHILS ABSOLUTE COUNT: 0 10*3/uL (ref 0.0–0.2)
BASOPHILS RELATIVE PERCENT: 1 %
EOSINOPHILS ABSOLUTE COUNT: 0.1 10*3/uL (ref 0.0–0.4)
EOSINOPHILS RELATIVE PERCENT: 1 %
HEMATOCRIT: 42.3 % (ref 37.5–51.0)
HEMOGLOBIN: 14.2 g/dL (ref 13.0–17.7)
IMMATURE GRANULOCYTES: 0 %
LYMPHOCYTES ABSOLUTE COUNT: 0.9 10*3/uL (ref 0.7–3.1)
LYMPHOCYTES RELATIVE PERCENT: 11 %
MEAN CORPUSCULAR HEMOGLOBIN CONC: 33.6 g/dL (ref 31.5–35.7)
MEAN CORPUSCULAR HEMOGLOBIN: 28.7 pg (ref 26.6–33.0)
MEAN CORPUSCULAR VOLUME: 86 fL (ref 79–97)
MONOCYTES ABSOLUTE COUNT: 0.6 10*3/uL (ref 0.1–0.9)
MONOCYTES RELATIVE PERCENT: 7 %
NEUTROPHILS ABSOLUTE COUNT: 6.4 10*3/uL (ref 1.4–7.0)
NEUTROPHILS RELATIVE PERCENT: 80 %
PLATELET COUNT: 247 10*3/uL (ref 150–450)
RED BLOOD CELL COUNT: 4.95 x10E6/uL (ref 4.14–5.80)
RED CELL DISTRIBUTION WIDTH: 16.2 % — ABNORMAL HIGH (ref 11.6–15.4)
WHITE BLOOD CELL COUNT: 7.9 10*3/uL (ref 3.4–10.8)

## 2020-11-15 LAB — AST: AST (SGOT): 19 IU/L (ref 0–40)

## 2020-11-24 ENCOUNTER — Institutional Professional Consult (permissible substitution): Admit: 2020-11-24 | Discharge: 2020-11-25 | Payer: PRIVATE HEALTH INSURANCE

## 2020-11-24 ENCOUNTER — Encounter: Admit: 2020-11-24 | Discharge: 2020-11-25 | Payer: PRIVATE HEALTH INSURANCE

## 2020-11-24 ENCOUNTER — Ambulatory Visit: Admit: 2020-11-24 | Discharge: 2020-11-25 | Payer: PRIVATE HEALTH INSURANCE

## 2020-11-24 DIAGNOSIS — K51918 Ulcerative colitis, unspecified with other complication: Principal | ICD-10-CM

## 2020-11-24 LAB — CBC W/ AUTO DIFF
BASOPHILS ABSOLUTE COUNT: 0.1 10*9/L (ref 0.0–0.1)
BASOPHILS RELATIVE PERCENT: 1.3 %
EOSINOPHILS ABSOLUTE COUNT: 0.3 10*9/L (ref 0.0–0.5)
EOSINOPHILS RELATIVE PERCENT: 5.7 %
HEMATOCRIT: 42.3 % (ref 39.0–48.0)
HEMOGLOBIN: 14 g/dL (ref 12.9–16.5)
LYMPHOCYTES ABSOLUTE COUNT: 1.6 10*9/L (ref 1.1–3.6)
LYMPHOCYTES RELATIVE PERCENT: 30.3 %
MEAN CORPUSCULAR HEMOGLOBIN CONC: 33.2 g/dL (ref 32.0–36.0)
MEAN CORPUSCULAR HEMOGLOBIN: 27.9 pg (ref 25.9–32.4)
MEAN CORPUSCULAR VOLUME: 84.1 fL (ref 77.6–95.7)
MEAN PLATELET VOLUME: 8.2 fL (ref 6.8–10.7)
MONOCYTES ABSOLUTE COUNT: 0.6 10*9/L (ref 0.3–0.8)
MONOCYTES RELATIVE PERCENT: 11.1 %
NEUTROPHILS ABSOLUTE COUNT: 2.7 10*9/L (ref 1.8–7.8)
NEUTROPHILS RELATIVE PERCENT: 51.6 %
PLATELET COUNT: 258 10*9/L (ref 150–450)
RED BLOOD CELL COUNT: 5.03 10*12/L (ref 4.26–5.60)
RED CELL DISTRIBUTION WIDTH: 17.2 % — ABNORMAL HIGH (ref 12.2–15.2)
WBC ADJUSTED: 5.3 10*9/L (ref 3.6–11.2)

## 2020-11-24 LAB — COMPREHENSIVE METABOLIC PANEL
ALBUMIN: 4.1 g/dL (ref 3.4–5.0)
ALKALINE PHOSPHATASE: 67 U/L (ref 46–116)
ALT (SGPT): 26 U/L (ref 10–49)
ANION GAP: 7 mmol/L (ref 5–14)
AST (SGOT): 19 U/L (ref <=34)
BILIRUBIN TOTAL: 0.7 mg/dL (ref 0.3–1.2)
BLOOD UREA NITROGEN: 10 mg/dL (ref 9–23)
BUN / CREAT RATIO: 11
CALCIUM: 10 mg/dL (ref 8.7–10.4)
CHLORIDE: 108 mmol/L — ABNORMAL HIGH (ref 98–107)
CO2: 27 mmol/L (ref 20.0–31.0)
CREATININE: 0.92 mg/dL
EGFR CKD-EPI (2021) MALE: >90 mL/min/{1.73_m2} (ref >=60)
GLUCOSE RANDOM: 100 mg/dL — ABNORMAL HIGH (ref 70–99)
POTASSIUM: 4 mmol/L (ref 3.4–4.8)
PROTEIN TOTAL: 7.5 g/dL (ref 5.7–8.2)
SODIUM: 142 mmol/L (ref 135–145)

## 2020-11-24 LAB — PROTIME-INR
INR: 0.93
PROTIME: 10.5 s (ref 9.8–12.8)

## 2020-11-24 LAB — APTT
APTT: 32.3 s (ref 25.1–36.5)
HEPARIN CORRELATION: 0.2

## 2020-11-24 NOTE — Unmapped (Addendum)
Your surgery is scheduled for 12/12/2020 with Dr. Miachel Roux.  Pre-care will call you the day before your surgery with your instructions and arrival time.    Covid testing is required 48-72 hours prior to your surgery date.  [facility] accepts the PCR and NAAT tests.  [facility] offers both types of tests.  Please send a copy of the results to me at the fax number or email address below. Also, please bring a copy with you the day of surgery.    Blood work has been ordered for you to be drawn today.  Please stop by the lab on your way out of the hospital.    Your post-op appointment is scheduled for 12/25/2020 at 9:30.  I have also scheduled an appointment with the ostomy nurse for you to follow Dr. Burnice Logan appointment.    Please call with any questions or concerns.      Felizardo Hoffmann, RN  [facility]  Nurse Coordinator for Dr. Elita QuickMiachel Roux  [phone]  Fax: [phone]  [email or web address]

## 2020-11-24 NOTE — Unmapped (Signed)
[facility] Services  OSTOMY CLINIC VISIT NOTE     Reason for Consult:   - Ileostomy  - Initial  - Pouching Supplies    Problem List:   Active Problems:    * No active hospital problems. *    Assessment: Patient and wife requested ostomy resource appointment in GI surgery clinic. Patient has an ileostomy. Wife was concerned that the supplies they received from [employer] did not have a way for him to burp the gas from the pouch. The 8958 is a 1 piece pouch and burping is not an option. Reviewed the 2 piece option with patient and wife. Patient determined that he prefers the 2 piece pouch system due to needing to burp it frequently. Settled on using an opaque pouch with the 2 piece system.  Patient declined performing a pouch change to allow for peristomal skin assessment at this time. He states he is doing well, without irritation.     Teaching/Instructions:  - Obtaining ostomy supplies different ostomy pouching options for his ileostomy.  - burping pouches with 2 piece    Recommendations/Plan:   Provided patient and wife ostomy supply pouching numbers for the 2 piece soft convex pouching system by hollister with opaque pouch    Plan of Care Discussed With:  - Patient  - Significant other    Ostomy Product List:    [employer] 2 piece soft convex wafer CTF red [id number]  [employer] 2 piece drainable pouch [id number]  Moldable barrier ring  Stoma powder  61M no sting barrier wipes       Workup Time:  15 minutes   Reconsult for questions/concerns/deterioration.   PAGE VIA  [facility] Health Directory  Available by Epic Chat   Renaye Rakers RN, BSN, CWOCN

## 2020-11-24 NOTE — Unmapped (Signed)
LOWER GASTROINTESTINAL SURGERY CLINIC NOTE    Patient Name: Kevin Tran  Patient MRN: [id number]  Date of Service: 11/24/2020  Attending: [name], MD    Reason for Visit   Discussion of Surgical Intervention    Attending Surgeon A/P     As noted, patient RTC 1 month after completion of steroids. He looks and feels well. He has gained weight since last visit. On exam he has a benign abdomen.   Surgical options and risks discussed. He has been consented for a Robotic proctectomy, takedown of ileostomy and creation of IPAA with proximal DLI. The risk which include but are not limited to: anastomotic leak, fistula formation, bowel obstruction, bladder and sexual dysfunction, poor bowel function (clustering and frequency of bowel movements), possible need to covert to an open procedure, bleeding, wound infection, prolonged hospital stay, DVT, PE, MI, possible positive margins, need for other therapies, etc were discussed at length.  Questions were answered to the patient's satisfaction and they wish to proceed with plans as outlined.   I saw and evaluated the patient, participating in the key portions of the service.  Case d/w Dr Judeth Cornfield who agrees with plans. ?? I reviewed the resident???s note.?? I agree with the resident???s findings and plan. Laverda Page, MD        Assessment   Kevin Tran is a 60 y.o. male with a PMH significant for UC who is s/p TAC who presents to clinic today for preoperative evaluation for IPAA with DLI creation. He reports he is doing well is able to maintain an adequate nutritional status. At the recommendation of his GI physician, he was to wait one month after completion of steroid taper to undergo surgery. Per patient report and chart review, tomorrow is one month since completing the prednisone taper. Risks/benefits were discussed with the patient, and he wishes to proceed with ileostomy takedown.     Plan   ?? Consent obtained  ?? Scheduled for OR  ?? RTC postoperatively  ?? Discuss with GI physician about holding methotrexate perioperatively    Subjective   HPI: Kevin Tran is a 60 y.o. male ulcerative colitis who is s/p robotic, laparoscopic assisted total abdominal colectomy with end Ileostomy??on 06/29/20 who returns to clinic today for surgical planning. Today, he states he is doing well and has had a good appettite.  Has been following with rheumatology and GI medicine for persistent joint pain. He finished a steroid taper and is currently maintained on MTX. He denies any nausea, vomiting, fever or abdominal pains. Patient reports no changes in his health since prior clinic visit. There are no concerns at this time and patient is eager to discuss the next steps.    Review of Systems: A 12 system review of systems was negative except as noted in the HPI.    Allergies  No Known Allergies  Past Medical History  Past Medical History:   Diagnosis Date   ??? Arthritis    ??? Pneumothorax    ??? Ulcerative colitis (CMS-HCC)      Past Surgical History   Past Surgical History:   Procedure Laterality Date   ??? BACK SURGERY  1996   ??? COLONOSCOPY     ??? PR COLONOSCOPY W/BIOPSY SINGLE/MULTIPLE Left 07/01/2018    Procedure: COLONOSCOPY, FLEXIBLE, PROXIMAL TO SPLENIC FLEXURE; WITH BIOPSY, SINGLE OR MULTIPLE;  Surgeon: [name], MD;  Location: [facility];  Service: Gastroenterology   ??? PR COLONOSCOPY W/BIOPSY SINGLE/MULTIPLE  10/20/2019    Procedure: COLONOSCOPY, FLEXIBLE, PROXIMAL TO SPLENIC FLEXURE; WITH BIOPSY, SINGLE OR MULTIPLE;  Surgeon: Luanne Bras, MD;  Location: [facility];  Service: Gastroenterology   ??? PR LAP, SURG ENTEROLYSIS Midline 06/29/2020    Procedure: ROBOTIC XI LAPAROSCOPY, SURGICAL, ENTEROLYSIS (FREEING OF INTESTINAL ADHESION) (SEPARATE PROCEDURE);  Surgeon: [name], MD;  Location: MAIN OR [facility];  Service: Gastrointestinal   ??? PR LAP,SURG,COLECTOMY,TOTAL,W/O PROCTECTOMY N/A 06/29/2020    Procedure: ROBOTIC XI LAPAROSCOPY, SURGICAL; COLECTOMY, TOTAL, ABDOMINAL, WITHOUT PROCTECTOMY, WITH ILEOSTOMY OR ILEOPROCTOSTOMY;  Surgeon: [name], MD;  Location: MAIN OR [place];  Service: Gastrointestinal   ??? PR SIGMOIDOSCOPY,BIOPSY N/A 03/28/2020    Procedure: SIGMOIDOSCOPY, FLEXIBLE; WITH BIOPSY, SINGLE OR MULTIPLE;  Surgeon: Luanne Bras, MD;  Location: [facility] OR [facility];  Service: Gastroenterology     Medications  Current Outpatient Medications   Medication Sig Dispense Refill   ??? acetaminophen (TYLENOL) 500 MG tablet Take 1 tablet (500 mg total) by mouth every six (6) hours as needed for pain. 40 tablet 0   ??? folic acid (FOLVITE) 1 MG tablet Take 1 tablet (1 mg total) by mouth in the morning. 30 tablet 11   ??? melatonin 3 mg Tab Take 1 tablet (3 mg total) by mouth nightly as needed.  0   ??? methotrexate 2.5 MG tablet TAKE 10 TABLETS BY MOUTH ONCE A WEEK 120 tablet 0   ??? multivitamin (TAB-A-VITE/THERAGRAN) per tablet Take 1 tablet by mouth daily.     ??? omeprazole (PRILOSEC OTC) 20 MG tablet Take 20 mg by mouth daily.     ??? pregabalin (LYRICA) 100 MG capsule Take 100 mg by mouth Two (2) times a day.       No current facility-administered medications for this visit.     Family History  Family History   Problem Relation Age of Onset   ??? Cancer Mother      Social History  Social History     Socioeconomic History   ??? Marital status: Married     Spouse name: None   ??? Number of children: None   ??? Years of education: None   ??? Highest education level: None   Tobacco Use   ??? Smoking status: Former Smoker     Types: Cigarettes     Quit date: 2006     Years since quitting: 16.7   ??? Smokeless tobacco: Never Used   Vaping Use   ??? Vaping Use: Never used   Substance and Sexual Activity   ??? Alcohol use: Not Currently   ??? Drug use: No   ??? Sexual activity: Yes     Partners: Female     Objective     Vitals:    11/24/20 0938   BP: 143/93   Pulse: 56   Temp: 36.5 ??C (97.7 ??F)       Physical Exam:  General: alert, well appearing, in no acute distress  Cardiovascular: regular rate, normotensive  Pulmonary: breathing comfortably on room air  Abdominal: soft, nondistended and nontender to palpation. Scars from previous surgeries are well healed.. Ileostomy appliance intact.   Extremities: warm and well perfused bilaterally, no edema  Skin: skin color, texture, and turgor are normal. No visible rashes or suspicious lesions  Musculoskeletal: no joint tenderness, deformity, swelling, or muscular tenderness noted. Full range of motion without pain  Neurologic: AAOx3. No visible tremor. Grossly intact neurologically  Psychiatric: alert and oriented to person, place, and time. Judgement and  insight are appropriate      Joellen Jersey, MD  [facility] General Surgery PGY-1

## 2020-11-27 DIAGNOSIS — K5130 Ulcerative (chronic) rectosigmoiditis without complications: Principal | ICD-10-CM

## 2020-11-27 DIAGNOSIS — Z79899 Other long term (current) drug therapy: Principal | ICD-10-CM

## 2020-11-27 DIAGNOSIS — K51919 Ulcerative colitis, unspecified with unspecified complications: Principal | ICD-10-CM

## 2020-12-07 NOTE — Unmapped (Signed)
Reason For Call:  Patient's wife Zella Ball called regarding holding methotrexate prior to surgery on 12/12/20     Assessment:   Per Dr Raphael Gibney, pt should hold his methotrexate, due on 12/11/20 and not resume until cleared by surgery at post op visit    Instructions Provided:  Provided above information to pt. He agreed.      Follow Up:  - Patient verbalized understanding and agreement with plan       Workup Time:   

## 2020-12-11 DIAGNOSIS — K5130 Ulcerative (chronic) rectosigmoiditis without complications: Principal | ICD-10-CM

## 2020-12-11 DIAGNOSIS — Z79899 Other long term (current) drug therapy: Principal | ICD-10-CM

## 2020-12-11 DIAGNOSIS — K51919 Ulcerative colitis, unspecified with unspecified complications: Principal | ICD-10-CM

## 2020-12-12 ENCOUNTER — Ambulatory Visit: Admit: 2020-12-12 | Discharge: 2020-12-19 | Payer: PRIVATE HEALTH INSURANCE

## 2020-12-12 ENCOUNTER — Encounter
Admit: 2020-12-12 | Payer: PRIVATE HEALTH INSURANCE | Attending: Student in an Organized Health Care Education/Training Program

## 2020-12-12 ENCOUNTER — Ambulatory Visit
Admit: 2020-12-12 | Discharge: 2020-12-19 | Disposition: A | Discharge status: Home / Self Care | Payer: PRIVATE HEALTH INSURANCE

## 2020-12-12 LAB — CBC
HEMATOCRIT: 36.2 % — ABNORMAL LOW (ref 39.0–48.0)
HEMOGLOBIN: 12.2 g/dL — ABNORMAL LOW (ref 12.9–16.5)
MEAN CORPUSCULAR HEMOGLOBIN CONC: 33.8 g/dL (ref 32.0–36.0)
MEAN CORPUSCULAR HEMOGLOBIN: 28.8 pg (ref 25.9–32.4)
MEAN CORPUSCULAR VOLUME: 85.1 fL (ref 77.6–95.7)
MEAN PLATELET VOLUME: 8 fL (ref 6.8–10.7)
PLATELET COUNT: 212 10*9/L (ref 150–450)
RED BLOOD CELL COUNT: 4.25 10*12/L — ABNORMAL LOW (ref 4.26–5.60)
RED CELL DISTRIBUTION WIDTH: 16.3 % — ABNORMAL HIGH (ref 12.2–15.2)
WBC ADJUSTED: 14.4 10*9/L — ABNORMAL HIGH (ref 3.6–11.2)

## 2020-12-12 LAB — BASIC METABOLIC PANEL
ANION GAP: 10 mmol/L (ref 5–14)
BLOOD UREA NITROGEN: 15 mg/dL (ref 9–23)
BUN / CREAT RATIO: 17
CALCIUM: 8.2 mg/dL — ABNORMAL LOW (ref 8.7–10.4)
CHLORIDE: 109 mmol/L — ABNORMAL HIGH (ref 98–107)
CO2: 23 mmol/L (ref 20.0–31.0)
CREATININE: 0.86 mg/dL
EGFR CKD-EPI (2021) MALE: >90 mL/min/{1.73_m2} (ref >=60)
GLUCOSE RANDOM: 129 mg/dL — ABNORMAL HIGH (ref 70–99)
POTASSIUM: 4.1 mmol/L (ref 3.4–4.8)
SODIUM: 142 mmol/L (ref 135–145)

## 2020-12-12 LAB — PHOSPHORUS: PHOSPHORUS: 2.8 mg/dL (ref 2.4–5.1)

## 2020-12-12 LAB — MAGNESIUM: MAGNESIUM: 1.5 mg/dL — ABNORMAL LOW (ref 1.6–2.6)

## 2020-12-12 MED ADMIN — ROCuronium (ZEMURON) injection: INTRAVENOUS | @ 18:00:00 | Stop: 2020-12-12

## 2020-12-12 MED ADMIN — ROCuronium (ZEMURON) injection: INTRAVENOUS | @ 12:00:00 | Stop: 2020-12-12

## 2020-12-12 MED ADMIN — phenylephrine 0.8 mg/10 mL (80 mcg/mL) injection: INTRAVENOUS | @ 17:00:00 | Stop: 2020-12-12

## 2020-12-12 MED ADMIN — ROCuronium (ZEMURON) injection: INTRAVENOUS | @ 20:00:00 | Stop: 2020-12-12

## 2020-12-12 MED ADMIN — ROCuronium (ZEMURON) injection: INTRAVENOUS | @ 14:00:00 | Stop: 2020-12-12

## 2020-12-12 MED ADMIN — ROCuronium (ZEMURON) injection: INTRAVENOUS | @ 13:00:00 | Stop: 2020-12-12

## 2020-12-12 MED ADMIN — succinylcholine (ANECTINE) injection: INTRAVENOUS | @ 12:00:00 | Stop: 2020-12-12

## 2020-12-12 MED ADMIN — sodium chloride irrigation (NS) 0.9 % irrigation solution: @ 17:00:00 | Stop: 2020-12-12

## 2020-12-12 MED ADMIN — ROCuronium (ZEMURON) injection: INTRAVENOUS | @ 17:00:00 | Stop: 2020-12-12

## 2020-12-12 MED ADMIN — fentaNYL (PF) (SUBLIMAZE) injection: INTRAVENOUS | @ 12:00:00 | Stop: 2020-12-12

## 2020-12-12 MED ADMIN — metroNIDAZOLE (FLAGYL) IVPB 500 mg: 500 mg | INTRAVENOUS | @ 13:00:00 | Stop: 2020-12-12

## 2020-12-12 MED ADMIN — ePHEDrine (PF) 25 mg/5 mL (5 mg/mL) in 0.9% sodium chloride syringe Syrg: INTRAVENOUS | @ 20:00:00 | Stop: 2020-12-12

## 2020-12-12 MED ADMIN — ROCuronium (ZEMURON) injection: INTRAVENOUS | @ 22:00:00 | Stop: 2020-12-12

## 2020-12-12 MED ADMIN — dexamethasone (DECADRON) 4 mg/mL injection: INTRAVENOUS | @ 12:00:00 | Stop: 2020-12-12

## 2020-12-12 MED ADMIN — phenylephrine 0.8 mg/10 mL (80 mcg/mL) injection: INTRAVENOUS | @ 18:00:00 | Stop: 2020-12-12

## 2020-12-12 MED ADMIN — dexmedeTOMIDine (PRECEDEX) injection: INTRAVENOUS | @ 12:00:00 | Stop: 2020-12-12

## 2020-12-12 MED ADMIN — ROCuronium (ZEMURON) injection: INTRAVENOUS | @ 19:00:00 | Stop: 2020-12-12

## 2020-12-12 MED ADMIN — propofoL (DIPRIVAN) injection: INTRAVENOUS | @ 23:00:00 | Stop: 2020-12-12

## 2020-12-12 MED ADMIN — phenylephrine 0.8 mg/10 mL (80 mcg/mL) injection: INTRAVENOUS | @ 21:00:00 | Stop: 2020-12-12

## 2020-12-12 MED ADMIN — phenylephrine 0.8 mg/10 mL (80 mcg/mL) injection: INTRAVENOUS | @ 20:00:00 | Stop: 2020-12-12

## 2020-12-12 MED ADMIN — ROCuronium (ZEMURON) injection: INTRAVENOUS | @ 23:00:00 | Stop: 2020-12-12

## 2020-12-12 MED ADMIN — lactated Ringers infusion: 10 mL/h | INTRAVENOUS | @ 11:00:00 | Stop: 2020-12-12

## 2020-12-12 MED ADMIN — midazolam (VERSED) injection: INTRAVENOUS | @ 11:00:00 | Stop: 2020-12-12

## 2020-12-12 MED ADMIN — fentaNYL (PF) (SUBLIMAZE) injection: INTRAVENOUS | @ 23:00:00 | Stop: 2020-12-12

## 2020-12-12 MED ADMIN — cefTRIAXone (ROCEPHIN) 2 g in sodium chloride 0.9 % (NS) 100 mL IVPB-connector bag: 2 g | INTRAVENOUS | @ 13:00:00 | Stop: 2020-12-12

## 2020-12-12 MED ADMIN — bupivacaine liposome (PF) (EXPAREL) 266 mg, sodium chloride (NS) 0.9 % 1 mL infiltration injection: 266 mg | @ 12:00:00 | Stop: 2020-12-12

## 2020-12-12 MED ADMIN — bupivacaine (PF) (MARCAINE) 0.25 % (2.5 mg/mL) injection (PF): PERINEURAL | @ 12:00:00 | Stop: 2020-12-12

## 2020-12-12 MED ADMIN — acetaminophen (OFIRMEV) 10 mg/mL injection: INTRAVENOUS | @ 20:00:00 | Stop: 2020-12-12

## 2020-12-12 MED ADMIN — propofoL (DIPRIVAN) injection: INTRAVENOUS | @ 12:00:00 | Stop: 2020-12-12

## 2020-12-12 MED ADMIN — phenylephrine 0.8 mg/10 mL (80 mcg/mL) injection: INTRAVENOUS | @ 16:00:00 | Stop: 2020-12-12

## 2020-12-12 MED ADMIN — sugammadex (BRIDION) injection: INTRAVENOUS | @ 23:00:00 | Stop: 2020-12-12

## 2020-12-12 MED ADMIN — sodium chloride irrigation (NS) 0.9 % irrigation solution: @ 22:00:00 | Stop: 2020-12-12

## 2020-12-12 MED ADMIN — electrolyte-A (PLASMA-LYT A) infusion: INTRAVENOUS | @ 12:00:00 | Stop: 2020-12-12

## 2020-12-12 MED ADMIN — ROCuronium (ZEMURON) injection: INTRAVENOUS | @ 15:00:00 | Stop: 2020-12-12

## 2020-12-12 MED ADMIN — indocyanine green (IC-GREEN) injection: INTRAVENOUS | @ 22:00:00 | Stop: 2020-12-12

## 2020-12-12 MED ADMIN — phenylephrine 0.8 mg/10 mL (80 mcg/mL) injection: INTRAVENOUS | @ 19:00:00 | Stop: 2020-12-12

## 2020-12-12 MED ADMIN — heparin (porcine) 5,000 unit/mL injection 5,000 Units: 5000 [IU] | SUBCUTANEOUS | @ 11:00:00 | Stop: 2020-12-12

## 2020-12-12 MED ADMIN — propofoL (DIPRIVAN) injection: INTRAVENOUS | @ 13:00:00 | Stop: 2020-12-12

## 2020-12-12 MED ADMIN — HYDROmorphone (PF) (DILAUDID) injection: INTRAVENOUS | Stop: 2020-12-12

## 2020-12-12 MED ADMIN — lidocaine (XYLOCAINE) 20 mg/mL (2 %) injection: INTRAVENOUS | @ 12:00:00 | Stop: 2020-12-12

## 2020-12-12 MED ADMIN — metroNIDAZOLE (FLAGYL) IVPB 500 mg: 500 mg | INTRAVENOUS | @ 21:00:00 | Stop: 2020-12-12

## 2020-12-12 MED ADMIN — sodium chloride (NS) 0.9 % infusion: INTRAVENOUS | @ 22:00:00 | Stop: 2020-12-12

## 2020-12-12 MED ADMIN — phenylephrine 0.8 mg/10 mL (80 mcg/mL) injection: INTRAVENOUS | @ 22:00:00 | Stop: 2020-12-12

## 2020-12-12 MED ADMIN — dexmedetomidine 400 mcg in sodium chloride 0.9% 100 ml (4 mcg/mL) infusion PMB: INTRAVENOUS | @ 14:00:00 | Stop: 2020-12-12

## 2020-12-12 MED ADMIN — phenylephrine 0.8 mg/10 mL (80 mcg/mL) injection: INTRAVENOUS | @ 23:00:00 | Stop: 2020-12-12

## 2020-12-12 MED ADMIN — ROCuronium (ZEMURON) injection: INTRAVENOUS | @ 21:00:00 | Stop: 2020-12-12

## 2020-12-12 MED ADMIN — pregabalin (LYRICA) capsule 100 mg: 100 mg | ORAL | @ 11:00:00 | Stop: 2020-12-12

## 2020-12-12 MED ADMIN — ondansetron (ZOFRAN) injection: INTRAVENOUS | @ 23:00:00 | Stop: 2020-12-12

## 2020-12-12 MED ADMIN — fentaNYL (PF) (SUBLIMAZE) injection: INTRAVENOUS | Stop: 2020-12-12

## 2020-12-12 MED ADMIN — albumin human 5 % 5 % bottle: INTRAVENOUS | @ 19:00:00 | Stop: 2020-12-12

## 2020-12-12 MED ADMIN — ketamine (KETALAR) injection: INTRAVENOUS | @ 20:00:00 | Stop: 2020-12-12

## 2020-12-12 MED ADMIN — ROCuronium (ZEMURON) injection: INTRAVENOUS | @ 16:00:00 | Stop: 2020-12-12

## 2020-12-12 MED ADMIN — fentaNYL (PF) (SUBLIMAZE) injection: INTRAVENOUS | @ 14:00:00 | Stop: 2020-12-12

## 2020-12-12 NOTE — Unmapped (Addendum)
Kevin Tran is a 60 y.o. male who underwent a proctectomy with J-pouch creation. Ischemic small bowel of distal J-pouch with minimal to no blood supply confirmed with ICG; thus ischemic small bowel was resected and end ileostomy was created on 12/12/2020. He tolerated the procedure well, was extubated in the OR, and was taken to the PACU and received routine postoperative care before being transferred to the floor. Patient developed nausea and vomiting on POD 3. NG tube was placed with 1.8 L out over the first 2 hrs. Replacement fluids were initiated***   He was able to void without difficulty after his urinary catheter was removed.*** His diet was slowly advanced and at the time of discharge, he was tolerating a regular diet. Wound and ostomy nurses taught the patient how to care for their ostomy and change his appliance, and he was able to demonstrate this appropriately. The patient will be discharged on prophylactic Lovenox due to having inflammatory bowel disease and the associated higher risk of VTE, and was taught how to administer the injections by nursing during his hospitalization. He is being discharged on 12/15/2020.

## 2020-12-13 LAB — CBC
HEMATOCRIT: 37.8 % — ABNORMAL LOW (ref 39.0–48.0)
HEMOGLOBIN: 12.6 g/dL — ABNORMAL LOW (ref 12.9–16.5)
MEAN CORPUSCULAR HEMOGLOBIN CONC: 33.2 g/dL (ref 32.0–36.0)
MEAN CORPUSCULAR HEMOGLOBIN: 28.2 pg (ref 25.9–32.4)
MEAN CORPUSCULAR VOLUME: 84.9 fL (ref 77.6–95.7)
MEAN PLATELET VOLUME: 8.3 fL (ref 6.8–10.7)
PLATELET COUNT: 218 10*9/L (ref 150–450)
RED BLOOD CELL COUNT: 4.46 10*12/L (ref 4.26–5.60)
RED CELL DISTRIBUTION WIDTH: 16.8 % — ABNORMAL HIGH (ref 12.2–15.2)
WBC ADJUSTED: 15.2 10*9/L — ABNORMAL HIGH (ref 3.6–11.2)

## 2020-12-13 LAB — BASIC METABOLIC PANEL
ANION GAP: 8 mmol/L (ref 5–14)
BLOOD UREA NITROGEN: 9 mg/dL (ref 9–23)
BUN / CREAT RATIO: 13
CALCIUM: 8.5 mg/dL — ABNORMAL LOW (ref 8.7–10.4)
CHLORIDE: 106 mmol/L (ref 98–107)
CO2: 27 mmol/L (ref 20.0–31.0)
CREATININE: 0.71 mg/dL
EGFR CKD-EPI (2021) MALE: >90 mL/min/{1.73_m2} (ref >=60)
GLUCOSE RANDOM: 124 mg/dL (ref 70–179)
POTASSIUM: 4 mmol/L (ref 3.4–4.8)
SODIUM: 141 mmol/L (ref 135–145)

## 2020-12-13 LAB — PHOSPHORUS: PHOSPHORUS: 2.9 mg/dL (ref 2.4–5.1)

## 2020-12-13 LAB — MAGNESIUM: MAGNESIUM: 2.5 mg/dL (ref 1.6–2.6)

## 2020-12-13 MED ADMIN — lactated Ringers infusion: 50 mL/h | INTRAVENOUS | @ 16:00:00

## 2020-12-13 MED ADMIN — calcium carbonate (TUMS) chewable tablet 200 mg of elem calcium: 200 mg | ORAL

## 2020-12-13 MED ADMIN — MORPhine 4 mg/mL injection 2 mg: 2 mg | INTRAVENOUS | Stop: 2020-12-12

## 2020-12-13 MED ADMIN — fentaNYL (PF) (SUBLIMAZE) injection 25 mcg: 25 ug | INTRAVENOUS | @ 01:00:00 | Stop: 2020-12-12

## 2020-12-13 MED ADMIN — HYDROmorphone (PF) (DILAUDID) injection 0.5 mg: .5 mg | INTRAVENOUS | @ 02:00:00 | Stop: 2020-12-12

## 2020-12-13 MED ADMIN — pregabalin (LYRICA) capsule 100 mg: 100 mg | ORAL | @ 19:00:00

## 2020-12-13 MED ADMIN — HYDROmorphone (PF) (DILAUDID) injection 0.5 mg: .5 mg | INTRAVENOUS | @ 21:00:00 | Stop: 2020-12-27

## 2020-12-13 MED ADMIN — ondansetron (ZOFRAN) injection 4 mg: 4 mg | INTRAVENOUS | @ 13:00:00

## 2020-12-13 MED ADMIN — HYDROcodone-acetaminophen (NORCO) 5-325 mg per tablet 1 tablet: 1 | ORAL | @ 19:00:00 | Stop: 2020-12-27

## 2020-12-13 MED ADMIN — ketorolac (TORADOL) injection 15 mg: 15 mg | INTRAVENOUS | @ 14:00:00 | Stop: 2020-12-13

## 2020-12-13 MED ADMIN — acetaminophen (TYLENOL) tablet 1,000 mg: 1000 mg | ORAL | @ 02:00:00 | Stop: 2020-12-12

## 2020-12-13 MED ADMIN — lactated Ringers infusion: 125 mL/h | INTRAVENOUS | @ 03:00:00

## 2020-12-13 MED ADMIN — magnesium sulfate in D5W 1 gram/100 mL infusion 1 g: 1 g | INTRAVENOUS | @ 05:00:00 | Stop: 2020-12-13

## 2020-12-13 MED ADMIN — oxyCODONE (ROXICODONE) immediate release tablet 10 mg: 10 mg | ORAL | @ 06:00:00 | Stop: 2020-12-13

## 2020-12-13 MED ADMIN — ondansetron (ZOFRAN-ODT) disintegrating tablet 4 mg: 4 mg | ORAL | @ 03:00:00

## 2020-12-13 MED ADMIN — HYDROcodone-acetaminophen (NORCO) 5-325 mg per tablet 2 tablet: 2 | ORAL | @ 13:00:00 | Stop: 2020-12-27

## 2020-12-13 MED ADMIN — HYDROmorphone (PF) (DILAUDID) injection 1 mg: 1 mg | INTRAVENOUS | @ 10:00:00 | Stop: 2020-12-13

## 2020-12-13 MED ADMIN — HYDROcodone-acetaminophen (NORCO) 5-325 mg per tablet 2 tablet: 2 | ORAL | Stop: 2020-12-27

## 2020-12-13 MED ADMIN — fentaNYL (PF) (SUBLIMAZE) injection 25 mcg: 25 ug | INTRAVENOUS | Stop: 2020-12-12

## 2020-12-13 MED ADMIN — magnesium sulfate in D5W 1 gram/100 mL infusion 1 g: 1 g | INTRAVENOUS | @ 04:00:00 | Stop: 2020-12-13

## 2020-12-13 MED ADMIN — HYDROmorphone (PF) (DILAUDID) injection 1 mg: 1 mg | INTRAVENOUS | @ 06:00:00 | Stop: 2020-12-13

## 2020-12-13 MED ADMIN — oxyCODONE (ROXICODONE) immediate release tablet 5 mg: 5 mg | ORAL | @ 02:00:00 | Stop: 2020-12-12

## 2020-12-13 MED ADMIN — enoxaparin (LOVENOX) syringe 40 mg: 40 mg | SUBCUTANEOUS | @ 13:00:00

## 2020-12-13 MED ADMIN — HYDROmorphone (PF) (DILAUDID) injection 0.5 mg: .5 mg | INTRAVENOUS | @ 15:00:00 | Stop: 2020-12-27

## 2020-12-13 MED ADMIN — pantoprazole (PROTONIX) EC tablet 40 mg: 40 mg | ORAL | @ 10:00:00

## 2020-12-13 MED ADMIN — ondansetron (ZOFRAN) injection 4 mg: 4 mg | INTRAVENOUS | @ 06:00:00

## 2020-12-13 MED ADMIN — magnesium sulfate in D5W 1 gram/100 mL infusion 1 g: 1 g | INTRAVENOUS | @ 06:00:00 | Stop: 2020-12-13

## 2020-12-13 MED ADMIN — pregabalin (LYRICA) capsule 100 mg: 100 mg | ORAL | @ 13:00:00

## 2020-12-13 MED ADMIN — pregabalin (LYRICA) capsule 100 mg: 100 mg | ORAL | @ 03:00:00

## 2020-12-13 NOTE — Unmapped (Addendum)
WOCN Consult Services  OSTOMY VISIT NOTE     Reason for Consult:   - Initial  - Ostomy Care  - Ostomy Teaching    Problem List:   Principal Problem:    Ulcerative colitis, acute, other complication (CMS-HCC)    Assessment:     CWOCN consulted for ostomy teaching.  Patient in for stoma revision after ischemic J-pouch.      Stoma Type:  -  Ileostomy Stoma Location:  - RLQ (Right Lower Quadrant)     Stoma Characteristics:  - Oval  - Minimal height  - Os location skin level 12 o'clock Stoma Mucosal Condition and Color:  - Moist  - Edematous  - Pink     Mucocutaneous Junction:  - Intact    Rod/Stents:  - No     Output:  - Blood tinged  - bowel sweat     Peristomal Skin Condition:   - shiny skin stripping   -incisions next to stoma with sutures    Abdominal Contours:  - Distended  - Soft    Pouching System:  - 1 Piece  - CTF (Cut to fit)  - soft convex Anticipated Wear Time of Pouching System:  - To be determined     Teaching Limitations/Considerations:   - none    Teaching/Instructions:  - Discussed agreed upon goals that the patient/family member would be able to empty and change ostomy pouch, have the instructions needed to order home supplies and know how to trouble shoot pouch leakage and common peristomal skin problems.  - Discussed and demonstrated pouch emptying technique and pouch burping technique to empty flatus from pouch.  - Patient given instruction and provided demonstration of ostomy care including removal of old pouch, cleansing of the peristomal skin, measuring the stoma, cutting out/shaping the wafer, applying moldable barrier ring, and application of wafer/pouch to skin.  - Discussed anticipated pouch change frequency and need to change with any signs the pouch may be leaking.  - Discussed activities of daily living and bathing.    Ostomy Home Starter Kit - Verbal Consent Obtained:  - Not discussed    Recommendations/Plan:   - Patient will need more ostomy teaching prior to discharge, WOC nurse will continue to follow.  - WOC nurse will follow up in 1 days for pouch evaluation.  - Pending discharge ostomy supply list.    Ostomy Discharge Goals:  - More teaching needed prior to discharge.     Recommended Consults:   - Case Management Plan of Care Discussed With:  - Patient  - Family     Ostomy Supplies:   - In-patient supplies at the bedside.  - Supplies available on unit.    Ostomy Product List:  Hollister 1-Piece Soft Convex CTF Pouch 1 1/2 [phone] / [phone])  Copywriter, advertising- ([id number])  Casey Burkitt Adhesive Remover Wipes- [phone] / [phone])  Marathon Skin Protectant- ([id number])  42M No-Sting Barrier Film- Pads- (050338/3344)  42M No-Sting Barrier Film- Spray- (050858/3346)  Hollister Stoma Powder- (050829/7906)  Hollister Ostomy [place] 23-43- ([phone])  Engineer, technical sales Elastic Strip- ([id number])     WOCN Follow-up:  - tomorrow    Workup Time:  45 minutes, second nurse with teaching today     Edythe Clarity, RN, BSN, [place], [facility]  Pager [phone]  Office 4 [phone]

## 2020-12-13 NOTE — Unmapped (Signed)
Lower Gastrointestinal Surgery Progress Note    Today's Date: 12/13/2020  Admission Date: 12/12/2020  Length of Stay: Day 1, 1 Day Post-Op  Hospital Service: [facility])  Attending Physician: [name], MD     Assessment   Principal Problem:    Ulcerative colitis, acute, other complication (CMS-HCC)    AHAN EISENBERGER is a 60 y.o. male with a medical history significant for UC who is s/p robotic, laparoscopic assisted total abdominal colectomy with end Ileostomy??on 06/29/20 who was admitted to the hospital on 12/12/20 for a planned Ileoanal anastomosis(J-pouch surgery) which was converted to an end ileostomy due to concern for poor perfusion of the J-Pouch intraoperatively.     Subjective   NAEON. AVSS, Pain well controlled, Foley in place for UOP monitoring, AROBF.     Plan   Neuro/Psych:  -IV and PO pain, nausea medications as needed. Prefers Norco, less nausea     CV/Pulm:  -HDS, oxygenating well on RA.     FEN/GI:  -IVF: @50  ml/hr  -diet: Nutrition Therapy Clear Liquid on PPI  -strict I/O's    Renal/GU:  - Foley Catheter for UOP monitoring     Heme/ID:  -H&H stable, no active signs/symptoms of bleeding    Prophylaxis: prophylactic Lovenox, SCDs, out of bed/ambulate TID, IS,     Access: peripheral IV    Dispo: floor status    Objective   Vital signs over the last 24 hours:  Temp:  [36 ??C (96.8 ??F)-36.9 ??C (98.4 ??F)] 36.8 ??C (98.2 ??F)  Heart Rate:  [90-110] 90  SpO2 Pulse:  [101-110] 105  Resp:  [7-27] 16  BP: (108-139)/(56-86) 108/69  MAP (mmHg):  [75-102] 81  A BP-1: (93-138)/(65-68) 131/65  MAP:  [79 mmHg-90 mmHg] 86 mmHg  SpO2:  [94 %-100 %] 98 %    Physical Exam:  Gen: alert, well appearing, in no acute distress  CV: regular rate, normotensive  Pulm: breathing comfortably on room air  Abd: soft, nondistended and diffusely tender. Surgical incisions approximated with Dermabond, clean and dry. Colostomy stoma pink, moist, nonprolapsed. Surgical JP drain with non purulent serosanguinous fluid  Ext: warm and well perfused bilaterally, no edema     Penni Bombard, MS-4    I attest that I have reviewed the student note and that the components of the history of the present illness, the physical exam, and the assessment and plan documented were performed by me or were performed in my presence by the student where I verified the documentation and performed (or re-performed) the exam and medical decision making.   Ila Mcgill. Hyacinth Meeker MD

## 2020-12-13 NOTE — Unmapped (Signed)
Addendum  created 12/13/20 1532 by Alexis Goodell, DO    Clinical Note Signed, Intraprocedure Event edited

## 2020-12-13 NOTE — Unmapped (Signed)
Brief Operative Note  (CSN: [id number])      Date of Surgery: 12/12/2020    Pre-op Diagnosis: colitis    Post-op Diagnosis: Ulcerative colitis, acute, other complication (CMS-HCC) [K51.918]    Procedure(s):  ROBOTIC XI LAPAROSCOPY, SURGICAL; PROCTECTOMY, COMPLETE, COMBINED ABDOMINOPERINEAL, WITH COLOSTOMY: 45395 (CPT??)  ROBOTIC LAPAROSCOPY, SURGICAL, ENTEROLYSIS (FREEING OF INTESTINAL ADHESION) (SEPARATE PROCEDURE): 44180 (CPT??)  ENTERECTOMY SM INTES; SNGL RESECT & ANASTOM: 44120 (CPT??)  Note: Revisions to procedures should be made in chart - see Procedures activity.    Performing Service: Gastrointestinal  Surgeon(s) and Role:     * Jose Marcha Dutton, MD - Primary     * Lysle Rubens, MD - Resident - Assisting     * Luther Redo, MD - Resident - Assisting     * Benjaman Pott, MD - Resident - Observing    Findings: Proctectomy with J-pouch creation. Ischemic small bowel of distal j-pouch, minimal to no blood supply confirmed with ICG. Resected ischemic small bowel and created end ileostomy. 7 Fr JP left in pelvis.    Anesthesia: General    Estimated Blood Loss: 50 mL    Complications: None    Specimens:   ID Type Source Tests Collected by Time Destination   1 : rectum; ulcerative colitis Tissue Rectum SURGICAL PATHOLOGY EXAM [name], MD 12/12/2020 1436    2 : ileostomy takedown Tissue Abdomen SURGICAL PATHOLOGY EXAM [name], MD 12/12/2020 1602    3 : small intestine Tissue Abdomen SURGICAL PATHOLOGY EXAM [name], MD 12/12/2020 1810        Implants: * No implants in log *    Surgeon Notes: Dr. Miachel Roux was present and scrubbed for the entire procedure    Lysle Rubens   Date: 12/12/2020  Time: 8:30 PM

## 2020-12-13 NOTE — Unmapped (Signed)
Problem: Impaired Wound Healing  Goal: Optimal Wound Healing  Outcome: Progressing     Problem: Adult Inpatient Plan of Care  Goal: Plan of Care Review  Outcome: Progressing  Goal: Patient-Specific Goal (Individualized)  Outcome: Progressing  Goal: Absence of Hospital-Acquired Illness or Injury  Outcome: Progressing  Goal: Optimal Comfort and Wellbeing  Outcome: Progressing  Goal: Readiness for Transition of Care  Outcome: Progressing  Goal: Rounds/Family Conference  Outcome: Progressing     Problem: Fall Injury Risk  Goal: Absence of Fall and Fall-Related Injury  Outcome: Progressing     Problem: Self-Care Deficit  Goal: Improved Ability to Complete Activities of Daily Living  Outcome: Progressing

## 2020-12-14 LAB — CBC
HEMATOCRIT: 39.8 % (ref 39.0–48.0)
HEMOGLOBIN: 13.3 g/dL (ref 12.9–16.5)
MEAN CORPUSCULAR HEMOGLOBIN CONC: 33.3 g/dL (ref 32.0–36.0)
MEAN CORPUSCULAR HEMOGLOBIN: 28.5 pg (ref 25.9–32.4)
MEAN CORPUSCULAR VOLUME: 85.6 fL (ref 77.6–95.7)
MEAN PLATELET VOLUME: 8 fL (ref 6.8–10.7)
PLATELET COUNT: 228 10*9/L (ref 150–450)
RED BLOOD CELL COUNT: 4.65 10*12/L (ref 4.26–5.60)
RED CELL DISTRIBUTION WIDTH: 17 % — ABNORMAL HIGH (ref 12.2–15.2)
WBC ADJUSTED: 12.7 10*9/L — ABNORMAL HIGH (ref 3.6–11.2)

## 2020-12-14 LAB — BASIC METABOLIC PANEL
ANION GAP: 7 mmol/L (ref 5–14)
BLOOD UREA NITROGEN: 6 mg/dL — ABNORMAL LOW (ref 9–23)
BUN / CREAT RATIO: 8
CALCIUM: 8.5 mg/dL — ABNORMAL LOW (ref 8.7–10.4)
CHLORIDE: 105 mmol/L (ref 98–107)
CO2: 29 mmol/L (ref 20.0–31.0)
CREATININE: 0.8 mg/dL
EGFR CKD-EPI (2021) MALE: >90 mL/min/{1.73_m2} (ref >=60)
GLUCOSE RANDOM: 119 mg/dL (ref 70–179)
POTASSIUM: 3.9 mmol/L (ref 3.4–4.8)
SODIUM: 141 mmol/L (ref 135–145)

## 2020-12-14 LAB — MAGNESIUM: MAGNESIUM: 2 mg/dL (ref 1.6–2.6)

## 2020-12-14 LAB — PHOSPHORUS: PHOSPHORUS: 1.7 mg/dL — ABNORMAL LOW (ref 2.4–5.1)

## 2020-12-14 MED ADMIN — melatonin tablet 3 mg: 3 mg | ORAL | @ 03:00:00

## 2020-12-14 MED ADMIN — HYDROmorphone (PF) (DILAUDID) injection 0.5 mg: .5 mg | INTRAVENOUS | @ 03:00:00 | Stop: 2020-12-27

## 2020-12-14 MED ADMIN — ondansetron (ZOFRAN) injection 4 mg: 4 mg | INTRAVENOUS | @ 07:00:00

## 2020-12-14 MED ADMIN — HYDROmorphone (DILAUDID) 50mg/50ml (1mg/ml) PCA CADD: INTRAVENOUS | @ 14:00:00 | Stop: 2020-12-28

## 2020-12-14 MED ADMIN — pregabalin (LYRICA) capsule 100 mg: 100 mg | ORAL | @ 18:00:00

## 2020-12-14 MED ADMIN — HYDROmorphone (PF) (DILAUDID) injection 0.5 mg: .5 mg | INTRAVENOUS | @ 13:00:00 | Stop: 2020-12-27

## 2020-12-14 MED ADMIN — pregabalin (LYRICA) capsule 100 mg: 100 mg | ORAL

## 2020-12-14 MED ADMIN — pantoprazole (PROTONIX) EC tablet 40 mg: 40 mg | ORAL | @ 10:00:00

## 2020-12-14 MED ADMIN — enoxaparin (LOVENOX) syringe 40 mg: 40 mg | SUBCUTANEOUS | @ 16:00:00

## 2020-12-14 MED ADMIN — pregabalin (LYRICA) capsule 100 mg: 100 mg | ORAL | @ 13:00:00

## 2020-12-14 MED ADMIN — HYDROcodone-acetaminophen (NORCO) 5-325 mg per tablet 2 tablet: 2 | ORAL | @ 07:00:00 | Stop: 2020-12-27

## 2020-12-14 MED ADMIN — HYDROmorphone (PF) (DILAUDID) injection 0.5 mg: .5 mg | INTRAVENOUS | @ 21:00:00 | Stop: 2020-12-27

## 2020-12-14 MED ADMIN — potassium phosphate 21 mmol in sodium chloride (NS) 0.9 % 250 mL infusion: 21 mmol | INTRAVENOUS | @ 16:00:00 | Stop: 2020-12-14

## 2020-12-14 MED ADMIN — lactated Ringers infusion: 50 mL/h | INTRAVENOUS | @ 13:00:00

## 2020-12-14 MED ADMIN — HYDROmorphone (PF) (DILAUDID) injection 0.5 mg: .5 mg | INTRAVENOUS | @ 11:00:00 | Stop: 2020-12-27

## 2020-12-14 NOTE — Unmapped (Signed)
Operative Note    Date of Service: 12/12/20    Preoperative Diagnosis: CUC s/p Robotic TAC, Hartman pouch and EI    Postoperative Diagnosis: same    Procedure Performed:   1) Takedown of end ileostomy  2) Robotic, laparoscopic lysis of adhesions (>60 minutes)  3) Robotic Cattell Maneuver  4) Robotic Low anterior resection   5) Re creation of end ileostomy    Teaching Surgeon: Dr. Rogene Houston    Resident Surgeon: Allene Dillon, Lysle Rubens    Anesthesia: GAN      Specimens: Rectum, ileostomy takedown and small intestine    Implants: none    Estimated Blood Loss: 50mL    Drains: 1 JP 7mm    Complications: Ischemic segment of distal ileum @ 20cm    Indications for Surgery: CUC, s/p Robotic TAC and IRA with EI.    Operative Findings: some adhesions of small bowel mesentery to retroperitoneum and stump of IMA/SR to sacral promontory.    Procedure Details:     Following GEA, the patient was placed in lithotomy position. The EI was closed with a 2-0 silk purse string stitch in order to prevent spilling. Suture was left long for future intraabdominal retrieval.    Following standard prep and drape, we released the EI from its mucocutaneous junction and fascial attachments. It was delivered intraabdominaly and a 12 mm Robotic port and small wound protector used to generate a pneumoperitoneum. Once this was established and a camera used to explore abdominal cavity, 5mm and 8 mm Robotic ports as well as Airseal port were placed in left and right side of abdomen in preparation for mobilization of SM down to pelvis as well as proctectomy.    With the patient in trendelenburg and right side position our attention was directed to releasing the retroperitoneal attachments of small bowel around Ligament of triez and duodenum. This was continued in a Cattell like maneuver till small bowel was freed off the retroperitoneum. This was very tedious and time consuming surgery.    We then re-docked the Robot and targeted to the pelvis and performed a TME based proctectomy down to the upper part of anorectal muscular ring. The level of the distal transection was confirmed my manual DRE. This part of the procedure was tedious as the IMA and its distal continuation, namely the SR were adherent to the retroperitoneum at the level of sacral promontory and required careful time consuming enterolysis and identification of surrounding vital structures.  The distal rectum was transected with multiple fires of the Robotic Stapler in an uneventful manner.    We undocked the robot and via the prior Pfannenstiel incision entered the abdomen and extracted the specimen. The small intestine was delivered via the ileostomy takedown site and volar and dorsal mesenteric scoring incisions were made for lengthening purposes. A 15 cm limb of small intestine was measured back from the cut edge and used to tailor a J-Pouch configuration which was help in place with 3 3-0 silk antimesenteric serosal stiches. In order to obtain further length to assure a tension free anastomosis, a mesenteric window was created between the inner and outer arcade of mesenteric vessels using 3-0 silk ties.     A 2-0 prolene purse string stitch was placed at apex of J Pouch and using cautery an enterotomy was created in the center of purse string and the anvil of an 28 mm EEA was inserted into the apex of pouch and secured in place with the prolene purse string  stitch.    Upon delivery of the pouch into pelvis via the pfennensteil incision a small arterial bleeder was encountered along one of the marginal vessels of the mesenteric window. This was easily controlled with cautery. We delivered the pouch into pelvis and redocked in order to perform the anastomosis. Under directi visualization, one limb of the pouch appeared slightly dusky. We re delivered the pouch out of the abdomen and using ICG were able to demonstrate insufficient blood flow to that one limb. Given the progressively worsening and demarcating ischemic appearance of one limb of pouch along with poor flow based upon ICG imagine, I decided that it was not safe to connect the pouch since it would have become fully ischemic and necrotic. We excised the J pouch flushed to the edge of demarcation taken care to preserve as much SB as possible.    The abdomen was irrigated, the ports were removed under direction vision and pfennnesteil fascial incision closed with 0 PDS and skin with 3-0 nylon vertical mattress. 8 mm port sites were closed with 4-0 monocryl and dermabond. A 7mm JP was placed deep in pelvis and brought out through the left Robotic port site.     The end ileostomy was brought out through prior ileostomy site and matured in a Brooke fashion using 4-o chromic suture. The subcutaneous edges and skin were approximated with 2-0 vycryl and 3-0 nylon in order to help with brooking.    An appliance was placed over stoma.    EBL: 50 ml    Count correct X 2    Patient was stable throughout.    Wound protectors were used and gloves were changed prior to closure of fascia.         I was present for entire procedure.

## 2020-12-14 NOTE — Unmapped (Signed)
Patient is A&Ox4 Surgical sites CDI and OTA. Pain is well controlled with PRN pain medication.  All medications given as ordered. No falls or injury noted, bed in lowest position/locked, and call bell within reach. Will continue to monitor and assist Pt as needed.    Problem: Impaired Wound Healing  Goal: Optimal Wound Healing  Outcome: Progressing     Problem: Adult Inpatient Plan of Care  Goal: Plan of Care Review  Outcome: Progressing  Goal: Patient-Specific Goal (Individualized)  Outcome: Progressing  Goal: Absence of Hospital-Acquired Illness or Injury  Outcome: Progressing  Goal: Optimal Comfort and Wellbeing  Outcome: Progressing  Goal: Readiness for Transition of Care  Outcome: Progressing  Goal: Rounds/Family Conference  Outcome: Progressing     Problem: Fall Injury Risk  Goal: Absence of Fall and Fall-Related Injury  Outcome: Progressing     Problem: Self-Care Deficit  Goal: Improved Ability to Complete Activities of Daily Living  Outcome: Progressing

## 2020-12-14 NOTE — Unmapped (Signed)
VSS  LR @  PCA started during shift  Patient worked with PT/OT  Patent relaxing in chair and call light within reach    Problem: Impaired Wound Healing  Goal: Optimal Wound Healing  Outcome: Progressing     Problem: Adult Inpatient Plan of Care  Goal: Plan of Care Review  Outcome: Progressing  Goal: Patient-Specific Goal (Individualized)  Outcome: Progressing  Goal: Absence of Hospital-Acquired Illness or Injury  Outcome: Progressing  Goal: Optimal Comfort and Wellbeing  Outcome: Progressing  Goal: Readiness for Transition of Care  Outcome: Progressing  Goal: Rounds/Family Conference  Outcome: Progressing     Problem: Fall Injury Risk  Goal: Absence of Fall and Fall-Related Injury  Outcome: Progressing     Problem: Self-Care Deficit  Goal: Improved Ability to Complete Activities of Daily Living  Outcome: Progressing

## 2020-12-14 NOTE — Unmapped (Signed)
Lower Gastrointestinal Surgery Progress Note    Today's Date: 12/14/2020  Admission Date: 12/12/2020  Length of Stay: Day 2, 2 Days Post-Op  Hospital Service: Henreitta Leber [facility])  Attending Physician: [name], MD     Assessment   Principal Problem:    Ulcerative colitis, acute, other complication (CMS-HCC)    Kevin Tran is a 60 y.o. male with a medical history significant for UC who is s/p robotic, laparoscopic assisted total abdominal colectomy with end Ileostomy??on 06/29/20 who was admitted to the hospital on 12/12/20 for a planned Ileoanal anastomosis(J-pouch surgery) which was converted to an end ileostomy due to concern for poor perfusion of the J-Pouch intraoperatively.     Subjective   NAEON. AVSS, Pain well controlled, Foley in place for UOP monitoring, AROBF. Abdomen mildly distended this AM. Drain with 450 output from 130. Ostomy with 50 output from 0. Required PRN pain medication throughout day yesterday. Pt still endorsing significant pain.    Plan   Neuro/Psych:  -sched lyrica, PRN Norco and IV diluadid, add PCA, nausea medications as needed    CV/Pulm:  -HDS, oxygenating well on RA.     FEN/GI:  -IVF: LR 50 ml/hr  -diet: Nutrition Therapy Clear Liquid on PPI, okay for crackers or snack with pain medication  -strict I/O's  -replete phos  - if needs more fluid, use albumin bolus    *s/p end ileostomy 11/1  - JP drain Cr testing    Renal/GU:  - DC foley, TOV    Heme/ID:  -H&H stable, no active signs/symptoms of bleeding    Prophylaxis: prophylactic Lovenox, SCDs, out of bed/ambulate TID, IS    Access: peripheral IV    Dispo: floor status    Objective   Vital signs over the last 24 hours:  Temp:  [36.6 ??C (97.9 ??F)-37.3 ??C (99.1 ??F)] 36.6 ??C (97.9 ??F)  Heart Rate:  [88-104] 101  Resp:  [16-19] 18  BP: (108-126)/(69-83) 110/80  MAP (mmHg):  [81-95] 90  SpO2:  [94 %-98 %] 94 %    Physical Exam:  Gen: alert, well appearing, in no acute distress  CV: regular rate, normotensive  Pulm: breathing comfortably on room air  Abd: soft, nondistended and diffusely tender. Surgical incisions approximated with Dermabond, clean and dry. Colostomy stoma pink, moist, nonprolapsed. Surgical JP drain with non purulent serosanguinous fluid  Ext: warm and well perfused bilaterally, no edema

## 2020-12-14 NOTE — Unmapped (Signed)
WOCN Consult Services  OSTOMY VISIT NOTE     This patient was seen in coordination and with supervision of Renaye Rakers [facility]    Reason for Consult:   - Follow-up  - Ostomy Care  - Ostomy Teaching    Problem List:   Principal Problem:    Ulcerative colitis, acute, other complication (CMS-HCC)    Assessment: Per EMR- Kevin Tran is a 60 y.o. male with a medical history significant for UC who is s/p??robotic, laparoscopic assisted total abdominal colectomy with end Ileostomy??on 06/29/20 who was admitted to the hospital on 12/12/20 for a planned Ileoanal anastomosis(J-pouch surgery) which was converted to an end ileostomy due to concern for poor perfusion of the J-Pouch intraoperatively.     CWOCN followed up for continued for ostomy teaching. Pt's wife at the bedside today. Pt was sitting comfortably in the chair today and participated in teaching today.     Pouch that was placed yesterday remains intact. Pt has no complaints of burning or itching under skin. Pouch left in place today.     Discussed concealment options, diet, hydration, hygiene, and when to contact WOCN line vs surgeons office. Plan to return tomorrow for continued teaching.     Stoma Type:  -  Ileostomy Stoma Location:  - RLQ (Right Lower Quadrant)     Stoma Characteristics:  - Not able to assess at this time, pouch not removed Stoma Mucosal Condition and Color:  - Not able to assess at this time, pouch not removed     Mucocutaneous Junction:  - Intact    Rod/Stents:  - No     Output:  - Brown  - Green  - Liquid     Peristomal Skin Condition:   - -incisions next to stoma with sutures       Abdominal Contours:  - Distended  - Soft    Pouching System:  - 1 Piece  - CTF (Cut to fit)  - soft convex Anticipated Wear Time of Pouching System:  - To be determined     Teaching Limitations/Considerations:   - none    Teaching/Instructions:  - Discussed agreed upon goals that the patient/family member would be able to empty and change ostomy pouch, have the instructions needed to order home supplies and know how to trouble shoot pouch leakage and common peristomal skin problems.  - Discussed and demonstrated pouch emptying technique and pouch burping technique to empty flatus from pouch.  - Patient given instruction and provided demonstration of ostomy care including removal of old pouch, cleansing of the peristomal skin, measuring the stoma, cutting out/shaping the wafer, applying moldable barrier ring, and application of wafer/pouch to skin.  - Discussed and demonstrated the technique of crusting for peristomal skin irritation.  - Discussed anticipated pouch change frequency and need to change with any signs the pouch may be leaking.  - Discussed activities of daily living, bathing, diet and preventive measures regarding dehydration.  - Reviewed how to measure and record output and to contact surgeon if having greater than 1.5 liters of output in a 24 hour period.  - Reviewed when to contact the outpatient WOC nurse with questions/concerns.  - Discussed concealment options/clothing modifications and ostomy accessory products.    Ostomy Home Starter Kit - Verbal Consent Obtained:  - Not discussed    Recommendations/Plan:   - Patient will need more ostomy teaching prior to discharge, WOC nurse will continue to follow.  - WOC nurse will follow up in 1 days  for pouch evaluation.  - Pending discharge ostomy supply list.    Ostomy Discharge Goals:  - More teaching needed prior to discharge.     Recommended Consults:   - Case Management Plan of Care Discussed With:  - Patient  - Family     Ostomy Supplies:   - In-patient supplies at the bedside.  - Supplies available on unit.    Ostomy Product List:  Hollister 1-Piece Soft Convex CTF Pouch 1 1/2 [id number] / [phone])  Copywriter, advertising- (052951/120307)  Casey Burkitt Adhesive Remover Wipes- [id number] / [phone])  Marathon Skin Protectant- ([id number])  87M No-Sting Barrier Film- Pads- (050338/3344)  87M No-Sting Barrier Film- Spray- ([id number]/3346)  Hollister Stoma Powder- (050829/7906)  Hollister Ostomy [place] 23-43- (050984/7300)  Naval architect Strip- ([id number]/[id number])     WOCN Follow-up:  - tomorrow    Workup Time:  60 minutes     Donalynn Furlong RN, BSN   [employer] Consult Team  P: [phone]

## 2020-12-15 LAB — CBC
HEMATOCRIT: 38.3 % — ABNORMAL LOW (ref 39.0–48.0)
HEMOGLOBIN: 12.9 g/dL (ref 12.9–16.5)
MEAN CORPUSCULAR HEMOGLOBIN CONC: 33.5 g/dL (ref 32.0–36.0)
MEAN CORPUSCULAR HEMOGLOBIN: 28.7 pg (ref 25.9–32.4)
MEAN CORPUSCULAR VOLUME: 85.6 fL (ref 77.6–95.7)
MEAN PLATELET VOLUME: 7.9 fL (ref 6.8–10.7)
PLATELET COUNT: 246 10*9/L (ref 150–450)
RED BLOOD CELL COUNT: 4.48 10*12/L (ref 4.26–5.60)
RED CELL DISTRIBUTION WIDTH: 16.1 % — ABNORMAL HIGH (ref 12.2–15.2)
WBC ADJUSTED: 10.7 10*9/L (ref 3.6–11.2)

## 2020-12-15 LAB — BASIC METABOLIC PANEL
ANION GAP: 8 mmol/L (ref 5–14)
BLOOD UREA NITROGEN: 7 mg/dL — ABNORMAL LOW (ref 9–23)
BUN / CREAT RATIO: 8
CALCIUM: 9.2 mg/dL (ref 8.7–10.4)
CHLORIDE: 100 mmol/L (ref 98–107)
CO2: 32 mmol/L — ABNORMAL HIGH (ref 20.0–31.0)
CREATININE: 0.86 mg/dL
EGFR CKD-EPI (2021) MALE: >90 mL/min/{1.73_m2} (ref >=60)
GLUCOSE RANDOM: 115 mg/dL (ref 70–179)
POTASSIUM: 4.1 mmol/L (ref 3.4–4.8)
SODIUM: 140 mmol/L (ref 135–145)

## 2020-12-15 LAB — PHOSPHORUS: PHOSPHORUS: 2.9 mg/dL (ref 2.4–5.1)

## 2020-12-15 LAB — MAGNESIUM: MAGNESIUM: 1.9 mg/dL (ref 1.6–2.6)

## 2020-12-15 MED ADMIN — calcium carbonate (TUMS) chewable tablet 200 mg of elem calcium: 200 mg | ORAL | @ 12:00:00

## 2020-12-15 MED ADMIN — pantoprazole (PROTONIX) EC tablet 40 mg: 40 mg | ORAL | @ 09:00:00 | Stop: 2020-12-15

## 2020-12-15 MED ADMIN — ondansetron (ZOFRAN) injection 4 mg: 4 mg | INTRAVENOUS | @ 13:00:00

## 2020-12-15 MED ADMIN — HYDROmorphone (PF) (DILAUDID) injection 0.5 mg: .5 mg | INTRAVENOUS | @ 22:00:00 | Stop: 2020-12-27

## 2020-12-15 MED ADMIN — HYDROcodone-acetaminophen (NORCO) 5-325 mg per tablet 2 tablet: 2 | ORAL | @ 13:00:00 | Stop: 2020-12-27

## 2020-12-15 MED ADMIN — pregabalin (LYRICA) capsule 100 mg: 100 mg | ORAL | @ 17:00:00

## 2020-12-15 MED ADMIN — prochlorperazine (COMPAZINE) injection 5 mg: 5 mg | INTRAVENOUS | @ 15:00:00

## 2020-12-15 MED ADMIN — calcium carbonate (TUMS) chewable tablet 200 mg of elem calcium: 200 mg | ORAL | @ 02:00:00

## 2020-12-15 MED ADMIN — enoxaparin (LOVENOX) syringe 40 mg: 40 mg | SUBCUTANEOUS | @ 13:00:00

## 2020-12-15 MED ADMIN — lidocaine (LIDODERM) 5 % patch 2 patch: 2 | TRANSDERMAL | @ 15:00:00

## 2020-12-15 MED ADMIN — dextrose 5 % and sodium chloride 0.45 % infusion: 100 mL/h | INTRAVENOUS | @ 13:00:00

## 2020-12-15 MED ADMIN — dextrose 5 % and sodium chloride 0.45 % with KCl 20 mEq/L infusion: 0-1000 mL/h | INTRAVENOUS | @ 16:00:00

## 2020-12-15 MED ADMIN — pregabalin (LYRICA) capsule 100 mg: 100 mg | ORAL | @ 02:00:00

## 2020-12-15 MED ADMIN — calcium carbonate (TUMS) chewable tablet 200 mg of elem calcium: 200 mg | ORAL | @ 07:00:00

## 2020-12-15 MED ADMIN — HYDROmorphone (PF) (DILAUDID) injection 0.5 mg: .5 mg | INTRAVENOUS | @ 13:00:00 | Stop: 2020-12-15

## 2020-12-15 MED ADMIN — pregabalin (LYRICA) capsule 100 mg: 100 mg | ORAL | @ 12:00:00

## 2020-12-15 MED ADMIN — melatonin tablet 3 mg: 3 mg | ORAL | @ 02:00:00

## 2020-12-15 MED ADMIN — HYDROmorphone (PF) (DILAUDID) injection 0.5 mg: .5 mg | INTRAVENOUS | @ 17:00:00 | Stop: 2020-12-27

## 2020-12-15 NOTE — Unmapped (Signed)
Addendum  created 12/15/20 1328 by Raymon Mutton, MD    Clinical Note Signed, Intraprocedure Blocks edited

## 2020-12-15 NOTE — Unmapped (Signed)
WOCN Consult Services  OSTOMY VISIT NOTE     This patient was seen in coordination and with supervision of Renaye Rakers [facility]    Reason for Consult:   - Follow-up  - Ostomy Care  - Ostomy Teaching    Problem List:   Principal Problem:    Ulcerative colitis, acute, other complication (CMS-HCC)    Assessment: Per EMR- Kevin Tran is a 60 y.o. male with a medical history significant for UC who is s/p??robotic, laparoscopic assisted total abdominal colectomy with end Ileostomy??on 06/29/20 who was admitted to the hospital on 12/12/20 for a planned Ileoanal anastomosis(J-pouch surgery) which was converted to an end ileostomy due to concern for poor perfusion of the J-Pouch intraoperatively.     CWOCN followed up for continued for ostomy teaching. Upon entering room, pt had just had NG tube placed. Pt reports feeling nauseous. Pt and wife asked to defer teaching for today. Left information for hernia belt at the bedside. Will put on for follow up tomorrow. Pouch from 11/2 remains intact.     Will continue to follow.     Stoma Type:  -  Ileostomy Stoma Location:  - RLQ (Right Lower Quadrant)     Stoma Characteristics:  - Not able to assess at this time, pouch not removed Stoma Mucosal Condition and Color:  - Not able to assess at this time, pouch not removed     Mucocutaneous Junction:  - Intact    Rod/Stents:  - No     Output:  - Brown  - Green  - Liquid     Peristomal Skin Condition:   - -incisions next to stoma with sutures     Abdominal Contours:  - Distended  - Soft    Pouching System:  - 1 Piece  - CTF (Cut to fit)  - soft convex Anticipated Wear Time of Pouching System:  - To be determined     Teaching Limitations/Considerations:   - none    Teaching/Instructions:  - Discussed agreed upon goals that the patient/family member would be able to empty and change ostomy pouch, have the instructions needed to order home supplies and know how to trouble shoot pouch leakage and common peristomal skin problems.  - Discussed and demonstrated pouch emptying technique and pouch burping technique to empty flatus from pouch.  - Patient given instruction and provided demonstration of ostomy care including removal of old pouch, cleansing of the peristomal skin, measuring the stoma, cutting out/shaping the wafer, applying moldable barrier ring, and application of wafer/pouch to skin.  - Discussed and demonstrated the technique of crusting for peristomal skin irritation.  - Discussed anticipated pouch change frequency and need to change with any signs the pouch may be leaking.  - Discussed activities of daily living, bathing, diet and preventive measures regarding dehydration.  - Reviewed how to measure and record output and to contact surgeon if having greater than 1.5 liters of output in a 24 hour period.  - Reviewed when to contact the outpatient WOC nurse with questions/concerns.  - Discussed concealment options/clothing modifications and ostomy accessory products.    Ostomy Home Starter Kit - Verbal Consent Obtained:  - Not discussed    Recommendations/Plan:   - Patient will need more ostomy teaching prior to discharge, WOC nurse will continue to follow.  - WOC nurse will follow up in 1 days for pouch evaluation.  - Pending discharge ostomy supply list.    Ostomy Discharge Goals:  - More teaching needed prior  to discharge.     Recommended Consults:   - Case Management Plan of Care Discussed With:  - Patient  - Family     Ostomy Supplies:   - In-patient supplies at the bedside.  - Supplies available on unit.    Ostomy Product List:  Hollister 1-Piece Soft Convex CTF Pouch 1 1/2 [phone] / [phone])  Copywriter, advertising- (052951/120307)  Casey Burkitt Adhesive Remover Wipes- [phone] / [phone])  Marathon Skin Protectant- ([id number])  65M No-Sting Barrier Film- Pads- (050338/3344)  65M No-Sting Barrier Film- Spray- (050858/3346)  Hollister Stoma Powder- (050829/7906)  Hollister Ostomy [place] 23-43- (050984/7300)  Naval architect Strip- ([id number])     WOCN Follow-up:  - tomorrow    Workup Time:  30 minutes     Donalynn Furlong RN, BSN   [employer] Consult Team  P: [phone]

## 2020-12-15 NOTE — Unmapped (Signed)
VSS.  A&Ox4.  Foley in place with adequate urine output.  Continues to complain of heartburn and feeling of indigestion, minimal relieve with tumbs and protonix.        Problem: Adult Inpatient Plan of Care  Goal: Plan of Care Review  Outcome: Ongoing - Unchanged  Goal: Optimal Comfort and Wellbeing  Outcome: Ongoing - Unchanged     Problem: Self-Care Deficit  Goal: Improved Ability to Complete Activities of Daily Living  Outcome: Ongoing - Unchanged     Problem: Impaired Wound Healing  Goal: Optimal Wound Healing  Outcome: Progressing     Problem: Adult Inpatient Plan of Care  Goal: Patient-Specific Goal (Individualized)  Outcome: Progressing  Goal: Absence of Hospital-Acquired Illness or Injury  Outcome: Progressing  Goal: Readiness for Transition of Care  Outcome: Progressing  Goal: Rounds/Family Conference  Outcome: Progressing     Problem: Fall Injury Risk  Goal: Absence of Fall and Fall-Related Injury  Outcome: Progressing

## 2020-12-15 NOTE — Unmapped (Signed)
Lower Gastrointestinal Surgery Progress Note    Today's Date: 12/15/2020  Admission Date: 12/12/2020  Length of Stay: Day 3, 3 Days Post-Op  Hospital Service: Henreitta Leber [facility])  Attending Physician: [name], MD     Assessment   Principal Problem:    Ulcerative colitis, acute, other complication (CMS-HCC)    Kevin Tran is a 60 y.o. male with a medical history significant for UC who is s/p robotic, laparoscopic assisted total abdominal colectomy with end Ileostomy??on 06/29/20 who was admitted to the hospital on 12/12/20 for a planned Ileoanal anastomosis(J-pouch surgery) which was converted to an end ileostomy due to concern for poor perfusion of the J-Pouch intraoperatively.     Subjective   Severe indigestion and episode of emesis this AM. Having thin ostomy output.    Plan      -sched lyrica, PRN Norco and IV dilaudid  - Discontinue dPCA  - IVF @ 75  - Continue JP drain  - NPO w sips  - Discontinue foley  - IV PPI BID  - KUB    Prophylaxis: prophylactic Lovenox, SCDs, out of bed/ambulate TID, IS    Access: peripheral IV    Dispo: floor status    Objective   Vital signs over the last 24 hours:  Temp:  [36.6 ??C (97.9 ??F)-37.2 ??C (99 ??F)] 36.6 ??C (97.9 ??F)  Heart Rate:  [88-101] 90  Resp:  [17-18] 18  BP: (110-132)/(72-80) 132/79  MAP (mmHg):  [84-93] 93  SpO2:  [94 %-98 %] 98 %    Physical Exam:  Gen: alert, well appearing, in no acute distress  CV: regular rate, normotensive  Pulm: breathing comfortably on room air  Abd: soft, nondistended and diffusely tender. Surgical incisions approximated with Dermabond, clean and dry. Colostomy stoma pink, moist, nonprolapsed. Surgical JP drain with non purulent serosanguinous fluid. Pfannenstiel incision approximated with sutures  Ext: warm and well perfused bilaterally, no edema

## 2020-12-16 LAB — CBC
HEMATOCRIT: 36 % — ABNORMAL LOW (ref 39.0–48.0)
HEMOGLOBIN: 12.2 g/dL — ABNORMAL LOW (ref 12.9–16.5)
MEAN CORPUSCULAR HEMOGLOBIN CONC: 34 g/dL (ref 32.0–36.0)
MEAN CORPUSCULAR HEMOGLOBIN: 28.7 pg (ref 25.9–32.4)
MEAN CORPUSCULAR VOLUME: 84.3 fL (ref 77.6–95.7)
MEAN PLATELET VOLUME: 7.8 fL (ref 6.8–10.7)
PLATELET COUNT: 280 10*9/L (ref 150–450)
RED BLOOD CELL COUNT: 4.27 10*12/L (ref 4.26–5.60)
RED CELL DISTRIBUTION WIDTH: 15.4 % — ABNORMAL HIGH (ref 12.2–15.2)
WBC ADJUSTED: 8.6 10*9/L (ref 3.6–11.2)

## 2020-12-16 LAB — BASIC METABOLIC PANEL
ANION GAP: 9 mmol/L (ref 5–14)
BLOOD UREA NITROGEN: 8 mg/dL — ABNORMAL LOW (ref 9–23)
BUN / CREAT RATIO: 9
CALCIUM: 9.1 mg/dL (ref 8.7–10.4)
CHLORIDE: 99 mmol/L (ref 98–107)
CO2: 31 mmol/L (ref 20.0–31.0)
CREATININE: 0.86 mg/dL
EGFR CKD-EPI (2021) MALE: >90 mL/min/{1.73_m2} (ref >=60)
GLUCOSE RANDOM: 123 mg/dL (ref 70–179)
POTASSIUM: 3.5 mmol/L (ref 3.4–4.8)
SODIUM: 139 mmol/L (ref 135–145)

## 2020-12-16 LAB — MAGNESIUM: MAGNESIUM: 1.9 mg/dL (ref 1.6–2.6)

## 2020-12-16 LAB — PHOSPHORUS: PHOSPHORUS: 3.2 mg/dL (ref 2.4–5.1)

## 2020-12-16 MED ADMIN — acetaminophen (OFIRMEV) 10 mg/mL injection 1,000 mg: 1000 mg | INTRAVENOUS | @ 18:00:00 | Stop: 2020-12-16

## 2020-12-16 MED ADMIN — loratadine (CLARITIN) tablet 10 mg: 10 mg | ORAL | @ 23:00:00

## 2020-12-16 MED ADMIN — HYDROmorphone (PF) (DILAUDID) injection 0.5 mg: .5 mg | INTRAVENOUS | @ 19:00:00 | Stop: 2020-12-27

## 2020-12-16 MED ADMIN — potassium chloride 10 mEq in 100 mL IVPB: 10 meq | INTRAVENOUS | @ 16:00:00 | Stop: 2020-12-16

## 2020-12-16 MED ADMIN — pregabalin (LYRICA) capsule 100 mg: 100 mg | ORAL | @ 18:00:00

## 2020-12-16 MED ADMIN — pantoprazole (PROTONIX) injection 40 mg: 40 mg | INTRAVENOUS | @ 13:00:00

## 2020-12-16 MED ADMIN — melatonin tablet 3 mg: 3 mg | ORAL | @ 05:00:00

## 2020-12-16 MED ADMIN — potassium chloride 10 mEq in 100 mL IVPB: 10 meq | INTRAVENOUS | @ 14:00:00 | Stop: 2020-12-16

## 2020-12-16 MED ADMIN — methocarbamoL (ROBAXIN) tablet 500 mg: 500 mg | ORAL | @ 22:00:00

## 2020-12-16 MED ADMIN — methocarbamoL (ROBAXIN) tablet 500 mg: 500 mg | ORAL | @ 18:00:00

## 2020-12-16 MED ADMIN — HYDROmorphone (PF) (DILAUDID) injection 0.5 mg: .5 mg | INTRAVENOUS | @ 05:00:00 | Stop: 2020-12-27

## 2020-12-16 MED ADMIN — enoxaparin (LOVENOX) syringe 40 mg: 40 mg | SUBCUTANEOUS | @ 13:00:00

## 2020-12-16 MED ADMIN — acetaminophen (OFIRMEV) 10 mg/mL injection 1,000 mg: 1000 mg | INTRAVENOUS | @ 13:00:00 | Stop: 2020-12-16

## 2020-12-16 MED ADMIN — pregabalin (LYRICA) capsule 100 mg: 100 mg | ORAL | @ 13:00:00

## 2020-12-16 MED ADMIN — pantoprazole (PROTONIX) injection 40 mg: 40 mg | INTRAVENOUS | @ 01:00:00

## 2020-12-16 MED ADMIN — dextrose 5 % and sodium chloride 0.45 % with KCl 20 mEq/L infusion: 0-1000 mL/h | INTRAVENOUS | @ 21:00:00

## 2020-12-16 MED ADMIN — lidocaine (LIDODERM) 5 % patch 2 patch: 2 | TRANSDERMAL | @ 13:00:00

## 2020-12-16 MED ADMIN — potassium chloride 10 mEq in 100 mL IVPB: 10 meq | INTRAVENOUS | @ 15:00:00 | Stop: 2020-12-16

## 2020-12-16 MED ADMIN — HYDROmorphone (PF) (DILAUDID) injection 0.5 mg: .5 mg | INTRAVENOUS | @ 08:00:00 | Stop: 2020-12-27

## 2020-12-16 MED ADMIN — HYDROmorphone (PF) (DILAUDID) injection 0.5 mg: .5 mg | INTRAVENOUS | @ 01:00:00 | Stop: 2020-12-27

## 2020-12-16 MED ADMIN — potassium chloride 10 mEq in 100 mL IVPB: 10 meq | INTRAVENOUS | @ 19:00:00 | Stop: 2020-12-16

## 2020-12-16 MED ADMIN — HYDROmorphone (PF) (DILAUDID) injection 0.5 mg: .5 mg | INTRAVENOUS | @ 23:00:00 | Stop: 2020-12-27

## 2020-12-16 MED ADMIN — dextrose 5 % and sodium chloride 0.45 % with KCl 20 mEq/L infusion: 0-1000 mL/h | INTRAVENOUS | @ 16:00:00

## 2020-12-16 MED ADMIN — HYDROmorphone (PF) (DILAUDID) injection 0.5 mg: .5 mg | INTRAVENOUS | @ 13:00:00 | Stop: 2020-12-27

## 2020-12-16 NOTE — Unmapped (Signed)
Primary care nurse placed piv. Vat not needed at this time.

## 2020-12-16 NOTE — Unmapped (Signed)
Patient with nausea and vomiting this shift. NG tube  placed per orders with large amount dark green , brown drainage noted. Pt with abdominal pain this shift see PRN medications given this shift. Foley Cath removed this shift. Pt. Remains on TOV however voiding see charted bladder scanner and voids. Fluid replacement this shift see MAR for NG output. Wife at [facility] this shift.        Problem: Impaired Wound Healing  Goal: Optimal Wound Healing  Outcome: Ongoing - Unchanged     Problem: Adult Inpatient Plan of Care  Goal: Plan of Care Review  Outcome: Ongoing - Unchanged  Goal: Patient-Specific Goal (Individualized)  Outcome: Ongoing - Unchanged  Goal: Absence of Hospital-Acquired Illness or Injury  Outcome: Ongoing - Unchanged  Goal: Optimal Comfort and Wellbeing  Outcome: Ongoing - Unchanged     Problem: Fall Injury Risk  Goal: Absence of Fall and Fall-Related Injury  Outcome: Ongoing - Unchanged     Problem: Self-Care Deficit  Goal: Improved Ability to Complete Activities of Daily Living  Outcome: Ongoing - Unchanged

## 2020-12-16 NOTE — Unmapped (Signed)
Lower Gastrointestinal Surgery Progress Note    Today's Date: 12/16/2020  Admission Date: 12/12/2020  Length of Stay: Day 4, 4 Days Post-Op  Hospital Service: Henreitta Leber [facility])  Attending Physician: [name], MD     Assessment   Principal Problem:    Ulcerative colitis, acute, other complication (CMS-HCC)    Kevin Tran is a 60 y.o. male with a medical history significant for UC who is s/p robotic, laparoscopic assisted total abdominal colectomy with end Ileostomy??on 06/29/20 who was admitted to the hospital on 12/12/20 for a planned Ileoanal anastomosis(J-pouch surgery) which was converted to an end ileostomy due to concern for poor perfusion of the J-Pouch intraoperatively.     Subjective   High NGT output of 3.6L. Complains of poor pain control. Overall unhappy with his care at [facility].    Plan      -sched lyrica, PRN Norco and IV dilaudid  - Increase IVF to 125  - Increase frequency of 0.5:1 replacements to q6h  - Continue JP drain  - NPO, ok for ice chips   - Strict I&Os, must subtract ice intake from recorded NGT output  - IV PPI BID  - Robaxin  - IV tylenol    Prophylaxis: prophylactic Lovenox, SCDs, out of bed/ambulate TID, IS    Access: peripheral IV    Dispo: floor status    Objective   Vital signs over the last 24 hours:  Temp:  [36.5 ??C (97.7 ??F)-37 ??C (98.6 ??F)] 36.5 ??C (97.7 ??F)  Heart Rate:  [78-90] 78  Resp:  [16-18] 16  BP: (116-132)/(72-82) 116/72  MAP (mmHg):  [85-97] 85  SpO2:  [95 %-98 %] 96 %    Physical Exam:  Gen: alert, well appearing, in no acute distress, NGT to bilious output  CV: regular rate, normotensive  Pulm: breathing comfortably on room air  Abd: soft, nondistended and diffusely tender. Surgical incisions approximated with Dermabond, clean and dry. Colostomy stoma pink, moist, nonprolapsed. Surgical JP drain with non purulent serosanguinous fluid. Pfannenstiel incision approximated with sutures  Ext: warm and well perfused bilaterally, no edema

## 2020-12-16 NOTE — Unmapped (Signed)
WOCN Consult Services  OSTOMY VISIT NOTE       Reason for Consult:   - Follow-up  - Ileostomy  - Ostomy Care  - Ostomy Teaching    Problem List:   Principal Problem:    Ulcerative colitis, acute, other complication (CMS-HCC)    Assessment: Per EMR- Kevin Tran is a 60 y.o. male with a medical history significant for UC who is s/p??robotic, laparoscopic assisted total abdominal colectomy with end Ileostomy??on 06/29/20 who was admitted to the hospital on 12/12/20 for a planned Ileoanal anastomosis(J-pouch surgery) which was converted to an end ileostomy due to concern for poor perfusion of the J-Pouch intraoperatively.     CWOCN followed up for continued for ostomy teaching. Met with patient and his wife.  Wife completed pouch change with new 1 piece CTF convex pouch and ring.  There was no wear on previous pouch and they like this system.  Supplies for discharge obtained and wife reports that she can care for his ostomy bc she has in the past.    Will continue to follow.     Stoma Type:  -  Ileostomy Stoma Location:  - RLQ (Right Lower Quadrant)     Stoma Characteristics:  - Oval  - Budded  - Minimal height  - Os location 12oclock skin level Stoma Mucosal Condition and Color:  - Moist  - Pink     Mucocutaneous Junction:  - Intact    Rod/Stents:  - No     Output:  - Brown  - Green  - Liquid     Peristomal Skin Condition:   - -incisions next to stoma with sutures at 3 and 9oclock     Abdominal Contours:  - Distended  - Soft    Pouching System:  - 1 Piece  - CTF (Cut to fit)  - soft convex with barrier ring Anticipated Wear Time of Pouching System:  - To be determined     Teaching Limitations/Considerations:   - none    Teaching/Instructions:  - Discussed agreed upon goals that the patient/family member would be able to empty and change ostomy pouch, have the instructions needed to order home supplies and know how to trouble shoot pouch leakage and common peristomal skin problems.  - Discussed and demonstrated pouch emptying technique and pouch burping technique to empty flatus from pouch.  - Patient given instruction and provided demonstration of ostomy care including removal of old pouch, cleansing of the peristomal skin, measuring the stoma, cutting out/shaping the wafer, applying moldable barrier ring, and application of wafer/pouch to skin.  - Discussed and demonstrated the technique of crusting for peristomal skin irritation.  - Discussed anticipated pouch change frequency and need to change with any signs the pouch may be leaking.  - Discussed activities of daily living, bathing, diet and preventive measures regarding dehydration.  - Reviewed how to measure and record output and to contact surgeon if having greater than 1.5 liters of output in a 24 hour period.  - Reviewed when to contact the outpatient WOC nurse with questions/concerns.  - Discussed concealment options/clothing modifications and ostomy accessory products.  - reviewed discharge supplies    Recommendations/Plan:   - Final discharge ostomy supply list.  - Patient has met ostomy teaching goals.  - Continue with current pouching system.    Ostomy Discharge Goals:  - Patient/family member able to empty pouch independently.  - Patient/family member able to change pouch independently.  - Patient/family member able to identify ostomy  supplies to take home at discharge and knowledge of supply order process.     Recommended Consults:   - Case Management Plan of Care Discussed With:  - Patient  - Family  - RN primary     Ostomy Supplies:   - In-patient supplies at the bedside.  - Take home supplies at the bedside.  - Supplies available on unit.    Ostomy Product List:  FINAL  Hollister 1-Piece Soft Convex CTF Pouch 1 1/2 [id number] / [phone])  Copywriter, advertising- (052951/120307)  Casey Burkitt Adhesive Remover Wipes- [id number] / [phone])  25M No-Sting Barrier Film- Pads- ([id number]/3344)  Hollister Stoma Powder- (050829/7906)  Hollister Ostomy [place] 23-43- (050984/7300)  Engineer, technical sales Elastic Strip- ([id number])     WOCN Follow-up:  - We will sign off at this time  - goals met    Workup Time:  60 minutes     Charissa Bash, RN, BSN, [employer], [employer]  Wound Ostomy Consult Service

## 2020-12-17 LAB — BASIC METABOLIC PANEL
ANION GAP: 10 mmol/L (ref 5–14)
BLOOD UREA NITROGEN: 5 mg/dL — ABNORMAL LOW (ref 9–23)
BUN / CREAT RATIO: 7
CALCIUM: 9.4 mg/dL (ref 8.7–10.4)
CHLORIDE: 104 mmol/L (ref 98–107)
CO2: 27 mmol/L (ref 20.0–31.0)
CREATININE: 0.7 mg/dL
EGFR CKD-EPI (2021) MALE: >90 mL/min/{1.73_m2} (ref >=60)
GLUCOSE RANDOM: 139 mg/dL (ref 70–179)
POTASSIUM: 3.7 mmol/L (ref 3.4–4.8)
SODIUM: 141 mmol/L (ref 135–145)

## 2020-12-17 LAB — CBC
HEMATOCRIT: 38.5 % — ABNORMAL LOW (ref 39.0–48.0)
HEMOGLOBIN: 12.9 g/dL (ref 12.9–16.5)
MEAN CORPUSCULAR HEMOGLOBIN CONC: 33.5 g/dL (ref 32.0–36.0)
MEAN CORPUSCULAR HEMOGLOBIN: 28.4 pg (ref 25.9–32.4)
MEAN CORPUSCULAR VOLUME: 84.9 fL (ref 77.6–95.7)
MEAN PLATELET VOLUME: 7.8 fL (ref 6.8–10.7)
PLATELET COUNT: 323 10*9/L (ref 150–450)
RED BLOOD CELL COUNT: 4.53 10*12/L (ref 4.26–5.60)
RED CELL DISTRIBUTION WIDTH: 15.2 % (ref 12.2–15.2)
WBC ADJUSTED: 7.7 10*9/L (ref 3.6–11.2)

## 2020-12-17 LAB — MAGNESIUM: MAGNESIUM: 1.8 mg/dL (ref 1.6–2.6)

## 2020-12-17 LAB — PHOSPHORUS: PHOSPHORUS: 3.2 mg/dL (ref 2.4–5.1)

## 2020-12-17 MED ADMIN — magnesium sulfate 2gm/50mL IVPB: 2 g | INTRAVENOUS | @ 13:00:00 | Stop: 2020-12-17

## 2020-12-17 MED ADMIN — pantoprazole (PROTONIX) injection 40 mg: 40 mg | INTRAVENOUS | @ 02:00:00

## 2020-12-17 MED ADMIN — HYDROcodone-acetaminophen (NORCO) 5-325 mg per tablet 1 tablet: 1 | ORAL | @ 23:00:00 | Stop: 2020-12-27

## 2020-12-17 MED ADMIN — pantoprazole (PROTONIX) injection 40 mg: 40 mg | INTRAVENOUS | @ 15:00:00

## 2020-12-17 MED ADMIN — HYDROmorphone (PF) (DILAUDID) injection 0.5 mg: .5 mg | INTRAVENOUS | @ 09:00:00 | Stop: 2020-12-27

## 2020-12-17 MED ADMIN — acetaminophen (OFIRMEV) 10 mg/mL injection 1,000 mg: 1000 mg | INTRAVENOUS | @ 02:00:00 | Stop: 2020-12-16

## 2020-12-17 MED ADMIN — dextrose 5 % and sodium chloride 0.45 % infusion: 100 mL/h | INTRAVENOUS | @ 08:00:00

## 2020-12-17 MED ADMIN — dextrose 5 % and sodium chloride 0.45 % with KCl 20 mEq/L infusion: 0-1000 mL/h | INTRAVENOUS | @ 03:00:00

## 2020-12-17 MED ADMIN — methocarbamoL (ROBAXIN) tablet 500 mg: 500 mg | ORAL | @ 23:00:00

## 2020-12-17 MED ADMIN — potassium chloride 10 mEq in 100 mL IVPB: 10 meq | INTRAVENOUS | @ 15:00:00 | Stop: 2020-12-17

## 2020-12-17 MED ADMIN — pregabalin (LYRICA) capsule 100 mg: 100 mg | ORAL | @ 19:00:00

## 2020-12-17 MED ADMIN — methocarbamoL (ROBAXIN) tablet 500 mg: 500 mg | ORAL | @ 17:00:00

## 2020-12-17 MED ADMIN — potassium chloride 10 mEq in 100 mL IVPB: 10 meq | INTRAVENOUS | @ 16:00:00 | Stop: 2020-12-17

## 2020-12-17 MED ADMIN — lidocaine (LIDODERM) 5 % patch 2 patch: 2 | TRANSDERMAL | @ 15:00:00

## 2020-12-17 MED ADMIN — HYDROmorphone (PF) (DILAUDID) injection 0.5 mg: .5 mg | INTRAVENOUS | @ 03:00:00 | Stop: 2020-12-27

## 2020-12-17 MED ADMIN — pregabalin (LYRICA) capsule 100 mg: 100 mg | ORAL | @ 02:00:00

## 2020-12-17 MED ADMIN — melatonin tablet 3 mg: 3 mg | ORAL | @ 02:00:00

## 2020-12-17 MED ADMIN — HYDROmorphone (PF) (DILAUDID) injection 0.5 mg: .5 mg | INTRAVENOUS | @ 19:00:00 | Stop: 2020-12-27

## 2020-12-17 MED ADMIN — loratadine (CLARITIN) tablet 10 mg: 10 mg | ORAL | @ 15:00:00

## 2020-12-17 MED ADMIN — pregabalin (LYRICA) capsule 100 mg: 100 mg | ORAL | @ 15:00:00

## 2020-12-17 MED ADMIN — enoxaparin (LOVENOX) syringe 40 mg: 40 mg | SUBCUTANEOUS | @ 15:00:00

## 2020-12-17 NOTE — Unmapped (Signed)
VS Stable. No complaints of N/V. PRN pain medications given, see MAR. All medications administered as ordered. NGT to LIWS with dark green to brown drainage noted. Voiding adequately. No falls this shift, bed is low and locked, call bell within reach. Will continue to monitor.     Problem: Impaired Wound Healing  Goal: Optimal Wound Healing  Outcome: Progressing     Problem: Adult Inpatient Plan of Care  Goal: Plan of Care Review  Outcome: Progressing  Goal: Patient-Specific Goal (Individualized)  Outcome: Progressing  Goal: Absence of Hospital-Acquired Illness or Injury  Outcome: Progressing  Goal: Optimal Comfort and Wellbeing  Outcome: Progressing  Goal: Readiness for Transition of Care  Outcome: Progressing  Goal: Rounds/Family Conference  Outcome: Progressing     Problem: Fall Injury Risk  Goal: Absence of Fall and Fall-Related Injury  Outcome: Progressing     Problem: Self-Care Deficit  Goal: Improved Ability to Complete Activities of Daily Living  Outcome: Progressing

## 2020-12-17 NOTE — Unmapped (Signed)
Lower Gastrointestinal Surgery Progress Note    Today's Date: 12/17/2020  Admission Date: 12/12/2020  Length of Stay: Day 5, 5 Days Post-Op  Hospital Service: Henreitta Leber [facility])  Attending Physician: [name], MD     Assessment   Principal Problem:    Ulcerative colitis, acute, other complication (CMS-HCC)    Kevin Tran is a 60 y.o. male with a medical history significant for UC who is s/p robotic, laparoscopic assisted total abdominal colectomy with end Ileostomy??on 06/29/20 who was admitted to the hospital on 12/12/20 for a planned Ileoanal anastomosis(J-pouch surgery) which was converted to an end ileostomy due to concern for poor perfusion of the J-Pouch intraoperatively.     Subjective   Pulled NGT out last night. No N/V since, will attempt to advance diet slowly, having liquid OP from ostomy.   Plan      -sched lyrica, PRN Norco and IV dilaudid  - Decrease IVF to 100  - Increase frequency of 0.5:1 replacements to q6h  - Continue JP drain  - NPO, ok for sips of clears   - Strict I&Os, must subtract ice intake from recorded NGT output  - IV PPI BID  - Robaxin  - IV tylenol    Prophylaxis: prophylactic Lovenox, SCDs, out of bed/ambulate TID, IS    Access: peripheral IV    Dispo: floor status    Objective   Vital signs over the last 24 hours:  Temp:  [36.5 ??C (97.7 ??F)-37 ??C (98.6 ??F)] 36.9 ??C (98.4 ??F)  Heart Rate:  [72-93] 93  Resp:  [16-18] 16  BP: (106-136)/(54-84) 106/75  MAP (mmHg):  [85-100] 85  SpO2:  [97 %-98 %] 97 %    Physical Exam:  Gen: alert, well appearing, in no acute distress, NGT to bilious output  CV: regular rate, normotensive  Pulm: breathing comfortably on room air  Abd: soft, nondistended and diffusely tender. Surgical incisions approximated with Dermabond, clean and dry. Colostomy stoma pink, moist, nonprolapsed. Surgical JP drain with non purulent serosanguinous fluid. Pfannenstiel incision approximated with sutures  Ext: warm and well perfused bilaterally, no edema

## 2020-12-17 NOTE — Unmapped (Signed)
Pt has had some pain today PRNs were given for that. He has not had any nausea today. Pt has walked around unit today. Ostomy output is watery and green. Pt did receive MAG and potassium today. VSS will continue to monitor.    Problem: Impaired Wound Healing  Goal: Optimal Wound Healing  Outcome: Progressing     Problem: Adult Inpatient Plan of Care  Goal: Plan of Care Review  Outcome: Progressing  Goal: Patient-Specific Goal (Individualized)  Outcome: Progressing  Goal: Absence of Hospital-Acquired Illness or Injury  Outcome: Progressing  Goal: Optimal Comfort and Wellbeing  Outcome: Progressing  Goal: Readiness for Transition of Care  Outcome: Progressing  Goal: Rounds/Family Conference  Outcome: Progressing     Problem: Fall Injury Risk  Goal: Absence of Fall and Fall-Related Injury  Outcome: Progressing

## 2020-12-17 NOTE — Unmapped (Signed)
VSS.  Up with SBA.  Pain controlled with prn analgesic.  Free of falls.  Pt purposely pulled out his NGT, stating it was causing problems with his sinuses.  MD aware. Informed pt he could be causing himself harm by pulling out his NGT, but he didn't care.  Will monitor for N/V, remains NPO.  Cont POC.  Problem: Impaired Wound Healing  Goal: Optimal Wound Healing  Outcome: Ongoing - Unchanged     Problem: Adult Inpatient Plan of Care  Goal: Plan of Care Review  Outcome: Ongoing - Unchanged  Goal: Patient-Specific Goal (Individualized)  Outcome: Ongoing - Unchanged  Goal: Absence of Hospital-Acquired Illness or Injury  Outcome: Ongoing - Unchanged  Goal: Optimal Comfort and Wellbeing  Outcome: Ongoing - Unchanged  Goal: Readiness for Transition of Care  Outcome: Ongoing - Unchanged  Goal: Rounds/Family Conference  Outcome: Ongoing - Unchanged     Problem: Fall Injury Risk  Goal: Absence of Fall and Fall-Related Injury  Outcome: Ongoing - Unchanged     Problem: Self-Care Deficit  Goal: Improved Ability to Complete Activities of Daily Living  Outcome: Ongoing - Unchanged

## 2020-12-17 NOTE — Unmapped (Signed)
Kevin Tran [phone]

## 2020-12-18 LAB — CBC
HEMATOCRIT: 36.8 % — ABNORMAL LOW (ref 39.0–48.0)
HEMOGLOBIN: 12.3 g/dL — ABNORMAL LOW (ref 12.9–16.5)
MEAN CORPUSCULAR HEMOGLOBIN CONC: 33.5 g/dL (ref 32.0–36.0)
MEAN CORPUSCULAR HEMOGLOBIN: 28.6 pg (ref 25.9–32.4)
MEAN CORPUSCULAR VOLUME: 85.3 fL (ref 77.6–95.7)
MEAN PLATELET VOLUME: 7.5 fL (ref 6.8–10.7)
PLATELET COUNT: 312 10*9/L (ref 150–450)
RED BLOOD CELL COUNT: 4.32 10*12/L (ref 4.26–5.60)
RED CELL DISTRIBUTION WIDTH: 15.1 % (ref 12.2–15.2)
WBC ADJUSTED: 8.8 10*9/L (ref 3.6–11.2)

## 2020-12-18 LAB — BASIC METABOLIC PANEL
ANION GAP: 9 mmol/L (ref 5–14)
BLOOD UREA NITROGEN: 7 mg/dL — ABNORMAL LOW (ref 9–23)
BUN / CREAT RATIO: 7
CALCIUM: 9.1 mg/dL (ref 8.7–10.4)
CHLORIDE: 104 mmol/L (ref 98–107)
CO2: 27 mmol/L (ref 20.0–31.0)
CREATININE: 0.99 mg/dL
EGFR CKD-EPI (2021) MALE: 87 mL/min/{1.73_m2} (ref >=60)
GLUCOSE RANDOM: 113 mg/dL (ref 70–179)
POTASSIUM: 3.6 mmol/L (ref 3.4–4.8)
SODIUM: 140 mmol/L (ref 135–145)

## 2020-12-18 LAB — MAGNESIUM: MAGNESIUM: 1.9 mg/dL (ref 1.6–2.6)

## 2020-12-18 LAB — PHOSPHORUS: PHOSPHORUS: 3.6 mg/dL (ref 2.4–5.1)

## 2020-12-18 MED ADMIN — HYDROcodone-acetaminophen (NORCO) 5-325 mg per tablet 1 tablet: 1 | ORAL | @ 14:00:00 | Stop: 2020-12-27

## 2020-12-18 MED ADMIN — melatonin tablet 3 mg: 3 mg | ORAL | @ 03:00:00

## 2020-12-18 MED ADMIN — enoxaparin (LOVENOX) syringe 40 mg: 40 mg | SUBCUTANEOUS | @ 14:00:00

## 2020-12-18 MED ADMIN — methocarbamoL (ROBAXIN) tablet 500 mg: 500 mg | ORAL | @ 04:00:00

## 2020-12-18 MED ADMIN — lidocaine (LIDODERM) 5 % patch 2 patch: 2 | TRANSDERMAL | @ 14:00:00

## 2020-12-18 MED ADMIN — loratadine (CLARITIN) tablet 10 mg: 10 mg | ORAL | @ 14:00:00

## 2020-12-18 MED ADMIN — HYDROcodone-acetaminophen (NORCO) 5-325 mg per tablet 1 tablet: 1 | ORAL | @ 03:00:00 | Stop: 2020-12-27

## 2020-12-18 MED ADMIN — pantoprazole (PROTONIX) injection 40 mg: 40 mg | INTRAVENOUS | @ 03:00:00

## 2020-12-18 MED ADMIN — HYDROcodone-acetaminophen (NORCO) 5-325 mg per tablet 1 tablet: 1 | ORAL | @ 20:00:00 | Stop: 2020-12-27

## 2020-12-18 MED ADMIN — methocarbamoL (ROBAXIN) tablet 500 mg: 500 mg | ORAL | @ 17:00:00

## 2020-12-18 MED ADMIN — dextrose 5 % and sodium chloride 0.45 % infusion: 100 mL/h | INTRAVENOUS | @ 04:00:00

## 2020-12-18 MED ADMIN — dextrose 5 % and sodium chloride 0.45 % infusion: 100 mL/h | INTRAVENOUS | @ 14:00:00

## 2020-12-18 MED ADMIN — pregabalin (LYRICA) capsule 100 mg: 100 mg | ORAL | @ 03:00:00

## 2020-12-18 MED ADMIN — HYDROcodone-acetaminophen (NORCO) 5-325 mg per tablet 1 tablet: 1 | ORAL | @ 08:00:00 | Stop: 2020-12-27

## 2020-12-18 MED ADMIN — pantoprazole (PROTONIX) injection 40 mg: 40 mg | INTRAVENOUS | @ 14:00:00

## 2020-12-18 MED ADMIN — pregabalin (LYRICA) capsule 100 mg: 100 mg | ORAL | @ 14:00:00

## 2020-12-18 MED ADMIN — methocarbamoL (ROBAXIN) tablet 500 mg: 500 mg | ORAL | @ 22:00:00

## 2020-12-18 MED ADMIN — methocarbamoL (ROBAXIN) tablet 500 mg: 500 mg | ORAL | @ 10:00:00

## 2020-12-18 MED ADMIN — pregabalin (LYRICA) capsule 100 mg: 100 mg | ORAL | @ 20:00:00

## 2020-12-18 NOTE — Unmapped (Signed)
Pt A&Ox4, VSS, complaints of pain managed with PRN meds. Pt independently ambulatory in room, no difficulties, no falls. Pt voiding adequate UOP. Lap sites CDI. JP drain intact; serosanguinous output, ostomy appliance intact; green watery output. Will continue to monitor pt with safety parameters in place.     Problem: Impaired Wound Healing  Goal: Optimal Wound Healing  Outcome: Progressing     Problem: Adult Inpatient Plan of Care  Goal: Plan of Care Review  Outcome: Progressing  Goal: Patient-Specific Goal (Individualized)  Outcome: Progressing  Goal: Absence of Hospital-Acquired Illness or Injury  Outcome: Progressing  Goal: Optimal Comfort and Wellbeing  Outcome: Progressing  Goal: Readiness for Transition of Care  Outcome: Progressing  Goal: Rounds/Family Conference  Outcome: Progressing     Problem: Fall Injury Risk  Goal: Absence of Fall and Fall-Related Injury  Outcome: Progressing     Problem: Self-Care Deficit  Goal: Improved Ability to Complete Activities of Daily Living  Outcome: Progressing

## 2020-12-18 NOTE — Unmapped (Signed)
Lower Gastrointestinal Surgery Progress Note    Today's Date: 12/18/2020  Admission Date: 12/12/2020  Length of Stay: Day 6, 6 Days Post-Op  Hospital Service: Henreitta Leber [facility])  Attending Physician: [name], MD     Assessment   Principal Problem:    Ulcerative colitis, acute, other complication (CMS-HCC)    Kevin Tran is a 60 y.o. male with a medical history significant for UC who is s/p robotic, laparoscopic assisted total abdominal colectomy with end Ileostomy??on 06/29/20 who was admitted to the hospital on 12/12/20 for a planned Ileoanal anastomosis(J-pouch surgery) which was converted to an end ileostomy due to concern for poor perfusion of the J-Pouch intraoperatively.     Subjective   NAEO. VSS. Pain well controlled. Still having low UOP (0.35 ml/kg/hr). Reports no nausea. States he is feeling much better.   Plan     - sched lyrica, PRN Norco and IV dilaudid  - IVF at 100  - 0.5:1 replacements q6h  - Continue JP drain  - Transition to CLD  - Strict I&Os  - IV PPI BID  - Robaxin  - IV tylenol  - replete lytes    Prophylaxis: prophylactic Lovenox, SCDs, out of bed/ambulate TID, IS    Access: peripheral IV    Dispo: floor status    Objective   Vital signs over the last 24 hours:  Temp:  [36.6 ??C (97.9 ??F)-37.1 ??C (98.8 ??F)] 36.9 ??C (98.5 ??F)  Heart Rate:  [80-94] 89  Resp:  [16-18] 17  BP: (106-131)/(75-84) 126/84  MAP (mmHg):  [85-98] 98  SpO2:  [95 %-98 %] 97 %    Physical Exam:  Gen: alert, well appearing, in no acute distress  CV: regular rate, normotensive  Pulm: breathing comfortably on room air  Abd: soft, nondistended and diffusely tender. Surgical incisions approximated with Dermabond, clean and dry. Colostomy stoma pink, moist, nonprolapsed. Surgical JP drain with non purulent serosanguinous fluid. Pfannenstiel incision approximated with sutures  Ext: warm and well perfused bilaterally, no edema

## 2020-12-19 LAB — BASIC METABOLIC PANEL
ANION GAP: 8 mmol/L (ref 5–14)
BLOOD UREA NITROGEN: 7 mg/dL — ABNORMAL LOW (ref 9–23)
BUN / CREAT RATIO: 7
CALCIUM: 9.4 mg/dL (ref 8.7–10.4)
CHLORIDE: 104 mmol/L (ref 98–107)
CO2: 27 mmol/L (ref 20.0–31.0)
CREATININE: 1.04 mg/dL
EGFR CKD-EPI (2021) MALE: 82 mL/min/{1.73_m2} (ref >=60)
GLUCOSE RANDOM: 134 mg/dL (ref 70–179)
POTASSIUM: 4.3 mmol/L (ref 3.4–4.8)
SODIUM: 139 mmol/L (ref 135–145)

## 2020-12-19 LAB — CBC
HEMATOCRIT: 38.7 % — ABNORMAL LOW (ref 39.0–48.0)
HEMOGLOBIN: 12.8 g/dL — ABNORMAL LOW (ref 12.9–16.5)
MEAN CORPUSCULAR HEMOGLOBIN CONC: 33.1 g/dL (ref 32.0–36.0)
MEAN CORPUSCULAR HEMOGLOBIN: 28.2 pg (ref 25.9–32.4)
MEAN CORPUSCULAR VOLUME: 85.4 fL (ref 77.6–95.7)
MEAN PLATELET VOLUME: 8.1 fL (ref 6.8–10.7)
PLATELET COUNT: 368 10*9/L (ref 150–450)
RED BLOOD CELL COUNT: 4.53 10*12/L (ref 4.26–5.60)
RED CELL DISTRIBUTION WIDTH: 15.2 % (ref 12.2–15.2)
WBC ADJUSTED: 13.5 10*9/L — ABNORMAL HIGH (ref 3.6–11.2)

## 2020-12-19 LAB — PHOSPHORUS: PHOSPHORUS: 3.5 mg/dL (ref 2.4–5.1)

## 2020-12-19 LAB — MAGNESIUM: MAGNESIUM: 1.9 mg/dL (ref 1.6–2.6)

## 2020-12-19 MED ORDER — HYDROCODONE 5 MG-ACETAMINOPHEN 325 MG TABLET
ORAL_TABLET | 0 refills | 0 days | Status: CP
Start: 2020-12-19 — End: ?
  Filled 2020-12-19: qty 15, 4d supply, fill #0

## 2020-12-19 MED ORDER — LOPERAMIDE 2 MG CAPSULE
ORAL_CAPSULE | Freq: Two times a day (BID) | ORAL | 0 refills | 30 days | Status: CP
Start: 2020-12-19 — End: 2021-01-18
  Filled 2020-12-19: qty 60, 7d supply, fill #0

## 2020-12-19 MED ORDER — METHOCARBAMOL 500 MG TABLET
ORAL_TABLET | 0 refills | 0 days | Status: CP
Start: 2020-12-19 — End: ?
  Filled 2020-12-19: qty 10, 5d supply, fill #0

## 2020-12-19 MED ORDER — ONDANSETRON 4 MG DISINTEGRATING TABLET
ORAL_TABLET | Freq: Four times a day (QID) | ORAL | 0 refills | 2 days | Status: CP | PRN
Start: 2020-12-19 — End: ?
  Filled 2020-12-19: qty 8, 2d supply, fill #0

## 2020-12-19 MED ADMIN — loratadine (CLARITIN) tablet 10 mg: 10 mg | ORAL | @ 13:00:00 | Stop: 2020-12-19

## 2020-12-19 MED ADMIN — pantoprazole (PROTONIX) injection 40 mg: 40 mg | INTRAVENOUS | @ 02:00:00

## 2020-12-19 MED ADMIN — dextrose 5 % and sodium chloride 0.45 % infusion: 100 mL/h | INTRAVENOUS | @ 02:00:00

## 2020-12-19 MED ADMIN — HYDROcodone-acetaminophen (NORCO) 5-325 mg per tablet 2 tablet: 2 | ORAL | @ 17:00:00 | Stop: 2020-12-19

## 2020-12-19 MED ADMIN — HYDROcodone-acetaminophen (NORCO) 5-325 mg per tablet 1 tablet: 1 | ORAL | @ 11:00:00 | Stop: 2020-12-19

## 2020-12-19 MED ADMIN — enoxaparin (LOVENOX) syringe 40 mg: 40 mg | SUBCUTANEOUS | @ 13:00:00 | Stop: 2020-12-19

## 2020-12-19 MED ADMIN — melatonin tablet 3 mg: 3 mg | ORAL | @ 05:00:00 | Stop: 2020-12-19

## 2020-12-19 MED ADMIN — methocarbamoL (ROBAXIN) tablet 500 mg: 500 mg | ORAL | @ 02:00:00

## 2020-12-19 MED ADMIN — lidocaine (LIDODERM) 5 % patch 2 patch: 2 | TRANSDERMAL | @ 13:00:00 | Stop: 2020-12-19

## 2020-12-19 MED ADMIN — HYDROcodone-acetaminophen (NORCO) 5-325 mg per tablet 2 tablet: 2 | ORAL | @ 05:00:00 | Stop: 2020-12-19

## 2020-12-19 MED ADMIN — pregabalin (LYRICA) capsule 100 mg: 100 mg | ORAL | @ 02:00:00

## 2020-12-19 MED ADMIN — pantoprazole (PROTONIX) EC tablet 40 mg: 40 mg | ORAL | @ 13:00:00 | Stop: 2020-12-19

## 2020-12-19 MED ADMIN — loperamide (IMODIUM) capsule 2 mg: 2 mg | ORAL | @ 12:00:00 | Stop: 2020-12-19

## 2020-12-19 MED ADMIN — pregabalin (LYRICA) capsule 100 mg: 100 mg | ORAL | @ 13:00:00 | Stop: 2020-12-19

## 2020-12-19 MED ADMIN — lactated ringers bolus 500 mL: 500 mL | INTRAVENOUS | @ 12:00:00 | Stop: 2020-12-19

## 2020-12-19 NOTE — Unmapped (Signed)
[facility] Lower Gastrointestinal Surgery  Discharge Summary     Identifying Information: Kevin Tran  03/01/1960  [id number]   Code Status: Full Code   PCP:  [facility]   Primary Surgeon:  Primary: [name], MD  Resident - Assisting: Luther Redo, MD; Lysle Rubens, MD  Resident - Observing: Benjaman Pott, MD       Admit Date: 12/12/2020    Discharge Date: 12/19/2020    Discharge Attending: [name], MD   Discharge Service:  Henreitta Leber [facility])   Discharge To:  Home      Primary Discharge Diagnosis:  Principal Problem:    Ulcerative colitis, acute, other complication (CMS-HCC)    Operation: Proctectomy with J-pouch creation. Ischemic small bowel of distal J-pouch with minimal to no blood supply confirmed with ICG; thus ischemic small bowel was resected and end ileostomy was created on 12/12/2020    Hospital Course:  Kevin Tran is a 60 y.o. male who underwent a proctectomy with J-pouch creation. Ischemic small bowel of distal J-pouch with minimal to no blood supply confirmed with ICG; thus ischemic small bowel was resected and end ileostomy was created on 12/12/2020. He tolerated the procedure well, was extubated in the OR, and was taken to the PACU and received routine postoperative care before being transferred to the floor. Patient developed nausea and vomiting on POD 3. NG tube was placed with 1.8 L out over the first 2 hrs. Replacement fluids were initiated. NG tube output slowed and ileostomy output increased. Patient discontinued his NG tube on 11/6. It was not replaced since he had no further nausea or emesis.    He was able to void without difficulty after his urinary catheter was removed.  His diet was slowly advanced and at the time of discharge, he was tolerating a regular diet. He was started on Imodium for ostomy output ~1.5L/day, stating with his previous ostomy, his output was thin and liquid in consistency as well. Wound and ostomy nurses taught the patient how to care for their ostomy and change his appliance, and he was able to demonstrate this appropriately. The patient will be discharged on prophylactic Lovenox due to having inflammatory bowel disease and the associated higher risk of VTE, and was taught how to administer the injections by nursing during his hospitalization. He was taught drain care by nursing as well. He will return to clinic on Friday, 12/22/2020 with labwork and for drain and ostomy output evaluation. He is being discharged on 12/19/2020.    Condition at Discharge: Stable. The patient is back to their functional baseline and is self-managing all activites of daily living.    Discharge Day Note:   Assessment and Discharge Day Services:  The patient was seen and examined by the Sur Gi [facility]) team on the day of discharge. Vital signs and laboratory values were assessed. Surgical wounds were inspected. The discharge plan was discussed, instructions were given, and all questions answered.     Subjective:  No acute events overnight. The patient reports adequate pain control and denies fever or chills. The patient continues to pass flatus and has mildly elevated ostomy output at 1.4 L/day, for which Imodium will be started.    Objective:   BP (P) 114/82  - Pulse (P) 84  - Temp (P) 36.9 ??C (98.4 ??F) (Oral)  - Resp (P) 16  - Wt 72.8 kg (160 lb 7.9 oz)  - SpO2 97%  - BMI 24.40 kg/m??  General Appearance: alert, awake, well appearing, in no acute distress.  Abdomen: soft, nondistended, minimally tender to palpation diffusely. Surgical incisions well approximated with Dermabond, clean and dry. Ostomy stoma pink, moist, with semi-solid stool in appliance. Surgical JP drain with light serosanguineous fluid in bulb. Pfannenstiel incision well approximated with sutures.  Discharge Medications:      Your Medication List      STOP taking these medications    acetaminophen 500 MG tablet  Commonly known as: TYLENOL     methotrexate 2.5 MG tablet START taking these medications    enoxaparin 40 mg/0.4 mL Syrg  Commonly known as: LOVENOX  Inject 0.4 mL (40 mg total) under the skin daily for 6 days.  Start taking on: December 20, 2020     HYDROcodone-acetaminophen 5-325 mg per tablet  Commonly known as: NORCO  Take 1 tablet by mouth every 6-8 hours as needed for pain.     loperamide 2 mg capsule  Commonly known as: IMODIUM  Take 1 capsule (2 mg total) by mouth Two (2) times a day. You may increase up to 2 capsules four times a day to keep ostomy output around 1 L/day     methocarbamoL 500 MG tablet  Commonly known as: ROBAXIN  Take 0.5-1 tablet (250-500mg ) by mouth twice daily as needed for muscle spasms.     ondansetron 4 MG disintegrating tablet  Commonly known as: ZOFRAN-ODT  Dissolve 1 tablet on the tongue every six (6) hours as needed for nausea.        CONTINUE taking these medications    ferrous sulfate 325 (65 FE) MG tablet  Take 325 mg by mouth in the morning.     folic acid 1 MG tablet  Commonly known as: FOLVITE  Take 1 tablet (1 mg total) by mouth in the morning.     loratadine 10 mg tablet  Commonly known as: CLARITIN  Take 10 mg by mouth daily.     melatonin 3 mg Tab  Take 1 tablet (3 mg total) by mouth nightly as needed.     multivitamin per tablet  Commonly known as: TAB-A-VITE/THERAGRAN  Take 1 tablet by mouth daily.     omeprazole 20 MG tablet  Commonly known as: PriLOSEC OTC  Take 20 mg by mouth daily.     pregabalin 100 MG capsule  Commonly known as: LYRICA  Take 100 mg by mouth Two (2) times a day.        Pending Test Results:  Pending Labs     Order Current Status    Surgical pathology exam In process        Discharge Instructions:  Resources and Referrals     Ostomy Supplies      Length of Need: life time    Ostomy Product List:  FINAL  Hollister 1-Piece Soft Convex CTF Pouch 1 1/2 [phone] / [phone])  Copywriter, advertising- (052951/120307)  Casey Burkitt Adhesive Remover Wipes- [phone] / [id number])  59M No-Sting Barrier Film- Pads- (050338/3344)  Hollister Stoma Powder- (050829/7906)  [facility] 23-43- (050984/7300)  [employer]al architect Strip- (052369/120700)         Ostomy Product List:  FINAL  Hollister 1-Piece Soft Convex CTF Pouch 1 1/2 [phone] / [phone])  Copywriter, advertising- (052951/120307)  Casey Burkitt Adhesive Remover Wipes- [id number] / [phone])  59M No-Sting Barrier Film- Pads- (050338/3344)  Hollister Stoma Powder- (050829/7906)  Hollister Ostomy [place] 23-43- (050984/7300)  Naval architect Strip- (052369/120700)  Felisa Bonier [phone]      Reasons to call after discharge:  ???  You have a fever with a temperature of more than 101.5 F.  ???  You have new nausea and vomiting that you did not have in the hospital, or you have worsening nausea and vomiting than what you were experiencing in the hospital.  ???  You are not eating or drinking because of having nausea and vomiting.  ???  Your pain is getting worse and not improving.  ???  You feel dehydrated, meaning your urine is very dark, and/or you feel light-headed, dizzy and weak.  ???  Your incision is turning red, has opened up, has new drainage, or is excessively bleeding.   ???  If you were discharged with a drain and the drain has fallen out, starts to leak, or stops working.   ???  If you have an ostomy and you are having more than 1 L (liter) of stool output in 24 hours.  ???  You have any new or lingering concerns and are not sure if you should be worried about them.    Who to contact after discharge:  For all questions/concerns during business hours (Monday - Friday between 8:00 AM - 4:30 PM) or to schedule a follow up appointment, call your Nurse Coordinator. If you are unable to reach your Nurse Coordinator directly during business hours, you may call the GI Surgery Clinic at [phone].  ???  For Dr. Miachel Roux: Felizardo Hoffmann at [phone]    For URGENT CONCERNS after business hours (Monday - Friday after 4:30 PM), call the [facility] Operator at [phone] and ask for the Surgical Resident On-Call.  ???  You will be directed to a surgery resident who is likely not familiar with your specific care, but can help you deal with issues that cannot wait until regular business hours.  ???  Please be aware that the person is responding to many in-hospital emergencies and patient issues, and may not answer your phone call immediately.    For ostomy questions or concerns during business hours (Monday - Friday between 8:00 AM - 4:30 PM), call the GI Surgery Clinic at [phone] to speak with or schedule an appointment with a Wound, Ostomy, and Continence Nurse (WOCN).      Future Appointments:  Appointments which have been scheduled for you    Dec 22, 2020  9:30 AM  (Arrive by 9:00 AM)  RETURN  GENERAL with [name], MD  [facility] MULTISPECIALTY SURGERY GI SURGERY [place] REGION) [address]  [name] [address]  [phone]      Dec 22, 2020 11:00 AM  (Arrive by 10:30 AM)  OSTOMY with SURG GI [facility]  [facility] MULTISPECIALTY SURGERY GI SURGERY [place] [facility] REGION) [address]  [name] [address]  [phone]      Mar 22, 2021 10:00 AM  (Arrive by 9:45 AM)  NEW  RHEUMATOLOGY with Clearnce Hasten, MD  [facility] RHEUMATOLOGY SPECIALTY [place] [facility] REGION) [address]  [name] [address]  [phone]           I personally spent less than 30 minutes on discharge planning services.  Bunnie Lederman A Manmeet Arzola, PA-C

## 2020-12-19 NOTE — Unmapped (Signed)
Plan of care reviewed with patient. Patient verbalized understanding. VS have remained stable. Patient maintaining adequate pain control on current regimen. Patient ambulating in hallway independently. Abdominal lap sites healing well. No sign and symptoms of infection noted.  Patient advanced to clears followed bu surgical GI soft.  Patient tolerated diet without evidence of nausea/ vomiting.  Patient without falls or evidence of new injury.    Problem: Impaired Wound Healing  Goal: Optimal Wound Healing  Outcome: Progressing     Problem: Adult Inpatient Plan of Care  Goal: Plan of Care Review  Outcome: Progressing  Goal: Patient-Specific Goal (Individualized)  Outcome: Progressing  Goal: Absence of Hospital-Acquired Illness or Injury  Outcome: Progressing  Goal: Optimal Comfort and Wellbeing  Outcome: Progressing  Goal: Readiness for Transition of Care  Outcome: Progressing  Goal: Rounds/Family Conference  Outcome: Progressing     Problem: Fall Injury Risk  Goal: Absence of Fall and Fall-Related Injury  Outcome: Progressing     Problem: Self-Care Deficit  Goal: Improved Ability to Complete Activities of Daily Living  Outcome: Resolved     Problem: Self-Care Deficit  Goal: Improved Ability to Complete Activities of Daily Living  Outcome: Resolved

## 2020-12-19 NOTE — Unmapped (Signed)
VSS, pt independently ambulatory, no falls/injuries. JP drain intact, serosanguinous output, Ostomy intact, green watery output. Lap sites CDI, no s/s of infection. Pt tolerating diet, no n/v.     Problem: Impaired Wound Healing  Goal: Optimal Wound Healing  Outcome: Progressing     Problem: Adult Inpatient Plan of Care  Goal: Plan of Care Review  Outcome: Progressing  Goal: Patient-Specific Goal (Individualized)  Outcome: Progressing  Goal: Absence of Hospital-Acquired Illness or Injury  Outcome: Progressing  Goal: Optimal Comfort and Wellbeing  Outcome: Progressing  Goal: Readiness for Transition of Care  Outcome: Progressing  Goal: Rounds/Family Conference  Outcome: Progressing     Problem: Fall Injury Risk  Goal: Absence of Fall and Fall-Related Injury  Outcome: Progressing

## 2020-12-20 MED ORDER — ENOXAPARIN 40 MG/0.4 ML SUBCUTANEOUS SYRINGE
SUBCUTANEOUS | 0 refills | 6 days | Status: CP
Start: 2020-12-20 — End: 2020-12-26
  Filled 2020-12-19: qty 2.4, 6d supply, fill #0

## 2020-12-21 NOTE — Unmapped (Signed)
Recent:   What is the date of your last related visit?  12/19/20 discharged following colostomy placement  Related acute medications Rx'd:  Zofran, omeprazole  Home treatment tried:  zofran @1800   Omeprazole @0900 , 1500    Relevant:   Allergies: Patient has no known allergies.  Medications: Zofran, omeprazole   Health History: lap abdominal surgery 12/12/20 with colostomy placement  Weight: not asked      Reason for Disposition  ??? [1] Vomiting AND [2] abdomen looks much more swollen than usual     Pt reports increased bloating from indigestion. Pt took extra dose of omeprazole for the indigestion but did not experience relief from taking it. He also took a dose of zofan at 1800 without relief of his nausea. At 2045, pt had an episode of vomiting tan liquid. Denies abdominal pain, but reports abdomen feeling tight.    Answer Assessment - Initial Assessment Questions  1. VOMITING SEVERITY: How many times have you vomited in the past 24 hours?      - MILD:  1 - 2 times/day     - MODERATE: 3 - 5 times/day, decreased oral intake without significant weight loss or symptoms of dehydration     - SEVERE: 6 or more times/day, vomits everything or nearly everything, with significant weight loss, symptoms of dehydration       1 episode @ 2044 12/20/20  2. ONSET: When did the vomiting begin?       2044 12/20/20  3. FLUIDS: What fluids or food have you vomited up today? Have you been able to keep any fluids down?      Fluids today: 2 (16 oz bottles of water), and a glass of tea  4. ABDOMINAL PAIN: Are your having any abdominal pain? If yes : How bad is it and what does it feel like? (e.g., crampy, dull, intermittent, constant)       Maybe a little , crampy, constant  5. DIARRHEA: Is there any diarrhea? If Yes, ask: How many times today?       Denies, no changes in the appearance in his colostomy bag  6. CONTACTS: Is there anyone else in the family with the same symptoms?       denies  7. CAUSE: What do you think is causing your vomiting?      Indigestion, acid reflux  8. HYDRATION STATUS: Any signs of dehydration? (e.g., dry mouth [not only dry lips], too weak to stand) When did you last urinate?      Mucous membranes are moist, able to stand, last void 12/20/20 2145  9. OTHER SYMPTOMS: Do you have any other symptoms? (e.g., fever, headache, vertigo, vomiting blood or coffee grounds, recent head injury)      Denies fever, headache, vertigo, vomiting blood  10. PREGNANCY: Is there any chance you are pregnant? When was your last menstrual period?        n/a    Protocols used: [facility]

## 2020-12-22 ENCOUNTER — Ambulatory Visit: Admit: 2020-12-22 | Discharge: 2020-12-22 | Payer: PRIVATE HEALTH INSURANCE

## 2020-12-22 LAB — CBC
HEMATOCRIT: 43.4 % (ref 39.0–48.0)
HEMOGLOBIN: 14.3 g/dL (ref 12.9–16.5)
MEAN CORPUSCULAR HEMOGLOBIN CONC: 33.1 g/dL (ref 32.0–36.0)
MEAN CORPUSCULAR HEMOGLOBIN: 27.9 pg (ref 25.9–32.4)
MEAN CORPUSCULAR VOLUME: 84.2 fL (ref 77.6–95.7)
MEAN PLATELET VOLUME: 8 fL (ref 6.8–10.7)
PLATELET COUNT: 680 10*9/L — ABNORMAL HIGH (ref 150–450)
RED BLOOD CELL COUNT: 5.15 10*12/L (ref 4.26–5.60)
RED CELL DISTRIBUTION WIDTH: 14.8 % (ref 12.2–15.2)
WBC ADJUSTED: 11.2 10*9/L (ref 3.6–11.2)

## 2020-12-22 LAB — BASIC METABOLIC PANEL
ANION GAP: 11 mmol/L (ref 5–14)
BLOOD UREA NITROGEN: 20 mg/dL (ref 9–23)
BUN / CREAT RATIO: 19
CALCIUM: 10.1 mg/dL (ref 8.7–10.4)
CHLORIDE: 96 mmol/L — ABNORMAL LOW (ref 98–107)
CO2: 25 mmol/L (ref 20.0–31.0)
CREATININE: 1.05 mg/dL
EGFR CKD-EPI (2021) MALE: 81 mL/min/{1.73_m2} (ref >=60)
GLUCOSE RANDOM: 107 mg/dL — ABNORMAL HIGH (ref 70–99)
POTASSIUM: 4.2 mmol/L (ref 3.4–4.8)
SODIUM: 132 mmol/L — ABNORMAL LOW (ref 135–145)

## 2020-12-22 LAB — PHOSPHORUS: PHOSPHORUS: 4.2 mg/dL (ref 2.4–5.1)

## 2020-12-22 LAB — MAGNESIUM: MAGNESIUM: 2.2 mg/dL (ref 1.6–2.6)

## 2020-12-22 MED ORDER — METHOCARBAMOL 500 MG TABLET
ORAL_TABLET | 0 refills | 0 days | Status: CP
Start: 2020-12-22 — End: ?

## 2020-12-22 MED ORDER — HYDROCODONE 5 MG-ACETAMINOPHEN 325 MG TABLET
ORAL_TABLET | 0 refills | 0 days | Status: CP
Start: 2020-12-22 — End: ?

## 2020-12-22 MED ORDER — PREGABALIN 100 MG CAPSULE
ORAL_CAPSULE | Freq: Two times a day (BID) | ORAL | 0 refills | 10 days | Status: CP
Start: 2020-12-22 — End: 2021-01-01

## 2020-12-22 NOTE — Unmapped (Signed)
Colorectal Surgery Follow-up Clinic Note      Attending Physician:  Rogene Houston, MD  Date of Service: 12/22/2020    Attending Surgeon A/P     As noted, doing well eating a regular diet hydrating well. . Stoma working well. Abdomen benign. Several stiches removed.  Will RTC in 1 week  Questions answered.      I saw and evaluated the patient, participating in the key portions of the service.?? I reviewed the resident???s note.?? I agree with the resident???s findings and plan. Laverda Page, MD      Assessment:   Kevin Tran is a 60 y.o. male with a history of UC s/p TAC who underwent robotic converted to open proctectomy, ileostomy takedown and plan for J-Pouch on 11/1. Unfortunately distal limb of J-Pouch ischemic and had to create end ileostomy. Patient presents today for follow up after discharge on 11/8.          Plan:  - Alternating sutures and drain removed in clinic today  - Follow up in 1 week, will remove remaining sutures  - Recommended omeprazole and tums for his indigestion, which he was taking pre-op  - Recommend titrating imodium and to be wary to too much causing constipation        Subjective:   Patient states that overnight Wednesday he became nauseated and was having multiple episodes of emesis as well as high ostomy output. This resolved by Thursday morning. He has been taking 2 imodium in the morning and 2 in the evening. He has had indigestion but hasn't restarted his home omeprazole or Tums which he took regularly pre op. His pain is well controlled. He is taking in plenty of fluids PO.        Objective    BP 117/83  - Pulse 93  - Temp 36.4 ??C (97.5 ??F) (Temporal)  - Ht 172.7 cm (5' 8)  - Wt 69.6 kg (153 lb 7 oz)  - BMI 23.33 kg/m??    Physical Exam  General: NAD. Alert and oriented  Neuro: Responds appropriately to questions. Moves 4/4 extremities.   Cardiac: normal rate, regular rhythm  Lungs: bilateral equal chest rise   Abdomen: Soft, end ileostomy mucosa healthy, stool in ostomy.   Extr: Warm, dry     Diagnostic studies:   None

## 2020-12-25 DIAGNOSIS — K5130 Ulcerative (chronic) rectosigmoiditis without complications: Principal | ICD-10-CM

## 2020-12-29 ENCOUNTER — Ambulatory Visit: Admit: 2020-12-29 | Discharge: 2020-12-29 | Payer: PRIVATE HEALTH INSURANCE

## 2020-12-29 ENCOUNTER — Institutional Professional Consult (permissible substitution): Admit: 2020-12-29 | Discharge: 2020-12-29 | Payer: PRIVATE HEALTH INSURANCE

## 2020-12-29 MED ORDER — LOPERAMIDE 2 MG CAPSULE
ORAL_CAPSULE | Freq: Four times a day (QID) | ORAL | 0 refills | 30 days | Status: CP | PRN
Start: 2020-12-29 — End: 2021-01-28

## 2020-12-29 MED ORDER — HYDROCODONE 5 MG-ACETAMINOPHEN 325 MG TABLET
ORAL_TABLET | 0 refills | 0 days | Status: CP
Start: 2020-12-29 — End: ?

## 2020-12-29 NOTE — Unmapped (Signed)
LOWER GASTROINTESTINAL SURGERY CLINIC NOTE    Patient Name: Kevin Tran  Patient MRN: [id number]  Date of Service: 12/29/2020  Attending: [name], MD    Reason for Visit   Postoperative follow up    Attending Surgeon A/P     As noted, RTC feeling better. Tolerating a regular diet. Evacuating w/o difficulty. Some leakage from stoma.  On exam, abdomen is benign. There is some retraction of superior portion of stoma. This was improved with removal of adjacent nylon stiches. Sean and refitted by [employer].  Questions answered to satisfaction. Will RTC 11/28.   I saw and evaluated the patient, participating in the key portions of the service.?? I reviewed the resident???s note.?? I agree with the resident???s findings and plan. Laverda Page, MD      Assessment   Kevin Tran is a 60 y.o. male  with a medical history significant for UC who is s/p??robotic, laparoscopic assisted total abdominal colectomy with end Ileostomy??on 06/29/20 who was admitted to the hospital on 12/12/20 for a planned Ileoanal anastomosis(J-pouch surgery) which was converted to an end ileostomy due to concern for poor perfusion of the J-Pouch intraoperatively. Pt reports minimal abdominal pain and leaking from ileostomy site but no other concerns after surgery.    Plan   ?? F/u 11/28  ?? Sutures removed in clinic on lower incision and near ostomy site  ?? Wound ostomy assisted with ostomy bag in clinic  ?? Refill pain medication and immodium    Subjective   HPI: Kevin Tran is a 60 y.o. male  with a medical history significant for UC who is s/p??robotic, laparoscopic assisted total abdominal colectomy with end Ileostomy??on 06/29/20 who was admitted to the hospital on 12/12/20 for a planned Ileoanal anastomosis(J-pouch surgery) which was converted to an end ileostomy due to concern for poor perfusion of the J-Pouch intraoperatively. Pt reporting abdominal pain with eating but states he is able to pass food and has no associated nausea or vomiting. Reports peptobismol helps with abdominal pain. Endorses pain near ostomy site and states ostomy bag continues to leak. States he was having significant ostomy output that was liquid in past but that has been controlled with 4 immodiums daily. Reports now it is apple sauce consistency. Changes his bag every other day. In past, patient only had to change bag twice weekly. Reporting some pain near LLQ incision site but notes no purulence, redness, or drainage near that site.    Review of Systems: A 12 system review of systems was negative except as noted in the HPI.    Allergies  No Known Allergies  Past Medical History  Past Medical History:   Diagnosis Date   ??? Arthritis    ??? Pneumothorax    ??? Ulcerative colitis (CMS-HCC)      Past Surgical History   Past Surgical History:   Procedure Laterality Date   ??? BACK SURGERY  1996   ??? COLONOSCOPY     ??? PR COLONOSCOPY W/BIOPSY SINGLE/MULTIPLE Left 07/01/2018    Procedure: COLONOSCOPY, FLEXIBLE, PROXIMAL TO SPLENIC FLEXURE; WITH BIOPSY, SINGLE OR MULTIPLE;  Surgeon: [name], MD;  [facility];  Service: Gastroenterology   ??? PR COLONOSCOPY W/BIOPSY SINGLE/MULTIPLE  10/20/2019    Procedure: COLONOSCOPY, FLEXIBLE, PROXIMAL TO SPLENIC FLEXURE; WITH BIOPSY, SINGLE OR MULTIPLE;  Surgeon: Luanne Bras, MD;  Location: [facility];  Service: Gastroenterology   ??? PR LAP, SURG ENTEROLYSIS Midline 06/29/2020  Procedure: ROBOTIC XI LAPAROSCOPY, SURGICAL, ENTEROLYSIS (FREEING OF INTESTINAL ADHESION) (SEPARATE PROCEDURE);  Surgeon: [name], MD;  Location: MAIN OR [facility];  Service: Gastrointestinal   ??? PR LAP, SURG ENTEROLYSIS Midline 12/12/2020    Procedure: ROBOTIC LAPAROSCOPY, SURGICAL, ENTEROLYSIS (FREEING OF INTESTINAL ADHESION) (SEPARATE PROCEDURE);  Surgeon: [name], MD;  Location: MAIN OR [facility];  Service: Gastrointestinal   ??? PR LAP, SURG PROCTECTOMY W COLOSTOMY Midline 12/12/2020    Procedure: ROBOTIC XI LAPAROSCOPY, SURGICAL; PROCTECTOMY, COMPLETE, COMBINED ABDOMINOPERINEAL, WITH COLOSTOMY;  Surgeon: [name], MD;  Location: MAIN OR [facility];  Service: Gastrointestinal   ??? PR LAP,SURG,COLECTOMY,TOTAL,W/O PROCTECTOMY N/A 06/29/2020    Procedure: ROBOTIC XI LAPAROSCOPY, SURGICAL; COLECTOMY, TOTAL, ABDOMINAL, WITHOUT PROCTECTOMY, WITH ILEOSTOMY OR ILEOPROCTOSTOMY;  Surgeon: [name], MD;  Location: MAIN OR [facility];  Service: Gastrointestinal   ??? PR RESECT SMALL INTEST,SINGL RESEC/ANAS Midline 12/12/2020    Procedure: ENTERECTOMY SM INTES; SNGL RESECT & ANASTOM;  Surgeon: [name], MD;  Location: MAIN OR [place];  Service: Gastrointestinal   ??? PR SIGMOIDOSCOPY,BIOPSY N/A 03/28/2020    Procedure: SIGMOIDOSCOPY, FLEXIBLE; WITH BIOPSY, SINGLE OR MULTIPLE;  Surgeon: Luanne Bras, MD;  Location: [facility] OR [facility];  Service: Gastroenterology     Medications  Current Outpatient Medications   Medication Sig Dispense Refill   ??? fluticasone propionate (FLONASE) 50 mcg/actuation nasal spray 2 sprays into each nostril.     ??? folic acid (FOLVITE) 1 MG tablet Take 1 tablet (1 mg total) by mouth in the morning. 30 tablet 11   ??? HYDROcodone-acetaminophen (NORCO) 5-325 mg per tablet Take 1 tablet by mouth every 6-8 hours as needed for pain. 15 tablet 0   ??? loperamide (IMODIUM) 2 mg capsule Take 1 capsule (2 mg total) by mouth four (4) times a day as needed for diarrhea. You may increase up to 2 capsules four times a day to keep ostomy output around 1 L/day 120 capsule 0   ??? loratadine (CLARITIN) 10 mg tablet Take 10 mg by mouth daily.     ??? melatonin 3 mg Tab Take 1 tablet (3 mg total) by mouth nightly as needed.  0   ??? methocarbamoL (ROBAXIN) 500 MG tablet Take 0.5-1 tablet (250-500mg ) by mouth twice daily as needed for muscle spasms. 10 tablet 0   ??? multivitamin (TAB-A-VITE/THERAGRAN) per tablet Take 1 tablet by mouth daily.     ??? omeprazole (PRILOSEC OTC) 20 MG tablet Take 20 mg by mouth daily. ??? ondansetron (ZOFRAN-ODT) 4 MG disintegrating tablet Dissolve 1 tablet on the tongue every six (6) hours as needed for nausea. 8 tablet 0   ??? pregabalin (LYRICA) 100 MG capsule Take 1 capsule (100 mg total) by mouth Two (2) times a day for 10 days. 20 capsule 0   ??? ferrous sulfate 325 (65 FE) MG tablet Take 325 mg by mouth in the morning.       No current facility-administered medications for this visit.     Family History  Family History   Problem Relation Age of Onset   ??? Cancer Mother      Social History  Social History     Socioeconomic History   ??? Marital status: Married     Spouse name: None   ??? Number of children: None   ??? Years of education: None   ??? Highest education level: None   Tobacco Use   ??? Smoking status: Former     Types: Cigarettes     Quit date: 2006  Years since quitting: 16.8   ??? Smokeless tobacco: Never   Vaping Use   ??? Vaping Use: Never used   Substance and Sexual Activity   ??? Alcohol use: Not Currently   ??? Drug use: No   ??? Sexual activity: Yes     Partners: Female     Objective     Vitals:    12/29/20 0855   BP: 122/75   Pulse: 83   Temp: 36.2 ??C (97.1 ??F)       Physical Exam:  General: alert, well appearing, in no acute distress  Cardiovascular: regular rate, normotensive  Pulmonary: breathing comfortably on room air  Abdominal: soft, nondistended and nontender to palpation. Surgery sites well healed. No dehisence. No erythema or induration. Ileostomy stoma pink, moist, nonprolapsed and mildly retracted but improved after sutures removed.   Extremities: warm and well perfused bilaterally, no edema  Skin: skin color, texture, and turgor are normal. No visible rashes or suspicious lesions  Musculoskeletal: no joint tenderness, deformity, swelling, or muscular tenderness noted. Full range of motion without pain  Neurologic: AAOx3. No visible tremor. Grossly intact neurologically  Psychiatric: alert and oriented to person, place, and time. Judgement and insight are appropriate

## 2020-12-29 NOTE — Unmapped (Signed)
WOCN Consult Services  OSTOMY CLINIC VISIT NOTE     Reason for Consult:   - Ileostomy  - Initial  - Pouching Issue    Problem List:   Active Problems:    * No active hospital problems. *    Assessment: Per EMR, Kevin Tran is a 60 y.o. male ??with a medical history significant for UC who is s/p??robotic, laparoscopic assisted total abdominal colectomy with end Ileostomy??on 06/29/20 who was admitted to the hospital on 12/12/20 for a planned Ileoanal anastomosis(J-pouch surgery) which was converted to an end ileostomy due to concern for poor perfusion of the J-Pouch intraoperatively. Pt reports minimal abdominal pain and leaking from ileostomy site but no other concerns after surgery.  ??  CWOCN clinic visit to discuss pouching issues. Pt has not been able to keep his current pouching system on for more than 24 hrs d/t os at skin level at 12 o'clock and sutures causing puckering at 3 and 9 o'clock. Sutures were removed at bedside by Dr. Miachel Roux today after, denudation noted circumferentially d/t undermining of stool from uneven pouching surface and deep creasing. Anticipate pouching system to wear longer now that sutures have been removed. Demonstrated Marathon application to pt and his wife today with extra sample given to use prior to clinic F/U on 11/28. Barrier ring placed at 3 and 9 o'clock only to minimize height where os is located.  Peristomal skin was shaved prior to replacing pouching system.       Stoma Type:  -  Ileostomy Stoma Location:  - RUQ (Right Upper Quadrant)     Stoma Characteristics:  - Oval  - Minimal height  - Os location 12 o'clock at skin level Stoma Mucosal Condition and Color:  - Moist  - Pink     Mucocutaneous Junction:  - Intact    Rod/Stents:  - No     Output:  - Brown  - Oatmeal consistency  - Stool     Peristomal Skin Condition:   - Erythema  - Denuded  - Treated with Marathon skin protectant  - Location of skin impairment circumferentially    Abdominal Contours:  - Flat  - Firm  - Loose skin  - Deep creasing  - Creasing at 3 and 9 o'clock    Pouching System:  - 1 Piece  - Convex  - CTF (Cut to fit)  - Moldable barrier ring  - Ostomy belt  - soft Anticipated Wear Time of Pouching System:  - To be determined-current wear time is 24 hrs     Teaching/Instructions:  - Crusting peristomal skin irritation.  - Measuring stoma size and cutting skin barrier appropriately.  - Application of moldable barrier ring.  - Peristomal wound care w/Marathon or crusting.  - Reviewed when to contact the outpatient WOC nurse with questions/concerns.    Recommendations/Plan:   - Continue with current pouching system.  - Patient to follow up with outpatient WOC nurse.  - Pt to return to clinic on 11/28 for re-eval.    Plan of Care Discussed With:  - Patient  - Significant other  - LIP Guillem MD.     Sherren Mocha Supplies:   - Marathon sample given to pt for one more application    Ostomy Product List:  Naval architect Convex CTF Pouch 1 1/2 [phone] / [phone])  Copywriter, advertising- ([id number])  Casey Burkitt Adhesive Remover Wipes- [phone] / [phone])  57M No-Sting Barrier Film- Pads- (050338/3344)  Hollister Stoma Powder- (050829/7906)  Hollister Ostomy Belt 23-43- (050984/7300)  Naval architect Strip- (052369/120700)   ??    WOCN Follow Up:  - We will sign off at this time    Workup Time:  45 minutes     Ann (Deya Bigos-Chia) Henderson Newcomer RN, BSN CWOCN [facility]  [phone]  Phone- [phone]

## 2021-01-08 ENCOUNTER — Ambulatory Visit: Admit: 2021-01-08 | Discharge: 2021-01-08 | Payer: PRIVATE HEALTH INSURANCE

## 2021-01-08 DIAGNOSIS — Z9889 Other specified postprocedural states: Principal | ICD-10-CM

## 2021-01-08 DIAGNOSIS — K5130 Ulcerative (chronic) rectosigmoiditis without complications: Principal | ICD-10-CM

## 2021-01-08 MED ORDER — HYDROCODONE 5 MG-ACETAMINOPHEN 325 MG TABLET
ORAL_TABLET | 0 refills | 0 days | Status: CP
Start: 2021-01-08 — End: ?

## 2021-01-08 MED ORDER — LOPERAMIDE 2 MG CAPSULE
ORAL_CAPSULE | Freq: Four times a day (QID) | ORAL | 0 refills | 30 days | Status: CP | PRN
Start: 2021-01-08 — End: 2021-02-07

## 2021-01-08 NOTE — Unmapped (Signed)
LOWER GASTROINTESTINAL SURGERY CLINIC NOTE    Patient Name: Kevin Tran  Patient MRN: [id number]  Date of Service: 01/08/2021  Attending: [name], MD    Reason for Visit   Postoperative follow up  Attending Surgeon A/P     As noted, RTC for further evaluation of discomfort @ ileostomy.   On exam, he looks and feels better than last visit. He was examined with WOCN and ileostomy a bit more upright especially inferiorly. The removal of stitches facilitated a bit more Brooking. A convex appliance also helped.  HE requested  Few more narcotic pills to battle LLQ discomfort. His exam was otherwise normal.   Will obtain a CT scan to w/u LLQ discomfort although I do not feel anything on exam at this time.  He will RTC in 2-4 weeks.  Questions answered to their satisfaction.       I saw and evaluated the patient, participating in the key portions of the service.?? I reviewed the resident???s note.?? I agree with the resident???s findings and plan. Laverda Page, MD        Assessment   Kevin Tran is a 60 y.o. male  with a medical history significant for UC who is s/p??robotic, laparoscopic assisted total abdominal colectomy with end Ileostomy??on 06/29/20 who was admitted to the hospital on 12/12/20 for a planned Ileoanal anastomosis(J-pouch surgery) which was converted to an end ileostomy due to concern for poor perfusion of the J-Pouch intraoperatively. Pt reporting significant LLQ abdominal pain near surgery site and continued high ostomy output but no other concerns after surgery.    Plan   ?? Follow up PRN  ?? CT AP with contrast  ?? Wound ostomy assisted with redness near ostomy today  ?? Refill pain medication and imodium  ?? Referral to pain clinic    Subjective   HPI: Kevin Tran is a 60 y.o. male  with a medical history significant for UC who is s/p??robotic, laparoscopic assisted total abdominal colectomy with end Ileostomy??on 06/29/20 who was admitted to the hospital on 12/12/20 for a planned Ileoanal anastomosis(J-pouch surgery) which was converted to an end ileostomy due to concern for poor perfusion of the J-Pouch intraoperatively. Pt reports feeling stronger daily. Eating well and gaining weight. No associated nausea or vomiting. Pt reporting left lower quadrant abdominal pain near surgical port site. Also endorses pain near ostomy site and states superior skin area near ostomy site is red and inflamed. States he is still having significant ostomy output that has been controlled with 4 immodiums daily. Has to change ostomy bag more frequently compared with last visit. Able to ambulate with no issues.    Review of Systems: A 12 system review of systems was negative except as noted in the HPI.    Allergies  No Known Allergies  Past Medical History  Past Medical History:   Diagnosis Date   ??? Arthritis    ??? Pneumothorax    ??? Ulcerative colitis (CMS-HCC)      Past Surgical History   Past Surgical History:   Procedure Laterality Date   ??? BACK SURGERY  1996   ??? COLONOSCOPY     ??? PR COLONOSCOPY W/BIOPSY SINGLE/MULTIPLE Left 07/01/2018    Procedure: COLONOSCOPY, FLEXIBLE, PROXIMAL TO SPLENIC FLEXURE; WITH BIOPSY, SINGLE OR MULTIPLE;  Surgeon: [name], MD;  Location: [facility];  Service: Gastroenterology   ??? PR COLONOSCOPY W/BIOPSY SINGLE/MULTIPLE  10/20/2019    Procedure: COLONOSCOPY, FLEXIBLE, PROXIMAL TO SPLENIC  FLEXURE; WITH BIOPSY, SINGLE OR MULTIPLE;  Surgeon: Luanne Bras, MD;  Location: [facility];  Service: Gastroenterology   ??? PR LAP, SURG ENTEROLYSIS Midline 06/29/2020    Procedure: ROBOTIC XI LAPAROSCOPY, SURGICAL, ENTEROLYSIS (FREEING OF INTESTINAL ADHESION) (SEPARATE PROCEDURE);  Surgeon: [name], MD;  Location: MAIN OR [facility];  Service: Gastrointestinal   ??? PR LAP, SURG ENTEROLYSIS Midline 12/12/2020    Procedure: ROBOTIC LAPAROSCOPY, SURGICAL, ENTEROLYSIS (FREEING OF INTESTINAL ADHESION) (SEPARATE PROCEDURE);  Surgeon: [name], MD; Location: MAIN OR [facility];  Service: Gastrointestinal   ??? PR LAP, SURG PROCTECTOMY W COLOSTOMY Midline 12/12/2020    Procedure: ROBOTIC XI LAPAROSCOPY, SURGICAL; PROCTECTOMY, COMPLETE, COMBINED ABDOMINOPERINEAL, WITH COLOSTOMY;  Surgeon: [name], MD;  Location: MAIN OR [facility];  Service: Gastrointestinal   ??? PR LAP,SURG,COLECTOMY,TOTAL,W/O PROCTECTOMY N/A 06/29/2020    Procedure: ROBOTIC XI LAPAROSCOPY, SURGICAL; COLECTOMY, TOTAL, ABDOMINAL, WITHOUT PROCTECTOMY, WITH ILEOSTOMY OR ILEOPROCTOSTOMY;  Surgeon: [name], MD;  Location: MAIN OR [facility];  Service: Gastrointestinal   ??? PR RESECT SMALL INTEST,SINGL RESEC/ANAS Midline 12/12/2020    Procedure: ENTERECTOMY SM INTES; SNGL RESECT & ANASTOM;  Surgeon: [name], MD;  Location: MAIN OR [place];  Service: Gastrointestinal   ??? PR SIGMOIDOSCOPY,BIOPSY N/A 03/28/2020    Procedure: SIGMOIDOSCOPY, FLEXIBLE; WITH BIOPSY, SINGLE OR MULTIPLE;  Surgeon: Luanne Bras, MD;  Location: [facility] OR [facility];  Service: Gastroenterology     Medications  Current Outpatient Medications   Medication Sig Dispense Refill   ??? fluticasone propionate (FLONASE) 50 mcg/actuation nasal spray 2 sprays into each nostril.     ??? folic acid (FOLVITE) 1 MG tablet Take 1 tablet (1 mg total) by mouth in the morning. 30 tablet 11   ??? HYDROcodone-acetaminophen (NORCO) 5-325 mg per tablet Take 1 tablet by mouth every 6-8 hours as needed for pain. 15 tablet 0   ??? loperamide (IMODIUM) 2 mg capsule Take 1 capsule (2 mg total) by mouth four (4) times a day as needed for diarrhea. You may increase up to 2 capsules four times a day to keep ostomy output around 1 L/day 120 capsule 0   ??? loratadine (CLARITIN) 10 mg tablet Take 10 mg by mouth daily.     ??? melatonin 3 mg Tab Take 1 tablet (3 mg total) by mouth nightly as needed.  0   ??? methocarbamoL (ROBAXIN) 500 MG tablet Take 0.5-1 tablet (250-500mg ) by mouth twice daily as needed for muscle spasms. 10 tablet 0   ??? multivitamin (TAB-A-VITE/THERAGRAN) per tablet Take 1 tablet by mouth daily.     ??? omeprazole (PRILOSEC OTC) 20 MG tablet Take 20 mg by mouth daily.     ??? ondansetron (ZOFRAN-ODT) 4 MG disintegrating tablet Dissolve 1 tablet on the tongue every six (6) hours as needed for nausea. 8 tablet 0   ??? pregabalin (LYRICA) 100 MG capsule Take 1 capsule (100 mg total) by mouth Two (2) times a day for 10 days. 20 capsule 0     No current facility-administered medications for this visit.     Family History  Family History   Problem Relation Age of Onset   ??? Cancer Mother      Social History  Social History     Socioeconomic History   ??? Marital status: Married     Spouse name: None   ??? Number of children: None   ??? Years of education: None   ??? Highest education level: None   Tobacco Use   ???  Smoking status: Former     Types: Cigarettes     Quit date: 2006     Years since quitting: 16.9   ??? Smokeless tobacco: Never   Vaping Use   ??? Vaping Use: Never used   Substance and Sexual Activity   ??? Alcohol use: Not Currently   ??? Drug use: No   ??? Sexual activity: Yes     Partners: Female     Objective     Vitals:    01/08/21 1057   BP: 118/74   Pulse: 63   Temp: 36.2 ??C (97.1 ??F)       Physical Exam:  General: alert, well appearing, in no acute distress  Cardiovascular: regular rate, normotensive  Pulmonary: breathing comfortably on room air  Abdominal: soft, nondistended and nontender to palpation. Surgery sites well healed. No dehisence. No erythema or induration. Ileostomy stoma pink, moist, nonprolapsed.   Extremities: warm and well perfused bilaterally, no edema  Skin: skin color, texture, and turgor are normal. No visible rashes or suspicious lesions  Musculoskeletal: no joint tenderness, deformity, swelling, or muscular tenderness noted. Full range of motion without pain  Neurologic: AAOx3. No visible tremor. Grossly intact neurologically  Psychiatric: alert and oriented to person, place, and time. Judgement and insight are appropriate

## 2021-01-09 NOTE — Unmapped (Signed)
WOCN Consult Services  OSTOMY CLINIC VISIT NOTE     Reason for Consult:   - Ileostomy  - Pouching Supplies    Problem List:   Active Problems:    * No active hospital problems. *    Assessment:  Per EMR, Kevin Tranis a 60 y.o.??male????with a medical history significant for UC who is s/p??robotic, laparoscopic assisted total abdominal colectomy with end Ileostomy??on 06/29/20 who was admitted to the hospital on 12/12/20 for a planned Ileoanal anastomosis(J-pouch surgery) which was converted to an end ileostomy due to concern for poor perfusion of the J-Pouch intraoperatively.??Pt reports minimal abdominal pain and leaking from ileostomy site but no other concerns after surgery.      WOCN outpatient visit for pouching issues.  Noted that at last OP visit with ostomy nurse, Dr. Miachel Roux removed some sutures around stoma.  Pt's os at skin level at 12 o'clock and pt states he is getting 2-3 days wear time now.  Noted pt has one suture at 9 O'clock.  Noted it to Dr. Miachel Roux, who removed it.      Pt states he has run out of marathon and is unable to properly crust peristomal denudation.  Provided several marathons and had MD write patient an RX for the same.  Again dDemonstrated Marathon application to pt and his wife today and applied appliance without complication    Stoma Type:  -  Ileostomy Stoma Location:  - RUQ (Right Upper Quadrant)     Stoma Characteristics:  - Oval  - Minimal height  - Os location 12 oclock at skin level Stoma Mucosal Condition and Color:  - Moist  - Pink     Mucocutaneous Junction:  - Intact    Rod/Stents:  - No     Output:  - Kevin Tran  - Oatmeal consistency     Peristomal Skin Condition:   - Erythema  - Denuded  - Treated with marathon  - denuded at 9 oclock tiny amt    Abdominal Contours:  - Flat  - Firm  - Loose skin  - Deep creasing  - Creasing at 3 & 9    Pouching System:  - 1 Piece  - Convex  - CTF (Cut to fit)  - Moldable barrier ring  - Ostomy belt  - soft Anticipated Wear Time of Pouching System:  - To be determined     Teaching/Instructions:  - Crusting peristomal skin irritation.  - Measuring stoma size and cutting skin barrier appropriately.  - Application/adjustment of ostomy belt.  - Application of moldable barrier ring.  - Peristomal wound care using crusting with marathon.  - Reviewed when to contact the outpatient WOC nurse with questions/concerns.    Recommendations/Plan:   - Continue with current pouching system.  - f/u with clinic appt as needed    Plan of Care Discussed With:  - Patient  - Family  - LIP Miachel Roux, MD    Ostomy Supplies:   - Ostomy pouching samples provided  - marathon    Ostomy Product List:  Hollister 1-Piece Soft Convex CTF Pouch 1 1/2 [phone] / [phone])  Engineer, technical sales Barrier Ring- ([id number]/[id number])  Casey Burkitt Adhesive Remover Wipes- [id number] / [phone])  2M No-Sting Barrier Film- Pads- ([id number])  Hollister Stoma Powder- ([id number]/[id number])  Hollister Ostomy [place] 23-43- ([id number])  Engineer, technical sales Elastic Strip- ([id number])     WOCN Follow Up:  - We will sign off at this time    Workup Time:  45 minutes  ReConsult or call for questions, new concerns or wound deterioration  Kevin Tran, [address]  [phone]

## 2021-01-09 NOTE — Unmapped (Signed)
Addendum  created 01/08/21 1840 by Raymon Mutton, MD    Attestation recorded in Intraprocedure, Child order released for a procedure order, Clinical Note Signed, Flowsheet accepted, Intraprocedure Attestations filed, Intraprocedure Blocks edited, Pend clinical note, SmartForm saved

## 2021-01-21 ENCOUNTER — Ambulatory Visit: Admit: 2021-01-21 | Discharge: 2021-01-22 | Payer: PRIVATE HEALTH INSURANCE

## 2021-01-21 MED ADMIN — iohexol (OMNIPAQUE) 240 mg iodine/mL oral solution 50 mL: 50 mL | ORAL | @ 17:00:00 | Stop: 2021-01-21

## 2021-01-21 MED ADMIN — iohexoL (OMNIPAQUE) 350 mg iodine/mL solution 100 mL: 100 mL | INTRAVENOUS | @ 17:00:00 | Stop: 2021-01-21

## 2021-01-22 DIAGNOSIS — K5130 Ulcerative (chronic) rectosigmoiditis without complications: Principal | ICD-10-CM

## 2021-01-23 MED ORDER — METHOTREXATE SODIUM 2.5 MG TABLET
ORAL_TABLET | ORAL | 0 refills | 84.00000 days | Status: CP
Start: 2021-01-23 — End: ?

## 2021-01-23 NOTE — Unmapped (Signed)
Reason For Call:  Spoke to pt today regarding re starting methotrexate      Assessment:   Patient had unsuccessful pouch surgery with Dr Miachel Roux on 12/12/20. He states he has been having a lot of trouble with his ostomy leaking, he is seeing WOCN when he has app with Dr Miachel Roux on 01/26/21. His joints have been very painful lately and he would like to restart methotrexate. He has continued his folic acid.       Instructions Provided:  Per Dr Raphael Gibney, ok to resume methotrexate and folic acid ( he has been taking this daily per pt). He understand he will need labs again, but can have these done at his app now scheduled with Dr Raphael Gibney on 03/20/21.     Follow Up:  - Patient verbalized understanding and agreement with plan       Workup Time:   

## 2021-01-26 ENCOUNTER — Ambulatory Visit: Admit: 2021-01-26 | Discharge: 2021-01-27 | Payer: PRIVATE HEALTH INSURANCE

## 2021-01-26 NOTE — Unmapped (Signed)
L[facility] GASTROINTESTINAL SURGERY CLINIC NOTE    Patient Name: Kevin Tran  Patient MRN: [id number]  Date of Service: 01/26/2021  Attending: [name], MD    Reason for Visit   2 week clinic f/u  Attempted J-Pouch converted to end ileostomy 12/12/2020    Attending Surgeon A/P     As noted, doing well. Tolerating a regular diet. Has gained weight. Ileostomy working well. Less spillage.    Plan: increase fiber intake.RTC in 1 month.  I saw and evaluated the patient, participating in the key portions of the service.?? I reviewed the resident???s note.?? I agree with the resident???s findings and plan. Laverda Page, MD        Assessment   Kevin Tran is a 60 y.o. male with hx of UC s/p TAC w/ end ileostomy 06/29/20, attempted J-pouch converted to end ileostomy on 12/12/20.  The patient reports doing better since his office visit on 11/28.  He reports good p.o. intake and has been able to keep a stable weight. Mr. Mckimmy also reported high ostomy output and was advised to supplement his diet with fiber.  Patient expressed desires to explore the possibility of another J-pouch creation if his small bowel descends further in his abdomen in the future.    Plan   ?? RTC 1 month  ?? Fiber supplements    Subjective   HPI: Kevin Tran is a 60 y.o. male with hx of UC s/p TAC w/ end ileostomy 06/29/20, attempted J-pouch converted to end ileostomy on 12/12/20.  The patient reports doing better since his office visit on 11/28.  The patient has been able to maintain his weight and has been taking in good p.o.  He still reports high ostomy output but has been able to keep up with his hydration.    The patient also endorses continued pain around his old port insertion sites.  The pain is most severe when the patient is active but not bothersome at rest.  It has been well controlled with Tylenol.    Leakage around the patient's ostomy site is also reported to have been corrected.    Review of Systems: A 12 system review of systems was negative except as noted in the HPI.    Allergies  No Known Allergies  Past Medical History  Past Medical History:   Diagnosis Date   ??? Arthritis    ??? Pneumothorax    ??? Ulcerative colitis (CMS-HCC)      Past Surgical History   Past Surgical History:   Procedure Laterality Date   ??? BACK SURGERY  1996   ??? COLONOSCOPY     ??? PR COLONOSCOPY W/BIOPSY SINGLE/MULTIPLE Left 07/01/2018    Procedure: COLONOSCOPY, FLEXIBLE, PROXIMAL TO SPLENIC FLEXURE; WITH BIOPSY, SINGLE OR MULTIPLE;  Surgeon: [name], MD;  Location: [facility];  Service: Gastroenterology   ??? PR COLONOSCOPY W/BIOPSY SINGLE/MULTIPLE  10/20/2019    Procedure: COLONOSCOPY, FLEXIBLE, PROXIMAL TO SPLENIC FLEXURE; WITH BIOPSY, SINGLE OR MULTIPLE;  Surgeon: Luanne Bras, MD;  Location: [facility]ED[facility];  Service: Gastroenterology   ??? PR LAP, SURG ENTEROLYSIS Midline 06/29/2020    Procedure: ROBOTIC XI LAPAROSCOPY, SURGICAL, ENTEROLYSIS (FREEING OF INTESTINAL ADHESION) (SEPARATE PROCEDURE);  Surgeon: [name], MD;  Location: MAIN OR [facility];  Service: Gastrointestinal   ??? PR LAP, SURG ENTEROLYSIS Midline 12/12/2020    Procedure: ROBOTIC LAPAROSCOPY, SURGICAL, ENTEROLYSIS (FREEING OF INTESTINAL ADHESION) (SEPARATE PROCEDURE);  Surgeon: [name], MD;  Location: MAIN OR [place];  Service: Gastrointestinal   ??? PR LAP, SURG PROCTECTOMY W COLOSTOMY Midline 12/12/2020    Procedure: ROBOTIC XI LAPAROSCOPY, SURGICAL; PROCTECTOMY, COMPLETE, COMBINED ABDOMINOPERINEAL, WITH COLOSTOMY;  Surgeon: [name], MD;  Location: MAIN OR [place];  Service: Gastrointestinal   ??? PR LAP,SURG,COLECTOMY,TOTAL,W/O PROCTECTOMY N/A 06/29/2020    Procedure: ROBOTIC XI LAPAROSCOPY, SURGICAL; COLECTOMY, TOTAL, ABDOMINAL, WITHOUT PROCTECTOMY, WITH ILEOSTOMY OR ILEOPROCTOSTOMY;  Surgeon: [name], MD;  Location: MAIN OR [facility];  Service: Gastrointestinal   ??? PR RESECT SMALL INTEST,SINGL RESEC/ANAS Midline 12/12/2020 Procedure: ENTERECTOMY SM INTES; SNGL RESECT & ANASTOM;  Surgeon: [name], MD;  Location: MAIN OR [place];  Service: Gastrointestinal   ??? PR SIGMOIDOSCOPY,BIOPSY N/A 03/28/2020    Procedure: SIGMOIDOSCOPY, FLEXIBLE; WITH BIOPSY, SINGLE OR MULTIPLE;  Surgeon: Luanne Bras, MD;  [facility] OR [facility];  Service: Gastroenterology     Medications  Current Outpatient Medications   Medication Sig Dispense Refill   ??? folic acid (FOLVITE) 1 MG tablet Take 1 tablet (1 mg total) by mouth in the morning. 30 tablet 11   ??? loperamide (IMODIUM) 2 mg capsule Take 1 capsule (2 mg total) by mouth four (4) times a day as needed for diarrhea. You may increase up to 2 capsules four times a day to keep ostomy output around 1 L/day 120 capsule 0   ??? loratadine (CLARITIN) 10 mg tablet Take 10 mg by mouth daily.     ??? melatonin 3 mg Tab Take 1 tablet (3 mg total) by mouth nightly as needed.  0   ??? methotrexate 2.5 MG tablet Take 10 tablets (25 mg total) by mouth every seven (7) days. 120 tablet 0   ??? multivitamin (TAB-A-VITE/THERAGRAN) per tablet Take 1 tablet by mouth daily.     ??? omeprazole (PRILOSEC OTC) 20 MG tablet Take 20 mg by mouth daily.     ??? pregabalin (LYRICA) 225 MG capsule Take 225 mg by mouth.     ??? HYDROcodone-acetaminophen (NORCO) 5-325 mg per tablet Take 1 tablet by mouth every 6-8 hours as needed for pain. (Patient not taking: Reported on 01/26/2021) 15 tablet 0   ??? pregabalin (LYRICA) 100 MG capsule Take 1 capsule (100 mg total) by mouth Two (2) times a day for 10 days. 20 capsule 0     No current facility-administered medications for this visit.     Family History  Family History   Problem Relation Age of Onset   ??? Cancer Mother      Social History  Social History     Socioeconomic History   ??? Marital status: Married     Spouse name: None   ??? Number of children: None   ??? Years of education: None   ??? Highest education level: None   Tobacco Use   ??? Smoking status: Former     Types: Cigarettes Quit date: 2006     Years since quitting: 16.9   ??? Smokeless tobacco: Never   Vaping Use   ??? Vaping Use: Never used   Substance and Sexual Activity   ??? Alcohol use: Not Currently   ??? Drug use: No   ??? Sexual activity: Yes     Partners: Female     Objective     Vitals:    01/26/21 1053   BP: 109/72   Pulse: 78   Temp: 36.6 ??C (97.8 ??F)       Physical Exam:  General: alert, well appearing, in no acute distress  Cardiovascular: normotensive  Pulmonary: breathing comfortably on room air  Abdominal: soft, nondistended and nontender to palpation. Scars from previous surgeries are well healed.. Ileostomy stoma pink, moist, nonprolapsed.   Extremities: warm and well perfused bilaterally, no edema  Skin: skin color, texture, and turgor are normal. No visible rashes or suspicious lesions  Musculoskeletal: no joint tenderness, deformity, swelling, or muscular tenderness noted. Full range of motion without pain  Neurologic: AAOx3. No visible tremor. Grossly intact neurologically  Psychiatric: alert and oriented to person, place, and time. Judgement and insight are appropriate

## 2021-02-05 DIAGNOSIS — K5130 Ulcerative (chronic) rectosigmoiditis without complications: Principal | ICD-10-CM

## 2021-02-19 DIAGNOSIS — K5130 Ulcerative (chronic) rectosigmoiditis without complications: Principal | ICD-10-CM

## 2021-02-23 LAB — CBC
HEMATOCRIT: 39.5 % (ref 37.5–51.0)
HEMOGLOBIN: 12.8 g/dL — ABNORMAL LOW (ref 13.0–17.7)
MEAN CORPUSCULAR HEMOGLOBIN CONC: 32.4 g/dL (ref 31.5–35.7)
MEAN CORPUSCULAR HEMOGLOBIN: 27 pg (ref 26.6–33.0)
MEAN CORPUSCULAR VOLUME: 83 fL (ref 79–97)
PLATELET COUNT: 297 10*3/uL (ref 150–450)
RED BLOOD CELL COUNT: 4.74 x10E6/uL (ref 4.14–5.80)
RED CELL DISTRIBUTION WIDTH: 14.4 % (ref 11.6–15.4)
WHITE BLOOD CELL COUNT: 4.9 10*3/uL (ref 3.4–10.8)

## 2021-02-23 LAB — ALT: ALT (SGPT): 20 IU/L (ref 0–44)

## 2021-03-05 ENCOUNTER — Ambulatory Visit: Admit: 2021-03-05 | Discharge: 2021-03-06 | Payer: PRIVATE HEALTH INSURANCE

## 2021-03-05 ENCOUNTER — Ambulatory Visit
Admit: 2021-03-05 | Discharge: 2021-03-06 | Payer: PRIVATE HEALTH INSURANCE | Attending: Anesthesiology | Primary: Anesthesiology

## 2021-03-05 DIAGNOSIS — K5190 Ulcerative colitis, unspecified, without complications: Principal | ICD-10-CM

## 2021-03-05 MED ORDER — 2-OCTYL CYANOACRYLATE TOPICAL TISSUE ADHESIVE
Freq: Every day | TOPICAL | 2 refills | 0 days | Status: CP | PRN
Start: 2021-03-05 — End: ?

## 2021-03-05 MED ORDER — NEOMYCIN 500 MG TABLET
ORAL_TABLET | 0 refills | 0 days | Status: CP
Start: 2021-03-05 — End: ?

## 2021-03-05 MED ORDER — ERYTHROMYCIN 500 MG TABLET
ORAL_TABLET | 0 refills | 0 days | Status: CP
Start: 2021-03-05 — End: ?

## 2021-03-05 MED ORDER — LOPERAMIDE 2 MG TABLET
ORAL_TABLET | Freq: Two times a day (BID) | ORAL | 0 refills | 30.00000 days | Status: CP
Start: 2021-03-05 — End: 2021-04-04

## 2021-03-05 MED ORDER — LOPERAMIDE 2 MG CAPSULE
ORAL_CAPSULE | 0 refills | 0 days
Start: 2021-03-05 — End: ?

## 2021-03-05 NOTE — Unmapped (Signed)
L[facility] GASTROINTESTINAL SURGERY CLINIC NOTE    Patient Name: Kevin Tran  Patient MRN: [id number]  Date of Service: 03/05/2021  Attending: [name], MD    Reason for Visit   1 month follow up     Attending Surgeon A/P     As noted, on exam he looks and overall feels well but high ileostomy output is debilitating He also complaints of pelvic pressure.  Stoma is upright and per patient and wife input, it is working fine. Abdomen is benign. Perianal exam is benign. DRE does not reveal any pathology, no BRBPR.    Will obtain updated lab work and pelvic imaging to check electrolytes given high output as well as r/o a collection, although not likely given his overall healthy appearence. Will add imodium to help with high output. Will be seeing Dr Raphael Gibney in coming weeks and will reach out to him re possible EGD to evaluate persistent nausea.    Questions answered.Will RTC in 1 month.      I saw and evaluated the patient, participating in the key portions of the service.?? I reviewed the resident???s note.?? I agree with the resident???s findings and plan. Laverda Page, MD      Assessment   Kevin Tran is a 61 y.o. male with hx of UC s/p TAC w/ end ileostomy 06/29/20, attempted J-pouch converted to end ileostomy on 12/12/20 who presents for 1 month follow-up.  Patient continues to complain of high ostomy output and perirectal pain that is debilitating.  DRE negative for masses or blood, no abdominal pain, stoma pink nonprolapsed.  We will proceed with labs today.  Patient is scheduled to follow-up with Dr. Raphael Gibney (GI) on February 7.  Given complaints of persistent nausea after meals, EGD may be beneficial per GI.  We will obtain CT of pelvis with contrast to rule out fluid collection that may be causing rectal irritation and pain.  We will see him back in 1 month for recheck.    Plan   ?? Recheck 1 month  ?? CBC/BMP/mag/Phos  ?? CT pelvis with contrast (patient prefers CT to be performed at [facility])  ?? Loperamide 2 mg twice daily, increase Metamucil to 8 capsules/day  ?? F/u with Dr. Raphael Gibney (GI), may benefit from EGD to evaluate source of persistent nausea     Subjective   HPI: Kevin Tran is a 61 y.o. male with hx of UC s/p TAC w/ end ileostomy 06/29/20, attempted J-pouch converted to end ileostomy on 12/12/20.  Patient reports no improvement in ostomy output from prior visit.  Continues to change his bag approximately 10 times per day with thin watery output, cannot quantify volume out. Taking 6 capsule of Metamucil daily without improvement in stool consistency. He has noticed persistent rectal pain over the past couple of weeks.  He describes the pain as a sharp spasm and feels that he needs to have a bowel movement per rectum.  He denies drainage or blood per rectum.  He continues to have good p.o. intake, weight stable from prior, however he is complaining of nausea after each meal.  No episodes of emesis.  He is scheduled to see is GI doctor in February.    On review of systems, he denies fevers, chills, vomiting, abdominal pain, dysuria.    Review of Systems: A 12 system review of systems was negative except as noted in the HPI.    Allergies  No Known Allergies  Past Medical History  Past  Medical History:   Diagnosis Date   ??? Arthritis    ??? Pneumothorax    ??? Ulcerative colitis (CMS-HCC)      Past Surgical History   Past Surgical History:   Procedure Laterality Date   ??? BACK SURGERY  1996   ??? COLONOSCOPY     ??? PR COLONOSCOPY W/BIOPSY SINGLE/MULTIPLE Left 07/01/2018    Procedure: COLONOSCOPY, FLEXIBLE, PROXIMAL TO SPLENIC FLEXURE; WITH BIOPSY, SINGLE OR MULTIPLE;  Surgeon: [name], MD;  Location: [facility] PROCED[facility];  Service: Gastroenterology   ??? PR COLONOSCOPY W/BIOPSY SINGLE/MULTIPLE  10/20/2019    Procedure: COLONOSCOPY, FLEXIBLE, PROXIMAL TO SPLENIC FLEXURE; WITH BIOPSY, SINGLE OR MULTIPLE;  Surgeon: Luanne Bras, MD;  Location: [facility] PROCED[facility];  Service: Gastroenterology   ??? PR LAP, SURG ENTEROLYSIS Midline 06/29/2020    Procedure: ROBOTIC XI LAPAROSCOPY, SURGICAL, ENTEROLYSIS (FREEING OF INTESTINAL ADHESION) (SEPARATE PROCEDURE);  Surgeon: [name], MD;  Location: MAIN OR [facility];  Service: Gastrointestinal   ??? PR LAP, SURG ENTEROLYSIS Midline 12/12/2020    Procedure: ROBOTIC LAPAROSCOPY, SURGICAL, ENTEROLYSIS (FREEING OF INTESTINAL ADHESION) (SEPARATE PROCEDURE);  Surgeon: [name], MD;  Location: MAIN OR [facility];  Service: Gastrointestinal   ??? PR LAP, SURG PROCTECTOMY W COLOSTOMY Midline 12/12/2020    Procedure: ROBOTIC XI LAPAROSCOPY, SURGICAL; PROCTECTOMY, COMPLETE, COMBINED ABDOMINOPERINEAL, WITH COLOSTOMY;  Surgeon: [name], MD;  Location: MAIN OR [facility];  Service: Gastrointestinal   ??? PR LAP,SURG,COLECTOMY,TOTAL,W/O PROCTECTOMY N/A 06/29/2020    Procedure: ROBOTIC XI LAPAROSCOPY, SURGICAL; COLECTOMY, TOTAL, ABDOMINAL, WITHOUT PROCTECTOMY, WITH ILEOSTOMY OR ILEOPROCTOSTOMY;  Surgeon: [name], MD;  Location: MAIN OR [facility];  Service: Gastrointestinal   ??? PR RESECT SMALL INTEST,SINGL RESEC/ANAS Midline 12/12/2020    Procedure: ENTERECTOMY SM INTES; SNGL RESECT & ANASTOM;  Surgeon: [name], MD;  Location: MAIN OR [place];  Service: Gastrointestinal   ??? PR SIGMOIDOSCOPY,BIOPSY N/A 03/28/2020    Procedure: SIGMOIDOSCOPY, FLEXIBLE; WITH BIOPSY, SINGLE OR MULTIPLE;  Surgeon: Luanne Bras, MD;  Location: [facility] OR [facility];  Service: Gastroenterology     Medications  Current Outpatient Medications   Medication Sig Dispense Refill   ??? folic acid (FOLVITE) 1 MG tablet Take 1 tablet (1 mg total) by mouth in the morning. 30 tablet 11   ??? loratadine (CLARITIN) 10 mg tablet Take 10 mg by mouth daily.     ??? melatonin 3 mg Tab Take 1 tablet (3 mg total) by mouth nightly as needed.  0   ??? methotrexate 2.5 MG tablet Take 10 tablets (25 mg total) by mouth every seven (7) days. 120 tablet 0   ??? multivitamin (TAB-A-VITE/THERAGRAN) per tablet Take 1 tablet by mouth daily.     ??? omeprazole (PRILOSEC OTC) 20 MG tablet Take 20 mg by mouth daily.     ??? pregabalin (LYRICA) 225 MG capsule Take 225 mg by mouth.       No current facility-administered medications for this visit.     Family History  Family History   Problem Relation Age of Onset   ??? Cancer Mother      Social History  Social History     Socioeconomic History   ??? Marital status: Married     Spouse name: None   ??? Number of children: None   ??? Years of education: None   ??? Highest education level: None   Tobacco Use   ??? Smoking status: Former     Types: Cigarettes     Quit date:  2006     Years since quitting: 17.0   ??? Smokeless tobacco: Never   Vaping Use   ??? Vaping Use: Never used   Substance and Sexual Activity   ??? Alcohol use: Not Currently   ??? Drug use: No   ??? Sexual activity: Yes     Partners: Female     Objective     Vitals:    03/05/21 1021   BP: 122/84   Pulse: 76   Temp: 36.3 ??C (97.3 ??F)       Physical Exam:  General: alert, well appearing, in no acute distress  Cardiovascular: regular rate, normotensive  Pulmonary: breathing comfortably on room air  Abdominal: soft, nondistended and nontender to palpation. Ostomy pink, non prolapsed. Stool and gas in bag. Incisions well healed   Rectal: normal tone, no blood upon DRE, no masses.   Extremities: warm and well perfused bilaterally, no edema  Skin: skin color, texture, and turgor are normal. No visible rashes or suspicious lesions  Musculoskeletal: no joint tenderness, deformity, swelling, or muscular tenderness noted. Full range of motion without pain  Neurologic: AAOx3. No visible tremor. Grossly intact neurologically  Psychiatric: alert and oriented to person, place, and time. Judgement and insight are appropriate

## 2021-03-08 MED ORDER — LOPERAMIDE 2 MG CAPSULE
ORAL_CAPSULE | 0 refills | 0 days | Status: CP
Start: 2021-03-08 — End: ?

## 2021-03-09 DIAGNOSIS — U071 COVID-19: Principal | ICD-10-CM

## 2021-03-09 MED ORDER — PAXLOVID 300 MG (150 MG X 2)-100 MG TABLETS IN A DOSE PACK (EUA)
ORAL_TABLET | 0 refills | 0 days | Status: CP
Start: 2021-03-09 — End: ?

## 2021-03-09 NOTE — Unmapped (Signed)
Patient called with update, started feeling crummy yesterday, COVID+ today. On methotrexate, dosing on Tuesdays.  He received infusion treatment last time he was positive and got better quickly per his report.     Dr. Raphael Gibney notified. Script for Paxlovid sent with instructions to start asap.  Hold 1 dose of methotrexate. Tylenol, fluids, rest  ER for severe symptoms, SOB, etc. Patient verbalized understanding and in agreement with plan.

## 2021-03-16 ENCOUNTER — Ambulatory Visit: Admit: 2021-03-16 | Discharge: 2021-03-17 | Payer: PRIVATE HEALTH INSURANCE

## 2021-03-16 MED ADMIN — iohexoL (OMNIPAQUE) 350 mg iodine/mL solution 75 mL: 75 mL | INTRAVENOUS | @ 21:00:00 | Stop: 2021-03-16

## 2021-03-20 ENCOUNTER — Telehealth: Admit: 2021-03-20 | Discharge: 2021-03-21 | Payer: PRIVATE HEALTH INSURANCE

## 2021-03-20 DIAGNOSIS — K51919 Ulcerative colitis, unspecified with unspecified complications: Principal | ICD-10-CM

## 2021-03-20 MED ORDER — DIPHENOXYLATE-ATROPINE 2.5 MG-0.025 MG TABLET
ORAL_TABLET | Freq: Four times a day (QID) | ORAL | 5 refills | 30.00000 days | Status: CP
Start: 2021-03-20 — End: 2021-04-19

## 2021-03-20 NOTE — Unmapped (Signed)
[facility]   FOLLOW UP NOTE - INFLAMMATORY BOWEL DISEASE  03/20/2021    Demographics:  Kevin Tran is a 61 y.o. year old male    Diagnosis:  Ulcerative Colitis  Disease onset (yr):  2018  Age at onset:  > 59yr old (A3)  Location:  Left-sided (E2)  Behavior:  Colitis  Current Tight Control Scenario:   Maintenance = Biologic/small molecule          HPI / NOTE :     VIDEO VISIT DUE TO COVID19    Interval Events:   1.  Last seen Sep 2022.  Since then he had an attempted IPAA surgery in Nov.  However, there were complications during the surgery including a bleeding vessel and there was concern for pouch ischemia.  Hence pouch creation was aborted and patient was given an end ileostomy.     HPI:   Improving from the COVID infection, still has a lingering headache at this point.     Still has trouble with the ostomy.  The ostomy is fairly flat and hence difficult getting a good seal. Has a good appetite.   Has a lot of ostomy output. Emptying ostomy bag >10x/day.   He is taking 6-8 imodium tablets per day. Also taking metamucil.      Abdominal pain (0-10): occasional, minimal  BM a day: empties ostomy bag 5-6x/day, half full each time  Consistency: loose  % of stools have blood: 0%  Nocturnal BM: NA ostomy  Urgency: NA, ostomy  Weight change over last 6 mo:  Stable ~155-160lb (not gaining but not losing)  Smoking: no  NSAIDS: avoids    Review of Systems:   Review of systems positive for: fatigue  Otherwise, the balance of 10 systems is negative           IBD HISTORY:     Year of disease onset:  2018    Brief IBD Disease Course:  In late 2018 developed bloody diarrhea and incontinence. Colonoscopy in Feb 2019 showed edema and erythema in the rectum and sigmoid with decreased vascularity in the rest of the colon; the TI appeared normal. He was treated with courses of prednisone for much of 2019 and also put on mesalamine without improvement. In Fall 2019 he was started on Humira w/o improvement. In Dec 2019 he had moderate level antibodies to humira; was added; mesalamine was stopped; put on prednisone taper; rapidly improved  - Jan 2020 - stopped mesalamine, used weekly Humira + with initial improvement.   - April 2020 - notable worsening of diarrhea and abdominal pain  Colonoscopy with moderate inflammation mainly in rectum.  Changed to [name] 06/2018.  Had limited response initially, but improved with Uceris foam 09/2018.    - 09/2018 - taper to xeljanz 5mg  BID.  Nov 2020 - ongoing proctitis symptoms, restarted Uceris foam.   - 06/2019 - severe UC flare on [name], plan to restage disease and change therapy  - 10/2019 - moderate proctitis (up to ~20cm), started canasa + Stelara.  Good symptom response to Stelara initially, but had severe arthralgias.   - Feb 2022 - worsening UC flare despite Stelara.  Admitted to [facility], discharged on prednisone taper.  Recurrent symptoms, readmitted 1 week later, started on IV Remicade 5mg /kg in the hospital on 04/06/2020. Low trough level April 2022 - increased dose to 10mg /kg.    - May 2022 - poor response to high dose Remicade.  Colectomy 06/29/2020 ([facility]). Surg  path - severe chronic active colitis c/w UC.    Endoscopy:      - Colonoscopy Feb 2019: edema + erythema in rectum and sigmoid; decreased vascularity in rest of colon  - Dec 2019: moderate inflammation throughout the colon  - Colonoscopy 07/01/2018 - poor prep, moderate proctitis up to 15cm, mild patchy nodularity in remaining colon.  Normal terminal ileum.   - Colonoscopy 10/20/2019 - hemorrhoids.  Moderate (Mayo 2) colitis from anal verge up to 10cm.  Mild colitis from 10-20cm, then normal colon.  Normal terminal ileum.    PATH:  Moderately active chronic colitis  - Sigmoidoscopy 03/28/2020 - moderate (Mayo 2) colitis through the extent of the exam (up to the descending colon at least).    PATH:  Severe chronic active colitis with no CMV    Imaging:    - CT 01/28/18: left sided colitis; liver lesion (see below)  - MRI abdomen to evaluate liver lesion 02/09/18 - aforementioned liver lesion felt to be focal fatty infiltration;  6 month repeat recommended    Prior IBD medications (type, dose, duration, response):  [x]  5-ASAs - Mesalamine  - no improvement; possible paradoxical diarrhea  [x]  Oral corticosteroids - prednisone.  Good response with budesonide foam.   []  Intravenous corticosteroids  []  Antibiotics  [x]  Thiopurines: started in Dec 2019  []  Methotrexate  [x]  Anti-TNF therapies - Humira since Sept or Oct 2019. Low level and low titer Ab in Dec 2019. Increased to weekly dosing. Stopped spring 2020 due to non-response despite good drug levels.   []  Anti-Integrin therapies  []  Anti-Interleukin therapies  [x]  Anti-JAK therapies - Xeljanz 10mg  started 06/2018, tapered to 5mg  bid 09/2018  []  Cyclosporine  []  Clinical trial medication  []  Other (Please specify):    Extraintestinal manifestations:   -joint pains affecting: y, peripheral arthralgias  -eye: n  -skin: n  -oral ulcers :  n  -blood clots: n  -PSC: n  -other: n          Past Medical History:   Past medical history:   Past Medical History:   Diagnosis Date   ??? Arthritis    ??? Pneumothorax    ??? Ulcerative colitis (CMS-HCC)      Past surgical history:   Past Surgical History:   Procedure Laterality Date   ??? BACK SURGERY  1996   ??? COLONOSCOPY     ??? PR COLONOSCOPY W/BIOPSY SINGLE/MULTIPLE Left 07/01/2018    Procedure: COLONOSCOPY, FLEXIBLE, PROXIMAL TO SPLENIC FLEXURE; WITH BIOPSY, SINGLE OR MULTIPLE;  Surgeon: [name], MD;  [facility];  Service: Gastroenterology   ??? PR COLONOSCOPY W/BIOPSY SINGLE/MULTIPLE  10/20/2019    Procedure: COLONOSCOPY, FLEXIBLE, PROXIMAL TO SPLENIC FLEXURE; WITH BIOPSY, SINGLE OR MULTIPLE;  Surgeon: Luanne Bras, MD;  Location: [facility];  Service: Gastroenterology   ??? PR LAP, SURG ENTEROLYSIS Midline 06/29/2020    Procedure: ROBOTIC XI LAPAROSCOPY, SURGICAL, ENTEROLYSIS (FREEING OF INTESTINAL ADHESION) (SEPARATE PROCEDURE);  Surgeon: [name], MD;  Location: MAIN OR [facility];  Service: Gastrointestinal   ??? PR LAP, SURG ENTEROLYSIS Midline 12/12/2020    Procedure: ROBOTIC LAPAROSCOPY, SURGICAL, ENTEROLYSIS (FREEING OF INTESTINAL ADHESION) (SEPARATE PROCEDURE);  Surgeon: [name], MD;  Location: [facility];  Service: Gastrointestinal   ??? PR LAP, SURG PROCTECTOMY W COLOSTOMY Midline 12/12/2020    Procedure: ROBOTIC XI LAPAROSCOPY, SURGICAL; PROCTECTOMY, COMPLETE, COMBINED ABDOMINOPERINEAL, WITH COLOSTOMY;  Surgeon: [name], MD;  Location: MAIN OR [place];  Service: Gastrointestinal   ??? PR LAP,SURG,COLECTOMY,TOTAL,W/O PROCTECTOMY N/A 06/29/2020    Procedure: ROBOTIC XI LAPAROSCOPY, SURGICAL; COLECTOMY, TOTAL, ABDOMINAL, WITHOUT PROCTECTOMY, WITH ILEOSTOMY OR ILEOPROCTOSTOMY;  Surgeon: [name], MD;  Location: MAIN OR [facility];  Service: Gastrointestinal   ??? PR RESECT SMALL INTEST,SINGL RESEC/ANAS Midline 12/12/2020    Procedure: ENTERECTOMY SM INTES; SNGL RESECT & ANASTOM;  Surgeon: [name], MD;  Location: MAIN OR [place];  Service: Gastrointestinal   ??? PR SIGMOIDOSCOPY,BIOPSY N/A 03/28/2020    Procedure: SIGMOIDOSCOPY, FLEXIBLE; WITH BIOPSY, SINGLE OR MULTIPLE;  Surgeon: Luanne Bras, MD;  [facility];  Service: Gastroenterology     Family history:   Family History   Problem Relation Age of Onset   ??? Cancer Mother      Social history:   Social History     Socioeconomic History   ??? Marital status: Married   Tobacco Use   ??? Smoking status: Former     Types: Cigarettes     Quit date: 2006     Years since quitting: 17.1   ??? Smokeless tobacco: Never   Vaping Use   ??? Vaping Use: Never used   Substance and Sexual Activity   ??? Alcohol use: Not Currently   ??? Drug use: No   ??? Sexual activity: Yes     Partners: Female             Allergies:   No Known Allergies          Medications:     Current Outpatient Medications   Medication Sig Dispense Refill   ??? 2-octyl cyanoacrylate Liqd Apply 10 applicators topically as needed. 10 each 2   ??? diphenoxylate-atropine (LOMOTIL) 2.5-0.025 mg per tablet Take 2 tablets by mouth four (4) times a day. 240 tablet 5   ??? erythromycin base (E-MYCIN) 500 MG tablet Take 2 tablets at 2:00 pm, 3:00 pm and 10:00 pm the day before your surgery. 6 tablet 0   ??? folic acid (FOLVITE) 1 MG tablet Take 1 tablet (1 mg total) by mouth in the morning. 30 tablet 11   ??? loperamide (IMODIUM) 2 mg capsule TAKE 1 CAPSULE BY MOUTH TWICE DAILY 180 capsule 0   ??? loratadine (CLARITIN) 10 mg tablet Take 10 mg by mouth daily.     ??? melatonin 3 mg Tab Take 1 tablet (3 mg total) by mouth nightly as needed.  0   ??? methotrexate 2.5 MG tablet Take 10 tablets (25 mg total) by mouth every seven (7) days. 120 tablet 0   ??? multivitamin (TAB-A-VITE/THERAGRAN) per tablet Take 1 tablet by mouth daily.     ??? neomycin (MYCIFRADIN) 500 mg tablet Take 2 tablets at 2:00 pm, 3:00 pm and 10:00 pm the day before surgery. 6 tablet 0   ??? omeprazole (PRILOSEC OTC) 20 MG tablet Take 20 mg by mouth daily.     ??? PAXLOVID CO-PACK, EUA, 300 mg (150 mg x 2)-100 mg tablet See package instructions. 30 tablet 0   ??? pregabalin (LYRICA) 225 MG capsule Take 225 mg by mouth.       No current facility-administered medications for this visit.             Physical Exam:   There were no vitals taken for this visit.    VIDEO VISIT - no vitals and limited physical exam    GEN: middle aged male in no apparent distress, appears comfortable on exam  HEENT: eyes normal and symmetric  NEURO:  Normal speech, face symmetric  PULM:  Respirations comfortable  SKIN:  No visible rash on face/neck  Psych: affect appropriate, A&O x3          Labs, Data & Indices:     Lab Review:   Lab Results   Component Value Date    WBC 4.9 02/22/2021    RBC 4.74 02/22/2021    HGB 12.8 (L) 02/22/2021     Lab Results   Component Value Date    AST 19 11/24/2020    AST 19 11/14/2020 ALT 20 02/22/2021    BUN 20 12/22/2020    BUN 14 08/04/2019    Creatinine Whole Blood, POC 1.1 03/16/2021    CO2 25.0 12/22/2020    CO2 24 08/04/2019    Albumin 4.1 11/24/2020    Calcium 10.1 12/22/2020    Calcium 10.0 08/04/2019     No results found for: TSH   ...................................................................................................  ............................................................................................................................................  ORDERS THIS VISIT:       Diagnosis ICD-10-CM Associated Orders   1. Ulcerative colitis with complication, unspecified location (CMS-HCC)  K51.919               Assessment & Recommendations:   Disease state:    Kevin Tran is a 61 y.o. male with ulcerative pancolitis (mostly proctosigmoiditis) since 2018. He was started on Humira in 2019. By April 2020, he had a mechanistic non-response to Humira (good drug level but moderate to severe proctitis).  Since then he has had suboptimal response to [place] and Ustekinumab. He had a severe UC flare in Feb 2022 and was admitted to [facility]. We started on Infliximab 04/06/2020 but he had worsening colitis despite ~10 weeks of Infliximab therapy and dose escalation.  Hence, we proceeded with surgery (06/29/20) and he is now status post colectomy with an end ileostomy.  Unfortunately he had an attempted IPAA surgery in November but there were intra-operative complications (bleeding, dusky appearance of the pouch) and the pouch creation had to be aborted.  He has a new ileostomy - but is having difficulty with the the new stoma given that it is flat and almost retracted.      I had a long discussion with the patient and his wife today about the potential options moving forward.  He had an unfortunate complication during his attempted pouch surgery in November and is in a difficult situation.  He is understandably frustrated about the situation.  Recommended that it would be reasonable for him to consider a surgical second opinion to discuss if a salvage pouch creation would be a reasonable option.  I suggested one excellent option would be to see Dr. Beatrice Lecher at [facility] for a surgical second opinion.  If he is not able to make it to [place], we could also refer him to see one of our surgeons at [facility] [place], such as Dr. Peri Jefferson or Dr. Elenore Rota.  Patient is not ready to see a surgeon at this time but will think this over.  I asked him to be in touch with me and I be happy to coordinate referral to either of the surgeons or another surgeon of his choice (at least for an office consultation).     I also spent some time discussing his difficulties with the stoma and high ostomy output.  I recommended increasing the Imodium to 8 tablets daily and I have also sent in a prescription for Lomotil.  He will start Lomotil 2 tablets daily and work up  to 8 tablets/day.  If he ultimately decides not to pursue a repeat attempt at a pouch surgery, then we could consider surgery with a stoma revision.    IBD health maintenance:  Influenza vaccine: given in Fall 2019  Pneumonia vaccine:   Hepatitis B: received twinrix 2 of 3;   TB testing: Quant gold neg in March 2019  Chickenpox/Shingles history: Shingrix completed 09/2018  Bone denistometry:   Derm appointment:  Last colonoscopy: 10/2019  --------------------------------------------------------  Author: [name] 03/20/2021 5:39 PM    [name], MD  Assistant Professor of Medicine  Division of Gastroenterology & Hepatology  [place] [facility] [place] [facility] [place]  ====================================================      The patient reports they are currently: at home. I spent 32 minutes on the real-time audio and video visit with the patient on the date of service. I spent an additional 10 minutes on pre- and post-visit activities on the date of service.     The patient was not located and I was located within 250 yards of a hospital based location during the real-time audio and video visit. The patient was physically located in [place] or a state in which I am permitted to provide care. The patient and/or parent/guardian understood that s/he may incur co-pays and cost sharing, and agreed to the telemedicine visit. The visit was reasonable and appropriate under the circumstances given the patient's presentation at the time.    The patient and/or parent/guardian has been advised of the potential risks and limitations of this mode of treatment (including, but not limited to, the absence of in-person examination) and has agreed to be treated using telemedicine. The patient's/patient's family's questions regarding telemedicine have been answered.    If the visit was completed in an ambulatory setting, the patient and/or parent/guardian has also been advised to contact their provider???s office for worsening conditions, and seek emergency medical treatment and/or call 911 if the patient deems either necessary.

## 2021-03-21 NOTE — Unmapped (Signed)
REASON FOR VISIT: outpatient consult at the request of Dr. [name] for evaluation of arthralgias.     IMPRESSION: Kevin Tran is a 61 y.o. male with severe ulcerative colitis, unresponsive to multiple immunosuppressive agents, requiring TAC w/ end ileostomy 06/29/20, and history of longstanding neck and lower back pain s/p lumbar spine surgery in 1996 who is referred for evaluation of arthralgias.   The patient reports chronic pain involving the neck, shoulders, upper back, with associated burning sensation that radiates from neck to his hands as well as chronic low back pain and left knee pain.   He has never experienced swelling, redness, or warmth of any joint and never experienced relief of his chronic joint and spinal pain with immunosuppressive medications used for his UC.   Additionally, serologies obtained at [facility] in 2020 showed negative ANA, dsDNA, and RF. On exam today, there is no evidence of inflammatory arthropathy. There is evidence of mild osteoarthritic changes of the hands and he has limited extension of the neck. In summary, I have low index of suspicion for inflammatory arthropathy and I suspect that his upper extremity pain is more neuropathic in nature. He also denies abnormal sleep patterns and has no hyperalgesia on exam to suggest centrally mediated pain or Fibromyalgia. If the methotrexate is being used for joint pain, it can be discontinued as it has not helped.     PLAN:  ??? Will screen him for enteropathic arthritis and obtain XRs of the SI joints. Will also XR cervical spine to evaluate for progression of DDD. Schedule him for EMG to evaluate for cervical radiculopathy/myelopathy and peripheral neuropathy. I agreed to continue Lyrica. Continue to avoid NSAIDs. He has not seen benefit from Tylenol. His management will be decided based on these results.    I personally spent 60 minutes face-to-face and non-face-to-face in the care of this patient, which includes all pre, intra, and post visit time on the date of service.      Return in about 6 months (around 09/19/2021).     Scribe's Attestation: Nelda Bucks MD obtained and performed the history, physical exam and medical decision making elements that were entered into the chart.  Signed by Lauralee Evener, Scribe, on March 22, 2021 11:56 AM.    The documentation recorded by the scribe accurately reflects the service I personally performed and the decisions made by me. Lillia Pauls, MD  _________________________________________________________________________________    Chief Complaint: bilateral hand burning pain    HPI:  Kevin Tran is a 61 y.o. male who presents for evaluation of arthralgia and ulcerative colitis. Started methotrexate.    Referred by GI for evaluation of joint pain.    Colon taking out, had surgery to reconnect small intestine.    A few years ago, he started having pain in his shoulders with mild pressure. Doctors believed fibromyalgia, started on Lyrica. Diagnosed with ulcerative colitis soon after. Now has pain in wrists, hands, L hip and knee, shoulders, and back. Did not feel joint pain improvement on medications including infliximab, Humira, and Xeljanz. Started methotrexate a year ago, has not seen improvement. He has never had swelling in his joints. Reports burning pain in his entire hands in the morning and shooting pains down his arms. He has trouble gripping objects. Does not get pain in his feet. Back pain is worse in the morning. Pain improves with activity. He has tried PT for pain, but has not helped. He was referred to pain specialist, but has  not scheduled an appointment. Normally has good sleep quality. Sleeps with wrist braces that help his pain. Reports frequent headaches.       Outside and [facility] records obtained from [facility] were reviewed in detail today.     LABS/IMAGING:    EXAM: Magnetic resonance imaging, spinal canal and contents, cervical without contrast material.  DATE: 07/23/2016    IMPRESSION:  Small disc protrusion at C5-6. No significant spinal canal or foraminal narrowing.      Hospital Outpatient Visit on 03/16/2021   Component Date Value   ??? Creatinine Whole Blood, * 03/16/2021 1.1    ??? eGFR CKD-EPI (2021) Male 03/16/2021 76    Ancillary Orders on 02/19/2021   Component Date Value   ??? ALT 02/22/2021 20    ??? WBC 02/22/2021 4.9    ??? RBC 02/22/2021 4.74    ??? HGB 02/22/2021 12.8 (L)    ??? HCT 02/22/2021 39.5    ??? MCV 02/22/2021 83    ??? MCH 02/22/2021 27.0    ??? MCHC 02/22/2021 32.4    ??? RDW 02/22/2021 14.4    ??? Platelet 02/22/2021 297      ROS: All systems were reviewed and negative except as noted in the HPI.    Past Medical History:   Diagnosis Date   ??? Arthritis    ??? Pneumothorax    ??? Ulcerative colitis (CMS-HCC)        Past Surgical History:   Procedure Laterality Date   ??? BACK SURGERY  1996   ??? COLONOSCOPY     ??? PR COLONOSCOPY W/BIOPSY SINGLE/MULTIPLE Left 07/01/2018    Procedure: COLONOSCOPY, FLEXIBLE, PROXIMAL TO SPLENIC FLEXURE; WITH BIOPSY, SINGLE OR MULTIPLE;  Surgeon: [name], MD;  Location: [facility] PROCEDURES [facility];  Service: Gastroenterology   ??? PR COLONOSCOPY W/BIOPSY SINGLE/MULTIPLE  10/20/2019    Procedure: COLONOSCOPY, FLEXIBLE, PROXIMAL TO SPLENIC FLEXURE; WITH BIOPSY, SINGLE OR MULTIPLE;  Surgeon: Luanne Bras, MD;  Location: [facility] PROCEDURES [facility];  Service: Gastroenterology   ??? PR LAP, SURG ENTEROLYSIS Midline 06/29/2020    Procedure: ROBOTIC XI LAPAROSCOPY, SURGICAL, ENTEROLYSIS (FREEING OF INTESTINAL ADHESION) (SEPARATE PROCEDURE);  Surgeon: [name], MD;  Location: MAIN OR [facility];  Service: Gastrointestinal   ??? PR LAP, SURG ENTEROLYSIS Midline 12/12/2020    Procedure: ROBOTIC LAPAROSCOPY, SURGICAL, ENTEROLYSIS (FREEING OF INTESTINAL ADHESION) (SEPARATE PROCEDURE);  Surgeon: [name], MD;  Location: MAIN OR [facility];  Service: Gastrointestinal   ??? PR LAP, SURG PROCTECTOMY W COLOSTOMY Midline 12/12/2020 Procedure: ROBOTIC XI LAPAROSCOPY, SURGICAL; PROCTECTOMY, COMPLETE, COMBINED ABDOMINOPERINEAL, WITH COLOSTOMY;  Surgeon: [name], MD;  Location: MAIN OR [facility];  Service: Gastrointestinal   ??? PR LAP,SURG,COLECTOMY,TOTAL,W/O PROCTECTOMY N/A 06/29/2020    Procedure: ROBOTIC XI LAPAROSCOPY, SURGICAL; COLECTOMY, TOTAL, ABDOMINAL, WITHOUT PROCTECTOMY, WITH ILEOSTOMY OR ILEOPROCTOSTOMY;  Surgeon: [name], MD;  Location: MAIN OR [facility];  Service: Gastrointestinal   ??? PR RESECT SMALL INTEST,SINGL RESEC/ANAS Midline 12/12/2020    Procedure: ENTERECTOMY SM INTES; SNGL RESECT & ANASTOM;  Surgeon: [name], MD;  Location: MAIN OR [place];  Service: Gastrointestinal   ??? PR SIGMOIDOSCOPY,BIOPSY N/A 03/28/2020    Procedure: SIGMOIDOSCOPY, FLEXIBLE; WITH BIOPSY, SINGLE OR MULTIPLE;  Surgeon: Luanne Bras, MD;  Location: [facility] OR [facility];  Service: Gastroenterology       Family History   Problem Relation Age of Onset   ??? Cancer Mother        Social History     Socioeconomic History   ???  Marital status: Married   Tobacco Use   ??? Smoking status: Former     Types: Cigarettes     Quit date: 2006     Years since quitting: 17.1   ??? Smokeless tobacco: Never   Vaping Use   ??? Vaping Use: Never used   Substance and Sexual Activity   ??? Alcohol use: Not Currently   ??? Drug use: No   ??? Sexual activity: Yes     Partners: Female       No Known Allergies    Immunization History   Administered Date(s) Administered   ??? Hep A / Hep B 11/12/2017, 12/16/2017   ??? Influenza Vaccine Quad (IIV4 PF) 33mo+ injectable 12/16/2017   ??? Influenza Virus Vaccine, unspecified formulation 11/26/2019        Current Outpatient Medications   Medication Instructions   ??? 2-octyl cyanoacrylate Liqd 10 applicators, Topical (Top), As needed (once a day)   ??? diphenoxylate-atropine (LOMOTIL) 2.5-0.025 mg per tablet 2 tablets, Oral, 4 times a day   ??? erythromycin base (E-MYCIN) 500 MG tablet Take 2 tablets at 2:00 pm, 3:00 pm and 10:00 pm the day before your surgery.   ??? folic acid (FOLVITE) 1 mg, Oral, Daily (standard)   ??? loperamide (IMODIUM) 2 mg capsule TAKE 1 CAPSULE BY MOUTH TWICE DAILY   ??? loratadine (CLARITIN) 10 mg, Oral, Daily (standard)   ??? melatonin 3 mg, Oral, Nightly PRN   ??? methotrexate 25 mg, Oral, Every 7 days   ??? multivitamin (TAB-A-VITE/THERAGRAN) per tablet 1 tablet, Oral, Daily (standard)   ??? neomycin (MYCIFRADIN) 500 mg tablet Take 2 tablets at 2:00 pm, 3:00 pm and 10:00 pm the day before surgery.   ??? omeprazole (PRILOSEC OTC) 20 mg, Oral, Daily (standard)   ??? PAXLOVID CO-PACK, EUA, 300 mg (150 mg x 2)-100 mg tablet See package instructions.   ??? pregabalin (LYRICA) 225 mg, Oral       PHYSICAL EXAM:  BP 119/76  - Pulse 65  - Temp 36.4 ??C (97.6 ??F)  - Ht 172.7 cm (5' 8)  - Wt 75.3 kg (166 lb)  - BMI 25.24 kg/m??   General:   NAD   Skin  No inflammatory or vasculitic lesions. No sq nodules.    Eyes:   PERRL, conjunctiva and sclera not inflamed. Tears appear adequate.    ENT:   No oropharyngeal lesions. Mucous membranes moist.    Lymph:   No masses or cervical lymphadenopathy.    Cardiovascular:  Regular rate and rhythm. No murmur, rub, or gallop. No lower extremity edema.    Gastrointestinal system  Abdomen: soft, NT, ND, without OM. Normal bowel sounds.   RESPIRATORY:  Clear to auscultation. Normal respiratory effort.    Musculoskeletal:   The examination of the small joints of the hands did not reveal tenderness or soft tissue swelling at the PIP's, MCP's or wrists. Swelling R 2nd DIP.  Grip strength was normal.   No tenderness or soft tissue swelling were noted at the elbows, shoulders, knees, ankles or MTP's and toes.  Metatarsal squeeze test was negative bilaterally.  Range of motion of the cervical spine, shoulders and hips full in all planes. Cervical spine extension restricted.  No tender points were present.    Neurological:  CN 2-12 grossly intact. 5/5 strength on extremities. Phalen's negative. Tinel's possibly positive R side.   Psych:  Appropriate affect and mood       Cc:[name], MD  [address]  [address] [employer] [address] [address] [place],  [name] [address]

## 2021-03-22 ENCOUNTER — Ambulatory Visit: Admit: 2021-03-22 | Discharge: 2021-03-22 | Payer: PRIVATE HEALTH INSURANCE

## 2021-03-22 ENCOUNTER — Ambulatory Visit
Admit: 2021-03-22 | Discharge: 2021-03-22 | Payer: PRIVATE HEALTH INSURANCE | Attending: Rheumatology | Primary: Rheumatology

## 2021-03-22 DIAGNOSIS — M542 Cervicalgia: Principal | ICD-10-CM

## 2021-03-22 DIAGNOSIS — M2550 Pain in unspecified joint: Principal | ICD-10-CM

## 2021-03-22 DIAGNOSIS — K51919 Ulcerative colitis, unspecified with unspecified complications: Principal | ICD-10-CM

## 2021-03-22 DIAGNOSIS — R202 Paresthesia of skin: Principal | ICD-10-CM

## 2021-04-16 NOTE — Unmapped (Signed)
Reason For Call:  Patient called with returning joint pain     Assessment:   Spoke to patient today, and he states he stopped Methotrexate per Dr Raphael Gibney (last seen in clinic on 03/20/21) . He now is having severe joint pain again,hips, hands, neck, and shoulder.     Instructions Provided:  As he is now established with rheumatology, and his concern is related to joint pain, recommend he call his rheumagoloy physician who he last saw on 03/22/21.     Also informed pt that RN will update Dr Raphael Gibney as well of his concerns if he would like him to restart methotrexate or follow up with rheum      Follow Up:  - Patient verbalized understanding and agreement with plan       Workup Time:   

## 2021-04-17 MED ORDER — LOPERAMIDE 2 MG CAPSULE
ORAL_CAPSULE | Freq: Two times a day (BID) | ORAL | 1 refills | 90 days | Status: CP
Start: 2021-04-17 — End: ?

## 2021-04-17 MED ORDER — METHOTREXATE SODIUM 2.5 MG TABLET
ORAL_TABLET | ORAL | 0 refills | 0.00000 days | Status: CP
Start: 2021-04-17 — End: ?

## 2021-04-17 MED ORDER — LOPERAMIDE 2 MG TABLET
ORAL_TABLET | ORAL | 1 refills | 15 days | Status: CP
Start: 2021-04-17 — End: 2021-04-17

## 2021-04-17 NOTE — Unmapped (Signed)
Methotrexate Refill  Last Visit Date: 03/22/2021  Next Visit Date: 09/20/2021    Lab Results   Component Value Date    ALT 20 02/22/2021    AST 19 11/24/2020    ALBUMIN 4.1 11/24/2020    CREATININE 1.1 03/16/2021     Lab Results   Component Value Date    WBC 4.9 02/22/2021    HGB 12.8 (L) 02/22/2021    HCT 39.5 02/22/2021    PLT 297 02/22/2021     Lab Results   Component Value Date    NEUTROPCT 51.6 11/24/2020    LYMPHOPCT 30.3 11/24/2020    MONOPCT 11.1 11/24/2020    EOSPCT 5.7 11/24/2020    BASOPCT 1.3 11/24/2020

## 2021-04-17 NOTE — Unmapped (Signed)
Reason for call: PT's Wife called in requesting for a refill on pt's MXT to please be sent to [facility] on file.     Thanks      Last ov: 03/22/2021  Next ov: 09/20/2021

## 2021-04-17 NOTE — Unmapped (Signed)
Addended by: Rolly Salter on: 04/17/2021 02:14 PM     Modules accepted: Orders

## 2021-04-17 NOTE — Unmapped (Signed)
Patient requests refill on loperamide, refill sent per protocol  Spoke to Dr Raphael Gibney regarding patients ongoing joint pain since stopping methotrexate at rheumatologist suggestion.     Since pt has established with rheumatology now, they will need to manage his joint pain.     Spoke to pt and relayed above information. He verbalized understanding

## 2021-04-18 MED ORDER — METHOTREXATE SODIUM 2.5 MG TABLET
ORAL_TABLET | 0 refills | 0 days
Start: 2021-04-18 — End: ?

## 2021-05-28 NOTE — Unmapped (Addendum)
Department of Anesthesiology  [facility]  [address], [address]  [place], [name] [address]  [phone]      Date: May 29, 2021  Patient Name: Kevin Tran  MRN: [id number]  PCP: Doreatha Martin  Referring Provider: [name], MD    Assessment:   Attending: Kiing Grotte is a 61 y.o. 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 He is being seen at the [facility] for history of ileostomy.    Per chart review, the patient was referred to our clinic by General Surgery. Patient reported significant LLQ abdominal pain near surgery site and continued high ostomy output but no other concerns after surgery.     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 His worst pain is related to rectal spasm and polyarthralgias, worst in the wrists (L > R) and bilateral hands. He did have XRAYs of the hands at [facility] in 2022 that were unremarkable.  He was started on lyrica due to initial concern for fibromyalgia and is tolerating that well. He continues to follow with rheumatology for workup of arthritis (negative serologies, unlikely inflammatory arthritis) and is on methotrexate, previously prednisone (for UC prior to November surgery) but no longer on chronic steroids. He has done PT in the past and feels that he is as active (walking) as possible and benefits more from home exercises. He is amenable to a trial of topical ketorolac and starting duloxetine today for improved pain control.     He did ask about a prescription for steroids today.  We discussed that for chronic non-inflammatory pain, we do not recommend oral steroids.  But he has been on them with other providers, so we told him he should get clearance from Dr. Miachel Roux first, and if he was ok with steroids, the patient could ask his other providers for a short course.     Patient does  appear to be utilizing pain medications appropriately and does  report that the medications do improve patient's quality of life and functionality level.  At today's visit, the patient reports poor analgesia from their current medication regimen without significant adverse effects.    Current Pain Provider: no  Urine toxicology: None  Urine toxicology screen N/A appropriate.   Last Opioid Change: N/A  Last EKG:  None  Previous Compliance Issues: None  Naloxone ordered: N/A  Total morphine equivalents: 0  Benzodiazepine: No.  Pain Psychology: N/A  [place] DOC: None  [place] database was reviewed 05/29/21.      1. Chronic pain syndrome    2. H/O ileostomy    3. Bilateral wrist pain    4. Multiple joint pain        General Recommendations: The pain condition that the patient suffers from is best treated with a multidisciplinary approach that involves an increase in physical activity to prevent de-conditioning and worsening of the pain cycle, as well as psychological counseling (formal and/or informal) to address the co-morbid psychological affects of pain.  Treatment will often involve judicious use of pain medications and interventional procedures to decrease the pain, allowing the patient to participate in the physical  activity that will ultimately produce long-lasting pain reductions.  The goal of the multidisciplinary approach is to return the patient to a higher level of overall function and to restore their ability to perform activities of daily living.      Plan:     -Please note that if you have referred this patient for opioid management, that we will not prescribe any controlled substances until our evaluation is complete, satisfactory, and we have assumed opioid prescribing duties with an opioid agreement. As such, any controlled substances must come from the referring provider if indicated.     Polyarthritis of unknown etiology - Bilateral Wrist Pain  Patient was previously told he had rheumatoid arthritis and fibromyalgia, but recent rheumatologic workup was largely negative. Patient asked about the benefit of steroids and was counseled to follow up with his rheumatologist and PCP as it would not provide chronic pain benefit but is appropriate if deemed necessary to prevent the progression of a suspected inflammatory arthritis. He has not used voltaren gel consistently in the past, saying he has to wash his hands frequently to change his ostomy bag, but wants to try consistent use due to its low side effect profile and inability to take PO NSAIDs.   - Continue following with Rheumatology  - Begin voltaren gel 3-4 times daily, applied to wrists and hands    Chronic pain syndrome  Patient denies side effects from current pain regimen. Discussed importance of activity as tolerated and goal of improving his activities of daily living and overall functional status. Patient amenable to starting duloxetine and counseled on side effects and reasons to call and/or return for care.   - Start duloxetine 30mg  daily  - Continue lyrica 225mg  BID  - Continue home PT exercises and activity (enjoys walking)    Future Considerations:  -Increase duloxetine  -Right lumbar MBBs  -Consider muscle relaxant for rectal spasm    -Return in about 2 months (around 07/29/2021) for Md, NP, CPP.    No orders of the defined types were placed in this encounter.    Requested Prescriptions     Signed Prescriptions Disp Refills    DULoxetine (CYMBALTA) 20 MG capsule 30 capsule 2     Sig: Take 1 capsule (20 mg total) by mouth daily.       Subjective:     HPI:  Kevin Tran is seen in consultation at the request of [name], MD  For evaluation and recommendations regarding His chronic pain. Today, the patient presents with his wife. He presents reporting pain was originally in his bilateral shoulders (L>R), low back, and hips. The patient reports he was later diagnosed with ulcerative colitis and had his colon removed and notes winding up with a permanent ostomy bag. The patient reports worsened pain in his back with standing and walking as he states none of his surgeries ever provided relief to his bilateral shoulders and back pain. The patient reports he was referred to a doctor in [place] for further evaluation, though he notes he has deferred at this time due to the complications with his last surgery and no benefit to his chronic pain, as well as he notes he is not interested in any surgical intervention at this time. The patient reports he has no quality of life at this time. 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 The patient reports benefit initially having benefit from PT, though defers referral at this time given  he continues his exercises at home. The patient states he is very active as he owns many kinds of animals and must care for them frequently. Most of his exercise is walking and he is limited in his ability to assist in his and his son's plumbing business due to pain. The patient also reports pain in his wrists and notes they are the worst in the mornings. He reports waning benefit with braces at nighttime. The patient was previously diagnosed with rheumatoid arthritis and fibromyalgia, though he states his blood work suggests otherwise. He reports spasms in his rectum, as well as pain in his bilateral knees. The patient reports he has to drink lots of water, as well as eat many meals to maintain weight. He reports a history of lung surgery and back surgery. The patient states he can breath pretty well. The patient confirms he sometimes feels down in general given his large change in functional status.     In terms of medications, the patient reports benefit with prednisone. He inquires about long term use of prednisone and methotrexate. The patient reports benefit from Lyrica 225 mg BID. He denies trying tizanidine in the past. The patient reports taking Tylenol 6-8 tablets daily with benefit. The patient reports having hydrocodone in the past. The patient reports he was told previously to avoid NSAIDs.     Pain Clinic? No    Current Medications:  Lyrica 225 mg BID    The patient states his pain is located bilateral shoulders, bilateral hands, low back, and bilateral knee and the severity of his pain ranges from 5/10 to 8/10.  On average his pain is 7/10. It is described as aching, burning, sharp, shooting and associated with weakness, tingling, stiffness. He endorses loss of bladder control. His pain is present all of the time and worsened by sitting/laying down. The patient???s pain is alleviated by medication. Prior interventions include PT. Prior imaging include MRI, XR, CT, Nerve Conduction Study. His visit does not involve Workman's Compensation and does not involve a Clinical research associate.    The treatment goals include Complete resolution with medications if necessary, Ability to return to some type of employment, and Ability to return to previous daily routine and activity    Previous Medication Trials: gabapentin, methocarbamol/robaxin, Naproxen/Aleve, and Pregabalin/Lyrica    Past Medical History:   Diagnosis Date    Arthritis     Pneumothorax     Ulcerative colitis (CMS-HCC)      Past Surgical History:   Procedure Laterality Date    BACK SURGERY  1996    COLONOSCOPY      PR COLONOSCOPY W/BIOPSY SINGLE/MULTIPLE Left 07/01/2018    Procedure: COLONOSCOPY, FLEXIBLE, PROXIMAL TO SPLENIC FLEXURE; WITH BIOPSY, SINGLE OR MULTIPLE;  Surgeon: [name], MD;  Location: [facility] PROCED[facility];  Service: Gastroenterology    PR COLONOSCOPY W/BIOPSY SINGLE/MULTIPLE  10/20/2019    Procedure: COLONOSCOPY, FLEXIBLE, PROXIMAL TO SPLENIC FLEXURE; WITH BIOPSY, SINGLE OR MULTIPLE;  Surgeon: Luanne Bras, MD;  Location: [facility]ED[facility];  Service: Gastroenterology    PR LAP, SURG ENTEROLYSIS Midline 06/29/2020    Procedure: ROBOTIC XI LAPAROSCOPY, SURGICAL, ENTEROLYSIS (FREEING OF INTESTINAL ADHESION) (SEPARATE PROCEDURE);  Surgeon: [name], MD;  Location: MAIN OR [facility];  Service: Gastrointestinal    PR LAP, SURG ENTEROLYSIS Midline 12/12/2020    Procedure: ROBOTIC LAPAROSCOPY, SURGICAL, ENTEROLYSIS (FREEING OF INTESTINAL ADHESION) (SEPARATE PROCEDURE);  Surgeon: [name], MD;  Location: MA[facility] OR [facility];  Service: Gastrointestinal  PR LAP, SURG PROCTECTOMY W COLOSTOMY Midline 12/12/2020    Procedure: ROBOTIC XI LAPAROSCOPY, SURGICAL; PROCTECTOMY, COMPLETE, COMBINED ABDOMINOPERINEAL, WITH COLOSTOMY;  Surgeon: [name], MD;  Location: MAIN OR [facility];  Service: Gastrointestinal    PR LAP,SURG,COLECTOMY,TOTAL,W/O PROCTECTOMY N/A 06/29/2020    Procedure: ROBOTIC XI LAPAROSCOPY, SURGICAL; COLECTOMY, TOTAL, ABDOMINAL, WITHOUT PROCTECTOMY, WITH ILEOSTOMY OR ILEOPROCTOSTOMY;  Surgeon: [name], MD;  Location: MAIN OR [facility];  Service: Gastrointestinal    PR RESECT SMALL INTEST,SINGL RESEC/ANAS Midline 12/12/2020    Procedure: ENTERECTOMY SM INTES; SNGL RESECT & ANASTOM;  Surgeon: [name], MD;  Location: MAIN OR [place];  Service: Gastrointestinal    PR SIGMOIDOSCOPY,BIOPSY N/A 03/28/2020    Procedure: SIGMOIDOSCOPY, FLEXIBLE; WITH BIOPSY, SINGLE OR MULTIPLE;  Surgeon: Luanne Bras, MD;  Location: [facility];  Service: Gastroenterology     Family History   Problem Relation Age of Onset    Cancer Mother        Social History:  He reports that he quit smoking about 17 years ago. His smoking use included cigarettes. He has never been exposed to tobacco smoke. He has never used smokeless tobacco. He reports that he does not currently use alcohol. He reports that he does not use drugs.    The patient is married  The patient has children: Yes, 49 and 35  The patient is living with spouse/partner  Highest level of education: High school  Current Employment: Unemployed/part-time, because of pain  Occupation: Nutritional therapist  Exercise: work  Recreational Drugs: No  Treatment for Substance abuse: No  Use anothers prescription medications: No    Allergies as of 05/29/2021    (No Known Allergies)      Current Outpatient Medications   Medication Sig Dispense Refill    acetaminophen (TYLENOL) 500 MG tablet Take 2 tablets (1,000 mg total) by mouth every six (6) hours as needed for pain.      folic acid (FOLVITE) 1 MG tablet Take 1 tablet (1 mg total) by mouth in the morning. 30 tablet 11    loperamide (IMODIUM) 2 mg capsule Take 1 capsule (2 mg total) by mouth Two (2) times a day. 180 capsule 1    methotrexate 2.5 MG tablet Take 10 tablets (25 mg total) by mouth every seven (7) days. 120 tablet 0    multivitamin (TAB-A-VITE/THERAGRAN) per tablet Take 1 tablet by mouth daily.      omeprazole (PRILOSEC OTC) 20 MG tablet Take 1 tablet (20 mg total) by mouth daily.      pregabalin (LYRICA) 225 MG capsule Take 1 capsule (225 mg total) by mouth.      DULoxetine (CYMBALTA) 20 MG capsule Take 1 capsule (20 mg total) by mouth daily. 30 capsule 2    loratadine (CLARITIN) 10 mg tablet Take 10 mg by mouth daily. (Patient not taking: Reported on 05/29/2021)      melatonin 3 mg Tab Take 1 tablet (3 mg total) by mouth nightly as needed. (Patient not taking: Reported on 05/29/2021)  0    PAXLOVID CO-PACK, EUA, 300 mg (150 mg x 2)-100 mg tablet See package instructions. (Patient not taking: Reported on 05/29/2021) 30 tablet 0     No current facility-administered medications for this visit.       Imaging/Tests:   XR SIJ 03/22/21  FINDINGS:   Symmetric sacroiliac joints. Mild periarticular sclerosis, similar to the prior CT pelvis 03/16/2021 and likely degenerative. No erosions. Mild arthrosis at the symphysis pubis. Visualized joints are preserved.  IMPRESSION  No radiographic findings of inflammatory arthropathy.     CT Pelvis 03/16/21  FINDINGS:       BLADDER: Partially distended, otherwise unremarkable.       PELVIC/REPRODUCTIVE ORGANS: Borderline enlarged prostate, measuring up to 4.8 cm.       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 The rectal stump is unremarkable.       PERITONEUM/RETROPERITONEUM AND MESENTERY: No free air. Mild interval decrease in previously described small volume of loculated fluid in the dependent pelvis/presacral space (5:67).       LYMPH NODES: No enlarged lymph nodes.       VESSELS: Scattered calcified and noncalcified atheromatous plaques of the abdominal aorta and its branch vessels, which appear normal in caliber.       BONES AND SOFT TISSUES: Multilevel degenerative disc disease. No suspicious osseous lesions. Postsurgical changes of the anterior abdominal wall. Question tiny soft tissue tract extending to the left perianal soft tissues at approximately the 5:00 position (2:53). No drainable perianal fluid collection.   IMPRESSION  -- Sequelae of prior total colectomy with left lower quadrant end ileostomy. Mild interval decrease in previously described small volume partially loculated fluid in the dependent pelvis/presacral space.   --Tiny soft tissue tract extending through the left perianal soft tissues at approximately the 5:00 position, which may represent an old scarred down perianal fistulous tract. No drainable perirenal fluid collection.     CT Abdomen 01/21/21  FINDINGS:       LOWER CHEST: Redemonstrated right lower lobe subsegmental atelectasis versus scarring. Left-sided gravity dependent atelectasis. Otherwise unremarkable.       LIVER: Normal liver contour.  No focal liver lesions. Small volume focal fat along the falciform and gallbladder fossa, unchanged from 03/27/20.       BILIARY: No biliary ductal dilatation.  The gallbladder is physiologically distended and normal in appearance. SPLEEN: Normal in size and contour.       PANCREAS: Normal pancreatic contour.  No focal lesions.  No ductal dilation.       ADRENAL GLANDS: Normal appearance of the adrenal glands.       KIDNEYS/URETERS: Symmetric renal enhancement.  No hydronephrosis.  No nephrolithiasis.       BLADDER: Physiologically distended and otherwise unremarkable.       REPRODUCTIVE ORGANS: Unremarkable.       GI TRACT: Postsurgical sequela of total colectomy with right lower quadrant ileostomy. Contrast opacifies the stomach and majority of the small bowel, but does not extend to the ileostomy site, likely due to contrast timing. There is also absence of contrast opacification in the right lower quadrant, although there are no abnormally dilated loops of proximal small bowel, and this is likely secondary to contrast timing. Rectal stump decompressed. No findings of acute inflammation.  Surgically absent appendix.       PERITONEUM, RETROPERITONEUM AND MESENTERY: There is mild swirling of the mesentery in the right lower quadrant, likely post-operative. No free air.  No ascites.  Small volume of simple free fluid in the dependent pelvis, likely normal postsurgical sequela.       LYMPH NODES: No adenopathy.       VESSELS: Hepatic and portal veins are patent.  Normal caliber aorta.  Moderate scattered atherosclerotic calcifications of the abdominal aorta and its branching vessels. Accessory left renal vein arising from the inferior pole.       BONES and SOFT TISSUES: Right lower quadrant ileostomy changes and additional anterior abdominal wall  postsurgical changes. No focal rim-enhancing fluid collection. No aggressive osseous lesions.  No focal soft tissue lesions. Multilevel degenerative changes of the visualized spine, most severe at L5-S1 with loss of intervertebral disc space and vacuum disc phenomenon.   IMPRESSION  Sequela of interval total colectomy with right lower quadrant ileostomy. Loculated fluid is identified in the presacral region, however, there is no evidence of discrete wall enhancement to suggest abscess formation at this time.       Additional chronic and incidental findings as above.     XRAY hand results in care everywhere 10/2020    Lab Results   Component Value Date    CREATININE 1.1 03/16/2021       Lab Results   Component Value Date    ALKPHOS 67 11/24/2020    BILITOT 0.7 11/24/2020    BILIDIR 0.10 04/30/2018    PROT 7.5 11/24/2020    ALBUMIN 4.1 11/24/2020    ALT 20 02/22/2021    AST 19 11/24/2020       Lab Results   Component Value Date    PLT 297 02/22/2021     Urine toxiciology screen  No results found for: AMPHU, BARBU, BENZU, CANNAU, METHU, OPIAU, COCAU    OPIOID CONFIRMATION:  Lab Results   Component Value Date    C difficile Toxins A+B, EIA Negative 06/09/2018        BENZODIAZEPINE CONFIRMATION:  Lab Results   Component Value Date    C difficile Toxins A+B, EIA Negative 06/09/2018       Review of Systems:  GENERAL: fatigue, difficulty sleeping  HEENT: ringing in ears, ear aches  SKIN:none  PULMONARY:none  ENDOCRINE: none  GASTROINTESTINAL:none  GENITOURINARY:frequent urination  MUSCULOSKELETAL:joint aches/ and joint swelling  CARDIOVASCULAR:none  NEUROLOGIC:migraines/headaches  PSYCHIATRIC: low concentration and agitation  He denies any homicidal or suicidal ideation.   HEMATOLOGIC: none  IMMUNOLOGIC: none      Objective:     PHYSICAL EXAM:  BP 122/77  - Pulse 72  - Temp 36.3 ??C (97.3 ??F) (Skin)  - Resp 16  - Ht 172.7 cm (5' 8)  - Wt 76.7 kg (169 lb 1.6 oz)  - SpO2 97%  - BMI 25.71 kg/m??   Wt Readings from Last 3 Encounters:   05/29/21 76.7 kg (169 lb 1.6 oz)   03/22/21 75.3 kg (166 lb)   03/05/21 75.8 kg (167 lb 3.2 oz)     GENERAL: Well developed, well-nourished male and is in no apparent distress. The patient is pleasant and interactive. Patient is a good historian.  No evidence of sedation or intoxication.  No overt pain behaviors  HEENT: Normocephalic/atraumatic. Clear sclera.   CARDIOVASCULAR:  RRR, no murmur  RESPIRATORY:  Normal work of breathing, no supplemental 02. No crackles, wheezes, or rhonchi bilaterally.  EXTREMITIES: No clubbing, cyanosis noted. Wrists, elbows, and ankles without edema, warmth, or deformity. Active range of motion decreased in left wrist and shoulder (decreased extension of wrist and shoulder) but full passive range of  motion.   GASTROINTESTINAL: Soft, nondistended  NEUROLOGIC:  Alert and oriented, speech fluent, normal language. Cranial nerves grossly intact.  Sensation Intact to light touch throughout the bilateral upper and lower extremities..   MUSCULOSKELETAL:  Motor function  5/5 in upper and lower extremities. Patient rises from a seated position with no difficulty.  The patient was able to ambulate with no difficulty throughout the clinic today without the assistance of a walking aid-None.     SPINE:The patient does  have  pain with palpation of the Lumbar spine. Positive pain on facet loading procedures of the right side. Surgical scaring is noted on lumbar spine in the midline with well healed scar and no associated erythema, dehiscence, or signs of infection.   SKIN: No obvious rashes lesions or erythema on the exposed skin  PSYCHIATRIC:Appropriate, full range affect, no psychomotor retardation    I have personally reviewed the patient's medical record.   I have personally reviewed the patient's clinical labs and/or urine toxicology studies  I have independently reviewed patient's imaging studies.   I have not requested patient's old medical records from the prior provider.   The patient's significant other and/or family member/friend was present during this visit and has contributed important medical information as pertinent to patient's medical history.   I have reviewed the [place] Narcotic Database today    Documentation assistance was provided by Martie , on May 29, 2021 at 12:57 PM for Dr. Dola Factor Jennaya Pogue, MD.    ----------------------------------------------------------------------------------------------------------------------  May 29, 2021 3:43 PM. Documentation assistance provided by the Scribe. I was present during the time the encounter was recorded. The information recorded by the Scribe was done at my direction and has been reviewed and validated by me.  ----------------------------------------------------------------------------------------------------------------------

## 2021-05-29 ENCOUNTER — Ambulatory Visit
Admit: 2021-05-29 | Discharge: 2021-05-29 | Payer: PRIVATE HEALTH INSURANCE | Attending: Anesthesiology | Primary: Anesthesiology

## 2021-05-29 DIAGNOSIS — M25531 Pain in right wrist: Principal | ICD-10-CM

## 2021-05-29 DIAGNOSIS — Z9889 Other specified postprocedural states: Principal | ICD-10-CM

## 2021-05-29 DIAGNOSIS — M2550 Pain in unspecified joint: Principal | ICD-10-CM

## 2021-05-29 DIAGNOSIS — G894 Chronic pain syndrome: Principal | ICD-10-CM

## 2021-05-29 DIAGNOSIS — M25532 Pain in left wrist: Principal | ICD-10-CM

## 2021-05-29 MED ORDER — DULOXETINE 20 MG CAPSULE,DELAYED RELEASE
ORAL_CAPSULE | Freq: Every day | ORAL | 2 refills | 30 days | Status: CP
Start: 2021-05-29 — End: ?

## 2021-05-29 NOTE — Unmapped (Signed)
Today we did the following :  -It was good to see you    -If we have prescribed you a medication, be prepared that your insurance company may require additional paperwork (even for generic medication), typically called a prior authorization.  This sometimes can delay the prescription, but know that our office is very efficient at taking care of these.  If it is significantly delayed, you may call the pharmacy for updates, or call the office.    -Continue Lyrica    -Another type of pain medications are the anti-depressants.  We talked about Cymbalta (duloxetine).  This is a once a day medication that is good for pain.  It usually is best to take in the morning.  If you find it sedating, you can take it at night.  It sometimes takes 4-6 weeks to really start working.  We may also need to increase the dose in the future.  Most common side effects are nausea, dry mouth, headache, sedation, fatigue.      -We talked about Voltaren gel, which is an anti-inflammatory, but in topical form.  It is much safer to use on a daily basis.  You can use it on painful joints up to 4 times per day.  You can use 2 grams (around 1.5 inches) on areas above your belly button (neck, shoulder, elbow, hands, etc) and 4 grams (around 3 inches) on areas below your belly button (hips, knees, ankles, etc).  Max-32 grams in a day.     It has now gone Over The Counter (OTC)!! This means you can find it at almost all pharmacies.  It is the same strength at the prescription strength (which is not usually covered by insurance).    -Talk to your other providers if they think a short course of prednisone would be appropriate.  We typically do not prescribe for chronic pain.    -Follow up in 2 months    -Please plan to arrive to all appointments at least 30 minutes prior to your scheduled appointment time. Failure to do so may delay your visit.    MyChart Messages:  Please use MyChart for non-urgent symptoms or matters such as general questions, non-urgent prescription refills, or non-urgent scheduling issues. For your safety and best  care please DO NOT use MyChart messages to report emergent or urgent symptoms as messages are only checked during regular business hours.  These messages are checked by the nurses during normal business hours 8:30 am-4:30 pm Monday-Friday every 24-48 hours and are for non-urgent, non-emergent concerns. You may be asked to return for a follow up visit if it is deemed your questions are best handled in the clinic setting.  Please call our nurse line below for more time sensitive issues. If our office is closed you can contact the hospital operator to reach the on call provider available for urgent/emergent issues only.     -Because of the high volume of calls we receive and the high demand for our clinical services, we are occupied all day providing care for patients in the clinic. This leaves little time to respond to phone calls, and we are generally unable to discuss patient care advice over the telephone. If you are experiencing a medication side effect or complication, you can call and let us know, but we will typically not make a medication substitution or change over the telephone.     We are generally unable to respond acutely to a flare up of pain, as this is  quite common in our patients and needs to be dealt with as part of the long term management plan. Please make an appointment with [name] if you wish to discuss a matter in any detail. Should you still need to call, please do so at [phone].       Thank you for choosing [facility]. It was a pleasure to see you in clinic today. Please contact us with any questions or concerns at [phone].     Criss Rosales, MD    05/29/2021  Anesthesiologist and Pain Management Provider  [facility]          What are common side effects from pain medications you may be taking?    Do not drive or do anything with responsibility until you know you are tolerating a new medication.    Many pain type medications can be sedating, especially in combination with others.  They can also decrease your breathing especially in combination with other medications.  If a medication is not effective for you, talk to [name] about weaning off (do not stop abruptly on your own), so we do not just continue it.    Only take medications as prescribed.  If a medication is prescribed as needed, it means you can use it as you need it and do not have to take it every day.  Do not ever take more than what is prescribed to you for any medication.  Not only could this be dangerous, but if you do not take it as prescribed we may have to discontinue it due to safety concerns.  If you feel like your medications are not controlling your medications, do not adjust the medication on your own.  Please discuss at your next clinic visit and we can determine a plan.    Opioids/Narcotics (Examples: oxycodone, hydrocodone, tramadol, morphine, hydromorphone, fentanyl patch)  ?? Constipation  ?? Itching  ?? Nausea  ?? Dizziness or lightheadedness  ?? Drowsiness or sedation  ?? Decreased breathing   ?? Addiction     Anticonvulsants (Examples: Gabapentin, Pregabalin, Carbamazepine, topiramate)  New warning in 2019 that these medications in combination with opioids can cause respiratory depression.  ?? Dizziness or lightheadedness  ?? Drowsiness or sedation  ?? Nausea  ?? Leg swelling     Antidepressants (Examples: duloxetine, amitriptyline, nortriptyline, venlafaxine)  ?? Dizziness or lightheadedness  ?? Drowsiness or sedation  ?? Constipation  ?? Urinary retention  ?? Dry mouth  ?? Nausea  ?? Difficulty falling asleep     NSAIDS (Examples: Ibuprofen, naproxen, meloxicam, celecoxib)  -The FDA put out guidelines in 2015 that NSAIDs should not be used regularly due to safety concerns.  OK to use a few times a month, or for 1-2 weeks for a flare of pain.  If you take NSAIDs everyday, we should discuss this and stop it due to safety.  ?? Bleeding  ?? Stomach ulcers  ?? Kidney problems   -Heart Attack  -Stroke     Muscle relaxants (Examples: cyclobenzaprine, tizanidine, baclofen)  ?? Dizziness or lightheadedness  ?? Drowsiness or sedation  ?? Nausea  ?? Low blood pressure  ?? Dry mouth     Many pain medications can lead to a rare, potentially dangerous condition called serotonin syndrome. Symptoms can include tremors, agitation, fever, muscle rigidity/contractions, diarrhea, excessive sweating, palpitations and high blood pressure. If these symptoms arise, please contact us or report to the emergency room for further management.      Your pharmacist can answer  any questions you may have in regard to all your medications or possible interactions.

## 2021-05-31 NOTE — Unmapped (Signed)
Addended by: Carolynne Edouard on: 05/31/2021 09:17 AM     Modules accepted: Level of Service

## 2021-07-31 ENCOUNTER — Ambulatory Visit
Admit: 2021-07-31 | Discharge: 2021-08-01 | Payer: PRIVATE HEALTH INSURANCE | Attending: Anesthesiology | Primary: Anesthesiology

## 2021-07-31 DIAGNOSIS — M25531 Pain in right wrist: Principal | ICD-10-CM

## 2021-07-31 DIAGNOSIS — M25532 Pain in left wrist: Principal | ICD-10-CM

## 2021-07-31 DIAGNOSIS — G894 Chronic pain syndrome: Principal | ICD-10-CM

## 2021-07-31 DIAGNOSIS — M2550 Pain in unspecified joint: Principal | ICD-10-CM

## 2021-07-31 MED ORDER — PREGABALIN 200 MG CAPSULE
ORAL_CAPSULE | Freq: Three times a day (TID) | ORAL | 2 refills | 30 days | Status: CP
Start: 2021-07-31 — End: ?

## 2021-07-31 NOTE — Unmapped (Addendum)
Today we did the following :  -It was good to see you    -If we have prescribed you a medication, be prepared that your insurance company may require additional paperwork (even for generic medication), typically called a prior authorization.  This sometimes can delay the prescription, but know that our office is very efficient at taking care of these.  If it is significantly delayed, you may call the pharmacy for updates, or call the office.    -INCREASE Lyrica to 200mg  three times a day    -Discontinue Cymbalta officially    -We are sending a compound cream to a compounding pharmacy called [facility]. They should contact you to set up delivery but you can call them as well to expedite this process. [phone]    -See rheumatology in August    -We obtained urine toxicology today, in case we may consider something called the Butrans patch for chronic pain.    -Follow up in 2-3 months    -Please plan to arrive to all appointments at least 30 minutes prior to your scheduled appointment time. Failure to do so may delay your visit.    MyChart Messages:  Please use MyChart for non-urgent symptoms or matters such as general questions, non-urgent prescription refills, or non-urgent scheduling issues. For your safety and best  care please DO NOT use MyChart messages to report emergent or urgent symptoms as messages are only checked during regular business hours.  These messages are checked by the nurses during normal business hours 8:30 am-4:30 pm Monday-Friday every 24-48 hours and are for non-urgent, non-emergent concerns. You may be asked to return for a follow up visit if it is deemed your questions are best handled in the clinic setting.  Please call our nurse line below for more time sensitive issues. If our office is closed you can contact the hospital operator to reach the on call provider available for urgent/emergent issues only.     -Because of the high volume of calls we receive and the high demand for our clinical services, we are occupied all day providing care for patients in the clinic. This leaves little time to respond to phone calls, and we are generally unable to discuss patient care advice over the telephone. If you are experiencing a medication side effect or complication, you can call and let us know, but we will typically not make a medication substitution or change over the telephone.     We are generally unable to respond acutely to a flare up of pain, as this is quite common in our patients and needs to be dealt with as part of the long term management plan. Please make an appointment with [name] if you wish to discuss a matter in any detail. Should you still need to call, please do so at [phone].       Thank you for choosing [facility]. It was a pleasure to see you in clinic today. Please contact us with any questions or concerns at [phone].     Criss Rosales, MD    07/31/2021  Anesthesiologist and Pain Management Provider  [facility]          What are common side effects from pain medications you may be taking?    Do not drive or do anything with responsibility until you know you are tolerating a new medication.    Many pain type medications can be sedating, especially in combination with others.  They can also decrease  your breathing especially in combination with other medications.  If a medication is not effective for you, talk to [name] about weaning off (do not stop abruptly on your own), so we do not just continue it.    Only take medications as prescribed.  If a medication is prescribed as needed, it means you can use it as you need it and do not have to take it every day.  Do not ever take more than what is prescribed to you for any medication.  Not only could this be dangerous, but if you do not take it as prescribed we may have to discontinue it due to safety concerns.  If you feel like your medications are not controlling your medications, do not adjust the medication on your own.  Please discuss at your next clinic visit and we can determine a plan.    Opioids/Narcotics (Examples: oxycodone, hydrocodone, tramadol, morphine, hydromorphone, fentanyl patch)  ?? Constipation  ?? Itching  ?? Nausea  ?? Dizziness or lightheadedness  ?? Drowsiness or sedation  ?? Decreased breathing   ?? Addiction     Anticonvulsants (Examples: Gabapentin, Pregabalin, Carbamazepine, topiramate)  New warning in 2019 that these medications in combination with opioids can cause respiratory depression.  ?? Dizziness or lightheadedness  ?? Drowsiness or sedation  ?? Nausea  ?? Leg swelling     Antidepressants (Examples: duloxetine, amitriptyline, nortriptyline, venlafaxine)  ?? Dizziness or lightheadedness  ?? Drowsiness or sedation  ?? Constipation  ?? Urinary retention  ?? Dry mouth  ?? Nausea  ?? Difficulty falling asleep     NSAIDS (Examples: Ibuprofen, naproxen, meloxicam, celecoxib)  -The FDA put out guidelines in 2015 that NSAIDs should not be used regularly due to safety concerns.  OK to use a few times a month, or for 1-2 weeks for a flare of pain.  If you take NSAIDs everyday, we should discuss this and stop it due to safety.  ?? Bleeding  ?? Stomach ulcers  ?? Kidney problems   -Heart Attack  -Stroke     Muscle relaxants (Examples: cyclobenzaprine, tizanidine, baclofen)  ?? Dizziness or lightheadedness  ?? Drowsiness or sedation  ?? Nausea  ?? Low blood pressure  ?? Dry mouth     Many pain medications can lead to a rare, potentially dangerous condition called serotonin syndrome. Symptoms can include tremors, agitation, fever, muscle rigidity/contractions, diarrhea, excessive sweating, palpitations and high blood pressure. If these symptoms arise, please contact us or report to the emergency room for further management.      Your pharmacist can answer any questions you may have in regard to all your medications or possible interactions.

## 2021-07-31 NOTE — Unmapped (Signed)
Patient states he stopped the Duloxetine about one week ago because his mouth was so dry, the dentist said I would lose all my teeth.  One fell out.  Patient states he tried the gum and mouth rinse for dry mouth but it did not help.

## 2021-07-31 NOTE — Unmapped (Addendum)
Department of Anesthesiology  [facility]  [address], [address]  [place], [name] [address]  [phone]    Chronic Pain Follow Up Note  1. Multiple joint pain    2. Chronic pain syndrome    3. Bilateral wrist pain      Assessment and Plan  Attending: Naheim Cranson is a 61 y.o. 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 [facility] for diffuse arthralgias and abdominal pain related to ostomy output.  He was first seen in April 2023.    The patient was referred to our clinic by General Surgery for reported significant LLQ abdominal pain near surgery site and continued high ostomy output but no other concerns after surgery.      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 His worst pain is related to rectal spasm and polyarthralgias, worst in the wrists (L > R) and bilateral hands. He did have XRAYs of the hands at [facility] in 2022 that were unremarkable.  He was started on lyrica due to initial concern for fibromyalgia and more neuropathic pain and is tolerating that well. He continues to follow with rheumatology for workup of arthritis (negative serologies, unlikely inflammatory arthritis) and is on methotrexate, previously prednisone (for UC prior to November surgery) but no longer on chronic steroids. He has done PT in the past and feels that he is as active (walking) as possible and benefits more from home exercises.     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 His worst pain was related to rectal spasm and polyarthralgias, worst in the wrists (L > R) and bilateral hands. He did have XRAYs of the hands at [facility] in 2022 that were unremarkable.  He was started on lyrica due to initial concern for fibromyalgia and is tolerating that well. He continued to follow with rheumatology for workup of arthritis (negative serologies, unlikely inflammatory arthritis) and was on methotrexate, previously prednisone (for UC prior to November surgery) but no longer on chronic steroids. He had done PT in the past and felt that he was as active (walking) as possible and benefited more from home exercises. He was amenable to a trial of topical ketorolac and starting duloxetine for improved pain control. He did ask about a prescription for steroids.  We discussed that for chronic non-inflammatory pain, we did not recommend oral steroids.  But he had been on them with other providers, so we told him he should get clearance from Dr. Miachel Roux first, and if he was ok with steroids, the patient could ask his other providers for a short course.    Today, the patient presents with overall worsened pain since last visit. He endorses pain in his back, neck, shoulders, and hands. Patient has been following with Rheumatology, though workup has been thus far unremarkable. He discontinued Cymbalta due to intolerable dry mouth. Patient is hopeful for some relief as this pain has  made working difficult, as well as holding objects and sleeping. Patient endorses pain is worse in the mornings and per outside provider has been wearing braces on his hands at nighttime. We encouraged exercise and movement. Patient denies benefit from Voltaren gel and we discussed trying a compounded cream as patient is not responding to systemic medications for his symptoms. He is hopeful for some relief and amenable to try the compounded cream. We collected a urine drug screen today as he may be a candidate for Butrans patch in the future as he has failed several conservative options and given the severity of his pain and impact on his function. We also discussed increasing his Lyrica to TID dosing for which he was agreeable. Counseled on reverting to previous dosage if over sedation becomes and issue.      Patient does  appear to be utilizing pain medications appropriately and does  report that the medications do improve patient's quality of life and functionality level.  At today's visit, the patient reports poor analgesia from their current medication regimen without significant adverse effects.    Urine toxicology: None, collect today as we may consider butrans  Urine toxicology screen N/A appropriate.   Last Opioid Change: N/A  Last EKG:  None  Previous Compliance Issues: None  Naloxone ordered: N/A  Total morphine equivalents: 0  Benzodiazepine: No.  Pain Psychology: Consider referral in the future  [place] DOC: None  [place] database was reviewed 07/31/21.        Polyarthritis of unknown etiology - Bilateral Wrist Pain   - Continue following with Rheumatology  - Start compound cream to be used during flares, prescription sent to [facility] and patient provided with phone number to contact regarding delivery    Chronic pain syndrome  - Discontinue duloxetine 30mg  daily due to side effects  - Increase Lyrica to 200mg  TID, new prescription sent  - Continue home PT exercises and activity (enjoys walking)  -Obtain UDS today, should be negative.  He denies opioid use, illegal drug use.  He does note sometimes drinking 1-2 beers, which we can discuss if we consider Butrans.    Future Considerations:  - Right lumbar MBBs  - Consider muscle relaxant for rectal spasm    -Return for 2-3 months.    Orders Placed This Encounter   Procedures    Drug Screen, [facility], Urine     Order Specific Question:   Patient's Current Medications     Answer:   None     Order Specific Question:   Release to patient     Answer:   Immediate     Requested Prescriptions     Signed Prescriptions Disp Refills    pregabalin (LYRICA) 200 MG capsule 90 capsule 2     Sig: Take 1 capsule (200 mg total) by mouth Three (3) times a day.     HPI:  Mr. Borenstein is seen in consultation at the request of [name], MD  For evaluation and recommendations regarding His chronic pain.     Patient was initially seen in April. No notable history since last visit.     Today, the patient reports worsened pain in his back, neck, shoulders, and hands since last visit. He notes getting trigger finger at times that causes pain. He reports wearing wrist braces at nighttime suggested by another provider. The patient reports difficulty sleeping due to pain. The patient reports muscle spasms in his hands. He reports difficulty working due to  pain. The patient notes difficulty holding objects and states pain is worse in the mornings. He states he is up for anything in hopes of some relief.      In terms of medications, the patient reports discontinuing Cymbalta 30 mg due to intolerable dry mouth. He denies any withdrawal symptoms when stopping the medication without wean. He reports using Voltaren gel with no benefit. The patient confirms taking Lyrica 225 mg BID. The patient reports benefit from oral steroids. He reports taking Tylenol. The patient denies trying a compounded cream in the past.     Current Medications:  Lyrica 225 mg BID  Stopped Cymbalta ~10 days ago    From intake paperwork:  The patient states his pain is located bilateral shoulders, hands, hips and the severity of his pain ranges from 5/10 to 7/10.  His pain currently is 7/10 and on average is 7/10. He describes the sensation of his pain as aching, burning. His pain is present all of the time and worst mornings. The patient???s pain impacts enjoyment of life, general activity, mood, normal work. His interval history includes PCP visit. His pain has stayed the same, and he does not have new pain to discuss today. He is not on blood thinners or anti-coagulants. In regards to medications currently taken for pain management, the patient is tolerating these medications well and complains of associated side effects: dry mouth, change in sleep patterns, dizziness.     Current view: Showing all answers           Op Gct Opt Outs       Question 07/30/2021 10:00 PM EDT - Filed by Patient    Would you like to be excluded from the patient list? Yes    Would you like for [name] to withhold information about you from your family and friends? No    Would you like for [name] to withhold information about you from community clergy? Yes          The treatment goals include Complete resolution with medications if necessary, Ability to return to some type of employment, and Ability to return to previous daily routine and activity    Previous Medication Trials: Diclofenac gel/voltaren gel, duloxetine/cymbalta (side effects), gabapentin, methocarbamol/robaxin, Naproxen/Aleve, and Pregabalin/Lyrica, compounded topical     Prior interventions include medications, PT.     Prior imaging include MRI, XR, CT, Nerve Conduction Study.     The treatment goals include Complete resolution with medications if necessary, Ability to return to some type of employment, and Ability to return to previous daily routine and activity     Allergies as of 07/31/2021    (No Known Allergies)      Current Outpatient Medications   Medication Sig Dispense Refill    acetaminophen (TYLENOL) 500 MG tablet Take 2 tablets (1,000 mg total) by mouth every six (6) hours as needed for pain.      folic acid (FOLVITE) 1 MG tablet Take 1 tablet (1 mg total) by mouth in the morning. 30 tablet 11    loperamide (IMODIUM) 2 mg capsule Take 1 capsule (2 mg total) by mouth Two (2) times a day. 180 capsule 1    melatonin 3 mg Tab Take 1 tablet (3 mg total) by mouth nightly as needed.  0    methotrexate 2.5 MG tablet Take 10 tablets (25 mg total) by mouth every seven (7) days. 120 tablet 0    multivitamin (TAB-A-VITE/THERAGRAN) per tablet Take 1 tablet by mouth daily.  omeprazole (PRILOSEC OTC) 20 MG tablet Take 1 tablet (20 mg total) by mouth daily.      predniSONE (DELTASONE) 10 MG tablet Take 1 tablet (10 mg total) by mouth daily.      pregabalin (LYRICA) 200 MG capsule Take 1 capsule (200 mg total) by mouth Three (3) times a day. 90 capsule 2     No current facility-administered medications for this visit.     Imaging/Tests:   XR SIJ 03/22/21  FINDINGS:   Symmetric sacroiliac joints. Mild periarticular sclerosis, similar to the prior CT pelvis 03/16/2021 and likely degenerative. No erosions. Mild arthrosis at the symphysis pubis. Visualized joints are preserved.   IMPRESSION  No radiographic findings of inflammatory arthropathy.     CT Pelvis 03/16/21  FINDINGS:       BLADDER: Partially distended, otherwise unremarkable.       PELVIC/REPRODUCTIVE ORGANS: Borderline enlarged prostate, measuring up to 4.8 cm.       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 The rectal stump is unremarkable.       PERITONEUM/RETROPERITONEUM AND MESENTERY: No free air. Mild interval decrease in previously described small volume of loculated fluid in the dependent pelvis/presacral space (5:67).       LYMPH NODES: No enlarged lymph nodes.       VESSELS: Scattered calcified and noncalcified atheromatous plaques of the abdominal aorta and its branch vessels, which appear normal in caliber.       BONES AND SOFT TISSUES: Multilevel degenerative disc disease. No suspicious osseous lesions. Postsurgical changes of the anterior abdominal wall. Question tiny soft tissue tract extending to the left perianal soft tissues at approximately the 5:00 position (2:53). No drainable perianal fluid collection.   IMPRESSION  -- Sequelae of prior total colectomy with left lower quadrant end ileostomy. Mild interval decrease in previously described small volume partially loculated fluid in the dependent pelvis/presacral space.   --Tiny soft tissue tract extending through the left perianal soft tissues at approximately the 5:00 position, which may represent an old scarred down perianal fistulous tract. No drainable perirenal fluid collection.     CT Abdomen 01/21/21  FINDINGS:       LOWER CHEST: Redemonstrated right lower lobe subsegmental atelectasis versus scarring. Left-sided gravity dependent atelectasis. Otherwise unremarkable.       LIVER: Normal liver contour.  No focal liver lesions. Small volume focal fat along the falciform and gallbladder fossa, unchanged from 03/27/20.       BILIARY: No biliary ductal dilatation.  The gallbladder is physiologically distended and normal in appearance.       SPLEEN: Normal in size and contour.       PANCREAS: Normal pancreatic contour.  No focal lesions.  No ductal dilation.       ADRENAL GLANDS: Normal appearance of the adrenal glands.       KIDNEYS/URETERS: Symmetric renal enhancement.  No hydronephrosis.  No nephrolithiasis.       BLADDER: Physiologically distended and otherwise unremarkable.       REPRODUCTIVE ORGANS: Unremarkable.       GI TRACT: Postsurgical sequela of total colectomy with right lower quadrant ileostomy. Contrast opacifies the stomach and majority of the small bowel, but does not extend to the ileostomy site, likely due to contrast timing. There is also absence of contrast opacification in the right lower quadrant, although there are no abnormally dilated loops of proximal small bowel, and this is likely secondary to contrast timing. Rectal stump decompressed. No findings  of acute inflammation.  Surgically absent appendix.       PERITONEUM, RETROPERITONEUM AND MESENTERY: There is mild swirling of the mesentery in the right lower quadrant, likely post-operative. No free air.  No ascites.  Small volume of simple free fluid in the dependent pelvis, likely normal postsurgical sequela.       LYMPH NODES: No adenopathy. VESSELS: Hepatic and portal veins are patent.  Normal caliber aorta.  Moderate scattered atherosclerotic calcifications of the abdominal aorta and its branching vessels. Accessory left renal vein arising from the inferior pole.       BONES and SOFT TISSUES: Right lower quadrant ileostomy changes and additional anterior abdominal wall postsurgical changes. No focal rim-enhancing fluid collection. No aggressive osseous lesions.  No focal soft tissue lesions. Multilevel degenerative changes of the visualized spine, most severe at L5-S1 with loss of intervertebral disc space and vacuum disc phenomenon.   IMPRESSION  Sequela of interval total colectomy with right lower quadrant ileostomy. Loculated fluid is identified in the presacral region, however, there is no evidence of discrete wall enhancement to suggest abscess formation at this time.       Additional chronic and incidental findings as above.     XRAY hand results in care everywhere 10/2020    Lab Results   Component Value Date    CREATININE 1.1 03/16/2021     Lab Results   Component Value Date    ALKPHOS 67 11/24/2020    BILITOT 0.7 11/24/2020    BILIDIR 0.10 04/30/2018    PROT 7.5 11/24/2020    ALBUMIN 4.1 11/24/2020    ALT 20 02/22/2021    AST 19 11/24/2020     Lab Results   Component Value Date    PLT 297 02/22/2021     Urine toxiciology screen  No results found for: AMPHU, BARBU, BENZU, CANNAU, METHU, OPIAU, COCAU    OPIOID CONFIRMATION:  Lab Results   Component Value Date    C difficile Toxins A+B, EIA Negative 06/09/2018      BENZODIAZEPINE CONFIRMATION:  Lab Results   Component Value Date    C difficile Toxins A+B, EIA Negative 06/09/2018     Review of Systems:  General difficulty sleeping  Cardiovascular none  Gastrointestinal none  Skin none  Endocrine none  Musculoskeletal spasms/spacticity/cramps, back pain, muscle aches/weakness  Neurologic migraines/headaches  Psychiatric depression, anxiety    He denies any homicidal or suicidal ideation. OBJECTIVE  PHYSICAL EXAM:  BP 134/84  - Pulse 69  - Temp 36.3 ??C (97.3 ??F) (Skin)  - Resp 16  - Ht 172.7 cm (5' 8)  - Wt 76.7 kg (169 lb)  - SpO2 97%  - BMI 25.70 kg/m??   Wt Readings from Last 3 Encounters:   07/31/21 76.7 kg (169 lb)   05/29/21 76.7 kg (169 lb 1.6 oz)   03/22/21 75.3 kg (166 lb)     GENERAL: The patient is a well developed, well-nourished male and appears to be in no apparent distress. The patient is pleasant and interactive. Patient is a good historian.  No evidence of sedation or intoxication.  HEAD/NECK: Reveals normocephalic/atraumatic. clear sclera.  CARDIOVASCULAR: Warm, well perfused  RESPIRATORY: Normal work of breathing  EXTREMITIES: No clubbing, cyanosis noted.  GASTROINTESTINAL:Soft, nondistended; colostomy  NEUROLOGIC:  The patient was alert and oriented, speech fluent, normal language. CN 2-12 grossly intact.   MUSCULOSKELETAL: Moves extremities spontaneously  GAIT:The patient rises from a seated position with no difficulty.  The patient was able to ambulate with  no difficulty throughout the clinic today without the assistance of a walking aid-None.   SKIN:No obvious rashes, lesions, or erythema.  ZOX:[id number] Affect    Documentation assistance was provided by Waldemar Dickens Scribe, on July 31, 2021 at 10:22 AM for Dr. Lynnda Shields, MD.    I saw and examined the patient. I reviewed the Scribe's note and agree with the findings and plan of care as transcribed.    Karma Greaser, MD  PGY-5   Department of Anesthesiology   Pain Medicine Division      Medical Decision Making    Problems Addressed:  1 or more chronic illness w/ exacerbation, progression, or side effects of treatment (moderate)    Amount/Complexity:  Reviewed external notes: None  Reviewed results: None  Ordered tests: None  Assessment requiring independent historian:  Wife present and provides HPI  Discussion of management/test results with medical professional: None  Independent interpretation of test: None    Risk  Moderate: Prescription Drug Management, Minor surgery w/ risk factors, Elective Major Surgery w/o risk factors, Diagnosis/treatment significantly limited by social determinants of health

## 2021-07-31 NOTE — Unmapped (Signed)
Prescription for Compounding Cream & patient demographic information faxed to [facility]. Original sent to be scanned to patient's chart.

## 2021-08-04 LAB — DRUG SCREEN, PAIN CLINIC, URINE
2-HYDROXY ETHYL FLURAZEPAM, UR: NOT DETECTED ng/mL
6-MONOACETYLMORPHINE, UR: NOT DETECTED ng/mL
7-AMINOCLONAZEPAM, UR: NOT DETECTED ng/mL
7-AMINOFLUNITRAZEPAM, UR: NOT DETECTED ng/mL
ALPHA-HYDROXY MIDAZOLAM, UR: NOT DETECTED ng/mL
ALPHA-HYDROXY TRIAZOLAM, UR: NOT DETECTED ng/mL
ALPHA-HYDROXYALPRAZOLAM GLUCURONIDE, UR: NOT DETECTED ng/mL
ALPRAZOLAM, UR: NOT DETECTED ng/mL
AMPHETAMINE, UR: NOT DETECTED ng/mL
BUPRENORPHINE, UR: NOT DETECTED ng/mL
CHLORDIAZEPOXIDE, UR: NOT DETECTED ng/mL
CLOBAZAM, UR: NOT DETECTED ng/mL
CLONAZEPAM, UR: NOT DETECTED ng/mL
COCAINE METABOLITES URINE: NEGATIVE ng/mL
CODEINE, UR: NOT DETECTED ng/mL
CODEINE-6-BETA-GLUCURONIDE, UR: NOT DETECTED ng/mL
COMMENT,URINE: NORMAL
CREATININE-O-ADULT: 45 mg/dL
DIAZEPAM, UR: NOT DETECTED ng/mL
DIHYDROCODEINE, UR: NOT DETECTED ng/mL
EDDP, UR: NOT DETECTED ng/mL
EPHEDRINE, UR: NOT DETECTED ng/mL
FENTANYL, UR: NOT DETECTED ng/mL
FLUNITRAZEPAM, UR: NOT DETECTED ng/mL
FLURAZEPAM, UR: NOT DETECTED ng/mL
HYDROCODONE, UR: NOT DETECTED ng/mL
HYDROMORPHONE, UR: NOT DETECTED ng/mL
HYDROMORPHONE-3-BETA-GLUCURONIDE, UR: NOT DETECTED ng/mL
LORAZEPAM GLUCURONIDE, UR: NOT DETECTED ng/mL
LORAZEPAM, UR: NOT DETECTED ng/mL
MDA, UR: NOT DETECTED ng/mL
MDEA, UR: NOT DETECTED ng/mL
MDMA, UR: NOT DETECTED ng/mL
MEPERIDINE, UR: NOT DETECTED ng/mL
METHADONE, UR: NOT DETECTED ng/mL
METHAMPHETAMINE, UR: NOT DETECTED ng/mL
METHYLPHENIDATE, UR: NOT DETECTED ng/mL
MIDAZOLAM, UR: NOT DETECTED ng/mL
MORPHINE, UR: NOT DETECTED ng/mL
MORPHINE-6-BETA-GLUCURONIDE, UR: NOT DETECTED ng/mL
N-DESMETHYLCLOBAZAM, UR: NOT DETECTED ng/mL
NALOXONE, UR: NOT DETECTED ng/mL
NORBUPRENOORPHINE GLUCURONIDE, UR: NOT DETECTED ng/mL
NORBUPRENORPHINE, UR: NOT DETECTED ng/mL
NORDIAZEPAM, UR: NOT DETECTED ng/mL
NORFENTANYL, UR: NOT DETECTED ng/mL
NORHYDROCODONE, UR: NOT DETECTED ng/mL
NORMEPERIDINE, UR: NOT DETECTED ng/mL
NOROXYCODONE, UR: NOT DETECTED ng/mL
NOROXYMORPHONE, UR: NOT DETECTED ng/mL
OXAZEPAM GLUCURONIDE, UR: NOT DETECTED ng/mL
OXAZEPAM, UR: NOT DETECTED ng/mL
OXIDANTS-O-ADULT: NEGATIVE
OXYCODONE, UR: NOT DETECTED ng/mL
OXYMORPHONE, UR: NOT DETECTED ng/mL
PH-O-ADULT: 5.5
PHENCYCLIDINE, UR: NOT DETECTED ng/mL
PHENTERMINE, UR: NOT DETECTED ng/mL
PRAZEPAM, UR: NOT DETECTED ng/mL
PROPOXYPHENE, UR: NOT DETECTED ng/mL
PSEUDOEPHEDRINE, UR: NOT DETECTED ng/mL
RITALINIC ACID, UR: NOT DETECTED ng/mL
SPECIFIC GRAVITY-O-ADULT: 1.01
TAPENTADOL, UR: NOT DETECTED ng/mL
TEMAZEPAM GLUCURONIDE, UR: NOT DETECTED ng/mL
TEMAZEPAM, UR: NOT DETECTED ng/mL
THC, SCREEN URINE: NEGATIVE ng/mL
TRAMADOL, UR: NOT DETECTED ng/mL
TRIAZOLAM, UR: NOT DETECTED ng/mL
URINE BARBITURATES MAYO: NEGATIVE ng/mL
ZOLPIDEM PHENYL-4-CARBOXYLIC ACID, UR: NOT DETECTED ng/mL
ZOLPIDEM, UR: NOT DETECTED ng/mL

## 2021-08-21 NOTE — Unmapped (Addendum)
Fax from pharmacy requesting duloxetine 20 mg cap refill: 07/31/2021 visit medication discontinued due to side effect. Pt can discuss medication request at next visit.

## 2021-09-03 DIAGNOSIS — K51919 Ulcerative colitis, unspecified with unspecified complications: Principal | ICD-10-CM

## 2021-09-03 MED ORDER — METHOTREXATE SODIUM 2.5 MG TABLET
ORAL_TABLET | ORAL | 0 refills | 84 days
Start: 2021-09-03 — End: ?

## 2021-09-03 MED ORDER — FOLIC ACID 1 MG TABLET
ORAL_TABLET | Freq: Every day | ORAL | 5 refills | 30 days | Status: CP
Start: 2021-09-03 — End: 2022-09-03

## 2021-09-03 MED ORDER — LOPERAMIDE 2 MG CAPSULE
ORAL_CAPSULE | Freq: Two times a day (BID) | ORAL | 1 refills | 90 days | Status: CP
Start: 2021-09-03 — End: ?

## 2021-09-03 NOTE — Unmapped (Addendum)
Received call from pt's wife requesting refills for pt and need for follow up with Dr. Raphael Gibney. Called back and clarified refills are needed for folic acid, loperamide and methotrexate. Pt due for labs. Standing orders inputted for [facility], per pt's request. GIS messaged to add pt to Dr. Jonny Ruiz waitlist. Refill for loperamide and folic acid sent to pharmacy for 6 month supply. Methotrexate refill routed to Dr. Raphael Gibney.

## 2021-09-11 LAB — CBC W/ DIFFERENTIAL
HEMATOCRIT: 41.7 %
HEMOGLOBIN: 13.9 g/dL
MEAN CORPUSCULAR VOLUME: 87 fL
PLATELET COUNT: 265 10*9/L
WHITE BLOOD CELL COUNT: 7 10*9/L

## 2021-09-11 LAB — AST: AST (SGOT): 21 U/L

## 2021-09-11 LAB — ALT: ALT (SGPT): 36 U/L

## 2021-09-11 NOTE — Unmapped (Signed)
Labs DOS 09/10/21, full report available in media tab date 09/11/21. Will enter in Epic results.

## 2021-09-12 MED ORDER — METHOTREXATE SODIUM 2.5 MG TABLET
ORAL_TABLET | ORAL | 0 refills | 84 days | Status: CP
Start: 2021-09-12 — End: 2021-12-11

## 2021-09-12 NOTE — Unmapped (Signed)
Methotrexate refill sent

## 2021-09-28 NOTE — Unmapped (Signed)
IMPRESSION: Kevin Tran is a 61 y.o. male with known ulcerative colitis, unresponsive to multiple immunosuppressive agents, that eventually required colectomy.   This was preceded by musculoskeletal pain involving both shoulders and eventually wrists and hands with onset 2 years ago (prior to surgery). The patient has never experienced swelling or warmth of joints and describes the quality of the pain is a burning sensation of the shoulders,wrists and hands. Moreover, when he took anti-TNF's, [name] and more recently methotrexate 25 mg weekly, he did not experience great relief of symptoms. The only medicine that has relieved the symptoms is prednisone.   At his initial visit with me he had no clinical evidence of inflammatory arthritis and I suspected neuropathic type pain and recommended XR of the cervical spine which was essentially normal and recommended nerve studies however, the patient had normal nerve studies done at the [facility] rheumatology clinic prior to that. Additionally, he now follows at the pain clinic and has not experienced relief with Lyrica and did not tolerate Duloxetine. He again presented today complaining of a burning sensation extending from the wrists down his hands and on exam aside from some bony enlargements he has no synovitis.     PLAN:  ??? We had a long discussion with patient and his wife today who accompanied him. I explained that I suspect that his hand pain is non-inflammatory however would like to completely prove that he does not have an inflammatory arthropathy. So we all agreed with the following:   o He will discontinue methotrexate and will obtain a diagnotic MSK ultrasound of the wrists and fingers in about a month to evaluate for synovitis   o Will also get an XR of the hands today   o I have messaged his GI to see if we can give him Celebrex since he will be off of methotrexate   o If ultrasound shows synovitis we will restart immunosuppression     Return in about 8 weeks (around 11/28/2021) with Carlus Pavlov PA-c and in 5-6 months with me.    Scribe's Attestation: Nelda Bucks MD obtained and performed the history, physical exam and medical decision making elements that were entered into the chart.  Signed by Charlotte Crumb, Scribe, on October 03, 2021 1:31 PM.    The documentation recorded by the scribe accurately reflects the service I personally performed and the decisions made by me. Lillia Pauls, MD  ___________________________________________________________________________    Chief Complaint: bilateral hand burning pain    HPI:  Kevin Tran is a 61 y.o. male with severe ulcerative colitis    Since last visit on 03/22/21, patient has had no hospitalizations or ED visits.    Today, the patient reports no changes. He still has bilateral hand burning pain. He has trouble gripping objects and continued pain in his neck. He now reports having trigger finger in his middle finger. States that when he tries to make a wrist his middle finger locks up. He notes that this started about 2 years ago in his shoulder and then spread to his wrists and hands. Denies numbness or swelling. Sleeps with wrist braces that help his pain. He stopped taking methotrexate and his symptoms started to reappear so he spoke to Dr. Raphael Gibney to re-prescribe it.     He endorses AM stiffness. He still goes to the pain clinic. They increased his Lyrica. He tried Cymbalta and Humira but it didn't help. He notes that the only medication that has improved his  symptoms is prednisone.     His sister has fibromyalgia.     LABS/IMAGING:  EXAM: XR SACROILIAC JOINTS 3 OR MORE VIEWS  DATE: 03/22/2021 11:59 AM  FINDINGS:  Symmetric sacroiliac joints. Mild periarticular sclerosis, similar to the prior CT pelvis 03/16/2021 and likely degenerative. No erosions. Mild arthrosis at the symphysis pubis. Visualized joints are preserved    EXAM: XR CERVICAL SPINE AP AND LATERAL  DATE: 03/22/2021 11:59 AM  FINDINGS:Vertebral body heights are preserved. Normal cervical lordosis without listhesis.Intervertebral disc spaces are preserved. No facet degenerative changes.No prevertebral soft tissue swelling. Left-sided carotid artery calcifications. Visualized lung apices are negative.    EXAM: Magnetic resonance imaging, spinal canal and contents, cervical without contrast material.  DATE: 07/23/2016  IMPRESSION:  Small disc protrusion at C5-6. No significant spinal canal or foraminal narrowing.    Documentation on 09/11/2021   Component Date Value   ??? ALT 09/10/2021 36    ??? AST 09/10/2021 21    ??? WBC 09/10/2021 7.0    ??? HGB 09/10/2021 13.9    ??? HCT 09/10/2021 41.7    ??? MCV 09/10/2021 87.0    ??? Platelet 09/10/2021 265      ROS: All systems were reviewed and negative except as noted in the HPI.    Past Medical History:   Diagnosis Date   ??? Arthritis    ??? Pneumothorax    ??? Ulcerative colitis (CMS-HCC)        Past Surgical History:   Procedure Laterality Date   ??? BACK SURGERY  1996   ??? COLONOSCOPY     ??? PR COLONOSCOPY W/BIOPSY SINGLE/MULTIPLE Left 07/01/2018    Procedure: COLONOSCOPY, FLEXIBLE, PROXIMAL TO SPLENIC FLEXURE; WITH BIOPSY, SINGLE OR MULTIPLE;  Surgeon: [name], MD;  Location: [facility] PROCED[facility];  Service: Gastroenterology   ??? PR COLONOSCOPY W/BIOPSY SINGLE/MULTIPLE  10/20/2019    Procedure: COLONOSCOPY, FLEXIBLE, PROXIMAL TO SPLENIC FLEXURE; WITH BIOPSY, SINGLE OR MULTIPLE;  Surgeon: Luanne Bras, MD;  Location: [facility] PROCED[facility];  Service: Gastroenterology   ??? PR LAP, SURG ENTEROLYSIS Midline 06/29/2020    Procedure: ROBOTIC XI LAPAROSCOPY, SURGICAL, ENTEROLYSIS (FREEING OF INTESTINAL ADHESION) (SEPARATE PROCEDURE);  Surgeon: [name], MD;  Location: MAIN OR [facility];  Service: Gastrointestinal   ??? PR LAP, SURG ENTEROLYSIS Midline 12/12/2020    Procedure: ROBOTIC LAPAROSCOPY, SURGICAL, ENTEROLYSIS (FREEING OF INTESTINAL ADHESION) (SEPARATE PROCEDURE);  Surgeon: [name], MD;  Location: MAIN OR [facility];  Service: Gastrointestinal   ??? PR LAP, SURG PROCTECTOMY W COLOSTOMY Midline 12/12/2020    Procedure: ROBOTIC XI LAPAROSCOPY, SURGICAL; PROCTECTOMY, COMPLETE, COMBINED ABDOMINOPERINEAL, WITH COLOSTOMY;  Surgeon: [name], MD;  Location: MAIN OR [facility];  Service: Gastrointestinal   ??? PR LAP,SURG,COLECTOMY,TOTAL,W/O PROCTECTOMY N/A 06/29/2020    Procedure: ROBOTIC XI LAPAROSCOPY, SURGICAL; COLECTOMY, TOTAL, ABDOMINAL, WITHOUT PROCTECTOMY, WITH ILEOSTOMY OR ILEOPROCTOSTOMY;  Surgeon: [name], MD;  Location: MAIN OR [facility];  Service: Gastrointestinal   ??? PR RESECT SMALL INTEST,SINGL RESEC/ANAS Midline 12/12/2020    Procedure: ENTERECTOMY SM INTES; SNGL RESECT & ANASTOM;  Surgeon: [name], MD;  Location: MAIN OR [place];  Service: Gastrointestinal   ??? PR SIGMOIDOSCOPY,BIOPSY N/A 03/28/2020    Procedure: SIGMOIDOSCOPY, FLEXIBLE; WITH BIOPSY, SINGLE OR MULTIPLE;  Surgeon: Luanne Bras, MD;  [facility];  Service: Gastroenterology       Family History   Problem Relation Age of Onset   ??? Cancer Mother        Social  History     Socioeconomic History   ??? Marital status: Married   Tobacco Use   ??? Smoking status: Former     Types: Cigarettes     Quit date: 2006     Years since quitting: 17.6     Passive exposure: Never   ??? Smokeless tobacco: Never   Vaping Use   ??? Vaping Use: Never used   Substance and Sexual Activity   ??? Alcohol use: Not Currently   ??? Drug use: No   ??? Sexual activity: Yes     Partners: Female       No Known Allergies    Immunization History   Administered Date(s) Administered   ??? Hep A / Hep B 11/12/2017, 12/16/2017   ??? Influenza Vaccine Quad (IIV4 PF) 5mo+ injectable 12/16/2017   ??? Influenza Virus Vaccine, unspecified formulation 11/26/2019        Current Outpatient Medications   Medication Instructions   ??? acetaminophen (TYLENOL) 1,000 mg, Oral, Every 6 hours PRN   ??? loperamide (IMODIUM) 2 mg, Oral, 2 times a day (standard)   ??? melatonin 3 mg, Oral, Nightly PRN   ??? multivitamin (TAB-A-VITE/THERAGRAN) per tablet 1 tablet, Oral, Daily (standard)   ??? omeprazole (PRILOSEC OTC) 20 mg, Oral, Daily (standard)   ??? pregabalin (LYRICA) 200 mg, Oral, 3 times a day (standard)       PHYSICAL EXAM:  BP 123/74 (BP Site: L Arm, BP Position: Sitting, BP Cuff Size: Medium)  - Pulse 61  - Temp 36.3 ??C (97.3 ??F) (Temporal)  - Wt 78.1 kg (172 lb 3.2 oz)  - BMI 26.18 kg/m??   General:   NAD   Skin  No inflammatory or vasculitic lesions. No sq nodules.    Eyes:   PERRL, conjunctiva and sclera not inflamed. Tears appear adequate.    ENT:   No oropharyngeal lesions. Mucous membranes moist.    Lymph:   No masses or cervical lymphadenopathy.    Cardiovascular:  Regular rate and rhythm. No murmur, rub, or gallop. No lower extremity edema.    Gastrointestinal system  Abdomen: soft, NT, ND, without OM. Normal bowel sounds.   RESPIRATORY:  Clear to auscultation. Normal respiratory effort.    Musculoskeletal:   The examination of the small joints of the hands did not reveal tenderness or soft tissue swelling at the DIP's, PIP's, MCP's or wrists.   Grip strength was normal.   No tenderness or soft tissue swelling were noted at the elbows, shoulders, knees, ankles or MTP's and toes.  Metatarsal squeeze test was negative bilaterally.  Range of motion of the cervical spine, shoulders and hips full in all planes.  No tender points were present.    Neurological:  CN 2-12 grossly intact. 5/5 strength on extremities.   Psych:  Appropriate affect and mood

## 2021-10-03 ENCOUNTER — Ambulatory Visit
Admit: 2021-10-03 | Discharge: 2021-10-04 | Payer: PRIVATE HEALTH INSURANCE | Attending: Rheumatology | Primary: Rheumatology

## 2021-10-03 ENCOUNTER — Ambulatory Visit: Admit: 2021-10-03 | Discharge: 2021-10-04 | Payer: PRIVATE HEALTH INSURANCE

## 2021-10-03 DIAGNOSIS — K51918 Ulcerative colitis, unspecified with other complication: Principal | ICD-10-CM

## 2021-10-28 NOTE — Unmapped (Unsigned)
Department of Anesthesiology  [name]  [address]  [name], [name] [address]  [phone]    Chronic Pain Follow Up Note  No diagnosis found.    Assessment and Plan  Attending: [name] is a 61 y.o. 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 [facility] for diffuse arthralgias and abdominal pain related to ostomy output.  He was first seen in April 2023.    The patient was referred to our clinic by General Surgery for reported significant LLQ abdominal pain near surgery site and continued high ostomy output but no other concerns after surgery.      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 His worst pain is related to rectal spasm and polyarthralgias, worst in the wrists (L > R) and bilateral hands. He did have XRAYs of the hands at [facility] in 2022 that were unremarkable.  He was started on lyrica due to initial concern for fibromyalgia and more neuropathic pain and is tolerating that well. He continues to follow with rheumatology for workup of arthritis (negative serologies, unlikely inflammatory arthritis) and is on methotrexate, previously prednisone (for UC prior to November surgery) but no longer on chronic steroids. He has done PT in the past and feels that he is as active (walking) as possible and benefits more from home exercises.     September 2023  At initial visit in June, the patient reported overall worsened pain since lasted visited. He endorses pain in his backed, neck, shoulders, and handed. Patient had followed withRheumatology, though workup had been thus far unremarkable. He discontinued Cymbalta due to intolerable dried mouth. Patient was hopeful for some relief as this pain has made worked difficult, as well as held objected and slept. Patient endorses pain was worse in the mornings and per outside provider has been wore braced on his handed at nighttime. We encouraged exercised and movement. Patient denies benefit from Voltaren gel and we discussed tried a compounded cream as patient is not responded to systemic medications for his symptoms. He is hopeful for some relief and amenable to tried the compounded cream. We collected a urine drug screen today as he might Have Been a candidate for Butrans patch in the future as he has failed several conservative options and given the severity of his pain and impact on his function. We also discussed increased his Lyrica to TID dosing for which he was agreeable. Counseled on reverting to previous dosage if over sedation became and issue.     Today, ***     Urine toxicology: None, collect today as we may consider butrans  Urine toxicology screen N/A appropriate.   Last Opioid Change: N/A  Last EKG:  None  Previous Compliance Issues: None  Naloxone ordered: N/A  Total morphine equivalents: 0  Benzodiazepine: No.  Pain Psychology: Consider referral in the future  [place] DOC: None  [place] database was reviewed 10/28/21.        Polyarthritis of unknown etiology - Bilateral Wrist Pain   - Continue following with Rheumatology  - Start compound cream to be used during flares, prescription  sent to [facility] and patient provided with phone number to contact regarding delivery    Chronic pain syndrome  - Discontinue duloxetine 30mg  daily due to side effects  - Increase Lyrica to 200mg  TID, new prescription sent  - Continue home PT exercises and activity (enjoys walking)  -Obtain UDS today, should be negative.  He denies opioid use, illegal drug use.  He does note sometimes drinking 1-2 beers, which we can discuss if we consider Butrans.    Future Considerations:  - Right lumbar MBBs  - Consider muscle relaxant for rectal spasm    -No follow-ups on file.    No orders of the defined types were placed in this encounter.    Requested Prescriptions      No prescriptions requested or ordered in this encounter     HPI:  Kevin Tran is seen in consultation at the request of [name], MD  For evaluation and recommendations regarding His chronic pain.     Patient was last seen in June, patient reported worsened pain in his backed, neck, shoulders, and hand since his last visit. He has discontinued Cymbalta due to dry mouth and is hopeful for relief. He has been advised to exercise and move, but denies benefit from Voltaren gel. A urine drug screen was collected to determine if he might be a candidate for a Butrans patch. Lyrica was increased to TID dosing.    Today, the patient reports ***     In terms of medications, ***    Current Medications Regime:  Lyrica 225 mg BID  Stopped Cymbalta ~10 days ago    From intake paperwork:    The patient states his pain is located *** and the severity of his pain ranges from ***/10 to ***/10.  His pain currently is ***/10 and on average is ***/10. He describes the sensation of his pain as {pain described:58928}. His pain is present {pain frequency:58931} and worst {pain worst:59685}. The patient???s pain impacts {negatively affected:59709}. His interval history includes {interval history:59710}. His pain {pain changed:59711}, and he {does does not blank:47747} have new pain to discuss today. He {is/is not:36772} on blood thinners or anti-coagulants. In regards to medications currently taken for pain management, the patient {is:54246::is} tolerating these medications well and complains of associated side effects: {pain med side effects:59712}.     Current view: Showing all answers           Op Gct Opt Outs       Question 07/30/2021 10:00 PM EDT - Filed by Patient    Would you like to be excluded from the patient list? Yes    Would you like for [name] to withhold information about you from your family and friends? No    Would you like for [name] to withhold information about you from community clergy? Yes          The treatment goals include Complete resolution with medications if necessary, Ability to return to some type of employment, and Ability to return to previous daily routine and activity    Previous Medication Trials: Diclofenac gel/voltaren gel, duloxetine/cymbalta (side effects), gabapentin, methocarbamol/robaxin, Naproxen/Aleve, and Pregabalin/Lyrica, compounded topical     Prior interventions include medications, PT.     Prior imaging include MRI, XR, CT, Nerve Conduction Study.     The treatment goals include Complete resolution with medications if necessary, Ability to return to some type of employment, and Ability to return to previous daily routine and activity  Allergies as of 10/29/2021    (No Known Allergies)      Current Outpatient Medications   Medication Sig Dispense Refill    acetaminophen (TYLENOL) 500 MG tablet Take 2 tablets (1,000 mg total) by mouth every six (6) hours as needed for pain.      loperamide (IMODIUM) 2 mg capsule Take 1 capsule (2 mg total) by mouth Two (2) times a day. 180 capsule 1    melatonin 3 mg Tab Take 1 tablet (3 mg total) by mouth nightly as needed.  0    multivitamin (TAB-A-VITE/THERAGRAN) per tablet Take 1 tablet by mouth daily.      omeprazole (PRILOSEC OTC) 20 MG tablet Take 1 tablet (20 mg total) by mouth daily.      pregabalin (LYRICA) 200 MG capsule Take 1 capsule (200 mg total) by mouth Three (3) times a day. 90 capsule 2     No current facility-administered medications for this visit.     Imaging/Tests:   XR SIJ 03/22/21  FINDINGS:   Symmetric sacroiliac joints. Mild periarticular sclerosis, similar to the prior CT pelvis 03/16/2021 and likely degenerative. No erosions. Mild arthrosis at the symphysis pubis. Visualized joints are preserved.   IMPRESSION  No radiographic findings of inflammatory arthropathy.     CT Pelvis 03/16/21  FINDINGS:       BLADDER: Partially distended, otherwise unremarkable.       PELVIC/REPRODUCTIVE ORGANS: Borderline enlarged prostate, measuring up to 4.8 cm.       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 The rectal stump is unremarkable.       PERITONEUM/RETROPERITONEUM AND MESENTERY: No free air. Mild interval decrease in previously described small volume of loculated fluid in the dependent pelvis/presacral space (5:67).       LYMPH NODES: No enlarged lymph nodes.       VESSELS: Scattered calcified and noncalcified atheromatous plaques of the abdominal aorta and its branch vessels, which appear normal in caliber.       BONES AND SOFT TISSUES: Multilevel degenerative disc disease. No suspicious osseous lesions. Postsurgical changes of the anterior abdominal wall. Question tiny soft tissue tract extending to the left perianal soft tissues at approximately the 5:00 position (2:53). No drainable perianal fluid collection.   IMPRESSION  -- Sequelae of prior total colectomy with left lower quadrant end ileostomy. Mild interval decrease in previously described small volume partially loculated fluid in the dependent pelvis/presacral space.   --Tiny soft tissue tract extending through the left perianal soft tissues at approximately the 5:00 position, which may represent an old scarred down perianal fistulous tract. No drainable perirenal fluid collection.     CT Abdomen 01/21/21  FINDINGS:       LOWER CHEST: Redemonstrated right lower lobe subsegmental atelectasis versus scarring. Left-sided gravity dependent atelectasis. Otherwise unremarkable.       LIVER: Normal liver contour.  No focal liver lesions. Small volume focal fat along the falciform and gallbladder fossa, unchanged from 03/27/20.       BILIARY: No biliary ductal dilatation.  The gallbladder is physiologically distended and normal in appearance. SPLEEN: Normal in size and contour.       PANCREAS: Normal pancreatic contour.  No focal lesions.  No ductal dilation.       ADRENAL GLANDS: Normal appearance of the adrenal glands.       KIDNEYS/URETERS: Symmetric renal enhancement.  No hydronephrosis.  No nephrolithiasis.       BLADDER: Physiologically distended and otherwise  unremarkable.       REPRODUCTIVE ORGANS: Unremarkable.       GI TRACT: Postsurgical sequela of total colectomy with right lower quadrant ileostomy. Contrast opacifies the stomach and majority of the small bowel, but does not extend to the ileostomy site, likely due to contrast timing. There is also absence of contrast opacification in the right lower quadrant, although there are no abnormally dilated loops of proximal small bowel, and this is likely secondary to contrast timing. Rectal stump decompressed. No findings of acute inflammation.  Surgically absent appendix.       PERITONEUM, RETROPERITONEUM AND MESENTERY: There is mild swirling of the mesentery in the right lower quadrant, likely post-operative. No free air.  No ascites.  Small volume of simple free fluid in the dependent pelvis, likely normal postsurgical sequela.       LYMPH NODES: No adenopathy.       VESSELS: Hepatic and portal veins are patent.  Normal caliber aorta.  Moderate scattered atherosclerotic calcifications of the abdominal aorta and its branching vessels. Accessory left renal vein arising from the inferior pole.       BONES and SOFT TISSUES: Right lower quadrant ileostomy changes and additional anterior abdominal wall postsurgical changes. No focal rim-enhancing fluid collection. No aggressive osseous lesions.  No focal soft tissue lesions. Multilevel degenerative changes of the visualized spine, most severe at L5-S1 with loss of intervertebral disc space and vacuum disc phenomenon.   IMPRESSION  Sequela of interval total colectomy with right lower quadrant ileostomy. Loculated fluid is identified in the presacral region, however, there is no evidence of discrete wall enhancement to suggest abscess formation at this time.       Additional chronic and incidental findings as above.     XRAY hand results in care everywhere 10/2020    Lab Results   Component Value Date    CREATININE 1.1 03/16/2021     Lab Results   Component Value Date    ALKPHOS 67 11/24/2020    BILITOT 0.7 11/24/2020    BILIDIR 0.10 04/30/2018    PROT 7.5 11/24/2020    ALBUMIN 4.1 11/24/2020    ALT 36 09/10/2021    AST 21 09/10/2021     Lab Results   Component Value Date    PLT 265 09/10/2021     Urine toxiciology screen  No results found for: AMPHU, BARBU, BENZU, CANNAU, METHU, OPIAU, COCAU    OPIOID CONFIRMATION:  Lab Results   Component Value Date    C difficile Toxins A+B, EIA Negative 06/09/2018      BENZODIAZEPINE CONFIRMATION:  Lab Results   Component Value Date    C difficile Toxins A+B, EIA Negative 06/09/2018     Review of Systems:  General {ajlrosgen:46920}  Cardiovascular {aggROSCV:37303}  Gastrointestinal {aggROSGI:37304}  Skin {aggROSskin:37305}  Endocrine {aggROSendo:37306}  Musculoskeletal {aggROSmusk:37307}  Neurologic {aggROSneu:37308}  Psychiatric {aggROSpsych:37309}    He denies any homicidal or suicidal ideation.     OBJECTIVE  PHYSICAL EXAM:  There were no vitals taken for this visit.  Wt Readings from Last 3 Encounters:   10/03/21 78.1 kg (172 lb 3.2 oz)   07/31/21 76.7 kg (169 lb)   05/29/21 76.7 kg (169 lb 1.6 oz)     GENERAL:  The patient is a well developed, well-nourished {loboncobese:42394} and appears to be in {BLANK UJWJXB:14782} distress. The patient is pleasant and interactive. Patient is a good historian.  No evidence of sedation or intoxication.  HEAD/NECK:    Reveals normocephalic/atraumatic. clear sclera.  Range of motion: ***  CARDIOVASCULAR:   Warm, well perfused  RESPIRATORY:   Normal work of breathing  EXTREMITIES:  No clubbing, cyanosis noted.  GASTROINTESTINAL:  Soft, nondistended  NEUROLOGIC:    The patient was alert and oriented, speech fluent, normal language. CN 2-12 grossly intact.  Sensation {WF Exam Neuro Sensation:33718}  MUSCULOSKELETAL:    Motor function  {Pain Motor [phone]::5/5} in {ajlupperlower:48905::upper,lower} extremities.   SPINE:  Paraspinal tenderness to palpation {present/not present:25705} in the {Cervical Thoracic Lumbar:47568} spine.   GAIT:  The patient rises from a seated position with {ajlambulate3:46823::no} difficulty.  The patient was able to ambulate with {ajlambulate3:46823::no} difficulty throughout the clinic today {With/without:5700} the assistance of a walking aid-{ajlcane:46931::None}.   SKIN:   No obvious rashes, lesions, or erythema.  PSY:  Appropriate Affect

## 2021-10-29 ENCOUNTER — Ambulatory Visit
Admit: 2021-10-29 | Discharge: 2021-10-30 | Payer: PRIVATE HEALTH INSURANCE | Attending: Anesthesiology | Primary: Anesthesiology

## 2021-10-29 DIAGNOSIS — M25531 Pain in right wrist: Principal | ICD-10-CM

## 2021-10-29 DIAGNOSIS — M25532 Pain in left wrist: Principal | ICD-10-CM

## 2021-10-29 DIAGNOSIS — M2550 Pain in unspecified joint: Principal | ICD-10-CM

## 2021-10-29 DIAGNOSIS — G609 Hereditary and idiopathic neuropathy, unspecified: Principal | ICD-10-CM

## 2021-10-29 DIAGNOSIS — G894 Chronic pain syndrome: Principal | ICD-10-CM

## 2021-10-29 MED ORDER — PREGABALIN 225 MG CAPSULE
ORAL_CAPSULE | Freq: Two times a day (BID) | ORAL | 2 refills | 30 days | Status: CP
Start: 2021-10-29 — End: ?

## 2021-10-29 MED ORDER — BUPRENORPHINE 5 MCG/HOUR WEEKLY TRANSDERMAL PATCH
MEDICATED_PATCH | TRANSDERMAL | 2 refills | 28 days | Status: CP
Start: 2021-10-29 — End: ?

## 2021-10-29 NOTE — Unmapped (Signed)
Today we did the following :  -It was good to see you    -If we have prescribed you a medication, be prepared that your insurance company may require additional paperwork (even for generic medication), typically called a prior authorization.  This sometimes can delay the prescription, but know that our office is very efficient at taking care of these.  If it is significantly delayed, you may call the pharmacy for updates, or call the office.    -DECREASELyrica back to 225mg  twice a day.  If headache, vision changes, and grogginess do not improve, talk to your PCP    -Start BuTrans patch.  The patch is replaced weekly and does not need to go on an area of pain.  Rotate where you are placing it.  If it causes mild itching or redness, you can use OTC flonase.  Just spray flonase on area where patch to go, and once dry, place patch.  If itching or skin changes are more serious, likely need to remove the patch and let us know.  If issues with peeling, can use OTC clear bandages (aka tegaderm).  Starting and dose changes can take up to 1-2 weeks to see full effect.      You can call in 3-4 weeks, if you are tolerating, we can increase butrans to before you follow up with [name].    -Continue with Rheumatology    -Referral to neurology for possible small fiber peripheral neuropathy    -Follow up in 2-3 months    -Please plan to arrive to all appointments at least 30 minutes prior to your scheduled appointment time. Failure to do so may delay your visit.    MyChart Messages:  Please use MyChart for non-urgent symptoms or matters such as general questions, non-urgent prescription refills, or non-urgent scheduling issues. For your safety and best  care please DO NOT use MyChart messages to report emergent or urgent symptoms as messages are only checked during regular business hours.  These messages are checked by the nurses during normal business hours 8:30 am-4:30 pm Monday-Friday every 24-48 hours and are for non-urgent, non-emergent concerns. You may be asked to return for a follow up visit if it is deemed your questions are best handled in the clinic setting.  Please call our nurse line below for more time sensitive issues. If our office is closed you can contact the hospital operator to reach the on call provider available for urgent/emergent issues only.     -Because of the high volume of calls we receive and the high demand for our clinical services, we are occupied all day providing care for patients in the clinic. This leaves little time to respond to phone calls, and we are generally unable to discuss patient care advice over the telephone. If you are experiencing a medication side effect or complication, you can call and let us know, but we will typically not make a medication substitution or change over the telephone.     We are generally unable to respond acutely to a flare up of pain, as this is quite common in our patients and needs to be dealt with as part of the long term management plan. Please make an appointment with [name] if you wish to discuss a matter in any detail. Should you still need to call, please do so at [phone].       Thank you for choosing [facility]. It was a pleasure to see you in clinic  today. Please contact us with any questions or concerns at [phone].     Criss Rosales, MD    10/29/2021  Anesthesiologist and Pain Management Provider  [facility]          What are common side effects from pain medications you may be taking?    Do not drive or do anything with responsibility until you know you are tolerating a new medication.    Many pain type medications can be sedating, especially in combination with others.  They can also decrease your breathing especially in combination with other medications.  If a medication is not effective for you, talk to [name] about weaning off (do not stop abruptly on your own), so we do not just continue it.    Only take medications as prescribed.  If a medication is prescribed as needed, it means you can use it as you need it and do not have to take it every day.  Do not ever take more than what is prescribed to you for any medication.  Not only could this be dangerous, but if you do not take it as prescribed we may have to discontinue it due to safety concerns.  If you feel like your medications are not controlling your medications, do not adjust the medication on your own.  Please discuss at your next clinic visit and we can determine a plan.    Opioids/Narcotics (Examples: oxycodone, hydrocodone, tramadol, morphine, hydromorphone, fentanyl patch)  ?? Constipation  ?? Itching  ?? Nausea  ?? Dizziness or lightheadedness  ?? Drowsiness or sedation  ?? Decreased breathing   ?? Addiction     Anticonvulsants (Examples: Gabapentin, Pregabalin, Carbamazepine, topiramate)  New warning in 2019 that these medications in combination with opioids can cause respiratory depression.  ?? Dizziness or lightheadedness  ?? Drowsiness or sedation  ?? Nausea  ?? Leg swelling     Antidepressants (Examples: duloxetine, amitriptyline, nortriptyline, venlafaxine)  ?? Dizziness or lightheadedness  ?? Drowsiness or sedation  ?? Constipation  ?? Urinary retention  ?? Dry mouth  ?? Nausea  ?? Difficulty falling asleep     NSAIDS (Examples: Ibuprofen, naproxen, meloxicam, celecoxib)  -The FDA put out guidelines in 2015 that NSAIDs should not be used regularly due to safety concerns.  OK to use a few times a month, or for 1-2 weeks for a flare of pain.  If you take NSAIDs everyday, we should discuss this and stop it due to safety.  ?? Bleeding  ?? Stomach ulcers  ?? Kidney problems   -Heart Attack  -Stroke     Muscle relaxants (Examples: cyclobenzaprine, tizanidine, baclofen)  ?? Dizziness or lightheadedness  ?? Drowsiness or sedation  ?? Nausea  ?? Low blood pressure  ?? Dry mouth     Many pain medications can lead to a rare, potentially dangerous condition called serotonin syndrome. Symptoms can include tremors, agitation, fever, muscle rigidity/contractions, diarrhea, excessive sweating, palpitations and high blood pressure. If these symptoms arise, please contact us or report to the emergency room for further management.      Your pharmacist can answer any questions you may have in regard to all your medications or possible interactions.

## 2021-10-29 NOTE — Unmapped (Signed)
AVS and Rxes reviewed.  Below controlled  Rxes sent electronically to pt's pharmacy.  Pt voiced understanding.    buprenorphine 5 mcg/hour PTWK transdermal patch 4 patch 2 10/29/2021     Sig - Route: Place 1 patch on the skin every seven (7) days. - Transdermal    Sent to pharmacy as: buprenorphine 5 mcg/hour weekly transdermal patch    Notes to Pharmacy: For chronic pain    E-Prescribing Status: Receipt confirmed by pharmacy (10/29/2021  1:13 PM EDT)    Prior authorization: Waiting for Payer Response

## 2021-10-29 NOTE — Unmapped (Addendum)
Department of Anesthesiology  [facility]  [address], [address]  [place], [name] [address]  [phone]    Chronic Pain Follow Up Note  1. Idiopathic peripheral neuropathy    2. Chronic pain syndrome    3. Multiple joint pain    4. Bilateral wrist pain      Assessment and Plan  Attending: [name] is a 61 y.o. 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 [facility] diffuse arthralgias and abdominal pain related to ostomy output.  He was first seen in April 2023.    The patient was referred to our clinic by General Surgery for reported significant LLQ abdominal pain near surgery site and continued high ostomy output but no other concerns after surgery.      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 His worst pain is related to rectal spasm and polyarthralgias, worst in the wrists (L > R) and bilateral hands. He did have XRAYs of the hands at [facility] in 2022 that were unremarkable.  He was started on lyrica due to initial concern for fibromyalgia and more neuropathic pain and is tolerating that well. He continues to follow with rheumatology for workup of arthritis (negative serologies, unlikely inflammatory arthritis) and is on methotrexate, previously prednisone (for UC prior to November surgery) but no longer on chronic steroids. He has done PT in the past and feels that he is as active (walking) as possible and benefits more from home exercises.     September 2023  At last visit in June, the patient reported overall worsened pain since he last visited. He endorses pain in his backed, neck, shoulders, and handed. Patient had followed withRheumatology, though workup had been thus far unremarkable. He discontinued Cymbalta due to intolerable dried mouth. Patient was hopeful for some relief as this pain has made worked difficult, as well as held objected and slept. Patient endorses pain was worse in the mornings and per outside provider has been wore braced on his handed at nighttime. We encouraged exercised and movement. Patient denies benefit from Voltaren gel and we discussed tried a compounded cream as patient is not responded to systemic medications for his symptoms. He is hopeful for some relief and amenable to tried the compounded cream. We collected a urine drug screen today as he might Have Been a candidate for Butrans patch in the future as he has failed several conservative options and given the severity of his pain and impact on his function. We also discussed increased his Lyrica to TID dosing for which he was agreeable. Counseled on reverting to previous dosage if over sedation became and issue.     Today, he reports his headache is worse than his joint/extremity pain and thinks it's related to the pregabalin dose adjustment. Tried the compounded cream for his hands but states it did not seem to help. The increased pregabalin dose also did not help with his pain. States he's still interested in butrans patch, UDS was negative last time. Given his pain has not improved with various medication trial at this point, reasonable to start the butrans  patch for severe, constant, uncontrolled pain. We discussed it also may be worth to follow up with Neurology for further evaluation of his hand/wrist pain given it's burning, possibly neuropathic in nature, especially given rheumatology does not think it's inflammatory at this point. While he had a negative EMG/NCS, it could still be small fiber peripheral neuropathy.  We reviewed red flag symptoms of his headache and encouraged him to reach out to his PCP if his headache does not get better or gets worse for further evaluation in case it is not related to the pregabalin at all.    Urine toxicology: 07/31/21  Urine toxicology screen appropriate  Last Opioid Change: Start butrans 10/2021  Opioid agreement-obtain today  Last EKG:  None  Previous Compliance Issues: None  Naloxone ordered: N/A  Total morphine equivalents: 0  Benzodiazepine: No.  Pain Psychology: Consider referral in the future  [place] DOC: None  [place]CCSRS database was reviewed 10/29/21.      Peripheral neuropathy of hands:  - Referral to Neurology for evaluation of bilateral wrist/hand pain, consideration of SFPN    Polyarthritis of unknown etiology - Bilateral Wrist Pain   - Continue following with Rheumatology    Chronic pain syndrome  - Start butrans patch 5 mcg/hour weekly.  If tolerating, can contact us before next follow up to increase to .  - Change Lyrica back to prior dose of 225 mg BID  - Continue home PT exercises and activity (enjoys walking)      Future Considerations:  - Right lumbar MBBs  - Consider muscle relaxant for rectal spasm    -Return for 2-3 months, MD, NP.    Orders Placed This Encounter   Procedures    Ambulatory referral to Neurology     Standing Status:   Future     Standing Expiration Date:   10/30/2022     Referral Priority:   Routine     Referral Type:   Generic Referral     Referral Reason:   Specialty Services Required     Number of Visits Requested:   1    Misc nursing order (specify)     Requested Prescriptions     Signed Prescriptions Disp Refills    pregabalin (LYRICA) 225 MG capsule 60 capsule 2     Sig: Take 1 capsule (225 mg total) by mouth Two (2) times a day.    buprenorphine 5 mcg/hour PTWK transdermal patch 4 patch 2     Sig: Place 1 patch on the skin every seven (7) days.     HPI:  Kevin Tran is seen in consultation at the request of Kevin Fraise, MD  For evaluation and recommendations regarding His chronic pain.     Patient was last seen in June. Since then, the patient has followed with Rheumatology.     Today, the patient reports headache that started a few months ago ever since he increased his lyrica dose. He's been off the methotrexate for 4 weeks now per rheumatology's instructions as they do not think this is an inflammatory arthropathy. States his pain in his joints/extremties is about the same as last time, mostly burning in his forearms, wrists and hands. He states he's been having intermittent blurry vision and feels groggy.     In terms of medications, feels the pregabalin increase did not help his pain.     Current Medications Regime:  Lyrica 200 mg TID    From intake paperwork:  The patient states his pain is  located head, hands, shoulders, arms and the severity of his pain ranges from 6/10 to 5/10.  His pain currently is 6/10 and on average is 6/10. He describes the sensation of his pain as aching, burning, sharp, shooting. His pain is present all of the time and worst mornings. The patient???s pain impacts enjoyment of life, general activity, mood, normal work, recreational activities, sleep, sitting. His interval history includes None. His pain is worse, and he does have new pain to discuss today. He is not on blood thinners or anti-coagulants. In regards to medications currently taken for pain management, the patient is tolerating these medications well and complains of associated side effects: headache     Current view: Showing all answers           Op Gct Opt Outs       Question 07/30/2021 10:00 PM EDT - Filed by Patient    Would you like to be excluded from the patient list? Yes    Would you like for [name] to withhold information about you from your family and friends? No    Would you like for [name] to withhold information about you from community clergy? Yes          The treatment goals include Complete resolution with medications if necessary, Ability to return to some type of employment, and Ability to return to previous daily routine and activity    Previous Medication Trials: Diclofenac gel/voltaren gel, duloxetine/cymbalta (side effects), gabapentin, methocarbamol/robaxin, Naproxen/Aleve, and Pregabalin/Lyrica, compounded topical (not helpful), butrans     Prior interventions include medications, PT.   Referral to neurology 10/2021    Prior imaging include MRI, XR, CT, Nerve Conduction Study.     The treatment goals include Complete resolution with medications if necessary, Ability to return to some type of employment, and Ability to return to previous daily routine and activity     Allergies as of 10/29/2021    (No Known Allergies)      Current Outpatient Medications   Medication Sig Dispense Refill    acetaminophen (TYLENOL) 500 MG tablet Take 2 tablets (1,000 mg total) by mouth every six (6) hours as needed for pain.      loperamide (IMODIUM) 2 mg capsule Take 1 capsule (2 mg total) by mouth Two (2) times a day. 180 capsule 1    melatonin 3 mg Tab Take 1 tablet (3 mg total) by mouth nightly as needed.  0    multivitamin (TAB-A-VITE/THERAGRAN) per tablet Take 1 tablet by mouth daily.      omeprazole (PRILOSEC OTC) 20 MG tablet Take 1 tablet (20 mg total) by mouth daily.      buprenorphine 5 mcg/hour PTWK transdermal patch Place 1 patch on the skin every seven (7) days. 4 patch 2    pregabalin (LYRICA) 225 MG capsule Take 1 capsule (225 mg total) by mouth Two (2) times a day. 60 capsule 2     No current facility-administered medications for this visit.     Imaging/Tests:   XR SIJ 03/22/21  FINDINGS:   Symmetric sacroiliac joints. Mild periarticular sclerosis, similar to the prior CT pelvis 03/16/2021 and likely degenerative. No erosions. Mild arthrosis at the symphysis pubis. Visualized joints are preserved.   IMPRESSION  No radiographic findings of inflammatory arthropathy.     CT Pelvis 03/16/21  FINDINGS:       BLADDER: Partially distended, otherwise unremarkable.       PELVIC/REPRODUCTIVE ORGANS: Borderline enlarged prostate, measuring up to 4.8 cm.  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 The rectal stump is unremarkable.       PERITONEUM/RETROPERITONEUM AND MESENTERY: No free air. Mild interval decrease in previously described small volume of loculated fluid in the dependent pelvis/presacral space (5:67).       LYMPH NODES: No enlarged lymph nodes.       VESSELS: Scattered calcified and noncalcified atheromatous plaques of the abdominal aorta and its branch vessels, which appear normal in caliber.       BONES AND SOFT TISSUES: Multilevel degenerative disc disease. No suspicious osseous lesions. Postsurgical changes of the anterior abdominal wall. Question tiny soft tissue tract extending to the left perianal soft tissues at approximately the 5:00 position (2:53). No drainable perianal fluid collection.   IMPRESSION  -- Sequelae of prior total colectomy with left lower quadrant end ileostomy. Mild interval decrease in previously described small volume partially loculated fluid in the dependent pelvis/presacral space.   --Tiny soft tissue tract extending through the left perianal soft tissues at approximately the 5:00 position, which may represent an old scarred down perianal fistulous tract. No drainable perirenal fluid collection.     CT Abdomen 01/21/21  FINDINGS:       LOWER CHEST: Redemonstrated right lower lobe subsegmental atelectasis versus scarring. Left-sided gravity dependent atelectasis. Otherwise unremarkable.       LIVER: Normal liver contour.  No focal liver lesions. Small volume focal fat along the falciform and gallbladder fossa, unchanged from 03/27/20.       BILIARY: No biliary ductal dilatation.  The gallbladder is physiologically distended and normal in appearance.       SPLEEN: Normal in size and contour.       PANCREAS: Normal pancreatic contour.  No focal lesions.  No ductal dilation.       ADRENAL GLANDS: Normal appearance of the adrenal glands.       KIDNEYS/URETERS: Symmetric renal enhancement.  No hydronephrosis.  No nephrolithiasis.       BLADDER: Physiologically distended and otherwise unremarkable.       REPRODUCTIVE ORGANS: Unremarkable.       GI TRACT: Postsurgical sequela of total colectomy with right lower quadrant ileostomy. Contrast opacifies the stomach and majority of the small bowel, but does not extend to the ileostomy site, likely due to contrast timing. There is also absence of contrast opacification in the right lower quadrant, although there are no abnormally dilated loops of proximal small bowel, and this is likely secondary to contrast timing. Rectal stump decompressed. No findings of acute inflammation.  Surgically absent appendix.       PERITONEUM, RETROPERITONEUM AND MESENTERY: There is mild swirling of the mesentery in the right lower quadrant, likely post-operative. No free air.  No ascites.  Small volume of simple free fluid in the dependent pelvis, likely normal postsurgical sequela.       LYMPH NODES: No adenopathy.       VESSELS: Hepatic and portal veins are patent.  Normal caliber aorta.  Moderate scattered atherosclerotic calcifications of the abdominal aorta and its branching vessels. Accessory left renal vein arising from the inferior pole.       BONES and SOFT TISSUES: Right lower quadrant ileostomy changes and additional anterior abdominal wall postsurgical changes. No focal rim-enhancing fluid collection. No aggressive osseous lesions.  No focal soft tissue lesions. Multilevel degenerative changes of the visualized spine, most severe at L5-S1 with loss of intervertebral disc space and vacuum disc phenomenon.   IMPRESSION  Sequela of  interval total colectomy with right lower quadrant ileostomy. Loculated fluid is identified in the presacral region, however, there is no evidence of discrete wall enhancement to suggest abscess formation at this time.       Additional chronic and incidental findings as above.     XRAY hand results in care everywhere 10/2020    Lab Results   Component Value Date    CREATININE 1.1 03/16/2021     Lab Results   Component Value Date    ALKPHOS 67 11/24/2020    BILITOT 0.7 11/24/2020    BILIDIR 0.10 04/30/2018    PROT 7.5 11/24/2020    ALBUMIN 4.1 11/24/2020    ALT 36 09/10/2021    AST 21 09/10/2021     Lab Results   Component Value Date    PLT 265 09/10/2021     Urine toxiciology screen  No results found for: AMPHU, BARBU, BENZU, CANNAU, METHU, OPIAU, COCAU    OPIOID CONFIRMATION:  Lab Results   Component Value Date    C difficile Toxins A+B, EIA Negative 06/09/2018      BENZODIAZEPINE CONFIRMATION:  Lab Results   Component Value Date    C difficile Toxins A+B, EIA Negative 06/09/2018     Review of Systems:  General fatigue, difficulty sleeping  Cardiovascular none  Gastrointestinal heartburn and gastric reflux  Skin none  Endocrine none  Musculoskeletal spasms/spacticity/cramps, back pain  Neurologic dizziness/vertigo, migraines/headaches  Psychiatric anxiety    He denies any homicidal or suicidal ideation.     OBJECTIVE  PHYSICAL EXAM:  BP 134/83  - Pulse 70  - Temp 36.6 ??C (97.8 ??F)  - Ht 172.7 cm (5' 8)  - Wt 79 kg (174 lb 3.2 oz)  - SpO2 99%  - BMI 26.49 kg/m??   Wt Readings from Last 3 Encounters:   10/29/21 79 kg (174 lb 3.2 oz)   10/03/21 78.1 kg (172 lb 3.2 oz)   07/31/21 76.7 kg (169 lb)     GENERAL:  The patient is a well developed, well-nourished male and appears to be in no apparent distress. The patient is pleasant and interactive. Patient is a good historian.  No evidence of sedation or intoxication.  HEAD/NECK:    Reveals normocephalic/atraumatic. clear sclera.     CARDIOVASCULAR:   Warm, well perfused  RESPIRATORY:   Normal work of breathing  EXTREMITIES:  No clubbing, cyanosis noted.  NEUROLOGIC:    The patient was alert and oriented, speech fluent, normal language. CN 2-12 grossly intact.    MUSCULOSKELETAL:    Motor function  5/5 in upper and lower extremities.   GAIT:  The patient rises from a seated position with no difficulty.  The patient was able to ambulate with no difficulty throughout the clinic today without the assistance of a walking aid-None.   SKIN:   No obvious rashes, lesions, or erythema.  PSY:  Appropriate Affect    I agree with the scribe's documentation above, and have made changes, where appropriate.    Ladona Mow, MD  [facility], PGY-3/CA-2    Medical Decision Making    Problems Addressed:  1 or more chronic illness w/ exacerbation, progression, or side effects of treatment (moderate)    Amount/Complexity:  Reviewed external records:  [facility]he[facility] 10/03/21. and [place] PDMP (today on day of visit)  Reviewed results:  XRAY hands 10/03/21.  AST/ALT 09/10/21-OK.  Ordered tests:  None  Assessment requiring independent historian: None  Discussion of management/test results with medical professional: None  Independent interpretation of test: None    Risk  Moderate: Prescription Drug Management, Minor surgery w/ risk factors, Elective Major Surgery w/o risk factors, Diagnosis/treatment significantly limited by social determinants of health

## 2021-11-26 DIAGNOSIS — K51919 Ulcerative colitis, unspecified with unspecified complications: Principal | ICD-10-CM

## 2021-12-06 ENCOUNTER — Ambulatory Visit: Admit: 2021-12-06 | Discharge: 2021-12-06 | Payer: PRIVATE HEALTH INSURANCE

## 2021-12-06 ENCOUNTER — Institutional Professional Consult (permissible substitution)
Admit: 2021-12-06 | Discharge: 2021-12-06 | Payer: PRIVATE HEALTH INSURANCE | Attending: Rheumatology | Primary: Rheumatology

## 2021-12-06 DIAGNOSIS — M159 Polyosteoarthritis, unspecified: Principal | ICD-10-CM

## 2021-12-06 DIAGNOSIS — K51918 Ulcerative colitis, unspecified with other complication: Principal | ICD-10-CM

## 2021-12-06 NOTE — Unmapped (Addendum)
Kevin Tran is a 61 y.o. man with a history of ulcerative colitis who presents for ultrasound evaluation of bilateral hand pain. He states that the pain is a burning sensation in his bilateral wrists.     Study Type: Complete Diagnostic scan performed according to EULAR/OMERACT guidelines (1,2).  Indication:  Bilateral Hand Pain  Diagnosis code:    Diagnosis ICD-10-CM Associated Orders   1. Ulcerative colitis, acute, other complication (CMS-HCC)  K51.918 US Extremity Nonvasc Comp (Joint,Muscle,Tendon,Ligament) Bilat        Study Site: Right 2nd MCP, PIP, and DIP and right wrist   Equipment: Sonosite Xporte with linear transducer    Findings:  Orthogonal views of the right 2nd MCP, PIP, and DIP and right wrist were obtained in gray scale and with color power Doppler and revealed bony enlargement of the right second 2nd PIP and DIP with associated osteophytes and without activity on color power Doppler. The questionable erosion on xray appeared to be cystic or potentially an old erosion with secondary osteophytosis;  no effusion or PD observed. Right median nerve measured to be 0.14cm2.     Right 2nd DIP        Right 2nd PIP        Right 2nd MCP                        Right wrist  Dorsal long        Dorsal trans      Radial and ulnar long        Volar          The remainder of visualized muscle, tendon, joint, bone and soft tissue were without any abnormalities.    Impressions: Evidence of osteoarthritis with bony enlargement and osteophytes in the small joints of the right 2nd digit. No evidence of active synovitis. Slightly enlarged right median nerve suggests possible neuropathic component to hand pain.    Images are stored in the Division of Rheumatology.  CPT [phone]    References:  1. Backhaus et al. Guidelines for musculoskeletal ultrasound in rheumatology.  Ann Rheum Dis [phone].  2. Galen Manila al. Musculoskeletal ultrasound including definitions for ultrasonographic pathology.  J Rheumatol 8546;27:0350-0.

## 2021-12-06 NOTE — Unmapped (Signed)
Rheumatology return visit    REASON FOR VISIT: Follow-up arthralgia    HISTORY: Kevin Tran is a 61 y.o. male with a longstanding history of arthralgia in the setting of ulcerative colitis.  Initially referred by his gastroenterologist for evaluation for seronegative arthritis.  Joint pain preceded ulcerative colitis.  Previously treated with infliximab, Humira, Xeljanz, methotrexate, all without improvement.  Started methotrexate around 2022, established care with [facility] rheumatology 03/2021.  Methotrexate discontinued 09/2021 due to lack of efficacy for arthralgia.    Presents today for follow-up.  He has an appointment for diagnostic ultrasound of hands following his appointment with me today.  He does not note much change in his symptoms since stopping methotrexate, though he does feel he hurts more in the shoulders and elbows in the interim.  Persistent pain in the hands bilaterally.  Denies paresthesias.  Describes a burning pain in both hands.  Morning stiffness lasting 30 to 60 minutes.  Cannot hold anything in the morning due to the hands feeling stiff.  No fevers, chills.  No interim illness or hospitalization.    He has previously been told that he should not take NSAIDs due to his ulcerative colitis, but is now status post colectomy.    He is also followed by pain clinic, and has been referred to neurology for consideration of small fiber neuropathy.  It appears he is scheduled for both EMG and office visit with neurology next month.    CURRENT MEDICATIONS:  Current Outpatient Medications   Medication Sig Dispense Refill    acetaminophen (TYLENOL) 500 MG tablet Take 2 tablets (1,000 mg total) by mouth every six (6) hours as needed for pain.      buprenorphine 5 mcg/hour PTWK transdermal patch Place 1 patch on the skin every seven (7) days. 4 patch 2    loperamide (IMODIUM) 2 mg capsule Take 1 capsule (2 mg total) by mouth Two (2) times a day. 180 capsule 1    melatonin 3 mg Tab Take 1 tablet (3 mg total) by mouth nightly as needed.  0    multivitamin (TAB-A-VITE/THERAGRAN) per tablet Take 1 tablet by mouth daily.      omeprazole (PRILOSEC OTC) 20 MG tablet Take 1 tablet (20 mg total) by mouth daily.      pregabalin (LYRICA) 225 MG capsule Take 1 capsule (225 mg total) by mouth Two (2) times a day. 60 capsule 2     No current facility-administered medications for this visit.       Past Medical History:   Diagnosis Date    Arthritis     Pneumothorax     Ulcerative colitis (CMS-HCC)         Record Review: Available records were reviewed, including pertinent office visits, labs, and imaging.      REVIEW OF SYSTEMS: Ten system were reviewed and negative except as noted above.    PHYSICAL EXAM:  VITAL SIGNS:   Vitals:    12/06/21 1421   BP: 140/79   BP Site: L Arm   BP Position: Sitting   BP Cuff Size: Small   Pulse: 72   Weight: 77 kg (169 lb 12.8 oz)   Height: 172.7 cm (5' 8)     General:   Pleasant 61 y.o.male in no acute distress, WDWN   Cardiovascular:  Regular rate and rhythm. No murmur, rub, or gallop. No lower extremity edema.    Lungs:  Clear to auscultation.Normal respiratory effort.    Musculoskeletal:   General: Ambulates  w/o assistance   Hands: No localized swelling or tenderness.  Unable to make a tight fist.  Negative prayer sign.  Wrists: Limited extension bilaterally without swelling or tenderness.  Elbows: FROM w/o swelling or tenderness   Shoulders: FROM w/o pain   Knees: FROM w/o effusions    Psych:  Appropriate affect and mood   Skin:  No rashes.         ASSESSMENT/PLAN:  1. Primary osteoarthritis involving multiple joints  Patient received diagnostic ultrasound with Dr. Delton See after his visit with me, and I returned to discuss the findings with him.  Ultrasound revealed findings consistent with osteoarthritis and slightly enlarged right median nerve possibly related to carpal tunnel syndrome.  No synovitis on ultrasound.  The findings were discussed with patient.  We discussed the diagnosis of osteoarthritis.  Discussed the lack of disease modifying medications for osteoarthritis, the only disease modifying therapy is physical activity.  Discussed treatment of pain from osteoarthritis to include Tylenol, NSAIDs.  Previously has been told that he should not take oral NSAIDs due to his ulcerative colitis, but now that he has had a colectomy it is unclear if he still has these restrictions.  He has had no relief with topical Voltaren gel or Tylenol.  It appears Dr Olean Ree reached out to patient's gastroenterologist ask if he can use oral NSAIDs, but I do not know what the answer from the gastroenterologist was.  Asked patient to reach out to his gastroenterologist to see if we are able to use oral NSAIDs, and if so I am happy to write this for him.  He will continue follow-up with pain clinic and will continue with neurology eval as scheduled.      HCM:   - COVID-19 vaccine status: Did not discuss  - Annual Influenza vaccine. Status: 12/06/2021  - Bone health: not on prednisone         Return appointment in 4 months with Dr Olean Ree     Greater than 30 minutes spent in visit with patient, including pre and postvisit activities.

## 2021-12-07 NOTE — Unmapped (Signed)
RE: methotrexate  Received: 1 month ago  [name], MD  Clearnce Hasten, MD  Thanks so much, appreciate you seeing him!  I was mainly using the methotrexate for joints and not for his gut.      If there doesn't appear to be an inflammatory arthritis, I can wean him off it and see how he does.      I would be ok with Celebrex, but preferrably shorter term or PRN use.    Thanks so much for the help with him, he's had a rough course!    [name]          Previous Messages       ----- Message -----  From: Clearnce Hasten, MD  Sent: 10/03/2021  12:46 PM EDT  To: [name], MD  Subject: methotrexate                                    Hi Kevin Tran,  I evaluated [name] twice. He does not have an inflammatory arthritis on exam. Was the methotrexate prescribed for his IBD or for joint pain?  The patient is not certain why he is on it and, based on my exam he does not seem to need it. I will evaluate him further for an inflammatory arthritis with x-rays and ultrasound but, if they are negative, he would need to stay on the MTX for arthritis unless you are using it for his IBD.  Also, can he take something like celebrex for joint pain if needed now that he has undergone TAC? I appreciate you input.    Margurite Auerbach

## 2021-12-08 NOTE — Unmapped (Signed)
-----   Message from [name], MD sent at 12/07/2021  3:47 PM EDT -----  Regarding: RE: methotrexate  Sounds good, I think judicious use of Celebrex is fine.  Keep me updated anytime as needed.     Sincerely,  [name], MD  [facility]  Division of Gastroenterology & Hepatology    Clinical Nurse Contact: Christy Gentles, RN    Phone: [phone] (direct line)     Fax: [phone]    For clinic appointments, please call, [phone], option 1.  For questions regarding radiology appointments, or to schedule, [phone].  For questions regarding scheduling GI procedures (e.g,. Colonoscopy), please call, [phone], option 2.    For educational material and resources:  [email or web address]     ----- Message -----  From: Clearnce Hasten, MD  Sent: 12/07/2021  10:28 AM EDT  To: [name], MD  Subject: RE: methotrexate                                 Thank you so much. The MSK US showed OA.  No synovitis.  We will consider prn celebrex if that's Ok with you.  Ronie Fleeger  ----- Message -----  From: [name], MD  Sent: 10/10/2021  12:26 PM EDT  To: Clearnce Hasten, MD  Subject: RE: methotrexate                                 Thanks so much, appreciate you seeing him!  I was mainly using the methotrexate for joints and not for his gut.      If there doesn't appear to be an inflammatory arthritis, I can wean him off it and see how he does.      I would be ok with Celebrex, but preferrably shorter term or PRN use.     Thanks so much for the help with him, he's had a rough course!    [name]  ----- Message -----  From: Clearnce Hasten, MD  Sent: 10/03/2021  12:46 PM EDT  To: [name], MD  Subject: methotrexate                                     Hi Animesh,  I evaluated Mr. Matuszak twice. He does not have an inflammatory arthritis on exam. Was the methotrexate prescribed for his IBD or for joint pain?  The patient is not certain why he is on it and, based on my exam he does not seem to need it. I will evaluate him further for an inflammatory arthritis with x-rays and ultrasound but, if they are negative, he would need to stay on the MTX for arthritis unless you are using it for his IBD.  Also, can he take something like celebrex for joint pain if needed now that he has undergone TAC? I appreciate you input.     Margurite Auerbach

## 2021-12-12 DIAGNOSIS — M159 Polyosteoarthritis, unspecified: Principal | ICD-10-CM

## 2021-12-12 MED ORDER — CELECOXIB 200 MG CAPSULE
ORAL_CAPSULE | Freq: Every day | ORAL | 1 refills | 90 days | Status: CP | PRN
Start: 2021-12-12 — End: 2022-12-12

## 2021-12-12 NOTE — Unmapped (Signed)
-----   Message from Clearnce Hasten, MD sent at 12/07/2021 10:26 AM EDT -----  Regarding: FW: methotrexate  Hi Danne Harbor,   Thank you for seeing him.  This is what the gastroenterologist replied.   Dr. Elvera Lennox.  ----- Message -----  From: [name], MD  Sent: 10/10/2021  12:26 PM EDT  To: Clearnce Hasten, MD  Subject: RE: methotrexate                                 Thanks so much, appreciate you seeing him!  I was mainly using the methotrexate for joints and not for his gut.      If there doesn't appear to be an inflammatory arthritis, I can wean him off it and see how he does.      I would be ok with Celebrex, but preferrably shorter term or PRN use.     Thanks so much for the help with him, he's had a rough course!    [name]  ----- Message -----  From: Clearnce Hasten, MD  Sent: 10/03/2021  12:46 PM EDT  To: [name], MD  Subject: methotrexate                                     Hi Animesh,  I evaluated Mr. Norfleet twice. He does not have an inflammatory arthritis on exam. Was the methotrexate prescribed for his IBD or for joint pain?  The patient is not certain why he is on it and, based on my exam he does not seem to need it. I will evaluate him further for an inflammatory arthritis with x-rays and ultrasound but, if they are negative, he would need to stay on the MTX for arthritis unless you are using it for his IBD.  Also, can he take something like celebrex for joint pain if needed now that he has undergone TAC? I appreciate you input.     Margurite Auerbach

## 2021-12-12 NOTE — Unmapped (Signed)
Called pt to discuss conversation below between Dr Olean Ree and Dr Raphael Gibney. GI cleared pt for use of short term or prn celebrex. Pt agreed to trail of celebrex prn. Sent to local pharmacy.

## 2021-12-12 NOTE — Unmapped (Signed)
RE: methotrexate  Received: 2 days ago  Staci Righter, [place]  Madaline Lefeber, Debe Coder, MD  Thanks for sending to me, so it sounds like he should not be on daily celebrex for his pain. I told the pt to discuss use of celebrex with Dr Raphael Gibney, and if Dr Raphael Gibney was ok with it, I could write celebrex for him. But I am reading this to suggest that I should not be writing celebrex for management of his daily pain. Am I right about this?          Previous Messages       ----- Message -----  From: Clearnce Hasten, MD  Sent: 12/07/2021  10:27 AM EDT  To: Staci Righter, PA  Subject: FW: methotrexate                                Hi Danne Harbor,  Thank you for seeing him.  This is what the gastroenterologist replied.   Dr. Elvera Lennox.  ----- Message -----  From: [name], MD  Sent: 10/10/2021  12:26 PM EDT  To: Clearnce Hasten, MD  Subject: RE: methotrexate                                Thanks so much, appreciate you seeing him!  I was mainly using the methotrexate for joints and not for his gut.      If there doesn't appear to be an inflammatory arthritis, I can wean him off it and see how he does.      I would be ok with Celebrex, but preferrably shorter term or PRN use.    Thanks so much for the help with him, he's had a rough course!    [name]  ----- Message -----  From: Clearnce Hasten, MD  Sent: 10/03/2021  12:46 PM EDT  To: [name], MD  Subject: methotrexate                                    Hi Animesh,  I evaluated Mr. Bruening twice. He does not have an inflammatory arthritis on exam. Was the methotrexate prescribed for his IBD or for joint pain?  The patient is not certain why he is on it and, based on my exam he does not seem to need it. I will evaluate him further for an inflammatory arthritis with x-rays and ultrasound but, if they are negative, he would need to stay on the MTX for arthritis unless you are using it for his IBD.  Also, can he take something like celebrex for joint pain if needed now that he has undergone TAC? I appreciate you input.    Margurite Auerbach

## 2021-12-24 ENCOUNTER — Ambulatory Visit: Admit: 2021-12-24 | Discharge: 2021-12-24 | Payer: PRIVATE HEALTH INSURANCE

## 2021-12-24 DIAGNOSIS — G629 Polyneuropathy, unspecified: Principal | ICD-10-CM

## 2021-12-24 DIAGNOSIS — R202 Paresthesia of skin: Principal | ICD-10-CM

## 2021-12-24 DIAGNOSIS — M542 Cervicalgia: Principal | ICD-10-CM

## 2021-12-24 DIAGNOSIS — G609 Hereditary and idiopathic neuropathy, unspecified: Principal | ICD-10-CM

## 2021-12-24 LAB — SEDIMENTATION RATE: ERYTHROCYTE SEDIMENTATION RATE: 27 mm/h — ABNORMAL HIGH (ref 0–20)

## 2021-12-24 LAB — C-REACTIVE PROTEIN: C-REACTIVE PROTEIN: <4 mg/L (ref <=10.0)

## 2021-12-24 NOTE — Unmapped (Addendum)
Outpatient Neurology Consult Note     MMNT [address]  [facility] [address] [place]  [address]  [name] [address]  [phone]    Date: 12/25/21  Patient Name: Kevin Tran  MRN: [id number]  PCP: Chriss Czar, PA      ASSESSMENT        Mr. Kimberling is a 61 year old man with long history of ulcerative colon disease, status post colectomy with persistent colostomy, presents with bilateral wrist and hand pain for more than 2 years.  The pain is severe (6 on a scale of 1-10 today) and not responsive to much medications including narcotics and pregabalin.  Examination shows severe sensory loss to pinprick and temperature bilateral arms (somewhat nondermatomal) with preserved reflexes and strength except for mild restriction in opposition.  Suggestion of possible suspended sensory level (C4-5-T1) is noted.  Small fiber neuropathy/neuronopathy is in the differential, musculoskeletal (arthralgia/arthritis related to his rheumatoid arthritis) or is more likely.  Given his light headedness on standing, autonomic dysfunction needs to be excluded.  Entrapment neuropathy and cervical radiculopathy will need to be excluded.  Syrinx is also in the differential diagnosis.     PLAN       I recommended nerve conduction and EMG studies along with skin biopsy.  I also recommended MRI of the cervical spine to rule out a syrinx.  I encouraged him to continue to follow-up with the pain clinic.  We discussed doing the following blood studies including genetic testing for amyloid.      Today, I personally spent 70 minutes face-to-face and non-face-to-face in the care of this patient, which includes all pre, intra, and post visit time on the date of service.      The patient was told to call back at [phone] in 2-3 weeks to discuss the results of testing and need for further visits or consultation.     Orders Placed This Encounter   Procedures    Dysautonomia, Autoimmune/Paraneoplastic Eval, Serum    Referral Laboratory Test, Other    Anti-neutrophilic Cytoplasmic Antibody (ANCA)    Sedimentation rate, manual    C-reactive protein (CRP)    Extractable Nuclear Antigen Screen (ENA)    Miscellaneous DNA Sendout            HISTORY       Chief Concern: Kevin Tran is a 61 y.o. male who comes for consultation and evaluation from Chriss Czar, [place] regarding No chief complaint on file.  Marland Kitchen    History of Present Illness: The history is obtained from the medical record, patient, and his wife who is here with patient today.     He has had a long history of refractory ulcerative colitis (since 2018) with bloody diarrhea and incontinence.  After multiple failure of medical treatments (steroids, mesalamine, humira, 6 MP, Vesta Mixer, stelara, and remicade) for his ulcerative colitis, he underwent robotic total abdominal colectomy with and Ileostomy on 07/02/2020.  This was followed up by attempt to completion of proctectomy on 12/19/20; this was not possible because of ischemia of the small bowel.   Segments and end ileostomy created.  Although he still has abdominal pain, he is gaining some weight.  He is now referred for evaluation of burning pain in his wrist and diffuse pain in his hands and shoulders along with chronic low back pain (status post lumbar laminectomy 1996) and left knee pain.    He notes that the pain  in his wrist, hands, and shoulders started about 2 years ago.  The pain is worse when he wakes up in the morning when he is unable to use the hands.  It gets better with as the day goes on but he is functionally quite limited because of the pain.  His hands feel stiff and sometimes lock up.  He denies any swelling or redness associated with this pain.  Both hands are equally affected.  Although he feels the pain is deep, the burning sensation in his wrist appears more superficial.  The pain level is 6-7 on a scale of 1-10.  He denies associated numbness. No other precipitating or relieving features are noted.  He does not feel that the pain is particularly getting worse over the years.  He started wearing gloves (braces) which worked for a while and then stopped working.  Sometimes hot water helps reduce the stiffness.  He thinks that methotrexate may have helped with all his joint pains but not to a great degree.  He is on pregabalin and is also being seen in the pain clinic with buprenorphine treatment.  This helps some but not to a great extent.  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  He denies any muscle loss, muscle twitching, or cramps..      He notices pain in the bottom of his feet.  When he is being on his feet for a long time.  Otherwise he has no symptoms in the lower extremities related to tingling or weakness.  He denies bladder incontinence.  He has a pouch for his ileostomy/colostomy.      Outside records and prior workup has been reviewed (see Media section).     Past Medical History: He  has a past medical history of Arthritis, Pneumothorax, and Ulcerative colitis (CMS-HCC).He had lung surgery, pneumothorax.  Laminectomy 1996.  Low back pain never went away.     Family History: His family history includes Cancer in his mother.His sister has fibromyalgia. 8 siblings.  One sister had stroke and one brother had an MI.  Father died at 85.  Mother died at 1 with lung cancer.     Social history: He  reports that he quit smoking about 17 years ago. His smoking use included cigarettes. He has never been exposed to tobacco smoke. He has never used smokeless tobacco. He reports that he does not currently use alcohol. He reports that he does not use drugs.  He is a Nutritional therapist.  Not been able to work. Unable to do his physical work. Quit smoking 30 years.  On and off for 2 years of smoking.  Alcohol beer a day.  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 Medications: He is on the following medications: has a current medication list which includes the following prescription(s): acetaminophen, buprenorphine, celecoxib, folic acid, loperamide, multivitamin, omeprazole, pregabalin, tumeric-ging-olive-oreg-capryl, and melatonin.    Medication Reconciliation: In conjunction with entering the patient's initial or transfer medications, I performed medication reconciliation based on information available to me at the time.    Allergies: He has No Known Allergies.    Review of Systems: 11+ review of systems was reviewed. Please see scanned document for details. Significant positives and negatives are detailed in the HPI. He has gained weight- 172 lbs.  Bottom of the feet hurt- if he walks a lot. 1-2 hrs      Data  Review:   ULTRASOUND: Evidence of osteoarthritis with bony enlargement and osteophytes in the small joints of the right 2nd digit. No evidence of active synovitis. Slightly enlarged right median nerve suggests possible neuropathic component to hand pain.     11/29/20 EMG: Normal study. There is no electrodiagnostic evidence of a large fiber neuropathy in the hands.     07/23/16 MRI C spine.  Small disc protrusion at C5-6. No significant spinal canal or foraminal narrowing.     EXAMINATION     Vital Signs :His height is 172.7 cm (5' 8) and weight is 78 kg (172 lb). His blood pressure is 122/80 and his pulse is 66. His respiration is 16.   Pain score-       12/24/21 0912   PainSc: 6    /10    Physical Examination:  General: Well developed, well-nourished in no acute distress.  HEENT: normocephalic, atraumatic, no oral lesions or tongue lacerations, mucous membranes are moist  CV: RRR with no murmer appreciated.  Lungs: CTA bilaterally, no significant wheezes, rhonchi, or rales.  Ext: No edema, nontender, no ulcer. Warm, well perfused. There is no evidence of pes   cavus or hammer toes.   Skin: No significant rashes.  Neck: Supple.     Neurological Examination:    Mental Status:   Patient is awake, alert, and appropriately oriented. Attentive to examiner, cooperative with exam. Language is fluent with normal comprehension and repetition. Fund of knowledge is normal. Memory is intact. Patient is able to give details of his own history.  Speech is normal without any evidence of dysarthria.    Cranial nerves:    II: fundoscopic exam is unremarkable. Visual fields are full. Pupils are equal, round and reactive to light.  III, IV and VI: extraocular movements are full. There is no nystagmus.  V: Facial sensation is intact and symmetric.  VII: The face is strong and symmetric.  VIII: Hearing is grossly intact.  IX and X: Soft palate elevates symmetrically in the midline.  XI: Shoulder shrug and sternocleidomastoids are strong.  XII: Tongue is midline, normal movement, no atrophy or fasciculations.    Motor Examination:   Muscle tone is normal.  There is no evidence of atrophy or fasciculations.    Strength evaluation:      MUSCLE Right (MRC Grade) Left (MRC  Grade) Right (lbs) Left (lbs)   Neck Flexor 5      Neck Extensor 5      Arm abductor 5 5     Elbow extensor 5 5     Elbow flexor  5 5     Wrist extensor 5 5     Wrist flexor 5 5     Finger flexor  5 5     Finger extensor 5 5     Finger abduction 5 5     Thumb abduction 5 5 Oppostion weak  Pain limited  Opposition  weak pain limited   Hip flexor  5 5     Hip extensor  5 5     Knee extensor  5 5     Knee flexor  5 5     Ank Dorsiflexor  5 5     Ank Plantarflexor  5 5     Toe Extensors       Toe Flexors       Other       Reflexes:    Reflexes Right Left Comments   Biceps 2+ 2+  Triceps 2+ 2+    Brachioradialis 2+ 2+    Patella 2+ 2+    Achilles 2+ 2+    Plantar Response Flexor Flexor      Sensory:  Pinprick sensation is reduced/absent in the UE upto the shoulder.  Possible suspended sensory level C5-T1 is noted.   Temperature sensation is similar to pain.   Vibration sense is normal.   Proprioception is normal at the toes.  There is no sensory level noted.    Coordination and Gait:  Finger to nose is intact without dysmetria or tremors. Rapid alternating movements are intact without dysrhythmia. Fine motor control is normal.     Romberg sign is absent. Felt dizzy standing up.  BP was measured for orthostasis 127/78 HR 57 supine and on standing 119/79 HR 68.    Gait is narrow-based and steady, with normal arm swing. Tandem gait is intact. Heis able to rise on toes and heels.  He is able to get up from a low chair without difficulty.       Please note that portions of this note may be dictated using Dragon natural speaking voice recognition software.  Variances in spelling and vocabulary are possible and unintentional.

## 2021-12-25 ENCOUNTER — Ambulatory Visit: Admit: 2021-12-25 | Discharge: 2021-12-26 | Payer: PRIVATE HEALTH INSURANCE

## 2021-12-25 DIAGNOSIS — K5130 Ulcerative (chronic) rectosigmoiditis without complications: Principal | ICD-10-CM

## 2021-12-25 DIAGNOSIS — Z79899 Other long term (current) drug therapy: Principal | ICD-10-CM

## 2021-12-25 DIAGNOSIS — K51919 Ulcerative colitis, unspecified with unspecified complications: Principal | ICD-10-CM

## 2021-12-25 LAB — EXTRACTABLE NUCLEAR ANTIGEN: ENA SCREEN: 0.2 ENA Units (ref <0.70)

## 2021-12-25 MED ORDER — DIPHENOXYLATE-ATROPINE 2.5 MG-0.025 MG TABLET
ORAL_TABLET | Freq: Four times a day (QID) | ORAL | 3 refills | 30 days | Status: CP | PRN
Start: 2021-12-25 — End: 2022-06-23

## 2021-12-25 NOTE — Unmapped (Signed)
[facility]  Clinical Neurophysiology Laboratory  [place], [name]         Patient: Kevin Tran Date of Birth: 1960/06/07  [facility] #: [id number] Handedness: Right  Sex: Male      Visit Date: 12/24/2021 1:44 PM  Age: 61 Years  Attending: Gasper Sells, MD   Resident/Fellow: Wayland Salinas, MD   Req Provider: Gasper Sells, MD   Current Height: 5 feet 8 inch  Chief complaint: Bilateral upper extremity pain  Patient presents with 2 years of burning pain of shoulders that has progressed to arms and hands bilaterally. He has a history of ulcerative colitis and total colectomy after onset of pain. He had a normal cervical spine MRI in 2018 that was ordered for right hand weakness.  Exam: patch pinprick loss of bilateral upper extremities and upper trunk anterior and posterior. 2+ reflexes throughout. Full strength in arms and legs.       Motor NCS      Nerve / Sites Muscle Latency Ref. Amplitude Ref. Dur. Distance Lat Diff Velocity Ref.     ms ms mV mV ms cm ms m/s m/s   R Median - APB      Wrist APB 3.65 ?4.40 7.5 ?4.2 5.15 8         Elbow APB 8.17  6.9  5.63 26 4.52 57.5 ?49.0   R Ulnar - ADM      Wrist ADM 2.90 ?4.20 12.2 ?5.1 5.65 8         B.Elbow ADM 6.25  11.5  5.77 19 3.35 56.6 ?49.0      A.Elbow ADM 8.31  11.3  6.06 13 2.06 63.0 ?49.0   R COMMON FIBULAR (PERON) - EDB      Ankle EDB 3.90 ?5.60 5.6 ?2.0 5.48 7.5         B. Fib Head EDB 11.08  5.3  5.65 30 7.19 41.7 ?38.0      A. Fib Head EDB 12.96  5.3  5.71 10 1.88 53.3 ?38.0   R Tibial - AH      Ankle AH 3.75 ?5.70 12.0 ?2.8 7.38 7.5         Knee AH 13.48  6.2  8.92 45 9.73 46.3 ?41.0       F  Wave      Nerve F min Ref.    ms ms   R Median - APB 28.4 ?31.0   R Ulnar - ADM 29.5 ?31.0   R COMMON FIBULAR (PERON) - EDB 52.3 ?56.0   R Tibial - AH 55.2 ?56.0       Sensory NCS      Nerve / Sites Rec. Site Peak Lat Ref. PP Amp Ref. Distance Vel.     ms ms ??V ??V cm m/s   R Median - Dig II (Antidromic)      Wrist Index 3.54 ?3.80 42.1 ?10.0 14 53   R Ulnar - Dig V (Antidromic)      Wrist Dig V 3.31 ?3.80 32.4 ?10.0 14 55   R Radial - Superficial (Antidromic)      Forearm Wrist 2.60 ?2.80 18.7  10 52   R Medial antebrachial cutaneous - (Antidromic)      Elbow Forearm 2.77  8.8  12 56   L Medial antebrachial cutaneous - (Antidromic)      Elbow Forearm 2.58  10.3  12 59   R Lateral antebrachial cutaneous - (Antidromic)      Elbow Forearm 2.67  33.5  12 57   L Lateral antebrachial cutaneous - (Antidromic)      Elbow Forearm 2.63  37.5  12 60   R Sural - (Antidromic)      Calf Ankle 4.13 ?4.20 10.5 ?5.0 14 43   R SUP FIBULAR (PERON) - Ankle      Lat leg Ankle 2.90 ?3.40 13.2 ?5.0 10 42       EMG Summary Table     Spontaneous MUP Recruitment Comment   Muscle Nerve Roots Fib PSW Fasc Other Amp Dur. PPP Pattern Other   R. First dorsal interosseous Ulnar C8-T1 None None None None N N N Normal None   R. Pronator teres Median C6-C7 None None None None N N N Normal None   R. Flexor carpi ulnaris Ulnar C7-T1 None None None None N N N Normal None   R. Triceps brachii Radial C6-C8 None None None None N N N Normal None   R. Deltoid Axillary C5-C6 None None None None N N N Normal None       Summary:  After identifying the patient in the waiting room and reviewing all appropriately available medical records, the patient was taken back to the examination room where the procedure was explained, the sites of examination were noted, the patient's questions were answered, and the patient's verbal consent for the procedure was obtained. Studies were performed at [facility] using a radiant warmer and [employer] Synergy EMG system.    Sensory conduction studies of right median, ulnar, radial, superficial fibular and sural nerves were normal. Medial and lateral antebrachial cutaneous nerves were present and symmetric bilaterally.     Motor conduction studies of right median, ulnar, common fibular and tibial nerves were normal. F-waves tested were normal.    EMG of selected muscles of right upper extremity was normal.    Conclusions:  Normal study.  Small fiber neuropathy can not be excluded.    Wayland Salinas, MD  - - - - - - - - - - - - -   Resident or Fellow    As the attending physician, my signature affirms that I personally participated in the clinical assessment of this patient, performed or reviewed all electrodiagnostic procedures, and prepared or reviewed the conclusions of this report.      - - - - - - - - - - - -  Attending Electromyographer    Gasper Sells, MD   Carmon Ginsberg. Distinguished Professor  Stage manager, Division of Neuromuscular Medicine

## 2021-12-25 NOTE — Unmapped (Addendum)
Increase your lomotil to 2 tablets up to 4 times a day as needed (8 tablets maximum in a day).   Let Dr. Raphael Gibney know if your scar pain is getting worse as we can try lidocaine patches.   We will place a referral for you at [facility].

## 2021-12-25 NOTE — Unmapped (Addendum)
[facility]   FOLLOW UP NOTE - INFLAMMATORY BOWEL DISEASE  12/25/2021    Demographics:  Kevin Tran is a 61 y.o. year old male    Diagnosis:  Ulcerative Colitis  Disease onset (yr):  2018  Age at onset:  > 36yr old (A3)  Location:  Left-sided (E2)  Behavior:  Colitis  Current Tight Control Scenario:   Maintenance = Biologic/small molecule          HPI / NOTE :     Interval Events:   1.  Last seen by me Feb 2023 (Video).  He was reluctant about getting a surgical second opinion at that time.   2.  Off Methotrexate, has seen Dr. Olean Ree in Rheumatology - started on celebrex in Nov 2023    HPI:   Patient reports high output from his ostomy immediately after eating, described as black liquid. Is now on celebrex per rheumatology but black stool output started before then. Output slows down if he does not eat or drink anything. He has had fewer issues with leakage as his stoma height is now seems higher, he switched appliances and has a better fit overall. Changes his appliance twice a week. He is taking lomotil 1 tab BID. Unsure whether he ever tried imodium. Unable to recall the last time he had a leak. Also reports new abdominal pain at each of his surgical scar sites which started a few weeks ago. 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 However he feels traumatized by his last surgery/attempt at pouch creation and is very reluctant at getting another surgery.     Abdominal pain (0-10): occasional, minimal  BM a day: empties ostomy bag 10x/day, half full each time  Consistency: watery   % of stools have blood: 0%  Nocturnal BM: NA ostomy  Urgency: NA, ostomy  Weight change over last 6 mo:  Stable ~155-160lb (not gaining but not losing)  Smoking: no  NSAIDS: avoids    Review of Systems:   Review of systems positive for: fatigue  Otherwise, the balance of 10 systems is negative           IBD HISTORY:     Year of disease onset:  2018    Brief IBD Disease Course:  In late 2018 developed bloody diarrhea and incontinence. Colonoscopy in Feb 2019 showed edema and erythema in the rectum and sigmoid with decreased vascularity in the rest of the colon; the TI appeared normal. He was treated with courses of prednisone for much of 2019 and also put on mesalamine without improvement. In Fall 2019 he was started on Humira w/o improvement. In Dec 2019 he had moderate level antibodies to humira; was added; mesalamine was stopped; put on prednisone taper; rapidly improved  - Jan 2020 - stopped mesalamine, used weekly Humira + with initial improvement.   - April 2020 - notable worsening of diarrhea and abdominal pain  Colonoscopy with moderate inflammation mainly in rectum.  Changed to [name] 06/2018.  Had limited response initially, but improved with Uceris foam 09/2018.    - 09/2018 - taper to xeljanz 5mg  BID.  Nov 2020 - ongoing proctitis symptoms, restarted Uceris foam.   - 06/2019 - severe UC flare on [name], plan to restage disease and change therapy  - 10/2019 - moderate proctitis (up to ~20cm), started canasa +  Stelara.  Good symptom response to Stelara initially, but had severe arthralgias.   - Feb 2022 - worsening UC flare despite Stelara.  Admitted to [facility], discharged on prednisone taper.  Recurrent symptoms, readmitted 1 week later, started on IV Remicade 5mg /kg in the hospital on 04/06/2020. Low trough level April 2022 - increased dose to 10mg /kg.    - May 2022 - poor response to high dose Remicade.  Colectomy 06/29/2020 ([facility]). Surg path - severe chronic active colitis c/w UC.    Endoscopy:      - Colonoscopy Feb 2019: edema + erythema in rectum and sigmoid; decreased vascularity in rest of colon  - Dec 2019: moderate inflammation throughout the colon  - Colonoscopy 07/01/2018 - poor prep, moderate proctitis up to 15cm, mild patchy nodularity in remaining colon.  Normal terminal ileum.   - Colonoscopy 10/20/2019 - hemorrhoids.  Moderate (Mayo 2) colitis from anal verge up to 10cm.  Mild colitis from 10-20cm, then normal colon.  Normal terminal ileum.    PATH:  Moderately active chronic colitis  - Sigmoidoscopy 03/28/2020 - moderate (Mayo 2) colitis through the extent of the exam (up to the descending colon at least).    PATH:  Severe chronic active colitis with no CMV    Imaging:    - CT 01/28/18: left sided colitis; liver lesion (see below)  - MRI abdomen to evaluate liver lesion 02/09/18 - aforementioned liver lesion felt to be focal fatty infiltration;  6 month repeat recommended    Prior IBD medications (type, dose, duration, response):  [x]  5-ASAs - Mesalamine  - no improvement; possible paradoxical diarrhea  [x]  Oral corticosteroids - prednisone.  Good response with budesonide foam.   []  Intravenous corticosteroids  []  Antibiotics  [x]  Thiopurines: started in Dec 2019  []  Methotrexate  [x]  Anti-TNF therapies - Humira since Sept or Oct 2019. Low level and low titer Ab in Dec 2019. Increased to weekly dosing. Stopped spring 2020 due to non-response despite good drug levels.   []  Anti-Integrin therapies  []  Anti-Interleukin therapies  [x]  Anti-JAK therapies - Xeljanz 10mg  started 06/2018, tapered to 5mg  bid 09/2018  []  Cyclosporine  []  Clinical trial medication  []  Other (Please specify):    Extraintestinal manifestations:   -joint pains affecting: y, peripheral arthralgias  -eye: n  -skin: n  -oral ulcers :  n  -blood clots: n  -PSC: n  -other: n          Past Medical History:   Past medical history:   Past Medical History:   Diagnosis Date    Arthritis     Pneumothorax     Ulcerative colitis (CMS-HCC)      Past surgical history:   Past Surgical History:   Procedure Laterality Date    BACK SURGERY  1996    COLONOSCOPY      PR COLONOSCOPY W/BIOPSY SINGLE/MULTIPLE Left 07/01/2018    Procedure: COLONOSCOPY, FLEXIBLE, PROXIMAL TO SPLENIC FLEXURE; WITH BIOPSY, SINGLE OR MULTIPLE;  Surgeon: [name], MD;  Location: [facility] PROCEDURES [facility];  Service: Gastroenterology    PR COLONOSCOPY W/BIOPSY SINGLE/MULTIPLE  10/20/2019    Procedure: COLONOSCOPY, FLEXIBLE, PROXIMAL TO SPLENIC FLEXURE; WITH BIOPSY, SINGLE OR MULTIPLE;  Surgeon: Luanne Bras, MD;  Location: [facility]ED[facility];  Service: Gastroenterology    PR LAP, SURG ENTEROLYSIS Midline 06/29/2020    Procedure: ROBOTIC XI LAPAROSCOPY, SURGICAL, ENTEROLYSIS (FREEING OF INTESTINAL ADHESION) (SEPARATE PROCEDURE);  Surgeon: [name], MD;  Location: MAIN OR  [facility];  Service: Gastrointestinal    PR LAP, SURG ENTEROLYSIS Midline 12/12/2020    Procedure: ROBOTIC LAPAROSCOPY, SURGICAL, ENTEROLYSIS (FREEING OF INTESTINAL ADHESION) (SEPARATE PROCEDURE);  Surgeon: [name], MD;  Location: MAIN OR [facility];  Service: Gastrointestinal    PR LAP, SURG PROCTECTOMY W COLOSTOMY Midline 12/12/2020    Procedure: ROBOTIC XI LAPAROSCOPY, SURGICAL; PROCTECTOMY, COMPLETE, COMBINED ABDOMINOPERINEAL, WITH COLOSTOMY;  Surgeon: [name], MD;  Location: MAIN OR [place];  Service: Gastrointestinal    PR LAP,SURG,COLECTOMY,TOTAL,W/O PROCTECTOMY N/A 06/29/2020    Procedure: ROBOTIC XI LAPAROSCOPY, SURGICAL; COLECTOMY, TOTAL, ABDOMINAL, WITHOUT PROCTECTOMY, WITH ILEOSTOMY OR ILEOPROCTOSTOMY;  Surgeon: [name], MD;  Location: MAIN OR [facility];  Service: Gastrointestinal    PR RESECT SMALL INTEST,SINGL RESEC/ANAS Midline 12/12/2020    Procedure: ENTERECTOMY SM INTES; SNGL RESECT & ANASTOM;  Surgeon: [name], MD;  Location: MAIN OR ;  Service: Gastrointestinal    PR SIGMOIDOSCOPY,BIOPSY N/A 03/28/2020    Procedure: SIGMOIDOSCOPY, FLEXIBLE; WITH BIOPSY, SINGLE OR MULTIPLE;  Surgeon: Luanne Bras, MD;  Location: [facility] OR [facility];  Service: Gastroenterology     Family history:   Family History   Problem Relation Age of Onset    Cancer Mother      Social history:   Social History Socioeconomic History    Marital status: Married     Spouse name: None    Number of children: None    Years of education: None    Highest education level: None   Tobacco Use    Smoking status: Former     Types: Cigarettes     Quit date: 2006     Years since quitting: 17.8     Passive exposure: Never    Smokeless tobacco: Never   Vaping Use    Vaping Use: Never used   Substance and Sexual Activity    Alcohol use: Not Currently    Drug use: No    Sexual activity: Yes     Partners: Female             Allergies:   No Known Allergies          Medications:     Current Outpatient Medications   Medication Sig Dispense Refill    acetaminophen (TYLENOL) 500 MG tablet Take 2 tablets (1,000 mg total) by mouth every six (6) hours as needed for pain.      buprenorphine 5 mcg/hour PTWK transdermal patch Place 1 patch on the skin every seven (7) days. 4 patch 2    celecoxib (CELEBREX) 200 MG capsule Take 1 capsule (200 mg total) by mouth daily as needed for pain. 90 capsule 1    folic acid (FOLVITE) 1 MG tablet Take 1 tablet (1 mg total) by mouth daily.      melatonin 3 mg Tab Take 1 tablet (3 mg total) by mouth nightly as needed.  0    multivitamin (TAB-A-VITE/THERAGRAN) per tablet Take 1 tablet by mouth daily.      omeprazole (PRILOSEC OTC) 20 MG tablet Take 1 tablet (20 mg total) by mouth daily.      tumeric-ging-olive-oreg-capryl 100 mg-150 mg- 50 mg-150 mg cap Take by mouth.      diphenoxylate-atropine (LOMOTIL) 2.5-0.025 mg per tablet Take 2 tablets by mouth four (4) times a day as needed for diarrhea. 240 tablet 3    pregabalin (LYRICA) 225 MG capsule Take 1 capsule (225 mg total) by mouth Two (2) times a day. (Patient not taking: Reported on 12/25/2021) 60  capsule 2     No current facility-administered medications for this visit.             Physical Exam:   BP 122/71 (BP Site: L Arm, BP Position: Sitting, BP Cuff Size: Medium)  - Pulse 68  - Temp 36.7 ??C (98.1 ??F) (Temporal)  - Ht 174 cm (5' 8.5)  - Wt 78.5 kg (173 lb)  - BMI 25.92 kg/m??     GEN: no apparent distress, appears comfortable on exam  HEENT: OP clear with no erythema, lesions, exudate, mucous membranes moist  NECK: Supple, no lymphadenopathy  LUNGS: CTAB, no wheezes, rales, or rhonchi  CV: S1/S2, RRR, no murmurs  ABD: Soft, nontender, no rebound/guarding, nondistended, normoactive bowel sounds, no appreciable organomegaly  Extremities: no cyanosis, clubbing or edema, normal gait  Psych: affect appropriate, A&O x3  SKIN: no visible lesions on face, neck, arms, abdomen          Labs, Data & Indices:     Lab Review:   Lab Results   Component Value Date    WBC 7.0 09/10/2021    WBC 4.9 02/22/2021    RBC 4.74 02/22/2021    HGB 13.9 09/10/2021    HGB 12.8 (L) 02/22/2021     Lab Results   Component Value Date    AST 21 09/10/2021    ALT 36 09/10/2021    BUN 20 12/22/2020    BUN 14 08/04/2019    Creatinine Whole Blood, POC 1.1 03/16/2021    CO2 25.0 12/22/2020    CO2 24 08/04/2019    Albumin 4.1 11/24/2020    Calcium 10.1 12/22/2020    Calcium 10.0 08/04/2019     No results found for: TSH   ...................................................................................................  ............................................................................................................................................  ORDERS THIS VISIT:       Diagnosis ICD-10-CM Associated Orders   1. Ulcerative colitis with complication, unspecified location (CMS-HCC)  K51.919 CBC w/ Differential     Ambulatory referral to Colorectal Surgery      2. 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 Medication management  Z79.899                 Assessment & Recommendations:   Disease state: ulcerative pancolitis - post-op    Kevin Tran is a 61 y.o. male with ulcerative pancolitis (mostly proctosigmoiditis) since 2018. He was previously treated with Humira (mechanistic loss of response), Xeljanz (non-response) and Stelara (non-response).  He had a severe UC flare in Feb 2022 but had progressive colitis despite starting Infliximab. Hence, we proceeded with surgery (06/29/20) and he is status post colectomy with an end ileostomy.  Unfortunately he had an attempted IPAA surgery in November 2022 but there were intra-operative complications (bleeding, dusky appearance of the pouch) and the pouch creation had to be aborted. 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 After an extensive discussion today with patient and wife, patient would like to proceed with a second surgical opinion at [facility].     Plan:  Increase lomotil from 1 tab BID PRN to 2 tabs QID PRN. Will hold off on adding on imodium for now to simplify medication regimen.   Can consider lidocaine patches to abdominal surgical scar sites, he will message if needed.   Check CBC given reported black stools.   Referral placed to Dr. Peri Jefferson or Dr. Elenore Rota at [facility].     IBD health maintenance:  Influenza vaccine: given in Fall 2019  Pneumonia vaccine:   Hepatitis B: received twinrix 2 of 3;   TB testing: Quant gold neg in March 2019  Chickenpox/Shingles history: Shingrix completed 09/2018  Bone denistometry:   Derm appointment:  Last colonoscopy: 10/2019  --------------------------------------------------------  Author: Jeanann Lewandowsky, MD 12/25/2021 4:56 PM  ===================================================  I saw and evaluated Kevin Tran with GI fellow Dr. Nedra Hai.  I participated in key portions of the service and personally reviewed the plan of care with the patient.  I reviewed the fellow's note and agree with the fellow???s findings and plan.     Additional thoughts are below:  Long discussion with Mr. Pieroni.  He remains understandably reluctant to have additional surgery, but he is also interested in exploring options to not live with an ostomy long term. I did encourage him to seek a second opinion  to discuss surgery. I have offered to refer him to a nationally known IBD surgeon (such as Beatrice Lecher in [place]), but he would prefer not to travel such a distance.  I have contacted Dr. Elenore Rota and [place] and patient will have a consultation to discuss options. I also discussed that if he wants to consider surgery, now would be a reasonable time and I would not wait years to make that decision.     We also discussed his high ostomy output and uptitrating his anti-diarrheal regimen as detailed above.     [name], MD  Assistant Professor of Medicine  Division of Gastroenterology & Hepatology  [place] [facility]  ====================================================

## 2021-12-26 LAB — ANTI-NEUTROPHILIC CYTOPLASMIC ANTIBODY
ANCA IFA: NEGATIVE
MPO-ELISA: NEGATIVE
MPO-QUANT: 2.8 U/mL (ref <21.0)
PR3 ELISA: NEGATIVE
PR3-QUANT: 1.8 U/mL (ref <21.0)

## 2022-01-09 MED ORDER — LOPERAMIDE 2 MG CAPSULE
ORAL_CAPSULE | 1 refills | 0 days
Start: 2022-01-09 — End: ?

## 2022-01-10 MED ORDER — LOPERAMIDE 2 MG CAPSULE
ORAL_CAPSULE | 1 refills | 0 days
Start: 2022-01-10 — End: ?

## 2022-01-15 MED ORDER — BUPRENORPHINE 5 MCG/HOUR WEEKLY TRANSDERMAL PATCH
MEDICATED_PATCH | TRANSDERMAL | 2 refills | 28 days
Start: 2022-01-15 — End: ?

## 2022-01-16 ENCOUNTER — Ambulatory Visit: Admit: 2022-01-16 | Discharge: 2022-01-17 | Payer: PRIVATE HEALTH INSURANCE

## 2022-01-16 DIAGNOSIS — G609 Hereditary and idiopathic neuropathy, unspecified: Principal | ICD-10-CM

## 2022-01-16 DIAGNOSIS — M25532 Pain in left wrist: Principal | ICD-10-CM

## 2022-01-16 DIAGNOSIS — G894 Chronic pain syndrome: Principal | ICD-10-CM

## 2022-01-16 DIAGNOSIS — M25531 Pain in right wrist: Principal | ICD-10-CM

## 2022-01-16 DIAGNOSIS — M2550 Pain in unspecified joint: Principal | ICD-10-CM

## 2022-01-16 MED ORDER — PREGABALIN 225 MG CAPSULE
ORAL_CAPSULE | Freq: Two times a day (BID) | ORAL | 2 refills | 30 days | Status: CP
Start: 2022-01-16 — End: ?

## 2022-01-16 MED ORDER — TRAMADOL 50 MG TABLET
ORAL_TABLET | Freq: Every day | ORAL | 2 refills | 20 days | Status: CP | PRN
Start: 2022-01-16 — End: 2022-01-16

## 2022-01-16 NOTE — Unmapped (Addendum)
Department of Anesthesiology  [facility]  [address], [address]  [place], [name] [address]  [phone]    Chronic Pain Follow Up Note  1. Idiopathic peripheral neuropathy    2. Chronic pain syndrome    3. Multiple joint pain    4. Bilateral wrist pain      Assessment and Plan  Attending: [name] is a 61 y.o. 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 [facility] diffuse arthralgias and abdominal pain related to ostomy output.  He was first seen in April 2023.    The patient was referred to our clinic by General Surgery for reported significant LLQ abdominal pain near surgery site and continued high ostomy output but no other concerns after surgery.      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 His worst pain is related to rectal spasm and polyarthralgias, worst in the wrists (L > R) and bilateral hands. He did have XRAYs of the hands at [facility] in 2022 that were unremarkable.  He was started on lyrica due to initial concern for fibromyalgia and more neuropathic pain and is tolerating that well. He continues to follow with rheumatology for workup of arthritis (negative serologies, unlikely inflammatory arthritis) and is on methotrexate, previously prednisone (for UC prior to November surgery) but no longer on chronic steroids. He has done PT in the past and feels that he is as active (walking) as possible and benefits more from home exercises.     December 2023  At last visit in September, he reported his headache was worse than his joint/extremity pain and thought it was related to the pregabalin dose adjustment. Tried the compounded cream for his hands but stated it did not seem to help. The increased pregabalin dose also did not help with his pain. Stated he was still interested in butrans patch, UDS was negative last time. Given his pain had not improved with various medication trial at this point, reasonable to start the butrans patch for severe, constant, uncontrolled pain. We discussed it also might be worth to follow up with Neurology for further evaluation of his hand/wrist pain given it's burning, possibly neuropathic in nature, especially given rheumatology did not think it was inflammatory at this point. While he had a negative EMG/NCS, it could still be small fiber peripheral neuropathy.  We reviewed red flag symptoms of his headache and encouraged him to reach out to his PCP if his headache did not get better or got worse for further evaluation in case it was not related to the pregabalin at all.    Today, the patient returns reporting unchanged pain since last visit. He has recently discontinued Butrans due to drowsiness and nausea with this medication. He reports that he did not apply his new patch recently when it was time to change it and noted an improvement in side effects. He states he may have had minimal pain benefit from patch initiation. He inquires about something he can use on a more as-needed basis as he is concerned about taking pain medications and being able to work. We will  initiate tramadol #20 tablets per month to be used as-needed for pain control. The patient will need to schedule with pain psychology for evaluation of appropriateness for opioid management and to assist with pain coping strategies.    Urine toxicology: 07/31/21  Urine toxicology screen appropriate  Last Opioid Change: Start butrans 10/2021, 01/2022-stop butrans, start tramadol  Opioid agreement-10/2021  Last EKG:  None  Previous Compliance Issues: None  Naloxone ordered: N/A  Total morphine equivalents: 5  Benzodiazepine: No.  Pain Psychology: New referral order placed today for both COM and pain coping skills, CBT, other treatments as appropriate  [place] DOC: None  [place] database was reviewed 01/16/22.      Peripheral neuropathy of hands  - Underwent EMG, which was normal  - Not interested in scheduling biopsy for SFPN evaluation    Polyarthritis of unknown etiology - Bilateral Wrist Pain   - Continue following with Rheumatology    Chronic pain syndrome  - Stop butrans patch  - Start Tramadol 50 mg daily PRN #20 tablets monthly  - Continue Lyrica 225 mg BID  - Continue home PT exercises and activity (enjoys walking)    Future Considerations:  - Right lumbar MBBs  - Consider muscle relaxant for rectal spasm    Return in about 3 months (around 04/17/2022).    Orders Placed This Encounter   Procedures    Ambulatory referral to Pain Psychology     Standing Status:   Future     Standing Expiration Date:   01/17/2023     Referral Priority:   Routine     Referral Type:   Generic Referral     Number of Visits Requested:   1     Requested Prescriptions     Signed Prescriptions Disp Refills    pregabalin (LYRICA) 225 MG capsule 60 capsule 2     Sig: Take 1 capsule (225 mg total) by mouth two (2) times a day.    traMADoL (ULTRAM) 50 mg tablet 20 tablet 2     Sig: Take 1 tablet (50 mg total) by mouth daily as needed for pain.     HPI:  Kevin Tran is seen in consultation at the request of [name], MD  For evaluation and recommendations regarding His chronic pain.     Patient was last seen in September. Since then, the patient has followed with Rheumatology, Neurology, and GI.     Today, patient reports unchanged pain. He has had improvement in side effects since stopping Butrans. He was concerned about his ability to operate heavy machinery while on this medication as it did cause significant drowsiness. He also expresses concern that daily use of any scheduled opioid may lead to the same issue. He states he would prefer if he is able to utilize the medication as needed, typically before bed. He specifically inquires about hydrocodone which has helped in the past. He endorses some benefit with use of Lyrica 225 mg twice daily. He recently underwent EMG, which was normal. He is not interested in pursuing biopsy for evaluation of small fiber neuropathy.    Current Medications Regime:  Lyrica 225mg  BID  Butrans-stopped    The patient states his pain is located head, neck, shoulders, bilateral hands, right foot and the severity of his pain ranges from 4/10 to 6/10.  His pain currently is 6/10 and on average is 6/10. He describes the sensation of his pain as aching, burning, sharp, shooting. His pain is present all of the  time and worst mornings. The patient???s pain impacts enjoyment of life, mood, normal work. His interval history includes None. His pain has stayed the same, and he does not have new pain to discuss today. He is not on blood thinners or anti-coagulants. In regards to medications currently taken for pain management, the patient is tolerating these medications well and complains of associated side effects: dry mouth, nausea/vomiting.    The treatment goals include Complete resolution with medications if necessary, Ability to return to some type of employment, and Ability to return to previous daily routine and activity    Previous Medication Trials: Diclofenac gel/voltaren gel, duloxetine/cymbalta (side effects), gabapentin, methocarbamol/robaxin, Naproxen/Aleve, and Pregabalin/Lyrica, compounded topical (not helpful), butrans (side effects), tramadol     Prior interventions include medications, PT.   Referral to neurology 10/2021  Pain psychology referral 01/2022    Prior imaging include MRI, XR, CT, Nerve Conduction Study.     The treatment goals include Complete resolution with medications if necessary, Ability to return to some type of employment, and Ability to return to previous daily routine and activity     Allergies as of 01/16/2022    (No Known Allergies)      Current Outpatient Medications   Medication Sig Dispense Refill    acetaminophen (TYLENOL) 500 MG tablet Take 2 tablets (1,000 mg total) by mouth every six (6) hours as needed for pain.      celecoxib (CELEBREX) 200 MG capsule Take 1 capsule (200 mg total) by mouth daily as needed for pain. 90 capsule 1    diphenoxylate-atropine (LOMOTIL) 2.5-0.025 mg per tablet Take 2 tablets by mouth four (4) times a day as needed for diarrhea. 240 tablet 3    folic acid (FOLVITE) 1 MG tablet Take 1 tablet (1 mg total) by mouth daily.      melatonin 3 mg Tab Take 1 tablet (3 mg total) by mouth nightly as needed.  0    multivitamin (TAB-A-VITE/THERAGRAN) per tablet Take 1 tablet by mouth daily.      omeprazole (PRILOSEC OTC) 20 MG tablet Take 1 tablet (20 mg total) by mouth daily.      tumeric-ging-olive-oreg-capryl 100 mg-150 mg- 50 mg-150 mg cap Take by mouth.      pregabalin (LYRICA) 225 MG capsule Take 1 capsule (225 mg total) by mouth two (2) times a day. 60 capsule 2    traMADoL (ULTRAM) 50 mg tablet Take 1 tablet (50 mg total) by mouth daily as needed for pain. 20 tablet 2     No current facility-administered medications for this visit.     Imaging/Tests:   XR SIJ 03/22/21  FINDINGS:   Symmetric sacroiliac joints. Mild periarticular sclerosis, similar to the prior CT pelvis 03/16/2021 and likely degenerative. No erosions. Mild arthrosis at the symphysis pubis. Visualized joints are preserved.   IMPRESSION  No radiographic findings of inflammatory arthropathy.     CT Pelvis 03/16/21  FINDINGS:       BLADDER: Partially distended, otherwise unremarkable.       PELVIC/REPRODUCTIVE ORGANS: Borderline enlarged prostate, measuring up to 4.8 cm.       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 The rectal stump is unremarkable.       PERITONEUM/RETROPERITONEUM AND MESENTERY: No free air. Mild interval decrease in previously described small volume of loculated fluid in the dependent pelvis/presacral space (5:67).       LYMPH NODES: No enlarged lymph nodes.  VESSELS: Scattered calcified and noncalcified atheromatous plaques of the abdominal aorta and its branch vessels, which appear normal in caliber.       BONES AND SOFT TISSUES: Multilevel degenerative disc disease. No suspicious osseous lesions. Postsurgical changes of the anterior abdominal wall. Question tiny soft tissue tract extending to the left perianal soft tissues at approximately the 5:00 position (2:53). No drainable perianal fluid collection.   IMPRESSION  -- Sequelae of prior total colectomy with left lower quadrant end ileostomy. Mild interval decrease in previously described small volume partially loculated fluid in the dependent pelvis/presacral space.   --Tiny soft tissue tract extending through the left perianal soft tissues at approximately the 5:00 position, which may represent an old scarred down perianal fistulous tract. No drainable perirenal fluid collection.     CT Abdomen 01/21/21  FINDINGS:       LOWER CHEST: Redemonstrated right lower lobe subsegmental atelectasis versus scarring. Left-sided gravity dependent atelectasis. Otherwise unremarkable.       LIVER: Normal liver contour.  No focal liver lesions. Small volume focal fat along the falciform and gallbladder fossa, unchanged from 03/27/20.       BILIARY: No biliary ductal dilatation.  The gallbladder is physiologically distended and normal in appearance.       SPLEEN: Normal in size and contour.       PANCREAS: Normal pancreatic contour.  No focal lesions.  No ductal dilation.       ADRENAL GLANDS: Normal appearance of the adrenal glands.       KIDNEYS/URETERS: Symmetric renal enhancement.  No hydronephrosis.  No nephrolithiasis.       BLADDER: Physiologically distended and otherwise unremarkable.       REPRODUCTIVE ORGANS: Unremarkable.       GI TRACT: Postsurgical sequela of total colectomy with right lower quadrant ileostomy. Contrast opacifies the stomach and majority of the small bowel, but does not extend to the ileostomy site, likely due to contrast timing. There is also absence of contrast opacification in the right lower quadrant, although there are no abnormally dilated loops of proximal small bowel, and this is likely secondary to contrast timing. Rectal stump decompressed. No findings of acute inflammation.  Surgically absent appendix.       PERITONEUM, RETROPERITONEUM AND MESENTERY: There is mild swirling of the mesentery in the right lower quadrant, likely post-operative. No free air.  No ascites.  Small volume of simple free fluid in the dependent pelvis, likely normal postsurgical sequela.       LYMPH NODES: No adenopathy.       VESSELS: Hepatic and portal veins are patent.  Normal caliber aorta.  Moderate scattered atherosclerotic calcifications of the abdominal aorta and its branching vessels. Accessory left renal vein arising from the inferior pole.       BONES and SOFT TISSUES: Right lower quadrant ileostomy changes and additional anterior abdominal wall postsurgical changes. No focal rim-enhancing fluid collection. No aggressive osseous lesions.  No focal soft tissue lesions. Multilevel degenerative changes of the visualized spine, most severe at L5-S1 with loss of intervertebral disc space and vacuum disc phenomenon.   IMPRESSION  Sequela of interval total colectomy with right lower quadrant ileostomy. Loculated fluid is identified in the presacral region, however, there is no evidence of discrete wall enhancement to suggest abscess formation at this time.       Additional chronic and incidental findings as above.     XRAY hand results in care everywhere 10/2020    Lab Results   Component  Value Date    CREATININE 1.1 03/16/2021     Lab Results   Component Value Date    ALKPHOS 67 11/24/2020    BILITOT 0.7 11/24/2020    BILIDIR 0.10 04/30/2018    PROT 7.5 11/24/2020    ALBUMIN 4.1 11/24/2020    ALT 36 09/10/2021    AST 21 09/10/2021     Lab Results   Component Value Date    PLT 265 09/10/2021     Urine toxiciology screen  No results found for: AMPHU, BARBU, BENZU, CANNAU, METHU, OPIAU, COCAU    OPIOID CONFIRMATION:  Lab Results   Component Value Date    C difficile Toxins A+B, EIA Negative 06/09/2018      BENZODIAZEPINE CONFIRMATION:  Lab Results   Component Value Date    C difficile Toxins A+B, EIA Negative 06/09/2018     Review of Systems:  General none  Cardiovascular none  Gastrointestinal nausea and stomach pain  Skin none  Endocrine none  Musculoskeletal back pain  Neurologic migraines/headaches  Psychiatric none      He denies any homicidal or suicidal ideation.     OBJECTIVE  PHYSICAL EXAM:  BP 122/81  - Pulse 78  - Temp 36.6 ??C (97.9 ??F) (Skin)  - Resp 16  - Ht 172.7 cm (5' 8)  - Wt 77.6 kg (171 lb)  - SpO2 97%  - BMI 26.00 kg/m??   Wt Readings from Last 3 Encounters:   01/16/22 77.6 kg (171 lb)   12/25/21 78.5 kg (173 lb)   12/24/21 78 kg (172 lb)     GENERAL:  The patient is well developed, well-nourished, and appears to be in no apparent distress.   HEAD/NECK:    Normocephalic/atraumatic. clear sclera, pupils not pinpoint  CV:  RR  LUNGS:   Normal work of breathing, no supplemental O2  EXTREMITIES:  No clubbing, cyanosis noted.  NEUROLOGIC:    The patient is alert and oriented, speech fluent, normal language.   MUSCULOSKELETAL:    Motor function  preserved. Good range of motion of all extremities.   GAIT:  The patient rises from a seated position with no difficulty and ambulates with nonantalgic gait without the assistance of a walking aid.   SKIN:   No obvious rashes, lesions, or erythema.  PSY:   Appropriate affect. No overt pain behaviors. No evidence of psychomotor retardation or agitation, no signs of intoxication.     I personally spent 37 minutes face-to-face and non-face-to-face in the care of this patient, which includes all pre, intra, and post visit time on the date of service.  All documented time was specific to the E/M visit and does not include any procedures that may have been performed.

## 2022-01-16 NOTE — Unmapped (Signed)
Medication:  Buprenorphine 41mcg/HR TD Patch  SIG:  Apply 1 patch topically to the skin every 7 days  Quantity on RX:  # 4  Filled on:  12/20/21  Pill count today:  # 2    Patient states he quit wearing patches as of 01/06/22  States they made him groggy and sick

## 2022-01-16 NOTE — Unmapped (Addendum)
It was good to see you today.    Start Tramadol; take 1 tablet daily as needed for pain.    We will have you see our Pain Psychology team.    We will see you in 3 months, or sooner if needed.

## 2022-01-30 ENCOUNTER — Ambulatory Visit: Admit: 2022-01-30 | Discharge: 2022-01-31 | Payer: PRIVATE HEALTH INSURANCE | Attending: Surgery | Primary: Surgery

## 2022-01-30 DIAGNOSIS — K51919 Ulcerative colitis, unspecified with unspecified complications: Principal | ICD-10-CM

## 2022-01-30 DIAGNOSIS — K9413 Enterostomy malfunction: Principal | ICD-10-CM

## 2022-01-30 MED ORDER — LOPERAMIDE 2 MG TABLET
ORAL_TABLET | Freq: Two times a day (BID) | ORAL | 3 refills | 15 days | Status: CP | PRN
Start: 2022-01-30 — End: 2023-01-30

## 2022-01-30 NOTE — Unmapped (Signed)
Received phone call from pt's wife about pt experiencing severe headache while on lomotil. Discussed with Dr. Raphael Gibney and will have pt discontinue lomotil and start imodium. Called pt's wife and instructed her that pt should take 1-2 tablets BID. Script sent to preferred pharmacy.

## 2022-01-30 NOTE — Unmapped (Signed)
Patient Name: Kevin Tran  Patient Age: 61 y.o.  Encounter Date: 01/30/2022    REFERRING PHYSICIAN:  [name], MD  [address]  [id number]  [place],  [name] [address]    CONSULTING PHYSICIANS:  Patient Care Team:  Chriss Czar, [place] as PCP - General (Family Medicine)  Chriss Czar, [place] as PCP - Rande Brunt (Family Medicine)  Dorris Fetch, RN as WOCN  (Wound Care)    PRIMARY CARE PROVIDER:  Chriss Czar, [place]      ASSESSMENT:     1.  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  Retracted ileostomy with peristomal skin scarring        PLAN: We discussed his case.  Based on everything he shared with me his biggest concern/quality of life impairment was difficulty with his ileostomy.  Certainly, he is not happy that he is still in an ileostomy but he remembers May 2022 through November 2022 being very manageable.  That ostomy did not give him any troubles.  In light of this important history we did discuss 2 options.  The first would be revision of his ileostomy to see if we can improve it in such a manner that he would be content continuing to live with an ileostomy.  This is attractive to him because he is very reticent to undergo another major operation it and he prioritizes quality life at this point.  The second option is an attempt at redoing his ileostomy.  I explained that the challenges to a redo pouch for the following 1) achieving mesenteric reach status post formation and excision of pouch at his last operation 2) getting adequate access to the short anorectal stump  3) functional result after redo J-pouch knowing that he lost about 40 cm of ileum with the construction and excision of J-pouch after failed attempt November 2022.   Based on this discussion he would prefer to have his ileostomy revised and see if that improves his quality of life sufficiently.  Doing this does not preclude a future attempt at redo J-pouch.  Given everything that he shared with me today I think he is making the best choice for him.    Regarding the specifics of redoing the ileostomy.  I took off his back today and looked at it.  Today, it is sitting up half a centimeter.  It seems like it is manageable the way it sits today but other times and it retracts underneath the skin.  The surrounding skin has scarring which makes pouching it more difficult.  The goal at the time of her revision would be to try to elevate it and eliminate the subcutaneous scarring which I think pulls the stoma down leading to intermittent retraction.  If I am unable to get the ileostomy to sit in a way that I think will do well for him I could relocate it to a portion of skin above his current ileostomy or over to the left side.  I prefer to try to manage this locally however.    He is going to sit with the schedulers today to look at surgical dates.    ______________________________________      REASON FOR VISIT:   end ileostomy    HISTORY OF PRESENT ILLNESS:    Kevin Tran is a 61 y.o. male who is seen in consultation at  the request of [name], MD for end ileostomy    Patient has medically refractory ulcerative colitis and came to surgical attention May 2022.  At that time Dr. Miachel Roux performed a total abdominal colectomy with end ileostomy.  He recovered okay from that.  For the next 6 months he remembers doing quite well.  He no longer had troubles with abdominal pain.  In November 2022 he underwent completion proctectomy and J-pouch.  Unfortunately, at this operation Dr.Guillem describes an ischemic pouch after performing it.  He decided to excise the pouch and go back to an end ileostomy.  The rectum and already been removed.  Since, the patient has had lots of troubles.  He has a hard time keeping a pouch on his ileostomy and the output is much more liquidy than it was before.  After his first operation, between May 2022 and the second operation November 2022 the patient recalls doing quite well with his ileostomy.  It was more often thick and he did not have troubles maintaining his seal.  He has been frustrated by all this. Dr. Miachel Roux is not offering a redo J-pouch.  Dr. Raphael Gibney has been talking to him about a second opinion.  The patient is here today for this though he is reluctant to have any more surgery.      REVIEW OF SYSTEMS:   Denies fevers, chills, sweats, nausea, vomiting, abdominal pain, chest pain, shortness of breath, congestion, cough, heart palpitations, weight loss, change in bowel habits, melena or hematochezia. Denies lower extremity edema, headaches, visual changes.  Denies dysuria, hematuria or urinary incontinence. Denies focal bony pain.  Remainder of a 10 system review of systems is negative.     ALLERGIES:  has No Known Allergies.    MEDICATIONS:  Current Outpatient Medications   Medication Sig Dispense Refill    acetaminophen (TYLENOL) 500 MG tablet Take 2 tablets (1,000 mg total) by mouth every six (6) hours as needed for pain.      celecoxib (CELEBREX) 200 MG capsule Take 1 capsule (200 mg total) by mouth daily as needed for pain. 90 capsule 1    folic acid (FOLVITE) 1 MG tablet Take 1 tablet (1 mg total) by mouth daily.      loperamide (IMODIUM A-D) 2 mg tablet Take 2 tablets (4 mg total) by mouth two (2) times a day as needed for diarrhea. 60 tablet 3    multivitamin (TAB-A-VITE/THERAGRAN) per tablet Take 1 tablet by mouth daily.      omeprazole (PRILOSEC OTC) 20 MG tablet Take 1 tablet (20 mg total) by mouth daily.      pregabalin (LYRICA) 225 MG capsule Take 1 capsule (225 mg total) by mouth two (2) times a day. 60 capsule 2    traMADoL (ULTRAM) 50 mg tablet Take 1 tablet (50 mg total) by mouth daily as needed for pain. 20 tablet 2    tumeric-ging-olive-oreg-capryl 100 mg-150 mg- 50 mg-150 mg cap Take by mouth.       No current facility-administered medications for this visit.       MEDICAL HISTORY:  Past Medical History:   Diagnosis Date    Arthritis     Pneumothorax     Ulcerative colitis (CMS-HCC)        SURGICAL HISTORY:  Past Surgical History:   Procedure Laterality Date    BACK SURGERY  1996    COLON SURGERY      COLONOSCOPY      LUNG SURGERY  PR COLONOSCOPY W/BIOPSY SINGLE/MULTIPLE Left 07/01/2018    Procedure: COLONOSCOPY, FLEXIBLE, PROXIMAL TO SPLENIC FLEXURE; WITH BIOPSY, SINGLE OR MULTIPLE;  Surgeon: [name], MD;  Location: [facility];  Service: Gastroenterology    PR COLONOSCOPY W/BIOPSY SINGLE/MULTIPLE  10/20/2019    Procedure: COLONOSCOPY, FLEXIBLE, PROXIMAL TO SPLENIC FLEXURE; WITH BIOPSY, SINGLE OR MULTIPLE;  Surgeon: Luanne Bras, MD;  Location: [facility];  Service: Gastroenterology    PR LAP, SURG ENTEROLYSIS Midline 06/29/2020    Procedure: ROBOTIC XI LAPAROSCOPY, SURGICAL, ENTEROLYSIS (FREEING OF INTESTINAL ADHESION) (SEPARATE PROCEDURE);  Surgeon: [name], MD;  Location: MAIN OR [facility];  Service: Gastrointestinal    PR LAP, SURG ENTEROLYSIS Midline 12/12/2020    Procedure: ROBOTIC LAPAROSCOPY, SURGICAL, ENTEROLYSIS (FREEING OF INTESTINAL ADHESION) (SEPARATE PROCEDURE);  Surgeon: [name], MD;  Location: MAIN OR [facility];  Service: Gastrointestinal    PR LAP, SURG PROCTECTOMY W COLOSTOMY Midline 12/12/2020    Procedure: ROBOTIC XI LAPAROSCOPY, SURGICAL; PROCTECTOMY, COMPLETE, COMBINED ABDOMINOPERINEAL, WITH COLOSTOMY;  Surgeon: [name], MD;  Location: MAIN OR [place];  Service: Gastrointestinal    PR LAP,SURG,COLECTOMY,TOTAL,W/O PROCTECTOMY N/A 06/29/2020    Procedure: ROBOTIC XI LAPAROSCOPY, SURGICAL; COLECTOMY, TOTAL, ABDOMINAL, WITHOUT PROCTECTOMY, WITH ILEOSTOMY OR ILEOPROCTOSTOMY;  Surgeon: [name], MD;  Location: MAIN OR [facility];  Service: Gastrointestinal    PR RESECT SMALL INTEST,SINGL RESEC/ANAS Midline 12/12/2020    Procedure: ENTERECTOMY SM INTES; SNGL RESECT & ANASTOM;  Surgeon: [name], MD;  Location: MAIN OR [facility];  Service: Gastrointestinal    PR SIGMOIDOSCOPY,BIOPSY N/A 03/28/2020    Procedure: SIGMOIDOSCOPY, FLEXIBLE; WITH BIOPSY, SINGLE OR MULTIPLE;  Surgeon: Luanne Bras, MD;  Location: [facility] OR [facility];  Service: Gastroenterology       SOCIAL HISTORY:  wife with him.  Tobacco use:  reports that he quit smoking about 17 years ago. His smoking use included cigarettes. He has never been exposed to tobacco smoke. He has never used smokeless tobacco.  Alcohol use:  reports current alcohol use.  Drug use:  reports no history of drug use.    FAMILY HISTORY:  family history includes Cancer in his mother.        Objective :    Vital Signs for this encounter:  BSA: 1.93 meters squared  BP 134/84  - Pulse 83  - Ht 172.7 cm (5' 8)  - Wt 77.6 kg (171 lb)  - BMI 26.00 kg/m??     General Appearance:  No acute distress, well appearing and well nourished.   HEENT:  Normocephalic, atraumatic, anicteric sclera   Abdomen:          Heart:  Lungs: Soft, stoma RLQ with about half a centimeter of elevation.  The skin around it is scarred.  It looks like the ostomy site was opened at the time of his November operation and closing and is led to some peristomal skin D formation  Rate within normal limits  Non-labored breathing   Musculoskeletal: Extremities without clubbing, cyanosis, or edema.   Skin: Skin color, texture, turgor normal, no rashes or lesions.   Neurologic: No motor abnormalities noted.  Sensation grossly intact.   Rectal: Perianal skin is normal.  Digital rectal exam reveals an anal rectal stump of a few centimeters.  It is soft.  The pelvis feels soft         DIAGNOSTIC STUDIES:     1.  CT scan December 2022 reviewed by me: s/post attempted J-pouch shows a low  anorectal stump with a small amount of free fluid in the pelvis    2.  CT scan February 2023 reviewed by me: Similar findings.  There is still a bit of fluid in the pelvis.  The prostate is fairly large

## 2022-02-12 DIAGNOSIS — K9413 Enterostomy malfunction: Principal | ICD-10-CM

## 2022-02-15 MED ORDER — DIAZEPAM 5 MG TABLET
ORAL_TABLET | 0 refills | 0 days | Status: CP
Start: 2022-02-15 — End: ?

## 2022-02-15 NOTE — Unmapped (Signed)
Patient's spouse left message on RN line requesting sedation added to the MRI cervical spine order.    RN called back and talked to the patient. He said that he was very claustrophobic, did not think he could get through the procedure without something to help. He said he could either try premedication or partial sedation.    RN forwarded request to Dr. Delbert Phenix. Patient voiced understanding.

## 2022-02-18 DIAGNOSIS — K51919 Ulcerative colitis, unspecified with unspecified complications: Principal | ICD-10-CM

## 2022-02-18 DIAGNOSIS — G950 Syringomyelia and syringobulbia: Principal | ICD-10-CM

## 2022-02-18 NOTE — Unmapped (Signed)
error 

## 2022-02-19 NOTE — Unmapped (Addendum)
RN called back patient to let him know that Dr. Delbert Phenix reordered MRI with sedation in the comments, shared Radiology phone number. RN said that MD also sent diazepam prescription to take prior to MRI.  Patient voiced understanding, said he would call Radiology to schedule.

## 2022-03-05 ENCOUNTER — Ambulatory Visit
Admit: 2022-03-05 | Discharge: 2022-03-06 | Payer: PRIVATE HEALTH INSURANCE | Attending: Student in an Organized Health Care Education/Training Program | Primary: Student in an Organized Health Care Education/Training Program

## 2022-03-05 NOTE — Unmapped (Signed)
Otolaryngology New Consultation Visit Note       Reason for Visit:  Cerumen impaction and hearing loss    History of Present Illness    Kevin Tran is 62 y.o. male being seen in consultation at the request of Chriss Czar, [place]  for evaluation of cerumen impaction and hearing loss.  History obtained from the patient, referring provider, and chart review.  Approximately 2 years ago, the patient was evaluated for asymmetric hearing loss, left worse than right.  His audiometry demonstrated this asymmetric hearing loss, and he was referred for an MRI.  However, his insurance changed shortly after this referral, and he also needs to undergo treatment for colonic polyps.  He thus never underwent the MRI.  He still has concerns of asymmetric hearing loss with left worse than right.  His tinnitus is also on the left, no tenderness on the right.  Endorses aural fullness on the left but no otalgia or otorrhea bilaterally.  No vertigo.  He has not had a hearing test in the last 2 years.  No prior ear surgeries, has not worn hearing aids.    ROS intake sheet reviewed- please see above for related positives.     Past Medical History     has a past medical history of Arthritis, Pneumothorax, and Ulcerative colitis (CMS-HCC).    Past Surgical History     has a past surgical history that includes Back surgery (1996); Colonoscopy; pr colonoscopy w/biopsy single/multiple (Left, 07/01/2018); pr colonoscopy w/biopsy single/multiple (10/20/2019); pr sigmoidoscopy,biopsy (N/A, 03/28/2020); pr lap,surg,colectomy,total,w/o proctectomy (N/A, 06/29/2020); pr lap, surg enterolysis (Midline, 06/29/2020); pr lap, surg proctectomy w colostomy ([facility], 12/12/2020); pr lap, surg enterolysis ([facility], 12/12/2020); pr resect small intest,singl resec/anas ([facility]line, 12/12/2020); Colon surgery; and Lung surgery.    Current Medications    Current Outpatient Medications   Medication Sig Dispense Refill    acetaminophen (TYLENOL) 500 MG tablet Take 2 tablets (1,000 mg total) by mouth every six (6) hours as needed for pain.      celecoxib (CELEBREX) 200 MG capsule Take 1 capsule (200 mg total) by mouth daily as needed for pain. 90 capsule 1    diazePAM (VALIUM) 5 MG tablet Two tablets 30 minutes prior to MRI 4 tablet 0    folic acid (FOLVITE) 1 MG tablet Take 1 tablet (1 mg total) by mouth daily.      loperamide (IMODIUM A-D) 2 mg tablet Take 2 tablets (4 mg total) by mouth two (2) times a day as needed for diarrhea. 60 tablet 3    multivitamin (TAB-A-VITE/THERAGRAN) per tablet Take 1 tablet by mouth daily.      omeprazole (PRILOSEC OTC) 20 MG tablet Take 1 tablet (20 mg total) by mouth daily.      pregabalin (LYRICA) 225 MG capsule Take 1 capsule (225 mg total) by mouth two (2) times a day. 60 capsule 2    traMADoL (ULTRAM) 50 mg tablet Take 1 tablet (50 mg total) by mouth daily as needed for pain. 20 tablet 2    tumeric-ging-olive-oreg-capryl 100 mg-150 mg- 50 mg-150 mg cap Take by mouth.       No current facility-administered medications for this visit.       Allergies    No Known Allergies    Family History    family history includes Cancer in his mother.    Social History:     reports that he quit smoking about 18 years ago. His smoking use included cigarettes. He has never  been exposed to tobacco smoke. He has never used smokeless tobacco.   reports current alcohol use.   reports no history of drug use.    Review of Systems    A 12 system review of systems was performed and is negative other than that noted in the history of present illness.    Vital Signs  There were no vitals taken for this visit.    Physical Exam    General: Well-developed, well-nourished. Appropriate, comfortable, and in no apparent distress.  Voice: clear   Head/Face: On external examination there is no obvious asymmetry or scars. On palpation there is no masses within the salivary glands. Cranial nerves V and VII are intact through all distributions.  Eyes: PERRL, EOMI, the conjunctiva are not injected and sclera is non-icteric.  Ears:   Right ear: On external exam, there is no obvious lesions or asymmetry. Cerumen removed from the [facility] -see procedure note. No lesions in the EAC. The TM is in the neutral position and mobile to pneumatic otoscopy. No middle ear masses or fluid noted.   Hearing is grossly intact.  Left ear: On external exam, there is no obvious lesions or asymmetry. Cerumen removed from the [facility] -see procedure note. No lesions in the EAC. The TM is in the neutral position and mobile to pneumatic otoscopy. No middle ear masses or fluid noted.   Hearing is grossly intact.  Nose: No external masses, lesions, or deformity. Inspection of nasal mucosa, septum, and turbinates is normal to anterior rhinoscopy.   Oral cavity/oropharynx: The mucosa of the lips, gums, hard and soft palate, posterior pharyngeal wall, tongue, floor of mouth, and buccal region are without masses or lesions and are normally hydrated. Good dentition. Tongue protrudes midline. Tonsils are symmetric without any masses or lesions. Supraglottis not visualized due to gag reflex.  Neck: There is no asymmetry or masses. Trachea is midline. There is no enlargement of the thyroid or palpable thyroid nodules.   Lymphatics: There is no palpable lymphadenopathy along the jugulodiagastric, submental, or posterior cervical chains.  Chest: No audible wheeze, unlabored respirations.  Neurologic: Cranial nerve???s II-XII are grossly intact. Exam is non-focal.  Extremities: No cyanosis, clubbing or edema.    Procedures:  Procedure: Bilateral cerumen Disimpaction   Diagnosis: Cerumen impaction, bilateral external auditory canal.  Procedure Detail: Using alligator forceps and suction with the use of an otoscope, cerumen impaction was removed from bilateral EACs. The tympanic membrane was visualized and revealed the exam noted above. The patient tolerated the procedure well.       Audiogram  Prior audiograms not available, no audiogram today.    Imaging  No recent head or neck imaging.    Outside Medical Records  I personally reviewed patient's medical records from the referring provider.     Assessment/Recommendations:    The patient is a 62 y.o. male with a history of  has a past medical history of Arthritis, Pneumothorax, and Ulcerative colitis (CMS-HCC). who presents for the evaluation of cerumen impaction and hearing loss. Hearing loss improved somewhat after cerumen removal. Cerumen was removed from  Both ears today. Discussed with patient to avoid q-tips in the ears. For recurrent ear wax, patient should use Debrox drops,3-4 for drops in each ear for 7 days every 3 to 4 months.     Patient also reports history of asymmetric hearing loss, left worse than right.  Was referred for an MRI brain but was not able to have this completed.  He is undergoing  MRI spine under sedation on 1/31.  I will refer the patient to audiology today for urgent audiometry.  If asymmetric hearing loss is still present, we will proceed with MRI brain cranial nerve VIII protocol with and without contrast.  We will see if this can be scheduled at the same time of his MRI cervical spine so that he will not need to undergo light sedation twice.  I will reach out to him following his audiology visit and MRI brain and have him follow-up either with me or one of our neurotology colleagues depending on the results.    The patient voiced complete understanding of plan as detailed above and is in full agreement.      Manning Charity, MD, MPH  Department of Otolaryngology/Head & Neck Surgery  Resident Physician, PGY-4  Pager [phone]

## 2022-03-07 ENCOUNTER — Institutional Professional Consult (permissible substitution)
Admit: 2022-03-07 | Discharge: 2022-03-08 | Payer: PRIVATE HEALTH INSURANCE | Attending: Audiologist | Primary: Audiologist

## 2022-03-07 NOTE — Unmapped (Signed)
[facility]  Audiology at [facility]    AUDIOLOGIC EVALUATION REPORT     Patient: Kevin Tran, Kevin Tran  MRN: [id number]  DOB: 12-Nov-1960  DATE OF EVALUATION: 03/07/2022    PLEASE SEE AUDIOGRAM INCLUDING FULL REPORT IN MEDIA MANAGER TAB.      HISTORY     Kevin Tran is a 62 y.o. individual seen by Audiology for a hearing evaluation as part of  Dr. Perlie Gold 's ENT clinic. His medical history is significant for asymmetric hearing loss. Today, patient reports concern for left ear tumor due to unilateral tinnitus and poorer hearing on the left. He denies history of ear surgery. He reports vertigo/dizziness when standing quickly. 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     RESULTS     Otoscopy revealed:  Right Ear: clear external auditory canal  Left Ear: clear external auditory canal    Tympanometry using a 226 Hz probe tone was consistent with:  Right Ear: Type Ad tympanogram, consistent with normal middle ear pressure and volume and hypercompliance of middle ear  Left Ear: Type Ad tympanogram, consistent with normal middle ear pressure and volume and hypercompliance of middle ear    Today's behavioral evaluation was completed using conventional audiometry via insert earphones with good reliability.     Right Ear:  Mild to severe sensorineural hearing loss 125-8000Hz  with an [place] of normal hearing thresholds noted at 750-1500Hz   Speech Recognition Threshold (SRT): 30 dB HL  Word Recognition Testing: 100% at 70 dB HL using recorded NU-6 word list.    Left Ear:  Normal to mild sensorineural hearing loss 125Hz  - 2000Hz  sloping to a severe sensorineural hearing loss 3000-8000Hz    Speech Recognition Threshold (SRT): 25 dB HL  Word Recognition Testing: 76% at 80 dB HL using recorded NU-6 word list.    Patient was counseled on today's results and expressed understanding.         RECOMMENDATIONS     ENT - saw Dr. Benita Gutter on 03/05/22. Will message with today's results.   Re-evaluate hearing per ENT request, sooner should concerns arise  Pending patient motivation and medical clearance, consider hearing aid consult with [facility] Adult Audiology team    Janace Hoard, Au.D., CCC-A     Provider wore appropriate PPE for the entirety of today's appointment.    Charges associated with this visit:  HC TYMPANOMETRY  HC COMP AUDIO EVAL & SPEECH RECOG

## 2022-03-13 ENCOUNTER — Ambulatory Visit: Admit: 2022-03-13 | Discharge: 2022-03-14 | Payer: PRIVATE HEALTH INSURANCE

## 2022-03-13 MED ADMIN — gadobenate dimeglumine (MULTIHANCE) 529 mg/mL (0.1mmol/0.2mL) solution 16 mL: 16 mL | INTRAVENOUS | @ 15:00:00 | Stop: 2022-03-13

## 2022-03-25 NOTE — Unmapped (Signed)
Received VM from pt requesting jury duty excuse letter. Letter routed to pt via mychart and emailed to [email or web address] per pt request.

## 2022-03-28 NOTE — Unmapped (Unsigned)
IMPRESSION:      Kevin Tran is a 62 y.o. male with known ulcerative colitis, unresponsive to multiple immunosuppressive agents, that eventually required colectomy.   This was preceded by musculoskeletal pain involving both shoulders and eventually wrists and hands with onset 2 years ago (prior to surgery). The patient has never experienced swelling or warmth of joints and describes the quality of the pain is a burning sensation of the shoulders,wrists and hands. Moreover, when he took anti-TNF's, Kevin Tran and more recently methotrexate 25 mg weekly, he did not experience great relief of symptoms. The only medicine that has relieved the symptoms is prednisone.   At his initial visit with me he had no clinical evidence of inflammatory arthritis and I suspected neuropathic type pain and recommended XR of the cervical spine which was essentially normal and recommended nerve studies however, the patient had normal nerve studies done at the [facility] rheumatology clinic prior to that. Additionally, he now follows at the pain clinic and has not experienced relief with Lyrica and did not tolerate Duloxetine. He again presented today complaining of a burning sensation extending from the wrists down his hands and on exam aside from some bony enlargements he has no synovitis.     PLAN:      We had a long discussion with patient and his wife today who accompanied him. I explained that I suspect that his hand pain is non-inflammatory however would like to completely prove that he does not have an inflammatory arthropathy. So we all agreed with the following:   He will discontinue methotrexate and will obtain a diagnotic MSK ultrasound of the wrists and fingers in about a month to evaluate for synovitis   Will also get an XR of the hands today   I have messaged his GI to see if we can give him Celebrex since he will be off of methotrexate   If ultrasound shows synovitis we will restart immunosuppression     No follow-ups on file. with Carlus Pavlov PA-c and *** months with me.    ***ATTEST  ___________________________________________________________________________    Chief Complaint: bilateral hand burning pain***    HPI:  Kevin Tran is a 62 y.o. male with severe ulcerative colitis    Last clinic visit on 12/06/2021. Since last visit, patient has had no hospitalizations or ED visits.    ***      LABS/IMAGING:  EXAM: XR SACROILIAC JOINTS 3 OR MORE VIEWS  DATE: 03/22/2021 11:59 AM  FINDINGS:  Symmetric sacroiliac joints. Mild periarticular sclerosis, similar to the prior CT pelvis 03/16/2021 and likely degenerative. No erosions. Mild arthrosis at the symphysis pubis. Visualized joints are preserved    EXAM: XR CERVICAL SPINE AP AND LATERAL  DATE: 03/22/2021 11:59 AM  FINDINGS:Vertebral body heights are preserved. Normal cervical lordosis without listhesis.Intervertebral disc spaces are preserved. No facet degenerative changes.No prevertebral soft tissue swelling. Left-sided carotid artery calcifications. Visualized lung apices are negative.    EXAM: Magnetic resonance imaging, spinal canal and contents, cervical without contrast material.  DATE: 07/23/2016  IMPRESSION:  Small disc protrusion at C5-6. No significant spinal canal or foraminal narrowing.    No visits with results within 2 Month(s) from this visit.   Latest known visit with results is:   Office Visit on 12/24/2021   Component Date Value    Dysautonomia, Interpreta* 12/24/2021 SEE COMMENTS     IFA Notes 12/24/2021 None.     AChR Ganglionic Neuronal* 12/24/2021 0.00     ANNA Type  1 12/24/2021 Negative     AP3B2 IFA, S 12/24/2021 Negative     CRMP-5-IGG 12/24/2021 Negative     CASPR2-IgG CBA, Serum 12/24/2021 Negative     DPPX Ab IFA 12/24/2021 Negative     LGI1-IgG CBA, Serum 12/24/2021 Negative     PCA Type 2, Serum 12/24/2021 Negative     ANCA IFA 12/24/2021 Negative     MPO-Elisa 12/24/2021 Negative     MPO-Quant 12/24/2021 2.8     PR3 Elisa 12/24/2021 Negative PR3-Quant 12/24/2021 1.8     Sed Rate 12/24/2021 27 (H)     CRP 12/24/2021 <4.0     ENA Screen 12/24/2021 0.20      ROS: All systems were reviewed and negative except as noted in the HPI.    Past Medical History:   Diagnosis Date    Arthritis     Pneumothorax     Ulcerative colitis (CMS-HCC)        Past Surgical History:   Procedure Laterality Date    BACK SURGERY  1996    COLON SURGERY      COLONOSCOPY      LUNG SURGERY      PR COLONOSCOPY W/BIOPSY SINGLE/MULTIPLE Left 07/01/2018    Procedure: COLONOSCOPY, FLEXIBLE, PROXIMAL TO SPLENIC FLEXURE; WITH BIOPSY, SINGLE OR MULTIPLE;  Surgeon: [name], MD;  Location: [facility];  Service: Gastroenterology    PR COLONOSCOPY W/BIOPSY SINGLE/MULTIPLE  10/20/2019    Procedure: COLONOSCOPY, FLEXIBLE, PROXIMAL TO SPLENIC FLEXURE; WITH BIOPSY, SINGLE OR MULTIPLE;  Surgeon: Luanne Bras, MD;  Location: [facility] PROC[facility];  Service: Gastroenterology    PR LAP, SURG ENTEROLYSIS Midline 06/29/2020    Procedure: ROBOTIC XI LAPAROSCOPY, SURGICAL, ENTEROLYSIS (FREEING OF INTESTINAL ADHESION) (SEPARATE PROCEDURE);  Surgeon: [name], MD;  Location: MAIN OR [facility];  Service: Gastrointestinal    PR LAP, SURG ENTEROLYSIS Midline 12/12/2020    Procedure: ROBOTIC LAPAROSCOPY, SURGICAL, ENTEROLYSIS (FREEING OF INTESTINAL ADHESION) (SEPARATE PROCEDURE);  Surgeon: [name], MD;  Location: MAIN OR [facility]  Service: Gastrointestinal    PR LAP, SURG PROCTECTOMY W COLOSTOMY Midline 12/12/2020    Procedure: ROBOTIC XI LAPAROSCOPY, SURGICAL; PROCTECTOMY, COMPLETE, COMBINED ABDOMINOPERINEAL, WITH COLOSTOMY;  Surgeon: [name], MD;  Location: MAIN OR [place];  Service: Gastrointestinal    PR LAP,SURG,COLECTOMY,TOTAL,W/O PROCTECTOMY N/A 06/29/2020    Procedure: ROBOTIC XI LAPAROSCOPY, SURGICAL; COLECTOMY, TOTAL, ABDOMINAL, WITHOUT PROCTECTOMY, WITH ILEOSTOMY OR ILEOPROCTOSTOMY;  Surgeon: [name], MD;  Location: MAIN OR [facility]; Service: Gastrointestinal    PR RESECT SMALL INTEST,SINGL RESEC/ANAS Midline 12/12/2020    Procedure: ENTERECTOMY SM INTES; SNGL RESECT & ANASTOM;  Surgeon: [name], MD;  Location: MAIN OR [place];  Service: Gastrointestinal    PR SIGMOIDOSCOPY,BIOPSY N/A 03/28/2020    Procedure: SIGMOIDOSCOPY, FLEXIBLE; WITH BIOPSY, SINGLE OR MULTIPLE;  Surgeon: Luanne Bras, MD;  Location: [facility];  Service: Gastroenterology       Family History   Problem Relation Age of Onset    Cancer Mother         lung       Social History     Socioeconomic History    Marital status: Married   Tobacco Use    Smoking status: Former     Current packs/day: 0.00     Types: Cigarettes     Quit date: 2006     Years since quitting: 18.1     Passive exposure: Never    Smokeless tobacco: Never  Vaping Use    Vaping status: Never Used   Substance and Sexual Activity    Alcohol use: Yes    Drug use: No    Sexual activity: Yes     Partners: Female       No Known Allergies    Immunization History   Administered Date(s) Administered    Hep A / Hep B 11/12/2017, 12/16/2017    Influenza Vaccine Quad(IM)6 MO-Adult(PF) 12/16/2017, 12/06/2021    Influenza Virus Vaccine, unspecified formulation 11/26/2019        Current Outpatient Medications   Medication Instructions    acetaminophen (TYLENOL) 1,000 mg, Oral, Every 6 hours PRN    celecoxib (CELEBREX) 200 mg, Oral, Daily PRN    diazePAM (VALIUM) 5 MG tablet Two tablets 30 minutes prior to MRI    folic acid (FOLVITE) 1 mg, Oral, Daily (standard)    loperamide (IMODIUM A-D) 4 mg, Oral, 2 times a day PRN    multivitamin (TAB-A-VITE/THERAGRAN) per tablet 1 tablet, Oral, Daily (standard)    omeprazole (PRILOSEC OTC) 20 mg, Oral, Daily (standard)    pregabalin (LYRICA) 225 mg, Oral, 2 times a day (standard)    traMADol (ULTRAM) 50 mg, Oral, Daily PRN    tumeric-ging-olive-oreg-capryl 100 mg-150 mg- 50 mg-150 mg cap Oral       PHYSICAL EXAM:  There were no vitals taken for this visit.  General:   NAD   Skin  No inflammatory or vasculitic lesions. No sq nodules.    Eyes:   PERRL, conjunctiva and sclera not inflamed. Tears appear adequate.    ENT:   No oropharyngeal lesions. Mucous membranes moist.    Lymph:   No masses or cervical lymphadenopathy.    Cardiovascular:  Regular rate and rhythm. No murmur, rub, or gallop. No lower extremity edema.    Gastrointestinal system  Abdomen: soft, NT, ND, without OM. Normal bowel sounds.   RESPIRATORY:  Clear to auscultation. Normal respiratory effort.    Musculoskeletal:   The examination of the small joints of the hands did not reveal tenderness or soft tissue swelling at the DIP's, PIP's, MCP's or wrists.   Grip strength was normal.   No tenderness or soft tissue swelling were noted at the elbows, shoulders, knees, ankles or MTP's and toes.  Metatarsal squeeze test was negative bilaterally.  Range of motion of the cervical spine, shoulders and hips full in all planes.  No tender points were present.    Neurological:  CN 2-12 grossly intact. 5/5 strength on extremities.   Psych:  Appropriate affect and mood

## 2022-04-15 NOTE — Unmapped (Signed)
Department of Anesthesiology  [facility]  [address]  [place], [name] [address]  [phone]    Chronic Pain Follow Up Note  1. Idiopathic peripheral neuropathy    2. Bilateral hand pain    3. Chronic pain syndrome    4. Multiple joint pain      Assessment and Plan  Attending: [name] is a 62 y.o. 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 [facility] diffuse arthralgias and abdominal pain related to ostomy output.  He was first seen in April 2023.    The patient was referred to our clinic by General Surgery for reported significant LLQ abdominal pain near surgery site and continued high ostomy output but no other concerns after surgery.      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 His worst pain is related to rectal spasm and polyarthralgias, worst in the wrists (L > R) and bilateral hands. He did have XRAYs of the hands at [facility] in 2022 that were unremarkable.  He was started on lyrica due to initial concern for fibromyalgia and more neuropathic pain and is tolerating that well. He continues to follow with rheumatology for workup of arthritis (negative serologies, unlikely inflammatory arthritis) and is on methotrexate, previously prednisone (for UC prior to November surgery) but no longer on chronic steroids. He has done PT in the past and feels that he is as active (walking) as possible and benefits more from home exercises.     February 2024  At last visit in December, the patient returned reporting unchanged pain since prior visit. He had recently discontinued Butrans due to drowsiness and nausea with this medication. He reported that he did not apply his new patch recently when it was time to change it and noted an improvement in side effects. He stated he might have had minimal pain benefit from patch initiation. He inquired about something he could use on a more as-needed basis as he was concerned about taking pain medications and being able to work. We would initiate tramadol #20 tablets per month to be used as-needed for pain control. The patient would need to schedule with pain psychology for evaluation of appropriateness for opioid management and to assist with pain coping strategies.    Today, the patient returns reporting unchanged pain since last visit. He did confirm benefit since starting Tramadol 50 mg daily and denies any adverse side effects. He notes this medication allows him to work without drowsiness or dizziness like he had with Butrans. He received an MRI Cervical Spine (03/13/22) which shows small disc protrusion at C5-6, unchanged, mild spondylotic changes in the cervical spine with no significant spinal canal stenosis or neuroforaminal narrowing, and no evidence of syringomyelia/hydromyelia. We discussed referring patient to a hand doctor for potential injections though patient defers at this time given he would have to get injections in his wrist which is his primary pain generator. We discussed the potential of using a TENS unit and patient is amenable to trialing this. We also discussed increasing quantity of Tramadol 50 mg to 30 tablets monthly so  he is able to take one tablet before work each day. We will refill Lyrica 225 mg BID with no changes.     Urine toxicology: 07/31/21  Urine toxicology screen appropriate  Last Opioid Change: Start butrans 10/2021, 01/2022-stop butrans, start tramadol 50 mg #20, 04/2022- Increase tramadol to #30  Opioid agreement-10/2021  Last EKG:  None  Previous Compliance Issues: None  Naloxone ordered: N/A  Total morphine equivalents: 5  Benzodiazepine: No.  Pain Psychology: New referral order placed today for both COM and pain coping skills, CBT, other treatments as appropriate   DOC: None  [place]CCS[facility] database was reviewed 04/16/22.      Peripheral neuropathy of hands   - Underwent EMG, which was normal  - Not interested in scheduling biopsy for SFPN evaluation  - IFC unit ordered for neuropathic hand pain    Polyarthritis of unknown etiology - Bilateral Wrist Pain   - Continue following with Rheumatology    Chronic pain syndrome   - Increase Tramadol 50 mg daily PRN to #30 tablets monthly  - Continue Lyrica 225 mg BID  - Continue home PT exercises and activity (enjoys walking)    Future Considerations:  - Right lumbar MBBs  - Consider muscle relaxant for rectal spasm    Return in about 3 months (around 07/17/2022).    No orders of the defined types were placed in this encounter.    Requested Prescriptions     Signed Prescriptions Disp Refills    traMADol (ULTRAM) 50 mg tablet 30 tablet 2     Sig: Take 1 tablet (50 mg total) by mouth daily as needed for pain.     HPI:  Kevin Tran is seen in consultation at the request of [name], MD  For evaluation and recommendations regarding His chronic pain.     Patient was last seen in December. Since then, the patient has followed with General Surgery and ENT.     Today, the patient reports overall unchanged pain since last visit. He reports receiving a MRI Cervical Spine per Dr. Delbert Phenix because pain could be stemming from his neck, though was informed it was not the etiology of pain. He reports he thinks he may have some arthritis. He notes the pain is primarily in bilateral wrists and hands with intermittent shoulder pain. He reports he can barely make a fist. He reports stiffness in the bilateral hands. He reports he received an EMG in the past that did not show any significant results.     In terms of medications, the patient confirms benefit with Tramadol 50 mg daily with no adverse side effects compared to Butrans patch. He notes he tried using topical medication to help with pain though this was ineffectiveness due to constantly washing his hands when emptying his ostomy bag and inhibiting his ability to work. He confirms taking Lyrica 225 mg BID.     Current Medications Regime:  Lyrica 225mg  BID  Butrans-stopped    The patient states his pain is located bilateral shoulders and wrists and the severity of his pain ranges from 4/10 to 5/10.  His pain currently is 5/10 and on average is 5/10. He describes the sensation of his pain as aching, burning, shooting. His pain is present all of the time and worst mornings. The patient???s pain impacts enjoyment of life, mood, normal work. His interval history includes None. His pain has stayed the same, and he does not have new pain to discuss today. He is not on blood  thinners or anti-coagulants. In regards to medications currently taken for pain management, the patient is tolerating these medications well and complains of associated side effects: None.    Previous Medication Trials: Diclofenac gel/voltaren gel, duloxetine/cymbalta (side effects), gabapentin, methocarbamol/robaxin, Naproxen/Aleve, and Pregabalin/Lyrica, compounded topical (not helpful), butrans (side effects), tramadol     Prior interventions include medications, PT.   Referral to neurology 10/2021  Pain psychology referral 01/2022    Prior imaging include MRI, XR, CT, Nerve Conduction Study.     The treatment goals include Complete resolution with medications if necessary, Ability to return to some type of employment, and Ability to return to previous daily routine and activity     Allergies as of 04/16/2022    (No Known Allergies)      Current Outpatient Medications   Medication Sig Dispense Refill    acetaminophen (TYLENOL) 500 MG tablet Take 2 tablets (1,000 mg total) by mouth every six (6) hours as needed for pain.      celecoxib (CELEBREX) 200 MG capsule Take 1 capsule (200 mg total) by mouth daily as needed for pain. 90 capsule 1    diazePAM (VALIUM) 5 MG tablet Two tablets 30 minutes prior to MRI 4 tablet 0    folic acid (FOLVITE) 1 MG tablet Take 1 tablet (1 mg total) by mouth daily.      loperamide (IMODIUM A-D) 2 mg tablet Take 2 tablets (4 mg total) by mouth two (2) times a day as needed for diarrhea. 60 tablet 3    multivitamin (TAB-A-VITE/THERAGRAN) per tablet Take 1 tablet by mouth daily.      omeprazole (PRILOSEC OTC) 20 MG tablet Take 1 tablet (20 mg total) by mouth daily.      pregabalin (LYRICA) 225 MG capsule Take 1 capsule (225 mg total) by mouth two (2) times a day. 60 capsule 2    tumeric-ging-olive-oreg-capryl 100 mg-150 mg- 50 mg-150 mg cap Take by mouth.      traMADol (ULTRAM) 50 mg tablet Take 1 tablet (50 mg total) by mouth daily as needed for pain. 30 tablet 2     No current facility-administered medications for this visit.     Imaging/Tests:   XR SIJ 03/22/21  FINDINGS:   Symmetric sacroiliac joints. Mild periarticular sclerosis, similar to the prior CT pelvis 03/16/2021 and likely degenerative. No erosions. Mild arthrosis at the symphysis pubis. Visualized joints are preserved.   IMPRESSION  No radiographic findings of inflammatory arthropathy.     CT Pelvis 03/16/21  FINDINGS:       BLADDER: Partially distended, otherwise unremarkable.       PELVIC/REPRODUCTIVE ORGANS: Borderline enlarged prostate, measuring up to 4.8 cm.       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 The rectal stump is unremarkable.       PERITONEUM/RETROPERITONEUM AND MESENTERY: No free air. Mild interval decrease in previously described small volume of loculated fluid in the dependent pelvis/presacral space (5:67).       LYMPH NODES: No enlarged lymph nodes.       VESSELS: Scattered calcified and noncalcified atheromatous plaques of the abdominal aorta and its branch vessels, which appear normal in caliber.       BONES AND SOFT TISSUES: Multilevel degenerative disc disease. No suspicious osseous lesions. Postsurgical changes of the anterior abdominal wall. Question tiny soft tissue tract extending to the left perianal soft tissues at approximately the 5:00 position (2:53). No drainable perianal fluid collection.   IMPRESSION  --  Sequelae of prior total colectomy with left lower quadrant end ileostomy. Mild interval decrease in previously described small volume partially loculated fluid in the dependent pelvis/presacral space.   --Tiny soft tissue tract extending through the left perianal soft tissues at approximately the 5:00 position, which may represent an old scarred down perianal fistulous tract. No drainable perirenal fluid collection.     CT Abdomen 01/21/21  FINDINGS:       LOWER CHEST: Redemonstrated right lower lobe subsegmental atelectasis versus scarring. Left-sided gravity dependent atelectasis. Otherwise unremarkable.       LIVER: Normal liver contour.  No focal liver lesions. Small volume focal fat along the falciform and gallbladder fossa, unchanged from 03/27/20.       BILIARY: No biliary ductal dilatation.  The gallbladder is physiologically distended and normal in appearance.       SPLEEN: Normal in size and contour.       PANCREAS: Normal pancreatic contour.  No focal lesions.  No ductal dilation.       ADRENAL GLANDS: Normal appearance of the adrenal glands.       KIDNEYS/URETERS: Symmetric renal enhancement.  No hydronephrosis.  No nephrolithiasis.       BLADDER: Physiologically distended and otherwise unremarkable.       REPRODUCTIVE ORGANS: Unremarkable.       GI TRACT: Postsurgical sequela of total colectomy with right lower quadrant ileostomy. Contrast opacifies the stomach and majority of the small bowel, but does not extend to the ileostomy site, likely due to contrast timing. There is also absence of contrast opacification in the right lower quadrant, although there are no abnormally dilated loops of proximal small bowel, and this is likely secondary to contrast timing. Rectal stump decompressed. No findings of acute inflammation.  Surgically absent appendix.       PERITONEUM, RETROPERITONEUM AND MESENTERY: There is mild swirling of the mesentery in the right lower quadrant, likely post-operative. No free air.  No ascites.  Small volume of simple free fluid in the dependent pelvis, likely normal postsurgical sequela.       LYMPH NODES: No adenopathy.       VESSELS: Hepatic and portal veins are patent.  Normal caliber aorta.  Moderate scattered atherosclerotic calcifications of the abdominal aorta and its branching vessels. Accessory left renal vein arising from the inferior pole.       BONES and SOFT TISSUES: Right lower quadrant ileostomy changes and additional anterior abdominal wall postsurgical changes. No focal rim-enhancing fluid collection. No aggressive osseous lesions.  No focal soft tissue lesions. Multilevel degenerative changes of the visualized spine, most severe at L5-S1 with loss of intervertebral disc space and vacuum disc phenomenon.   IMPRESSION  Sequela of interval total colectomy with right lower quadrant ileostomy. Loculated fluid is identified in the presacral region, however, there is no evidence of discrete wall enhancement to suggest abscess formation at this time.       Additional chronic and incidental findings as above.     XRAY hand results in care everywhere 10/2020    Lab Results   Component Value Date    CREATININE 1.1 03/16/2021     Lab Results   Component Value Date    ALKPHOS 67 11/24/2020    BILITOT 0.7 11/24/2020    BILIDIR 0.10 04/30/2018    PROT 7.5 11/24/2020    ALBUMIN 4.1 11/24/2020    ALT 36 09/10/2021    AST 21 09/10/2021     Lab Results   Component Value Date  PLT 265 09/10/2021     Review of Systems:  8 systems reviewed and negative except as mentioned in HPI    He denies any homicidal or suicidal ideation.     OBJECTIVE  PHYSICAL EXAM:  BP 143/87  - Pulse 65  - Temp 36.2 ??C (97.2 ??F) (Skin)  - Resp 16  - Ht 172.7 cm (5' 8)  - Wt 78.5 kg (173 lb)  - SpO2 97%  - BMI 26.30 kg/m??   Wt Readings from Last 3 Encounters:   04/16/22 78.5 kg (173 lb)   03/05/22 78.6 kg (173 lb 4.8 oz)   01/30/22 77.6 kg (171 lb)     GENERAL: Well developed, well-nourished male and is in no apparent distress. The patient is pleasant and interactive. Patient is a good historian. No evidence of sedation or intoxication.    HEENT: Normocephalic/atraumatic.   CARDIOVASCULAR: Regular rate  RESPIRATORY:  Normal work of breathing, no supplemental 02  EXTREMITIES: No clubbing, cyanosis noted.  NEUROLOGIC: Alert and oriented, speech fluent, normal language. Cranial nerves grossly intact.    MUSCULOSKELETAL:  Motor function preserved.  Patient rises from a seated position with no difficulty.  The patient was able to ambulate with no difficulty throughout the clinic today without the assistance of a walking aid-None.     SKIN: No obvious rashes, lesions, or erythema.  PSY: Appropriate, full range affect, no psychomotor retardation    Documentation assistance was provided by Darcel Smalling, on April 16, 2022 at 9:27 AM for Galen Daft, NP.    I personally spent 34 minutes face-to-face and non-face-to-face in the care of this patient, which includes all pre, intra, and post visit time on the date of service.  All documented time was specific to the E/M visit and does not include any procedures that may have been performed.  We are delivering comprehensive, continuous, longitudinal care for this patient with chronic pain.   April 16, 2022 11:36 AM. Documentation assistance provided by the Scribe. I was present during the time the encounter was recorded. The information recorded by the Scribe was done at my direction and has been reviewed and validated by me. Rhina Brackett, FNP-C

## 2022-04-16 ENCOUNTER — Ambulatory Visit: Admit: 2022-04-16 | Discharge: 2022-04-17 | Payer: PRIVATE HEALTH INSURANCE

## 2022-04-16 DIAGNOSIS — G609 Hereditary and idiopathic neuropathy, unspecified: Principal | ICD-10-CM

## 2022-04-16 DIAGNOSIS — M25531 Pain in right wrist: Principal | ICD-10-CM

## 2022-04-16 DIAGNOSIS — M79642 Pain in left hand: Principal | ICD-10-CM

## 2022-04-16 DIAGNOSIS — G894 Chronic pain syndrome: Principal | ICD-10-CM

## 2022-04-16 DIAGNOSIS — M79641 Pain in right hand: Principal | ICD-10-CM

## 2022-04-16 DIAGNOSIS — M25532 Pain in left wrist: Principal | ICD-10-CM

## 2022-04-16 DIAGNOSIS — M2550 Pain in unspecified joint: Principal | ICD-10-CM

## 2022-04-16 MED ORDER — TRAMADOL 50 MG TABLET
ORAL_TABLET | Freq: Every day | ORAL | 2 refills | 30 days | Status: CP | PRN
Start: 2022-04-16 — End: ?

## 2022-04-16 MED ORDER — PREGABALIN 225 MG CAPSULE
ORAL_CAPSULE | Freq: Two times a day (BID) | ORAL | 2 refills | 30 days | Status: CP
Start: 2022-04-16 — End: ?

## 2022-04-16 NOTE — Unmapped (Signed)
Approved    Prior authorization approved  Payer: [id number] Case ID: [id number]  Note from payer: Approved.  Approval Details    Authorized from April 16, 2022 to May 16, 2022  Electronic appeal: Not supported  View History  Medication Being Authorized    traMADol (ULTRAM) 50 mg tablet  Take 1 tablet (50 mg total) by mouth daily as needed for pain.  Dispense: 30 tablet Refills: 2   Start: 04/16/2022   Class: Normal   This order has been released to its destination.  To be filled at: [id number] - [place], [place] - [address]

## 2022-04-16 NOTE — Unmapped (Signed)
Medication:  Tramadol.  Patient states he has not had any medication for a while and didn't bring the bottle in.

## 2022-04-16 NOTE — Unmapped (Signed)
It was good to see you today.    We will increase your tramadol so you are able to take 1 tablet daily as needed.    I have ordered a Zynex machine for hand pain.    We will see you in 3 months, or sooner if needed.

## 2022-04-19 NOTE — Unmapped (Signed)
Faxed demographics, notes, and Zynex  rx and letter of necessity to [phone]

## 2022-05-13 DIAGNOSIS — K51919 Ulcerative colitis, unspecified with unspecified complications: Principal | ICD-10-CM

## 2022-05-15 MED ORDER — FOLIC ACID 1 MG TABLET
ORAL_TABLET | ORAL | 2 refills | 0 days | Status: CP
Start: 2022-05-15 — End: ?

## 2022-05-15 NOTE — Unmapped (Signed)
Refill request for folic acid sent to the pharmacy for 9 month supply. Pt up to date on appts.

## 2022-06-18 MED ORDER — TRAMADOL 50 MG TABLET
ORAL_TABLET | 0 refills | 0 days
Start: 2022-06-18 — End: ?

## 2022-06-19 MED ORDER — TRAMADOL 50 MG TABLET
ORAL_TABLET | 0 refills | 0 days
Start: 2022-06-19 — End: ?

## 2022-06-24 NOTE — Unmapped (Signed)
[name]   FOLLOW UP NOTE - INFLAMMATORY BOWEL DISEASE  06/25/2022    Demographics:  Kevin Tran is a 62 y.o. year old male    Diagnosis:  Ulcerative Colitis  Disease onset (yr):  2018  Age at onset:  > 70yr old (A3)  Location:  Left-sided (E2)  Behavior:  Colitis  s/p TAC with end ileostomy          HPI / NOTE :     Interval Events:   1.  Last seen by me Nov 2023. We discussed increasing lomotil to 8 tabs/day for high ostomy output and dscussed seeing colorectal surgery at [place] for a second opinion.   2. I reviewed recent notes from Pain Management clinic [facility]) - Rx Buneorphine, Tramadol  3.  He had headaches with lomotil, so change to imodium  4.  Saw Dr. Elenore Rota at [facility] 01/30/22, notes reviewed. They discussed potential ostomy revision surgery to improve pouching and quality of life however patient has deferred this for now.    HPI:   Has been doing better--changed his ostomy bag type which has helped with leakage. Off lomotil and taking 1 pill imodium BID only. Still with watery stools after every meal. Changing bag 2x week. Emptying ~5x daily but depends on what he eats.    Denies abdominal pain but is having pain deep in pelvis around rectum. Denies discharge.    Abdominal pain (0-10): 0  BM a day: empties ostomy bag 5x/day  Consistency: watery   % of stools have blood: 0%  Nocturnal BM: NA ostomy  Urgency: NA ostomy  Weight change over last 6 mo:  Stable ~155-160lb (not gaining but not losing)  Smoking: no  NSAIDS: avoids    Review of Systems:   Review of systems positive for: fatigue  Otherwise, the balance of 10 systems is negative           IBD HISTORY:     Year of disease onset:  2018    Brief IBD Disease Course:  In late 2018 developed bloody diarrhea and incontinence. Colonoscopy in Feb 2019 showed edema and erythema in the rectum and sigmoid with decreased vascularity in the rest of the colon; the TI appeared normal. He was treated with courses of prednisone for much of 2019 and also put on mesalamine without improvement. In Fall 2019 he was started on Humira w/o improvement. In Dec 2019 he had moderate level antibodies to humira; was added; mesalamine was stopped; put on prednisone taper; rapidly improved  - Jan 2020 - stopped mesalamine, used weekly Humira + with initial improvement.   - April 2020 - notable worsening of diarrhea and abdominal pain  Colonoscopy with moderate inflammation mainly in rectum.  Changed to [name] 06/2018.  Had limited response initially, but improved with Uceris foam 09/2018.    - 09/2018 - taper to xeljanz 5mg  BID.  Nov 2020 - ongoing proctitis symptoms, restarted Uceris foam.   - 06/2019 - severe UC flare on [name], plan to restage disease and change therapy  - 10/2019 - moderate proctitis (up to ~20cm), started canasa + Stelara.  Good symptom response to Stelara initially, but had severe arthralgias.   - Feb 2022 - worsening UC flare despite Stelara.  Admitted to [facility], discharged on prednisone taper.  Recurrent symptoms, readmitted 1 week later, started on IV Remicade 5mg /kg in the hospital on 04/06/2020. Low trough level April 2022 - increased dose to 10mg /kg.    -  May 2022 - poor response to high dose Remicade.  Colectomy 06/29/2020 ([facility]). Surg path - severe chronic active colitis c/w UC.    Endoscopy:      - Colonoscopy Feb 2019: edema + erythema in rectum and sigmoid; decreased vascularity in rest of colon  - Dec 2019: moderate inflammation throughout the colon  - Colonoscopy 07/01/2018 - poor prep, moderate proctitis up to 15cm, mild patchy nodularity in remaining colon.  Normal terminal ileum.   - Colonoscopy 10/20/2019 - hemorrhoids.  Moderate (Mayo 2) colitis from anal verge up to 10cm.  Mild colitis from 10-20cm, then normal colon.  Normal terminal ileum.    PATH:  Moderately active chronic colitis  - Sigmoidoscopy 03/28/2020 - moderate (Mayo 2) colitis through the extent of the exam (up to the descending colon at least).    PATH:  Severe chronic active colitis with no CMV    Imaging:    - CT 01/28/18: left sided colitis; liver lesion (see below)  - MRI abdomen to evaluate liver lesion 02/09/18 - aforementioned liver lesion felt to be focal fatty infiltration;  6 month repeat recommended    Prior IBD medications (type, dose, duration, response):  [x]  5-ASAs - Mesalamine  - no improvement; possible paradoxical diarrhea  [x]  Oral corticosteroids - prednisone.  Good response with budesonide foam.   []  Intravenous corticosteroids  []  Antibiotics  [x]  Thiopurines: started in Dec 2019  []  Methotrexate  [x]  Anti-TNF therapies - Humira since Sept or Oct 2019. Low level and low titer Ab in Dec 2019. Increased to weekly dosing. Stopped spring 2020 due to non-response despite good drug levels.   []  Anti-Integrin therapies  []  Anti-Interleukin therapies  [x]  Anti-JAK therapies - Xeljanz 10mg  started 06/2018, tapered to 5mg  bid 09/2018  []  Cyclosporine  []  Clinical trial medication  []  Other (Please specify):    Extraintestinal manifestations:   -joint pains affecting: y, peripheral arthralgias  -eye: n  -skin: n  -oral ulcers :  n  -blood clots: n  -PSC: n  -other: n          Past Medical History:   Past medical history:   Past Medical History:   Diagnosis Date    Arthritis     Pneumothorax     Ulcerative colitis (CMS-HCC)      Past surgical history:   Past Surgical History:   Procedure Laterality Date    BACK SURGERY  1996    COLON SURGERY      COLONOSCOPY      LUNG SURGERY      PR COLONOSCOPY W/BIOPSY SINGLE/MULTIPLE Left 07/01/2018    Procedure: COLONOSCOPY, FLEXIBLE, PROXIMAL TO SPLENIC FLEXURE; WITH BIOPSY, SINGLE OR MULTIPLE;  Surgeon: [name], MD;  Location: [facility];  Service: Gastroenterology    PR COLONOSCOPY W/BIOPSY SINGLE/MULTIPLE  10/20/2019    Procedure: COLONOSCOPY, FLEXIBLE, PROXIMAL TO SPLENIC FLEXURE; WITH BIOPSY, SINGLE OR MULTIPLE;  Surgeon: Luanne Bras, MD;  Location: [facility];  Service: Gastroenterology    PR LAP, SURG ENTEROLYSIS Midline 06/29/2020    Procedure: ROBOTIC XI LAPAROSCOPY, SURGICAL, ENTEROLYSIS (FREEING OF INTESTINAL ADHESION) (SEPARATE PROCEDURE);  Surgeon: [name], MD;  Location: MAIN OR [facility];  Service: Gastrointestinal    PR LAP, SURG ENTEROLYSIS Midline 12/12/2020    Procedure: ROBOTIC LAPAROSCOPY, SURGICAL, ENTEROLYSIS (FREEING OF INTESTINAL ADHESION) (SEPARATE PROCEDURE);  Surgeon: [name], MD;  Location: [facility];  Service: Gastrointestinal    PR LAP, SURG PROCTECTOMY W  COLOSTOMY Midline 12/12/2020    Procedure: ROBOTIC XI LAPAROSCOPY, SURGICAL; PROCTECTOMY, COMPLETE, COMBINED ABDOMINOPERINEAL, WITH COLOSTOMY;  Surgeon: [name], MD;  Location: MAIN OR [facility];  Service: Gastrointestinal    PR LAP,SURG,COLECTOMY,TOTAL,W/O PROCTECTOMY N/A 06/29/2020    Procedure: ROBOTIC XI LAPAROSCOPY, SURGICAL; COLECTOMY, TOTAL, ABDOMINAL, WITHOUT PROCTECTOMY, WITH ILEOSTOMY OR ILEOPROCTOSTOMY;  Surgeon: [name], MD;  Location: MAIN OR [facility];  Service: Gastrointestinal    PR RESECT SMALL INTEST,SINGL RESEC/ANAS Midline 12/12/2020    Procedure: ENTERECTOMY SM INTES; SNGL RESECT & ANASTOM;  Surgeon: [name], MD;  Location: MAIN OR [place];  Service: Gastrointestinal    PR SIGMOIDOSCOPY,BIOPSY N/A 03/28/2020    Procedure: SIGMOIDOSCOPY, FLEXIBLE; WITH BIOPSY, SINGLE OR MULTIPLE;  Surgeon: Luanne Bras, MD;  Location: [facility];  Service: Gastroenterology     Family history:   Family History   Problem Relation Age of Onset    Cancer Mother         lung     Social history:   Social History     Socioeconomic History    Marital status: Married     Spouse name: None    Number of children: None    Years of education: None    Highest education level: None   Tobacco Use    Smoking status: Former     Current packs/day: 0.00     Types: Cigarettes     Quit date: 2006     Years since quitting: 18.3 Passive exposure: Never    Smokeless tobacco: Never   Vaping Use    Vaping status: Never Used   Substance and Sexual Activity    Alcohol use: Yes    Drug use: No    Sexual activity: Yes     Partners: Female           Allergies:   No Known Allergies          Medications:     Current Outpatient Medications   Medication Instructions    acetaminophen (TYLENOL) 1,000 mg, Oral, Every 6 hours PRN    celecoxib (CELEBREX) 200 mg, Oral, Daily PRN    diazePAM (VALIUM) 5 MG tablet Two tablets 30 minutes prior to MRI    folic acid (FOLVITE) 1 mg, Oral    loperamide (IMODIUM A-D) 4 mg, Oral, 2 times a day PRN    mesalamine (CANASA) 1,000 mg, Rectal, Nightly    multivitamin (TAB-A-VITE/THERAGRAN) per tablet 1 tablet, Oral, Daily (standard)    omeprazole (PRILOSEC OTC) 20 mg, Oral, Daily (standard)    pregabalin (LYRICA) 225 mg, Oral, 2 times a day (standard)    traMADol (ULTRAM) 50 mg, Oral, Daily PRN    tumeric-ging-olive-oreg-capryl 100 mg-150 mg- 50 mg-150 mg cap Oral           Physical Exam:   BP 113/71 (BP Site: R Arm, BP Position: Sitting, BP Cuff Size: Large)  - Pulse 67  - Temp 36.3 ??C (97.3 ??F) (Temporal)  - Ht 172.7 cm (5' 8)  - Wt 76.2 kg (168 lb)  - BMI 25.54 kg/m??     GEN: no apparent distress, appears comfortable on exam  HEENT: OP clear with no erythema, lesions, exudate, mucous membranes moist  ABD: Soft, nontender, no rebound/guarding, nondistended. Stoma site appears healthy  Extremities: no cyanosis, clubbing or edema, normal gait  Psych: affect appropriate, A&O x3  SKIN: no visible lesions on face, neck, arms, abdomen          Labs, Data &  Indices:     Lab Review:   Lab Results   Component Value Date    WBC 7.0 09/10/2021    WBC 4.9 02/22/2021    RBC 4.74 02/22/2021    HGB 13.9 09/10/2021    HGB 12.8 (L) 02/22/2021     Lab Results   Component Value Date    AST 21 09/10/2021    ALT 36 09/10/2021    BUN 20 12/22/2020    BUN 14 08/04/2019    Creatinine Whole Blood, POC 1.1 03/16/2021    CO2 25.0 12/22/2020    CO2 24 08/04/2019    Albumin 4.1 11/24/2020    Calcium 10.1 12/22/2020    Calcium 10.0 08/04/2019     No results found for: TSH      Diagnosis ICD-10-CM Associated Orders   1. Ulcerative colitis with complication, unspecified location (CMS-HCC)  K51.919 mesalamine (CANASA) 1000 MG suppository      2. Ileostomy present (CMS-HCC)  Z93.2       3. High output ileostomy (CMS-HCC)  R19.8     Z93.2       4. Ileostomy dysfunction (CMS-HCC)  K94.13               Assessment & Recommendations:   Disease state: ulcerative pancolitis, post-op    KASIEM ESHELMAN is a 62 y.o. male with ulcerative pancolitis (mostly proctosigmoiditis) since 2018. He was previously treated with Humira (mechanistic loss of response), Xeljanz (non-response) and Stelara (non-response).  He had a severe UC flare in Feb 2022 but had progressive colitis despite starting Infliximab. Hence, we proceeded with surgery (06/29/20) and he is status post colectomy with an end ileostomy.  Unfortunately he had an attempted IPAA surgery in November 2022 but there were intra-operative complications (bleeding, dusky appearance of the pouch) and the pouch creation had to be aborted. He hence has an end-ileostomy. Spoke with Dr. Elenore Rota for second opinion, discussed potential ostomy revision surgery to improve pouching and quality of life but he has deferred for now. Biggest issues affecting quality life continue to be ostomy output and intermittent leaking. Discussed further uptitration with anti-diarrheals and dietary changes that may help withoutput. Will have wound ostomy nurse see patient today to discuss ostomy options and reorder supplies. Will also trial canasa suppositories for rectal pain as he may have some inflammation at his residual rectal stump.    Plan:  Increase imodium from 1 pill BID to 2 pills BID to TID. Discussed foods that can also help to thicken stools  Canasa suppositories nightly for rectal pain    IBD health maintenance:  Influenza vaccine: given in Fall 2019  Pneumonia vaccine:   Hepatitis B: received twinrix 2 of 3;   TB testing: Quant gold neg in March 2019  Chickenpox/Shingles history: Shingrix completed 09/2018  Bone denistometry:   Derm appointment:  Last colonoscopy: 10/2019  -----------------------------------------------------------  Patient seen and discussed with Dr. Raphael Gibney. Patient to follow-up in 6 months, sooner if any new issues.    Garnette Czech, MD  Gastroenterology & Hepatology Fellow  [place] [facility]   ===============================================  I saw and evaluated Franchot Erichsen with GI fellow Dr. Julieta Bellini.  I participated in key portions of the service and personally reviewed the plan of care with the patient.  I reviewed the fellow's note and agree with the fellow???s findings and plan.     Additional thoughts are below: Main focus at the moment is trying to get better seal and management of  his ostomy. He would like to avoid surgery if possible, but may consider ostomy revision with Dr. Elenore Rota if still having issues. Regarding high ostomy output, we will increase his imodium dose (to 4-8 pills/day).     He is having rectal pain/spasms which may be due to untreated inflammation in the rectal stump.  Suggested a short trial of suppositories to see if this may help.     I personally discussed ostomy care plan with WOCN Frederick Peers - will try a precut 19mm firm convex ostomy appliance to see if that provides a better seal and less skin irritation.     [name], MD  Associate Professor of Medicine  Division of Gastroenterology & Hepatology  [place] [facility]

## 2022-06-25 ENCOUNTER — Ambulatory Visit: Admit: 2022-06-25 | Discharge: 2022-06-26 | Payer: PRIVATE HEALTH INSURANCE

## 2022-06-25 DIAGNOSIS — R198 Other specified symptoms and signs involving the digestive system and abdomen: Principal | ICD-10-CM

## 2022-06-25 DIAGNOSIS — Z932 Ileostomy status: Principal | ICD-10-CM

## 2022-06-25 DIAGNOSIS — K51919 Ulcerative colitis, unspecified with unspecified complications: Principal | ICD-10-CM

## 2022-06-25 DIAGNOSIS — K9413 Enterostomy malfunction: Principal | ICD-10-CM

## 2022-06-25 MED ORDER — MESALAMINE 1,000 MG RECTAL SUPPOSITORY
Freq: Every evening | RECTAL | 0 refills | 30 days | Status: CP
Start: 2022-06-25 — End: 2023-06-25

## 2022-06-25 NOTE — Unmapped (Signed)
[facility]  OSTOMY CLINIC VISIT NOTE     Reason for Consult:   - Ileostomy  - Pouching Issue    Problem List:   Active Problems:    * No active hospital problems. *    Assessment: .Pt requesting WOCN consult regarding peristomal skin irritation. Appliance removed with adhesive remover, cleansed with warm water and allowed to air dry.      Stoma Type:  -  Ileostomy Stoma Location:  - RLQ (Right Lower Quadrant)     Stoma Characteristics:  - Round  - Budded  - Minimal height  - Diameter 19mm Stoma Mucosal Condition and Color:  - Moist  - Red     Mucocutaneous Junction:  - Intact    Rod/Stents:  - No     Output:  - Brown  - Liquid  - Stool     Peristomal Skin Condition:   - Erythema  - Denuded  - Treated with crusting   - Location of skin impairment circumferentially to stom stoma extending 10.56mm     Abdominal Contours:  - Flat  - Soft    Pouching System:- Current   - 1 Piece  - Convex  - CTF (Cut to fit)  - Ostomy belt Anticipated Wear Time of Pouching System:  - varies- 1-2days      Teaching/Instructions:  - Crusting peristomal skin irritation.  - Reviewed when to contact the outpatient WOC nurse with questions/concerns.    Recommendations/Plan:   - Patient to follow up with outpatient WOC nurse.  - Trial precut firm convexity system   Plan of Care Discussed With:  - Patient  - Family    Ostomy Supplies:   - Ostomy pouching samples provided- Requested [id number] / [phone]    Ostomy Product List:  TBD     WOCN Follow Up:  - Pt will follow up after trial     Workup Time:  30 minutes    Dorris Fetch MSN RN [facility]  [facility]

## 2022-06-25 NOTE — Unmapped (Addendum)
Kevin Tran it was a pleasure seeing you today.  Here is a summary from today's visit:     1.  Can increase the imodium to two pills three times a day  2.  Recommend eating carbs and starch (like potatoes) to help thicken--things like nut butters, marshmallows can be helpful too  3. Try the suppositories every night to help with rectal irritation/inflammation    If any trouble or symptoms, do not hesitate to call.     [name], MD  Associate Professor of Medicine  Division of Gastroenterology & Hepatology  [place] [facility]        EXPECTATIONS FOR PATIENT FOLLOW UP AND COMMUNICATION:  -- Follow up Appointments:  Crohn's disease and Ulcerative colitis are serious chronic inflammatory diseases which require close monitoring.  I expect to see patients for a follow up visits at least every 6 months (or more often if you are having a flare, starting new therapy, etc).  In select cases, patients who are only on aminosalicylates / mesalamine, may be seen once per year for follow up.  If you are not able to come for follow up appointments we may not be able to refill your medications or continue caring for you.   -- Appointment Type: While we are continuing to offer video appointments in select cases (stable patients who are not in a flare and without new/active symptoms) during the COVID19 pandemic, I expect to see patients in person at least once per year.   -- Communication:  if you have any questions or concerns, you can communicate with [name] via phone (contact information below) or via myChart.  Note that phone messages are given higher priority and triaged first. For urgent issues, please contact us via phone.      IBD NURSE COORDINATOR CONTACT - Christy Gentles, RN     Phone: [phone] (direct line)      Fax: [phone]  * For urgent medical concerns after hours or on weekends and holidays, call [phone] and ask to speak to the GI Fellow on call.    * If you have a GI medical question or GI symptoms and would like to speak to your provider's healthcare team, please contact Christy Gentles, RN (contact information above) OR you can send the GI healthcare team a message through MyChart at [email or web address]    [facility] MESSAGES  -- MyChart messages and advice requests are now going to be sent to our clinical care team. A team member will respond to the message or send it to your provider if needed.   -- The message will typically be addressed within 3-4 business days if it is an issue that can be handled by MyChart.   -- For multiple questions, complex concerns, and/or if you have not been by seen by your provider in the recommended follow-up period you may be asked to schedule a clinic visit.   -- MyChart messages are NOT read after hours or on weekends. MyChart is NOT meant for urgent issues or emergencies. For emergencies call 911 or go to the nearest emergency department.     APPOINTMENT SCHEDULING FOR GI CLINIC AND GI PROCEDURES:  RADIOLOGY - to schedule imaging ordered, please call [phone]  GI PROCEDURES         [phone] option 2   GI MEDICINE CLINIC  [phone] option 1   *To schedule, reschedule, or cancel your GI appointment, please call [phone]. If you are unable to come to  an appointment, please notify us as soon as possible, preferably 24 hours in advance. Doing so may allow other patients with urgent needs to be scheduled in a cancelled appointment slot.     TEST RESULTS   If you have a MyChart account, your new results and a provider message will be sent to you through your MyChart account at [email or web address]. For results that require follow-up, a member of your healthcare team will also contact you.    PRESCRIPTION REFILL REQUESTS  To request prescription refills, please contact your pharmacy or send your healthcare team a message through your MyChart account at [email or web address]  RECORD REQUESTS  For questions related to medical records, please call Medical Records Release of Information at [phone]  FINANCIAL NAVIGATOR   For billing and other financial questions/needs - please contact Kathrin Greathouse - [phone]. If you need to leave a message, please be sure to leave your full name, date of birth or Medical record number, best call back # and reason for call.    For educational material and resources:  [email or web address]    ================================================================

## 2022-07-08 NOTE — Unmapped (Unsigned)
Department of Anesthesiology  [facility]  [address]  [place], [name] [address]  [phone]    Chronic Pain Follow Up Note  No diagnosis found.    Assessment and Plan  Attending: [name] is a 61 y.o. 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 [facility] for diffuse arthralgias and abdominal pain related to ostomy output.  He was first seen in April 2023.    The patient was referred to our clinic by General Surgery for reported significant LLQ abdominal pain near surgery site and continued high ostomy output but no other concerns after surgery.      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 His worst pain is related to rectal spasm and polyarthralgias, worst in the wrists (L > R) and bilateral hands. He did have XRAYs of the hands at [facility] in 2022 that were unremarkable.  He was started on lyrica due to initial concern for fibromyalgia and more neuropathic pain and is tolerating that well. He continues to follow with rheumatology for workup of arthritis (negative serologies, unlikely inflammatory arthritis) and is on methotrexate, previously prednisone (for UC prior to November surgery) but no longer on chronic steroids. He has done PT in the past and feels that he is as active (walking) as possible and benefits more from home exercises.     May 2024  At last visit in March, the patient returned reporting unchanged pain since last visit. He did confirm benefit since starting Tramadol 50 mg daily and denied any adverse side effects. He noted this medication allowed him to work without drowsiness or dizziness like he had with Butrans. He received an MRI Cervical Spine (03/13/22) which shows small disc protrusion at C5-6, unchanged, mild spondylotic changes in the cervical spine with no significant spinal canal stenosis or neuroforaminal narrowing, and no evidence of syringomyelia/hydromyelia. We discussed referring patient to a hand doctor for potential injections though patient defers at this time given he would have to get injections in his wrist which is his primary pain generator. We discussed the potential of using a TENS unit and patient was amenable to trialing this. We also discussed increasing quantity of Tramadol 50 mg to 30 tablets monthly so he is able to take one tablet before work each day. We refilled Lyrica 225 mg BID with no changes.     Today, ***    Urine toxicology: 07/31/21 Update due today  Urine toxicology screen appropriate  Last Opioid Change: Start butrans 10/2021, 01/2022-stop butrans, start tramadol 50 mg #20, 04/2022- Increase tramadol to #30  Opioid agreement-10/2021  Last EKG:  None  Previous Compliance Issues: None  Naloxone ordered: N/A  Total morphine equivalents: 5  Benzodiazepine: No.  Pain Psychology: New referral order placed today for both COM and pain coping skills, CBT, other treatments as appropriate  [place] DOC: None  [place] database was reviewed 07/08/22.      Peripheral neuropathy of hands   ***  - Underwent EMG, which was normal  - Not interested in scheduling biopsy for SFPN evaluation  - IFC unit ordered for neuropathic  hand pain    Polyarthritis of unknown etiology - Bilateral Wrist Pain   ***  - Continue following with Rheumatology    Chronic pain syndrome   ***  - Increase Tramadol 50 mg daily PRN to #30 tablets monthly  - Continue Lyrica 225 mg BID  - Continue home PT exercises and activity (enjoys walking)    Future Considerations:  - Right lumbar MBBs  - Consider muscle relaxant for rectal spasm    No follow-ups on file.    No orders of the defined types were placed in this encounter.    Requested Prescriptions      No prescriptions requested or ordered in this encounter     HPI:  Kevin Tran is seen in consultation at the request of [name], MD  For evaluation and recommendations regarding His chronic pain.     Patient was last seen in March. Since then, the patient has followed with GI.     Today, ***    Current Medications Regime:  Lyrica 225mg  BID  Butrans-stopped    The patient states his pain is located *** and the severity of his pain ranges from ***/10 to ***/10.  His pain currently is ***/10 and on average is ***/10. He describes the sensation of his pain as {pain described:58928}. His pain is present {pain frequency:58931} and worst {pain worst:59685}. The patient???s pain impacts {negatively affected:59709}. His interval history includes {interval history:59710}. His pain {pain changed:59711}, and he {does does not blank:47747} have new pain to discuss today. He {is/is not:36772} on blood thinners or anti-coagulants. In regards to medications currently taken for pain management, the patient {is:54246::is} tolerating these medications well and complains of associated side effects: {pain med side effects:59712}.      Previous Medication Trials: Diclofenac gel/voltaren gel, duloxetine/cymbalta (side effects), gabapentin, methocarbamol/robaxin, Naproxen/Aleve, and Pregabalin/Lyrica, compounded topical (not helpful), butrans (side effects), tramadol     Prior interventions include medications, PT.   Referral to neurology 10/2021  Pain psychology referral 01/2022    Prior imaging include MRI, XR, CT, Nerve Conduction Study.     The treatment goals include Complete resolution with medications if necessary, Ability to return to some type of employment, and Ability to return to previous daily routine and activity     Allergies as of 07/10/2022    (No Known Allergies)      Current Outpatient Medications   Medication Sig Dispense Refill    acetaminophen (TYLENOL) 500 MG tablet Take 2 tablets (1,000 mg total) by mouth every six (6) hours as needed for pain.      celecoxib (CELEBREX) 200 MG capsule Take 1 capsule (200 mg total) by mouth daily as needed for pain. 90 capsule 1    diazePAM (VALIUM) 5 MG tablet Two tablets 30 minutes prior to MRI (Patient not taking: Reported on 06/25/2022) 4 tablet 0    folic acid (FOLVITE) 1 MG tablet TAKE 1 TABLET(1 MG) BY MOUTH DAILY 90 tablet 2    loperamide (IMODIUM A-D) 2 mg tablet Take 2 tablets (4 mg total) by mouth two (2) times a day as needed for diarrhea. 60 tablet 3    mesalamine (CANASA) 1000 MG suppository Insert 1 suppository (1,000 mg total) into the rectum nightly. 30 suppository 0    multivitamin (TAB-A-VITE/THERAGRAN) per tablet Take 1 tablet by mouth daily.      omeprazole (PRILOSEC OTC) 20 MG tablet Take 1 tablet (20 mg total) by mouth daily.      pregabalin (LYRICA) 225  MG capsule Take 1 capsule (225 mg total) by mouth two (2) times a day. 60 capsule 2    traMADol (ULTRAM) 50 mg tablet Take 1 tablet (50 mg total) by mouth daily as needed for pain. 30 tablet 2    tumeric-ging-olive-oreg-capryl 100 mg-150 mg- 50 mg-150 mg cap Take by mouth.       No current facility-administered medications for this visit.     Imaging/Tests:   XR SIJ 03/22/21  FINDINGS:   Symmetric sacroiliac joints. Mild periarticular sclerosis, similar to the prior CT pelvis 03/16/2021 and likely degenerative. No erosions. Mild arthrosis at the symphysis pubis. Visualized joints are preserved.   IMPRESSION  No radiographic findings of inflammatory arthropathy.     CT Pelvis 03/16/21  FINDINGS:       BLADDER: Partially distended, otherwise unremarkable.       PELVIC/REPRODUCTIVE ORGANS: Borderline enlarged prostate, measuring up to 4.8 cm.       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 The rectal stump is unremarkable.       PERITONEUM/RETROPERITONEUM AND MESENTERY: No free air. Mild interval decrease in previously described small volume of loculated fluid in the dependent pelvis/presacral space (5:67).       LYMPH NODES: No enlarged lymph nodes.       VESSELS: Scattered calcified and noncalcified atheromatous plaques of the abdominal aorta and its branch vessels, which appear normal in caliber.       BONES AND SOFT TISSUES: Multilevel degenerative disc disease. No suspicious osseous lesions. Postsurgical changes of the anterior abdominal wall. Question tiny soft tissue tract extending to the left perianal soft tissues at approximately the 5:00 position (2:53). No drainable perianal fluid collection.   IMPRESSION  -- Sequelae of prior total colectomy with left lower quadrant end ileostomy. Mild interval decrease in previously described small volume partially loculated fluid in the dependent pelvis/presacral space.   --Tiny soft tissue tract extending through the left perianal soft tissues at approximately the 5:00 position, which may represent an old scarred down perianal fistulous tract. No drainable perirenal fluid collection.     CT Abdomen 01/21/21  FINDINGS:       LOWER CHEST: Redemonstrated right lower lobe subsegmental atelectasis versus scarring. Left-sided gravity dependent atelectasis. Otherwise unremarkable.       LIVER: Normal liver contour.  No focal liver lesions. Small volume focal fat along the falciform and gallbladder fossa, unchanged from 03/27/20.       BILIARY: No biliary ductal dilatation.  The gallbladder is physiologically distended and normal in appearance.       SPLEEN: Normal in size and contour.       PANCREAS: Normal pancreatic contour.  No focal lesions.  No ductal dilation.       ADRENAL GLANDS: Normal appearance of the adrenal glands.       KIDNEYS/URETERS: Symmetric renal enhancement.  No hydronephrosis.  No nephrolithiasis.       BLADDER: Physiologically distended and otherwise unremarkable.       REPRODUCTIVE ORGANS: Unremarkable.       GI TRACT: Postsurgical sequela of total colectomy with right lower quadrant ileostomy. Contrast opacifies the stomach and majority of the small bowel, but does not extend to the ileostomy site, likely due to contrast timing. There is also absence of contrast opacification in the right lower quadrant, although there are no abnormally dilated loops of proximal small bowel, and this is likely secondary to contrast timing. Rectal stump decompressed. No findings of acute  inflammation.  Surgically absent appendix.       PERITONEUM, RETROPERITONEUM AND MESENTERY: There is mild swirling of the mesentery in the right lower quadrant, likely post-operative. No free air.  No ascites.  Small volume of simple free fluid in the dependent pelvis, likely normal postsurgical sequela.       LYMPH NODES: No adenopathy.       VESSELS: Hepatic and portal veins are patent.  Normal caliber aorta.  Moderate scattered atherosclerotic calcifications of the abdominal aorta and its branching vessels. Accessory left renal vein arising from the inferior pole.       BONES and SOFT TISSUES: Right lower quadrant ileostomy changes and additional anterior abdominal wall postsurgical changes. No focal rim-enhancing fluid collection. No aggressive osseous lesions.  No focal soft tissue lesions. Multilevel degenerative changes of the visualized spine, most severe at L5-S1 with loss of intervertebral disc space and vacuum disc phenomenon.   IMPRESSION  Sequela of interval total colectomy with right lower quadrant ileostomy. Loculated fluid is identified in the presacral region, however, there is no evidence of discrete wall enhancement to suggest abscess formation at this time.       Additional chronic and incidental findings as above.     XRAY hand results in care everywhere 10/2020    Lab Results   Component Value Date    CREATININE 1.1 03/16/2021     Lab Results   Component Value Date    ALKPHOS 67 11/24/2020    BILITOT 0.7 11/24/2020    BILIDIR 0.10 04/30/2018    PROT 7.5 11/24/2020 ALBUMIN 4.1 11/24/2020    ALT 36 09/10/2021    AST 21 09/10/2021     Lab Results   Component Value Date    PLT 265 09/10/2021     Review of Systems:  General {ajlrosgen:46920}  Cardiovascular {aggROSCV:37303}  Gastrointestinal {aggROSGI:37304}  Skin {aggROSskin:37305}  Endocrine {aggROSendo:37306}  Musculoskeletal {aggROSmusk:37307}  Neurologic {aggROSneu:37308}  Psychiatric {aggROSpsych:37309}    He denies any homicidal or suicidal ideation.     OBJECTIVE  PHYSICAL EXAM:  There were no vitals taken for this visit.  Wt Readings from Last 3 Encounters:   06/25/22 76.2 kg (168 lb)   04/16/22 78.5 kg (173 lb)   03/05/22 78.6 kg (173 lb 4.8 oz)     GENERAL: Well developed, well-nourished {loboncobese:42394} and is in {BLANK ZOXWRU:04540} distress. The patient is pleasant and interactive. Patient is a good historian.  No evidence of sedation or intoxication.    HEENT: Normocephalic/atraumatic.   CARDIOVASCULAR:  {ajlcardiacexam:61906}  RESPIRATORY:  {ajlpulmexam:61907::Normal work of breathing, no supplemental 02}  EXTREMITIES: No clubbing, cyanosis noted.  NEUROLOGIC:  Alert and oriented, speech fluent, normal language. Cranial nerves grossly intact.  Sensation {ajlneuroexam:62877}. Reflexes were {ajlreflexes:69631} and {were/were not:54765} equal in upper and lower extremities  MUSCULOSKELETAL:  Motor function  {Pain Motor Numbers:[phone]::5/5} in {ajlupperlower:48905::upper,lower} extremities.  Patient rises from a seated position with {ajlambulate3:46823::no} difficulty.  The patient was able to ambulate with {ajlambulate3:46823::no} difficulty throughout the clinic today {With/without:5700} the assistance of a walking aid-{ajlcane:46931::None}.   Paraspinal tenderness to palpation {present/not present:25705} in the {Cervical Thoracic Lumbar:47568} spine.   Muscles {WERE / WERE NOT:19253} in spasm/tight.  SKIN: No obvious rashes, lesions, or erythema.  PSY: Appropriate, full range affect, no psychomotor retardation

## 2022-07-10 ENCOUNTER — Ambulatory Visit: Admit: 2022-07-10 | Discharge: 2022-07-11 | Payer: PRIVATE HEALTH INSURANCE

## 2022-07-10 MED ORDER — TRAMADOL 50 MG TABLET
ORAL_TABLET | Freq: Every day | ORAL | 2 refills | 30 days | Status: CP | PRN
Start: 2022-07-10 — End: ?

## 2022-07-10 MED ORDER — PREGABALIN 225 MG CAPSULE
ORAL_CAPSULE | Freq: Two times a day (BID) | ORAL | 2 refills | 30 days | Status: CP
Start: 2022-07-10 — End: ?

## 2022-07-10 NOTE — Unmapped (Signed)
Medication:TRAMADOL 50 MG  YNW:GNFA 1 DAILY PRN FOR PAIN   Quantity on RX: #30  Filled on:07/05/2022  Pill count today: #26

## 2022-07-10 NOTE — Unmapped (Signed)
It was good to see you today.    I have refilled your medications with no changes.    We will see you in 3 months, or sooner if needed.

## 2022-07-10 NOTE — Unmapped (Signed)
Department of Anesthesiology  [facility]  [address]  [place], [name] [address]  [phone]    Chronic Pain Follow Up Note  1. Chronic pain syndrome    2. Multiple joint pain    3. Pain medication agreement signed    4. Bilateral hand pain    5. Idiopathic peripheral neuropathy        Assessment and Plan  Attending: Hristos Bubier is a 62 y.o. 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 [facility] diffuse arthralgias and abdominal pain related to ostomy output.  He was first seen in April 2023.    The patient was referred to our clinic by General Surgery for reported significant LLQ abdominal pain near surgery site and continued high ostomy output but no other concerns after surgery.      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 His worst pain is related to rectal spasm and polyarthralgias, worst in the wrists (L > R) and bilateral hands. He did have XRAYs of the hands at [facility] in 2022 that were unremarkable.  He was started on lyrica due to initial concern for fibromyalgia and more neuropathic pain and is tolerating that well. He continues to follow with rheumatology for workup of arthritis (negative serologies, unlikely inflammatory arthritis) and is on methotrexate, previously prednisone (for UC prior to November surgery) but no longer on chronic steroids. He has done PT in the past and feels that he is as active (walking) as possible and benefits more from home exercises.     May 2024  At last visit in March, the patient returned reporting unchanged pain since last visit. He did confirm benefit since starting Tramadol 50 mg daily and denied any adverse side effects. He noted this medication allowed him to work without drowsiness or dizziness like he had with Butrans. He received an MRI Cervical Spine (03/13/22) which shows small disc protrusion at C5-6, unchanged, mild spondylotic changes in the cervical spine with no significant spinal canal stenosis or neuroforaminal narrowing, and no evidence of syringomyelia/hydromyelia. We discussed referring patient to a hand doctor for potential injections though patient defers at this time given he would have to get injections in his wrist which is his primary pain generator. We discussed the potential of using a TENS unit and patient was amenable to trialing this. We also discussed increasing quantity of Tramadol 50 mg to 30 tablets monthly so he is able to take one tablet before work each day. We refilled Lyrica 225 mg BID with no changes.     Today, the patient presents reporting overall unchanged pain since last visit and presents primarily for medication refills. Patient denies any new or worsening symptoms. He is currently managed on Tramadol 50 mg daily prn, Celebrex 200 mg prn, and Lyrica 225 mg BID with benefit. Patient denies any adverse side effects on his current medication regimen. We will plan to update his UDS/treatment agreement today and have regular 3 month follow-up. He is following with GI who prescribed him a suppository and Imodium.     Urine toxicology: 07/31/21 Update due  today  Urine toxicology screen appropriate  Treatment agreement: 10/29/21 Updated today   Last Opioid Change: Start butrans 10/2021, 01/2022-stop butrans, start tramadol 50 mg #20, 04/2022- Increase tramadol to #30  Opioid agreement-10/2021  Last EKG:  None  Previous Compliance Issues: None  Naloxone ordered: N/A  Total morphine equivalents: 5  Benzodiazepine: No.  Pain Psychology: New referral order placed today for both COM and pain coping skills, CBT, other treatments as appropriate  [place] DOC: None  [place] database was reviewed 07/10/22.      Peripheral neuropathy of hands   - Underwent EMG, which was normal  - Not interested in scheduling biopsy for SFPN evaluation  - IFC unit ordered for neuropathic hand pain at previous visit    Polyarthritis of unknown etiology - Bilateral Wrist Pain   - Continue following with Rheumatology    Chronic pain syndrome; Pain medication agreement signed   - Continue Tramadol 50 mg daily PRN #30 tablets monthly; refilled   - Continue Lyrica 225 mg BID; refilled   - Continue home PT exercises and activity (enjoys walking)  - Update UDS/treatment agreement today     Future Considerations:  - Right lumbar MBBs  - Consider muscle relaxant for rectal spasm    Return in about 3 months (around 10/10/2022).    Orders Placed This Encounter   Procedures    Drug Screen,  Pain Clinic, Urine     Order Specific Question:   Patient's Current Medications     Answer:   Not provided     Order Specific Question:   Release to patient     Answer:   Immediate [1]    Misc nursing order (specify)     Please obtain treatment agreement     Requested Prescriptions     Signed Prescriptions Disp Refills    pregabalin (LYRICA) 225 MG capsule 60 capsule 2     Sig: Take 1 capsule (225 mg total) by mouth two (2) times a day.    traMADol (ULTRAM) 50 mg tablet 30 tablet 2     Sig: Take 1 tablet (50 mg total) by mouth daily as needed for pain.     HPI:  Mr. Edsell is seen in consultation at the request of [name], MD  For evaluation and recommendations regarding His chronic pain.     Patient was last seen in March. Since then, the patient has followed with GI.     Today, the patient presents reporting overall unchanged pain since last visit. He notes following with GI in regards to GI related pain and notes being prescribed a suppository in which he has not used often. The patient denies any new or worsening symptoms at this time.     In terms of medications, the patient reports taking Celebrex 200 mg prn, Lyrica 225 mg BID, and Tramadol 50 mg prn with benefit. The patient states he does not take Celebrex daily. He denies any adverse side effects on his current medication regimen. He reports benefit from Imodium.     Current Medications Regime:  Lyrica 225 mg BID  Celebrex 200 mg prn  Tramadol 50 mg daily prn    The patient states his pain is located in bilateral wrists and bilateral shoulders and the severity of his pain ranges from 4/10 to 4/10.  His pain currently is 4/10 and on average is 5/10. He describes the sensation of his pain as aching, burning, sharp, shooting. His pain is present all of the time  and worst mornings. The patient???s pain impacts enjoyment of life, normal work, sleep. His interval history includes None. His pain has stayed the same, and he does not have new pain to discuss today. He is not on blood thinners or anti-coagulants. In regards to medications currently taken for pain management, the patient is tolerating these medications well and complains of associated side effects: dry mouth.    Previous Medication Trials: Diclofenac gel/voltaren gel, duloxetine/cymbalta (side effects), gabapentin, methocarbamol/robaxin, Naproxen/Aleve, and Pregabalin/Lyrica, compounded topical (not helpful), butrans (side effects), tramadol     Prior interventions include medications, PT.   Referral to neurology 10/2021  Pain psychology referral 01/2022    Prior imaging include MRI, XR, CT, Nerve Conduction Study.     The treatment goals include Complete resolution with medications if necessary, Ability to return to some type of employment, and Ability to return to previous daily routine and activity     Allergies as of 07/10/2022    (No Known Allergies)      Current Outpatient Medications   Medication Sig Dispense Refill    acetaminophen (TYLENOL) 500 MG tablet Take 2 tablets (1,000 mg total) by mouth every six (6) hours as needed for pain. celecoxib (CELEBREX) 200 MG capsule Take 1 capsule (200 mg total) by mouth daily as needed for pain. 90 capsule 1    diazePAM (VALIUM) 5 MG tablet Two tablets 30 minutes prior to MRI 4 tablet 0    folic acid (FOLVITE) 1 MG tablet TAKE 1 TABLET(1 MG) BY MOUTH DAILY 90 tablet 2    loperamide (IMODIUM A-D) 2 mg tablet Take 2 tablets (4 mg total) by mouth two (2) times a day as needed for diarrhea. 60 tablet 3    mesalamine (CANASA) 1000 MG suppository Insert 1 suppository (1,000 mg total) into the rectum nightly. 30 suppository 0    multivitamin (TAB-A-VITE/THERAGRAN) per tablet Take 1 tablet by mouth daily.      omeprazole (PRILOSEC OTC) 20 MG tablet Take 1 tablet (20 mg total) by mouth daily.      tumeric-ging-olive-oreg-capryl 100 mg-150 mg- 50 mg-150 mg cap Take by mouth.      pregabalin (LYRICA) 225 MG capsule Take 1 capsule (225 mg total) by mouth two (2) times a day. 60 capsule 2    traMADol (ULTRAM) 50 mg tablet Take 1 tablet (50 mg total) by mouth daily as needed for pain. 30 tablet 2     No current facility-administered medications for this visit.     Imaging/Tests:   XR SIJ 03/22/21  FINDINGS:   Symmetric sacroiliac joints. Mild periarticular sclerosis, similar to the prior CT pelvis 03/16/2021 and likely degenerative. No erosions. Mild arthrosis at the symphysis pubis. Visualized joints are preserved.   IMPRESSION  No radiographic findings of inflammatory arthropathy.     CT Pelvis 03/16/21  FINDINGS:       BLADDER: Partially distended, otherwise unremarkable.       PELVIC/REPRODUCTIVE ORGANS: Borderline enlarged prostate, measuring up to 4.8 cm.       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 The rectal stump is unremarkable.       PERITONEUM/RETROPERITONEUM AND MESENTERY: No free air. Mild interval decrease in previously described small volume of loculated fluid in the dependent pelvis/presacral space (5:67).       LYMPH NODES: No enlarged lymph nodes.       VESSELS: Scattered calcified and noncalcified atheromatous plaques of the abdominal aorta and its branch vessels, which appear  normal in caliber.       BONES AND SOFT TISSUES: Multilevel degenerative disc disease. No suspicious osseous lesions. Postsurgical changes of the anterior abdominal wall. Question tiny soft tissue tract extending to the left perianal soft tissues at approximately the 5:00 position (2:53). No drainable perianal fluid collection.   IMPRESSION  -- Sequelae of prior total colectomy with left lower quadrant end ileostomy. Mild interval decrease in previously described small volume partially loculated fluid in the dependent pelvis/presacral space.   --Tiny soft tissue tract extending through the left perianal soft tissues at approximately the 5:00 position, which may represent an old scarred down perianal fistulous tract. No drainable perirenal fluid collection.     CT Abdomen 01/21/21  FINDINGS:       LOWER CHEST: Redemonstrated right lower lobe subsegmental atelectasis versus scarring. Left-sided gravity dependent atelectasis. Otherwise unremarkable.       LIVER: Normal liver contour.  No focal liver lesions. Small volume focal fat along the falciform and gallbladder fossa, unchanged from 03/27/20.       BILIARY: No biliary ductal dilatation.  The gallbladder is physiologically distended and normal in appearance.       SPLEEN: Normal in size and contour.       PANCREAS: Normal pancreatic contour.  No focal lesions.  No ductal dilation.       ADRENAL GLANDS: Normal appearance of the adrenal glands.       KIDNEYS/URETERS: Symmetric renal enhancement.  No hydronephrosis.  No nephrolithiasis.       BLADDER: Physiologically distended and otherwise unremarkable.       REPRODUCTIVE ORGANS: Unremarkable.       GI TRACT: Postsurgical sequela of total colectomy with right lower quadrant ileostomy. Contrast opacifies the stomach and majority of the small bowel, but does not extend to the ileostomy site, likely due to contrast timing. There is also absence of contrast opacification in the right lower quadrant, although there are no abnormally dilated loops of proximal small bowel, and this is likely secondary to contrast timing. Rectal stump decompressed. No findings of acute inflammation.  Surgically absent appendix.       PERITONEUM, RETROPERITONEUM AND MESENTERY: There is mild swirling of the mesentery in the right lower quadrant, likely post-operative. No free air.  No ascites.  Small volume of simple free fluid in the dependent pelvis, likely normal postsurgical sequela.       LYMPH NODES: No adenopathy.       VESSELS: Hepatic and portal veins are patent.  Normal caliber aorta.  Moderate scattered atherosclerotic calcifications of the abdominal aorta and its branching vessels. Accessory left renal vein arising from the inferior pole.       BONES and SOFT TISSUES: Right lower quadrant ileostomy changes and additional anterior abdominal wall postsurgical changes. No focal rim-enhancing fluid collection. No aggressive osseous lesions.  No focal soft tissue lesions. Multilevel degenerative changes of the visualized spine, most severe at L5-S1 with loss of intervertebral disc space and vacuum disc phenomenon.   IMPRESSION  Sequela of interval total colectomy with right lower quadrant ileostomy. Loculated fluid is identified in the presacral region, however, there is no evidence of discrete wall enhancement to suggest abscess formation at this time.       Additional chronic and incidental findings as above.     Review of Systems:  General none  Cardiovascular none  Gastrointestinal none  Skin none  Endocrine none  Musculoskeletal back pain  Neurologic migraines/headaches  Psychiatric none    He  denies any homicidal or suicidal ideation.     OBJECTIVE  PHYSICAL EXAM:  BP 122/76  - Pulse 62  - Temp 36.3 ??C (97.4 ??F)  - Ht 172.7 cm (5' 8)  - Wt 76.7 kg (169 lb 1.6 oz)  - SpO2 97%  - BMI 25.71 kg/m??   Wt Readings from Last 3 Encounters:   07/10/22 76.7 kg (169 lb 1.6 oz)   06/25/22 76.2 kg (168 lb)   04/16/22 78.5 kg (173 lb)     GENERAL:  The patient is well developed, well-nourished, and appears to be in no apparent distress.   HEAD/NECK:    Normocephalic/atraumatic. clear sclera, pupils not pinpoint  CV:  Warm and well perfused   LUNGS:   Normal work of breathing, no supplemental O2  EXTREMITIES:  No clubbing, cyanosis noted.  NEUROLOGIC:    The patient is alert and oriented, speech fluent, normal language.   MUSCULOSKELETAL:    Motor function  preserved.   GAIT:  The patient rises from a seated position with no difficulty and ambulates with nonantalgic gait without the assistance of a walking aid.   SKIN:   No obvious rashes, lesions, or erythema.  PSY:   Appropriate affect. No overt pain behaviors. No evidence of psychomotor retardation or agitation, no signs of intoxication.     We are delivering comprehensive, continuous, longitudinal care for this patient with chronic pain.   Jul 10, 2022 12:25 PM. Documentation assistance provided by the Scribe. I was present during the time the encounter was recorded. The information recorded by the Scribe was done at my direction and has been reviewed and validated by me. Rhina Brackett, FNP-C      Medical Decision Making    Problems Addressed:  2 or more stable chronic illnesses (moderate)    Amount/Complexity:  Reviewed external records: [place] PDMP (today on day of visit)  Reviewed results: None  Ordered tests: UDS  Assessment requiring independent historian: None  Discussion of management/test results with medical professional: None  Independent interpretation of test: None    Risk  Moderate: Prescription Drug Management, Minor surgery w/ risk factors, Elective Major Surgery w/o risk factors, Diagnosis/treatment significantly limited by social determinants of health

## 2022-07-10 NOTE — Unmapped (Signed)
Addended by: Rhina Brackett on: 07/10/2022 12:27 PM     Modules accepted: Level of Service

## 2022-07-11 NOTE — Unmapped (Signed)
Denied    Note from payer: Canceled. Please note this request does not require prior authorization review. Paid claim on file on 07/05/22. Please contact pharmacy to process prescription. Thank you!  Payer: [email or web address] Case ID: [id number]  Electronic appeal: Supported  View History  Medication Being Authorized    traMADol (ULTRAM) 50 mg tablet  Take 1 tablet (50 mg total) by mouth daily as needed for pain.  Dispense: 30 tablet Refills: 2   Start: 07/10/2022   Class: Normal   This order has been released to its destination.  To be filled at: [id number] - [place], [place] - [address] OF [address]

## 2022-07-12 NOTE — Unmapped (Signed)
Per Jolene Provost, pt needs Nu-Hope hernia support belt - [phone]- A orders sent to [facility]. Orders faxed to [phone].

## 2022-07-17 LAB — DRUG SCREEN, PAIN CLINIC, URINE
2-HYDROXY ETHYL FLURAZEPAM, UR: NOT DETECTED ng/mL
6-MONOACETYLMORPHINE, UR: NOT DETECTED ng/mL
7-AMINOCLONAZEPAM, UR: NOT DETECTED ng/mL
7-AMINOFLUNITRAZEPAM, UR: NOT DETECTED ng/mL
ALPHA-HYDROXY TRIAZOLAM, UR: NOT DETECTED ng/mL
ALPHA-HYDROXYALPRAZOLAM GLUCURONIDE, UR: NOT DETECTED ng/mL
ALPHA-HYDROXYALPRAZOLAM, UR: NOT DETECTED ng/mL
ALPRAZOLAM, UR: NOT DETECTED ng/mL
AMPHETAMINE, UR: NOT DETECTED ng/mL
BUPRENORPHINE, UR: NOT DETECTED ng/mL
CHLORDIAZEPOXIDE, UR: NOT DETECTED ng/mL
CLOBAZAM, UR: NOT DETECTED ng/mL
CLONAZEPAM, UR: NOT DETECTED ng/mL
COCAINE METABOLITES URINE: NEGATIVE ng/mL
CODEINE, UR: NOT DETECTED ng/mL
CODEINE-6-BETA-GLUCURONIDE, UR: NOT DETECTED ng/mL
COMMENT,URINE: NORMAL
CREATININE-O-ADULT: 116.4 mg/dL
DIAZEPAM, UR: NOT DETECTED ng/mL
DIHYDROCODEINE, UR: NOT DETECTED ng/mL
EDDP, UR: NOT DETECTED ng/mL
EPHEDRINE, UR: NOT DETECTED ng/mL
FLUNITRAZEPAM, UR: NOT DETECTED ng/mL
FLURAZEPAM, UR: NOT DETECTED ng/mL
HYDROCODONE, UR: NOT DETECTED ng/mL
HYDROMORPHONE, UR: NOT DETECTED ng/mL
HYDROMORPHONE-3-BETA-GLUCURONIDE, UR: NOT DETECTED ng/mL
LORAZEPAM GLUCURONIDE, UR: NOT DETECTED ng/mL
LORAZEPAM, UR: NOT DETECTED ng/mL
MDA, UR: NOT DETECTED ng/mL
MDEA, UR: NOT DETECTED ng/mL
MDMA, UR: NOT DETECTED ng/mL
MEPERIDINE, UR: NOT DETECTED ng/mL
METHADONE, UR: NOT DETECTED ng/mL
METHAMPHETAMINE, UR: NOT DETECTED ng/mL
METHYLPHENIDATE, UR: NOT DETECTED ng/mL
MIDAZOLAM, UR: NOT DETECTED ng/mL
MORPHINE, UR: NOT DETECTED ng/mL
MORPHINE-6-BETA-GLUCURONIDE, UR: NOT DETECTED ng/mL
N-DESMETHYLCLOBAZAM, UR: NOT DETECTED ng/mL
NALOXONE, UR: NOT DETECTED ng/mL
NORBUPRENOORPHINE GLUCURONIDE, UR: NOT DETECTED ng/mL
NORBUPRENORPHINE, UR: NOT DETECTED ng/mL
NORDIAZEPAM, UR: NOT DETECTED ng/mL
NORFENTANYL, UR: NOT DETECTED ng/mL
NORHYDROCODONE, UR: NOT DETECTED ng/mL
NORMEPERIDINE, UR: NOT DETECTED ng/mL
NOROXYCODONE, UR: NOT DETECTED ng/mL
NOROXYMORPHONE, UR: NOT DETECTED ng/mL
NORPROPOXYPHENE, UR: NOT DETECTED ng/mL — AB
OXAZEPAM GLUCURONIDE, UR: NOT DETECTED ng/mL
OXAZEPAM, UR: NOT DETECTED ng/mL
OXIDANTS-O-ADULT: NEGATIVE
OXYCODONE, UR: NOT DETECTED ng/mL
OXYMORPHONE, UR: NOT DETECTED ng/mL
PH-O-ADULT: 5.3
PHENCYCLIDINE, UR: NOT DETECTED ng/mL
PHENTERMINE, UR: NOT DETECTED ng/mL
PRAZEPAM, UR: NOT DETECTED ng/mL
PROPOXYPHENE, UR: NOT DETECTED ng/mL
PSEUDOEPHEDRINE, UR: NOT DETECTED ng/mL
RITALINIC ACID, UR: NOT DETECTED ng/mL
SPECIFIC GRAVITY-O-ADULT: 1.02
TAPENTADOL, UR: NOT DETECTED ng/mL
TEMAZEPAM GLUCURONIDE, UR: NOT DETECTED ng/mL
TEMAZEPAM, UR: NOT DETECTED ng/mL
THC, SCREEN URINE: NEGATIVE ng/mL
TRIAZOLAM, UR: NOT DETECTED ng/mL
URINE BARBITURATES MAYO: NEGATIVE ng/mL
ZOLPIDEM PHENYL-4-CARBOXYLIC ACID, UR: NOT DETECTED ng/mL
ZOLPIDEM, UR: NOT DETECTED ng/mL

## 2022-10-08 ENCOUNTER — Ambulatory Visit
Admit: 2022-10-08 | Discharge: 2022-10-09 | Payer: PRIVATE HEALTH INSURANCE | Attending: Anesthesiology | Primary: Anesthesiology

## 2022-10-08 DIAGNOSIS — G894 Chronic pain syndrome: Principal | ICD-10-CM

## 2022-10-08 DIAGNOSIS — M2550 Pain in unspecified joint: Principal | ICD-10-CM

## 2022-10-08 MED ORDER — PREGABALIN 225 MG CAPSULE
ORAL_CAPSULE | Freq: Two times a day (BID) | ORAL | 2 refills | 30 days | Status: CP
Start: 2022-10-08 — End: ?

## 2022-10-08 NOTE — Unmapped (Addendum)
We have refilled your medications. We recommend applying Voltaren gel or Diclofenac gel to the left elbow up to 4 times a day and if it does not get better we can follow up with a possible injection. You can also alternate with lidocaine cream to the left elbow.   For the hands you can use a paraffin bath in the morning. You can purchase off [facility] or [facility].

## 2022-10-08 NOTE — Unmapped (Signed)
Medication:traMADol (ULTRAM) 50 mg tablet   UJW:[id number] 1 tablet (50 mg total) by mouth daily as needed for pain   Quantity on RX: #30  Filled on:09/14/22  Pill count today: # 8

## 2022-10-08 NOTE — Unmapped (Addendum)
Department of Anesthesiology  [facility]  [address]  [place], [name] [address]  [phone]    Chronic Pain Follow Up Note  1. Chronic pain syndrome    2. Multiple joint pain        Assessment and Plan  Attending: [name] is a 62 y.o. 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 [facility] for diffuse arthralgias and abdominal pain related to ostomy output.  He was first seen in April 2023.    The patient was referred to our clinic by General Surgery for reported significant LLQ abdominal pain near surgery site and continued high ostomy output but no other concerns after surgery.      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 His worst pain is related to rectal spasm and polyarthralgias, worst in the wrists (L > R) and bilateral hands. [facility] that were un[facility]able.  He was started on lyrica due to initial concern for fibromyalgia and more neuropathic pain and is tolerating that well. He continues to follow with rheumatology for workup of arthritis (negative serologies, unlikely inflammatory arthritis) and is on methotrexate, previously prednisone (for UC prior to November surgery) but no longer on chronic steroids. He has done PT in the past and feels that he is as active (walking) as possible and benefits more from home exercises.     August 2024  At last visit in May, the patient presented reporting overall unchanged pain since last visit and presented primarily for medication refills. Patient denied any new or worsening symptoms. He was currently managed on Tramadol 50 mg daily prn, Celebrex 200 mg prn, and Lyrica 225 mg BID with benefit. Patient unable to tolerate Celexrex due to side effect so this was discontinued. He was following with GI who prescribed him a suppository and Imodium.     Today, the patient presents reporting overall unchanged pain since last visit and presented primarily for medication refills. He denies any new or worsening symptoms. He continues to have polyarthralgia that significantly limits his activity levels. He reports he fell about a month ago in which he hurt his left arm though has not followed with Urgent Care or PCP after the fall.  The fall sounds significant.  Luckily he has good ROM at the elbow, but does have pain to palpation.  He continues to take Lyrica 225 mg every day and Tramadol 50 mg every day with no appreciable benefit. We encouraged patient to follow with PCP in regards to elbow pain and swelling along with using Voltaren gel for further pain relief. We can consider new xrays of hands if pain continues to worsen, given previous erosion seen on XRAY and I do not see any specific follow up documentation from the provider that ordered the scan to make it obvious if that is from degenerative causes vs inflammatory. He has not seen rheumatology in over 1 year.  We discussed we could consider an injection for his elbow if his pain persists (lateral epicondyle injection).  He notes poor GI absorption of  medications, so while he would like to work on better pain control, he realizes options are limited.  He didn't tolerate butrans unfortunately.        Urine toxicology: 07/10/22   Urine toxicology screen appropriate  Treatment agreement: 07/10/22  Last Opioid Change: Start butrans 10/2021, 01/2022-stop butrans, start tramadol 50 mg #20, 04/2022- Increase tramadol to #30  Opioid agreement-10/2021  Last EKG:  None  Previous Compliance Issues: None  Naloxone ordered: N/A  Total morphine equivalents: 5  Benzodiazepine: No.  Pain Psychology: Consider referral in the future  [place] DOC: None  [place] database was reviewed 10/08/22.      Peripheral neuropathy of hands   - Underwent EMG, which was normal  - Not interested in scheduling biopsy for SFPN evaluation  - IFC unit ordered for neuropathic hand pain at previous visit      Polyarthritis of unknown etiology - Bilateral Wrist Pain   -Chronic, not at goal  - Continue following with Rheumatology, has not seen in some time  - can consider repeat xrays   - can trial OTC paraffin bath for the hands     Chronic pain syndrome; Pain medication agreement signed   - Continue Tramadol 50 mg daily PRN #30 tablets monthly; refilled   - Continue Lyrica 225 mg BID; refilled   - Continue home PT exercises and activity (enjoys walking)  - UDS/treatment agreement are up to date     Left Elbow Pain   - Follow with PCP  - Use OTC Voltaren gel along with switching with Lidocaine cream  - can consider injections if pain does not improve by the next visit    Future Considerations:  - Right lumbar MBBs  - Consider muscle relaxant for rectal spasm  -Other medications that dont rely on full GI absorption    Return in about 3 months (around 01/08/2023).    No orders of the defined types were placed in this encounter.    Requested Prescriptions     Signed Prescriptions Disp Refills    traMADol (ULTRAM) 50 mg tablet 30 tablet 2     Sig: Take 1 tablet (50 mg total) by mouth daily as needed for pain.    pregabalin (LYRICA) 225 MG capsule 60 capsule 2     Sig: Take 1 capsule (225 mg total) by mouth two (2) times a day.     HPI:  Kevin Tran is seen in consultation at the request of Kevin Fraise, MD  For evaluation and recommendations regarding His chronic pain.     Patient was last seen in May.     Today, the patient returns reporting overall unchanged pain and presents primarily for medication refills. He notes the medications go straight through him given his ostomy bag. He notes continuing pain in his hands, wrists, and shoulders. He notes Rheumatology informed his that the arthritis he is has is unable to detect. He notes he recently fell about a month ago in which he felt like he cracked a bone in his left arm. He denies following with Urgent Care after his fall.     In terms of medications, the patient reports minimal benefit since starting Tramadol 50 mg every day prn describing the benefits as the same as Tylenol. He continues to take Lyrica 225 mg BID. He denies taking Valium. He discontinued Celebrex after increased dizziness in which he was switched to Tramadol. He also reports managing pain with Tylenol.     Current Medications Regime:  Lyrica 225 mg BID  Tramadol 50 mg daily prn    The patient states his pain is located neck, bilateral shoulders, wrists, and elbows and the severity of his pain ranges from 6/10 to 6/10.  His pain currently is 6/10 and on average is 6/10. He describes the sensation of his pain as aching, sharp. His pain is present all of the time and worst mornings. The patient???s pain impacts enjoyment of life, mood, normal work, sleep. His interval history includes None. His pain has stayed the same, and he does not have new pain to discuss today. He is not on blood thinners or anti-coagulants. In regards to medications currently taken for pain management, the patient is tolerating these medications well and complains of associated side effects: dry mouth.    Previous Medication Trials: Diclofenac gel/voltaren gel, duloxetine/cymbalta (side effects), gabapentin, methocarbamol/robaxin, Naproxen/Aleve, and Pregabalin/Lyrica, compounded topical (not helpful), butrans (side effects), tramadol     Prior interventions include medications, PT.   Referral to neurology 10/2021  Pain psychology referral 01/2022    Prior imaging include MRI, XR, CT, Nerve Conduction Study.     The treatment goals include Complete resolution with medications if necessary, Ability to return to some type of employment, and Ability to return to previous daily routine and activity     Allergies as of 10/08/2022    (No Known Allergies)      Current Outpatient Medications   Medication Sig Dispense Refill    acetaminophen (TYLENOL) 500 MG tablet Take 2 tablets (1,000 mg total) by mouth every six (6) hours as needed for pain.      folic acid (FOLVITE) 1 MG tablet TAKE 1 TABLET(1 MG) BY MOUTH DAILY 90 tablet 2    loperamide (IMODIUM A-D) 2 mg tablet Take 2 tablets (4 mg total) by mouth two (2) times a day as needed for diarrhea. 60 tablet 3    mesalamine (CANASA) 1000 MG suppository Insert 1 suppository (1,000 mg total) into the rectum nightly. 30 suppository 0    multivitamin (TAB-A-VITE/THERAGRAN) per tablet Take 1 tablet by mouth daily.      omeprazole (PRILOSEC OTC) 20 MG tablet Take 1 tablet (20 mg total) by mouth daily.      tumeric-ging-olive-oreg-capryl 100 mg-150 mg- 50 mg-150 mg cap Take by mouth.      pregabalin (LYRICA) 225 MG capsule Take 1 capsule (225 mg total) by mouth two (2) times a day. 60 capsule 2    [START ON 10/15/2022] traMADol (ULTRAM) 50 mg tablet Take 1 tablet (50 mg total) by mouth daily as needed for pain. 30 tablet 2     No current facility-administered medications for this visit.     Imaging/Tests:   XR SIJ 03/22/21  FINDINGS:   Symmetric sacroiliac joints. Mild periarticular sclerosis, similar to the prior CT pelvis 03/16/2021 and likely degenerative. No erosions. Mild arthrosis at the symphysis pubis. Visualized joints are preserved.   IMPRESSION  No radiographic findings of inflammatory arthropathy.     CT Pelvis 03/16/21  FINDINGS:       BLADDER: Partially distended, otherwise unremarkable.       PELVIC/REPRODUCTIVE ORGANS: Borderline enlarged prostate, measuring up to 4.8 cm.       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 The rectal stump is unremarkable.       PERITONEUM/RETROPERITONEUM AND MESENTERY: No free air. Mild interval decrease in previously described small volume of loculated fluid in the dependent pelvis/presacral space (5:67).  LYMPH NODES: No enlarged lymph nodes.       VESSELS: Scattered calcified and noncalcified atheromatous plaques of the abdominal aorta and its branch vessels, which appear normal in caliber.       BONES AND SOFT TISSUES: Multilevel degenerative disc disease. No suspicious osseous lesions. Postsurgical changes of the anterior abdominal wall. Question tiny soft tissue tract extending to the left perianal soft tissues at approximately the 5:00 position (2:53). No drainable perianal fluid collection.   IMPRESSION  -- Sequelae of prior total colectomy with left lower quadrant end ileostomy. Mild interval decrease in previously described small volume partially loculated fluid in the dependent pelvis/presacral space.   --Tiny soft tissue tract extending through the left perianal soft tissues at approximately the 5:00 position, which may represent an old scarred down perianal fistulous tract. No drainable perirenal fluid collection.     CT Abdomen 01/21/21  FINDINGS:       LOWER CHEST: Redemonstrated right lower lobe subsegmental atelectasis versus scarring. Left-sided gravity dependent atelectasis. Otherwise unremarkable.       LIVER: Normal liver contour.  No focal liver lesions. Small volume focal fat along the falciform and gallbladder fossa, unchanged from 03/27/20.       BILIARY: No biliary ductal dilatation.  The gallbladder is physiologically distended and normal in appearance.       SPLEEN: Normal in size and contour.       PANCREAS: Normal pancreatic contour.  No focal lesions.  No ductal dilation.       ADRENAL GLANDS: Normal appearance of the adrenal glands.       KIDNEYS/URETERS: Symmetric renal enhancement.  No hydronephrosis.  No nephrolithiasis.       BLADDER: Physiologically distended and otherwise unremarkable.       REPRODUCTIVE ORGANS: Unremarkable.       GI TRACT: Postsurgical sequela of total colectomy with right lower quadrant ileostomy. Contrast opacifies the stomach and majority of the small bowel, but does not extend to the ileostomy site, likely due to contrast timing. There is also absence of contrast opacification in the right lower quadrant, although there are no abnormally dilated loops of proximal small bowel, and this is likely secondary to contrast timing. Rectal stump decompressed. No findings of acute inflammation.  Surgically absent appendix.       PERITONEUM, RETROPERITONEUM AND MESENTERY: There is mild swirling of the mesentery in the right lower quadrant, likely post-operative. No free air.  No ascites.  Small volume of simple free fluid in the dependent pelvis, likely normal postsurgical sequela.       LYMPH NODES: No adenopathy.       VESSELS: Hepatic and portal veins are patent.  Normal caliber aorta.  Moderate scattered atherosclerotic calcifications of the abdominal aorta and its branching vessels. Accessory left renal vein arising from the inferior pole.       BONES and SOFT TISSUES: Right lower quadrant ileostomy changes and additional anterior abdominal wall postsurgical changes. No focal rim-enhancing fluid collection. No aggressive osseous lesions.  No focal soft tissue lesions. Multilevel degenerative changes of the visualized spine, most severe at L5-S1 with loss of intervertebral disc space and vacuum disc phenomenon.   IMPRESSION  Sequela of interval total colectomy with right lower quadrant ileostomy. Loculated fluid is identified in the presacral region, however, there is no evidence of discrete wall enhancement to suggest abscess formation at this time.       Additional chronic and incidental findings as above.     Review of Systems:  General difficulty sleeping  Cardiovascular none  Gastrointestinal none  Skin none  Endocrine hot flashes  Musculoskeletal muscle aches/weakness  Neurologic none  Psychiatric low energy    He denies any homicidal or suicidal ideation. OBJECTIVE  PHYSICAL EXAM:  BP 126/84  - Pulse 69  - Temp 36.1 ??C (97 ??F) (Skin)  - Resp 16  - Ht 172.7 cm (5' 8)  - Wt 75.9 kg (167 lb 6.4 oz)  - SpO2 98%  - BMI 25.45 kg/m??   Wt Readings from Last 3 Encounters:   10/08/22 75.9 kg (167 lb 6.4 oz)   07/10/22 76.7 kg (169 lb 1.6 oz)   06/25/22 76.2 kg (168 lb)     GENERAL: Well developed, well-nourished male and is in no apparent distress. The patient is pleasant and interactive. Patient is a good historian.  No evidence of sedation or intoxication.    HEENT: Normocephalic/atraumatic.   CARDIOVASCULAR:  Warm, well perfused  RESPIRATORY:  Normal work of breathing, no supplemental 02  EXTREMITIES: No clubbing, cyanosis noted.  NEUROLOGIC:  Alert and oriented, speech fluent, normal language. Cranial nerves grossly intact.    MUSCULOSKELETAL:  Motor function  5/5 in upper and lower extremities.  Patient rises from a seated position with no difficulty.  The patient was able to ambulate with no difficulty throughout the clinic today without the assistance of a walking aid-None.     SKIN: No obvious rashes, lesions, or erythema.  PSY: Appropriate, full range affect, no psychomotor retardation    Documentation assistance was provided by Darcel Smalling, on October 08, 2022 at 10:21 AM for Dr. Susy Manor, DO.    Medical Decision Making    Problems Addressed:  1 or more chronic illness w/ exacerbation, progression, or side effects of treatment (moderate)-pain not at goal    Amount/Complexity:  Reviewed external records: [place] PDMP (today on day of visit)  Reviewed results: None  Ordered tests: None  Assessment requiring independent historian: None  Discussion of management/test results with medical professional: None  Independent interpretation of test: None    Risk  Moderate: Prescription Drug Management, Minor surgery w/ risk factors, Elective Major Surgery w/o risk factors, Diagnosis/treatment significantly limited by social determinants of health      We are delivering comprehensive, continuous, longitudinal care for this patient with chronic pain.

## 2022-10-11 NOTE — Unmapped (Signed)
Addended by: Carolynne Edouard on: 10/11/2022 01:05 PM     Modules accepted: Level of Service

## 2022-10-15 MED ORDER — TRAMADOL 50 MG TABLET
ORAL_TABLET | Freq: Every day | ORAL | 2 refills | 30 days | Status: CP | PRN
Start: 2022-10-15 — End: ?

## 2023-01-12 MED ORDER — TRAMADOL 50 MG TABLET
ORAL_TABLET | Freq: Every day | ORAL | 2 refills | 30 days | PRN
Start: 2023-01-12 — End: ?

## 2023-01-12 MED ORDER — PREGABALIN 225 MG CAPSULE
ORAL_CAPSULE | Freq: Two times a day (BID) | ORAL | 2 refills | 30 days
Start: 2023-01-12 — End: ?

## 2023-01-13 ENCOUNTER — Ambulatory Visit: Admit: 2023-01-13 | Discharge: 2023-01-14 | Payer: BLUE CROSS/BLUE SHIELD

## 2023-01-13 DIAGNOSIS — M25531 Pain in right wrist: Principal | ICD-10-CM

## 2023-01-13 DIAGNOSIS — M25532 Pain in left wrist: Principal | ICD-10-CM

## 2023-01-13 DIAGNOSIS — M2550 Pain in unspecified joint: Principal | ICD-10-CM

## 2023-01-13 DIAGNOSIS — G894 Chronic pain syndrome: Principal | ICD-10-CM

## 2023-01-13 MED ORDER — HYDROCODONE 5 MG-ACETAMINOPHEN 325 MG TABLET
ORAL_TABLET | Freq: Every day | ORAL | 0 refills | 30 days | Status: CP | PRN
Start: 2023-01-13 — End: ?

## 2023-01-13 MED ORDER — CYCLOBENZAPRINE 5 MG TABLET
ORAL_TABLET | Freq: Every evening | ORAL | 2 refills | 30 days | Status: CP | PRN
Start: 2023-01-13 — End: ?

## 2023-01-13 MED ORDER — PREGABALIN 225 MG CAPSULE
ORAL_CAPSULE | Freq: Two times a day (BID) | ORAL | 2 refills | 30 days | Status: CP
Start: 2023-01-13 — End: ?

## 2023-01-13 NOTE — Unmapped (Addendum)
It was good to see you today.    I have refilled your lyrica with no changes.    Start hydrocodone-acetaminophen 5-325 mg daily as needed for pain.     We will prescribe flexeril 5mg  tablets at night as needed, which is a muscle relaxant. It can sometimes be sedating, so do not drive or do anything with responsibility the first few times you take it until you know that you tolerate it.      We will see you in 2 months, or sooner if needed.

## 2023-01-13 NOTE — Unmapped (Signed)
Medication:traMADol (ULTRAM) 50 mg tablet   SIG:PT DID NOT BRING MEDICATION CONTAINER TO COUNT/DOCUMENT. PT STATED  I'VE NOT TAKEN THE MEDICATION SINCE MY LAST REFILL.  I don't have anymore meds.  Quantity on RX: #  Filled on:  Pill count today: #

## 2023-01-13 NOTE — Unmapped (Signed)
 Department of Anesthesiology  [facility]  [address]  [place], [name] [address]  [phone]    Chronic Pain Follow Up Note  1. Bilateral wrist pain    2. Chronic pain syndrome    3. Multiple joint pain      Assessment and Plan  Attending: Stiven Reisenauer is a 62 y.o. 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 [facility] for diffuse arthralgias and abdominal pain related to ostomy output.  He was first seen in April 2023.    The patient was referred to our clinic by General Surgery for reported significant LLQ abdominal pain near surgery site and continued high ostomy output but no other concerns after surgery.      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 His worst pain is related to rectal spasm and polyarthralgias, worst in the wrists (L > R) and bilateral hands. He did have XRAYs of the hands at [facility] in 2022 that were unremarkable.  He was started on lyrica due to initial concern for fibromyalgia and more neuropathic pain and is tolerating that well. He continues to follow with rheumatology for workup of arthritis (negative serologies, unlikely inflammatory arthritis) and is on methotrexate, previously prednisone (for UC prior to November surgery) but no longer on chronic steroids. He has done PT in the past and feels that he is as active (walking) as possible and benefits more from home exercises.     Has been challenging to find a regimen that provides significant benefit.    December 2024  At last visit in August,  the patient presented reporting overall unchanged pain since prior visit and presented primarily for medication refills. He denied any new or worsening symptoms. He continued to have polyarthralgia that significantly limits his activity levels. He continued to take Lyrica 225 mg every day and Tramadol 50 mg every day with no appreciable benefit. We encouraged patient to follow with PCP in regards to elbow pain and swelling along with using Voltaren gel for further pain relief. We can consider new xrays of hands if pain continued to worsen, given previous erosion seen on XRAY and I did not see any specific follow up documentation from the provider that ordered the scan to make it obvious if that was from degenerative causes vs inflammatory. He had not seen rheumatology in over 1 year.  He noted poor GI absorption of medications, so while he would like to work on better pain control, he realized options are limited.  He didn't tolerate butrans unfortunately.      Today, the patient presents reporting overall unchanged pain location and quality since last visit. He reports ongoing worsening polyarthralgia that significantly limits his activity levels. He denies any new pain. He states he stopped taking tramadol a month ago due to intolerance, sever nausea and vomiting. Pt has been on tramadol for a while but he states this is a new issues since the last three months, denied any new medication or food that could precipitate the intolerance. He states hydrocodone has helped in the past. We will start hydrocodone-acetaminophen 5-325 every  day PRN with no change in MME. Encouraged pt to follow up with rheumatology in which he had not seen them in over 1 year. We also added flexeril at HS to help with pain and sleep. He continues Lyrica with benefit without any adverse side effects.     Urine toxicology: 07/10/22   Urine toxicology screen appropriate  Treatment agreement: 07/10/22  COMM:   Last Opioid Change: Start butrans 10/2021, 01/2022-stop butrans, start tramadol 50 mg #20, 04/2022- Increase tramadol to #30, 01/13/23 stopped Tramadol, start Norco 5-325.   Last EKG:  None  Previous Compliance Issues: None  Naloxone ordered: N/A  Total morphine equivalents: 3.1  Benzodiazepine: No.  Pain Psychology: Consider referral in the future  [place] DOC: None  [place] database was reviewed 01/13/23.      Peripheral neuropathy of hands   - Underwent EMG, which was normal  - Not interested in scheduling biopsy for SFPN evaluation  - IFC unit ordered for neuropathic hand pain at previous visit    Polyarthritis of unknown etiology - Bilateral Wrist Pain   -Chronic, not at goal  - Continue following with Rheumatology, has not seen in some time  - can consider repeat xrays     Chronic pain syndrome  - Stop Tramadol, pt self discontinued med a month ago  - Start hydrocodone-acetaminophen 5-325 mg daily PRN #30 for pain (Patient counseled on Tylenol content. DNE 3000 mg/24 hrs)  - Continue Lyrica 225 mg BID; refilled   - Start flexeril 5 mg at HS PRN   - Continue home PT exercises and activity (enjoys walking)  - UDS/treatment agreement are up to date     Left Elbow Pain  No complaint today.   - Follow with PCP  - Use OTC Voltaren gel along with switching with Lidocaine cream  - can consider injections (lateral epicondyle injection) if pain does not improve     Future Considerations:  - Right lumbar MBBs  - Consider muscle relaxant for rectal spasm  -Other medications that dont rely on full GI absorption    Return in about 2 months (around 03/16/2023).    Requested Prescriptions     Signed Prescriptions Disp Refills    pregabalin (LYRICA) 225 MG capsule 60 capsule 2     Sig: Take 1 capsule (225 mg total) by mouth two (2) times a day.    cyclobenzaprine (FLEXERIL) 5 MG tablet 30 tablet 2     Sig: Take 1 tablet (5 mg total) by mouth nightly as needed for muscle spasms.    HYDROcodone-acetaminophen (NORCO) 5-325 mg per tablet 30 tablet 0     Sig: Take 1 tablet by mouth daily as needed for pain. HYDROcodone-acetaminophen (NORCO) 5-325 mg per tablet 30 tablet 0     Sig: Take 1 tablet by mouth daily as needed for pain. DNF 02/12/23     HPI:  Mr. Kevin Tran is seen in consultation at the request of [name], MD  For evaluation and recommendations regarding His chronic pain.     Patient was last seen in August. No notable history since last visit.     Today, the patient presents reporting ongoing worsening polyarthralgia that significantly limits his activity levels. He denies any new pain. He states he stopped taking tramadol a month ago due to intolerance, sever nausea and vomiting. He continues Lyrica with benefit without any adverse side effects.    Current Medications Regime:  Lyrica 225 mg BID  Tramadol 50 mg daily prn  The patient states his pain is located multiple joint and the severity of his pain ranges from 3/10 to 4/10.  His pain currently is 3/10 and on average is 4/10. He describes the sensation of his pain as aching, burning. His pain is present all of the time and worst mornings. The patient???s pain impacts enjoyment of life, general activity, mood, normal work, sleep. His interval history includes None. His pain has stayed the same, and he does not have new pain to discuss today. He is not on blood thinners or anti-coagulants. In regards to medications currently taken for pain management, the patient is tolerating these medications well and complains of associated side effects: None.    Previous Medication Trials: Diclofenac gel/voltaren gel, duloxetine/cymbalta (side effects), gabapentin, methocarbamol/robaxin, Naproxen/Aleve, and Pregabalin/Lyrica, compounded topical (not helpful), butrans (side effects), tramadol, hydrocodone     Prior interventions include medications, PT.   Referral to neurology 10/2021  Pain psychology referral 01/2022    Prior imaging include MRI, XR, CT, Nerve Conduction Study.     The treatment goals include Complete resolution with medications if necessary, Ability to return to some type of employment, and Ability to return to previous daily routine and activity     Allergies as of 01/13/2023    (No Known Allergies)      Current Outpatient Medications   Medication Sig Dispense Refill    acetaminophen (TYLENOL) 500 MG tablet Take 2 tablets (1,000 mg total) by mouth every six (6) hours as needed for pain.      folic acid (FOLVITE) 1 MG tablet TAKE 1 TABLET(1 MG) BY MOUTH DAILY 90 tablet 2    loperamide (IMODIUM A-D) 2 mg tablet Take 2 tablets (4 mg total) by mouth two (2) times a day as needed for diarrhea. 60 tablet 3    mesalamine (CANASA) 1000 MG suppository Insert 1 suppository (1,000 mg total) into the rectum nightly. 30 suppository 0    multivitamin (TAB-A-VITE/THERAGRAN) per tablet Take 1 tablet by mouth daily.      omeprazole (PRILOSEC OTC) 20 MG tablet Take 1 tablet (20 mg total) by mouth daily.      pregabalin (LYRICA) 225 MG capsule Take 1 capsule (225 mg total) by mouth two (2) times a day. 60 capsule 2    traMADol (ULTRAM) 50 mg tablet Take 1 tablet (50 mg total) by mouth daily as needed for pain. 30 tablet 2    tumeric-ging-olive-oreg-capryl 100 mg-150 mg- 50 mg-150 mg cap Take by mouth.       No current facility-administered medications for this visit.     Imaging/Tests:   XR SIJ 03/22/21  FINDINGS:   Symmetric sacroiliac joints. Mild periarticular sclerosis, similar to the prior CT pelvis 03/16/2021 and likely degenerative. No erosions. Mild arthrosis at the symphysis pubis. Visualized joints are preserved.   IMPRESSION  No radiographic findings of inflammatory arthropathy.     CT Pelvis 03/16/21  FINDINGS:       BLADDER: Partially distended, otherwise unremarkable.       PELVIC/REPRODUCTIVE ORGANS: Borderline enlarged prostate, measuring up to 4.8 cm.       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 The rectal stump is unremarkable.       PERITONEUM/RETROPERITONEUM AND MESENTERY: No free air. Mild interval decrease in previously described small volume of loculated fluid in the dependent pelvis/presacral space (5:67).       LYMPH NODES: No enlarged lymph nodes.       VESSELS: Scattered  calcified and noncalcified atheromatous plaques of the abdominal aorta and its branch vessels, which appear normal in caliber.       BONES AND SOFT TISSUES: Multilevel degenerative disc disease. No suspicious osseous lesions. Postsurgical changes of the anterior abdominal wall. Question tiny soft tissue tract extending to the left perianal soft tissues at approximately the 5:00 position (2:53). No drainable perianal fluid collection.   IMPRESSION  -- Sequelae of prior total colectomy with left lower quadrant end ileostomy. Mild interval decrease in previously described small volume partially loculated fluid in the dependent pelvis/presacral space.   --Tiny soft tissue tract extending through the left perianal soft tissues at approximately the 5:00 position, which may represent an old scarred down perianal fistulous tract. No drainable perirenal fluid collection.     CT Abdomen 01/21/21  FINDINGS:       LOWER CHEST: Redemonstrated right lower lobe subsegmental atelectasis versus scarring. Left-sided gravity dependent atelectasis. Otherwise unremarkable.       LIVER: Normal liver contour.  No focal liver lesions. Small volume focal fat along the falciform and gallbladder fossa, unchanged from 03/27/20.       BILIARY: No biliary ductal dilatation.  The gallbladder is physiologically distended and normal in appearance.       SPLEEN: Normal in size and contour.       PANCREAS: Normal pancreatic contour.  No focal lesions.  No ductal dilation.       ADRENAL GLANDS: Normal appearance of the adrenal glands.       KIDNEYS/URETERS: Symmetric renal enhancement.  No hydronephrosis.  No nephrolithiasis.       BLADDER: Physiologically distended and otherwise unremarkable.       REPRODUCTIVE ORGANS: Unremarkable.       GI TRACT: Postsurgical sequela of total colectomy with right lower quadrant ileostomy. Contrast opacifies the stomach and majority of the small bowel, but does not extend to the ileostomy site, likely due to contrast timing. There is also absence of contrast opacification in the right lower quadrant, although there are no abnormally dilated loops of proximal small bowel, and this is likely secondary to contrast timing. Rectal stump decompressed. No findings of acute inflammation.  Surgically absent appendix.       PERITONEUM, RETROPERITONEUM AND MESENTERY: There is mild swirling of the mesentery in the right lower quadrant, likely post-operative. No free air.  No ascites.  Small volume of simple free fluid in the dependent pelvis, likely normal postsurgical sequela.       LYMPH NODES: No adenopathy.       VESSELS: Hepatic and portal veins are patent.  Normal caliber aorta.  Moderate scattered atherosclerotic calcifications of the abdominal aorta and its branching vessels. Accessory left renal vein arising from the inferior pole.       BONES and SOFT TISSUES: Right lower quadrant ileostomy changes and additional anterior abdominal wall postsurgical changes. No focal rim-enhancing fluid collection. No aggressive osseous lesions.  No focal soft tissue lesions. Multilevel degenerative changes of the visualized spine, most severe at L5-S1 with loss of intervertebral disc space and vacuum disc phenomenon.   IMPRESSION  Sequela of interval total colectomy with right lower quadrant ileostomy. Loculated fluid is identified in the presacral region, however, there is no evidence of discrete wall enhancement to suggest abscess formation at this time.       Additional chronic and incidental findings as above.     Review of Systems:  General fatigue  Cardiovascular none  Gastrointestinal none  Skin none  Endocrine  none  Musculoskeletal back pain  Neurologic none  Psychiatric none    He denies any homicidal or suicidal ideation.     OBJECTIVE  PHYSICAL EXAM:  BP 131/80 (BP Position: Sitting)  - Pulse 69  - Resp 16  - Ht 172.7 cm (5' 8)  - Wt 78 kg (171 lb 14.4 oz)  - SpO2 94%  - BMI 26.14 kg/m??   Wt Readings from Last 3 Encounters:   01/13/23 78 kg (171 lb 14.4 oz)   10/08/22 75.9 kg (167 lb 6.4 oz)   07/10/22 76.7 kg (169 lb 1.6 oz)     GENERAL:  The patient is well developed, well-nourished, and appears to be in no apparent distress.   HEAD/NECK:    Normocephalic/atraumatic. clear sclera, pupils not pinpoint  CV:  Warm and well perfused.   LUNGS:   Normal work of breathing, no supplemental O2  EXTREMITIES:  No clubbing, cyanosis noted.  NEUROLOGIC:    The patient is alert and oriented, speech fluent, normal language.   MUSCULOSKELETAL:    Motor function  preserved.   GAIT:  The patient rises from a seated position with no difficulty and ambulates with nonantalgic gait without the assistance of a walking aid.   SKIN:   No obvious rashes, lesions, or erythema.  PSY:   Appropriate affect. No overt pain behaviors. No evidence of psychomotor retardation or agitation, no signs of intoxication.     We are delivering comprehensive, continuous, longitudinal care for this patient with chronic pain.

## 2023-02-11 MED ORDER — FOLIC ACID 1 MG TABLET
ORAL_TABLET | ORAL | 2 refills | 0.00 days
Start: 2023-02-11 — End: ?

## 2023-02-12 MED ORDER — HYDROCODONE 5 MG-ACETAMINOPHEN 325 MG TABLET
ORAL_TABLET | Freq: Every day | ORAL | 0 refills | 30 days | Status: CP | PRN
Start: 2023-02-12 — End: ?

## 2023-02-13 MED ORDER — FOLIC ACID 1 MG TABLET
ORAL_TABLET | ORAL | 1 refills | 0.00 days | Status: CP
Start: 2023-02-13 — End: ?

## 2023-02-13 NOTE — Unmapped (Signed)
Patient is requesting the following refill  Requested Prescriptions     Pending Prescriptions Disp Refills    folic acid (FOLVITE) 1 MG tablet [Pharmacy Med Name: FOLIC ACID 1MG  TABLETS] 90 tablet 2     Sig: TAKE 1 TABLET(1 MG) BY MOUTH DAILY       Recent Visits  Date Type Provider Dept   06/25/22 Office Visit [name], MD [facility]   Showing recent visits within past 365 days and meeting all other requirements  Future Appointments  Date Type Provider Dept   04/15/23 Appointment [name], MD [facility]   Showing future appointments within next 365 days and meeting all other requirements          Labs: Not applicable this refill      refills authorized x  6months

## 2023-03-12 NOTE — Unmapped (Signed)
Department of Anesthesiology  [facility]  [address]  [place], [name] [address]  [phone]    Chronic Pain Follow Up Note    1. Chronic pain syndrome    2. Multiple joint pain       Assessment and Plan  Attending: [name] is a 63 y.o. 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 [facility] for diffuse arthralgias and abdominal pain related to ostomy output.  He was first seen in April 2023.    The patient was referred to our clinic by General Surgery for reported significant LLQ abdominal pain near surgery site and continued high ostomy output but no other concerns after surgery.      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 His worst pain is related to rectal spasm and polyarthralgias, worst in the wrists (L > R) and bilateral hands. He did have XRAYs of the hands at [facility] in 2022 that were unremarkable.  He was started on lyrica due to initial concern for fibromyalgia and more neuropathic pain and is tolerating that well. He continues to follow with rheumatology for workup of arthritis (negative serologies, unlikely inflammatory arthritis) and is on methotrexate, previously prednisone (for UC prior to November surgery) but no longer on chronic steroids. He has done PT in the past and feels that he is as active (walking) as possible and benefits more from home exercises.     Has been challenging to find a regimen that provides significant benefit.    January 2025  At last visit in December, the patient presented reporting overall unchanged pain location and quality since the last visit. He reported ongoing worsening polyarthralgia that significantly limited his activity levels and denied any new pain. He stated he stopped taking tramadol a month ago due to intolerance, severe nausea, and vomiting. The patient had been on tramadol for a while but stated this was a new issue in the last three months and denied any new medication or food that could have precipitated the intolerance. He stated hydrocodone had helped in the past. Hydrocodone-acetaminophen 5-325 was started PRN with no change in MME. The patient was encouraged to follow up with rheumatology, which he had not done in over a year. Flexeril was added at HS to help with pain and sleep. He continued Lyrica with benefit and without any adverse side effects.    Today, patient presents reporting overall stable pain. He endorses notable benefit with the initiation of norco daily PRN on previous visit. He states for the last couple of weeks he has been stretching his norco use to every other day because it was written no refill on the medication bottle that he did not know he had one more refill at his pharmacy. We discussed he is given enough pills for daily PRN use, could skip a dose or so on the good days. He also endorse improved sleep with flexeril HS PRN. He takes tylenol 500 mg 1-2 daily PRN, patient counseled on Tylenol content of norco and DNE 3000 mg/24 hrs, agrees to this. He denies any new or worsening symptoms.  He reports pain is well controlled on current medication regimen without any associated adverse side effects.      Urine toxicology: 07/10/22   Urine toxicology screen appropriate  Treatment agreement: 07/10/22  COMM: 1 on 03/13/23  Last Opioid Change: Start butrans 10/2021, 01/2022-stop butrans, start tramadol 50 mg #20, 04/2022- Increase tramadol to #30, 01/13/23 stopped Tramadol, start Norco 5-325.   Last EKG:  None  Previous Compliance Issues: None  Naloxone ordered: N/A  Total morphine equivalents: 5  Benzodiazepine: No.  Pain Psychology: Consider referral in the future  [place] DOC: None  [place] database was reviewed 03/13/23.      Peripheral neuropathy of hands   - Underwent EMG, which was normal  - Not interested in scheduling biopsy for SFPN evaluation  - IFC unit ordered for neuropathic hand pain at previous visit    Polyarthritis of unknown etiology - Bilateral Wrist Pain   -Chronic. No compliant on today's visit  - Continue following with Rheumatology, has not seen in some time  - can consider repeat xrays     Chronic pain syndrome   - Continue hydrocodone-acetaminophen 5-325 mg daily PRN #30 for pain (Patient counseled on Tylenol content. DNE 3000 mg/24 hrs), one refill remains, refilled x2  - Continue Lyrica 225 mg BID; refilled   - Continue flexeril 5 mg at HS PRN, refilled  - Continue home PT exercises and activity (enjoys walking)  - UDS/treatment agreement are up to date     Left Elbow Pain   Left elbow bursitis, swollen. He follows with PCP.   - Continue follow up with PCP  - Use OTC Voltaren gel along with switching with Lidocaine cream  - can consider injections (lateral epicondyle injection) if pain does not improve     Future Considerations:  - Right lumbar MBBs  - Consider muscle relaxant for rectal spasm  -Other medications that dont rely on full GI absorption    Return in about 3 months (around 06/11/2023).    Requested Prescriptions     Signed Prescriptions Disp Refills    HYDROcodone-acetaminophen (NORCO) 5-325 mg per tablet 30 tablet 0     Sig: Take 1 tablet by mouth daily as needed for pain. DNF 04/12/23    pregabalin (LYRICA) 225 MG capsule 60 capsule 2     Sig: Take 1 capsule (225 mg total) by mouth two (2) times a day.    cyclobenzaprine (FLEXERIL) 5 MG tablet 30 tablet 2     Sig: Take 1 tablet (5 mg total) by mouth nightly as needed for muscle spasms.    HYDROcodone-acetaminophen (NORCO) 5-325 mg per tablet 30 tablet 0     Sig: Take 1 tablet by mouth daily as needed for pain. DNF 05/12/23 No orders of the defined types were placed in this encounter.      HPI:  Kevin Tran is seen in consultation at the request of Chriss Czar, PA  For evaluation and recommendations regarding His chronic pain.     Patient was last seen in December. No notable history since last visit.     Today, patient presents reporting overall stable pain. He endorses notable benefit with the initiation of narco daily PRN on previous visit. He states for the last couple of weeks he has been stretching his narco use to every other day because it was written no refill on the medication bottle that he did not know he had one more refill at his pharmacy. He also endorse improved sleep with  flexeril HS PRN. He takes tylenol 500 mg 1-2 daily PRN, patient understands on Tylenol content of narco and DNE 3000 mg/24 hrs. He denies any new or worsening symptoms. He reports pain is well controlled on current medication regimen without any associated adverse side effects.      Current Medications Regime:  Lyrica 225 mg BID  Hydrocodone-acetaminophen 5-325 mg daily PRN  Flexeril 5 mg at HS PRN     Previous Medication Trials: Diclofenac gel/voltaren gel, duloxetine/cymbalta (side effects), gabapentin, methocarbamol/robaxin, Naproxen/Aleve, and Pregabalin/Lyrica, compounded topical (not helpful), butrans (side effects), tramadol, hydrocodone     Prior interventions include medications, PT.   Referral to neurology 10/2021  Pain psychology referral 01/2022    Prior imaging include MRI, XR, CT, Nerve Conduction Study.     The treatment goals include Complete resolution with medications if necessary, Ability to return to some type of employment, and Ability to return to previous daily routine and activity     Allergies as of 03/13/2023    (No Known Allergies)      Current Outpatient Medications   Medication Sig Dispense Refill    acetaminophen (TYLENOL) 500 MG tablet Take 2 tablets (1,000 mg total) by mouth every six (6) hours as needed for pain.      cyclobenzaprine (FLEXERIL) 5 MG tablet Take 1 tablet (5 mg total) by mouth nightly as needed for muscle spasms. 30 tablet 2    folic acid (FOLVITE) 1 MG tablet TAKE 1 TABLET(1 MG) BY MOUTH DAILY 90 tablet 1    HYDROcodone-acetaminophen (NORCO) 5-325 mg per tablet Take 1 tablet by mouth daily as needed for pain. 30 tablet 0    HYDROcodone-acetaminophen (NORCO) 5-325 mg per tablet Take 1 tablet by mouth daily as needed for pain. DNF 02/12/23 30 tablet 0    mesalamine (CANASA) 1000 MG suppository Insert 1 suppository (1,000 mg total) into the rectum nightly. 30 suppository 0    multivitamin (TAB-A-VITE/THERAGRAN) per tablet Take 1 tablet by mouth daily.      omeprazole (PRILOSEC OTC) 20 MG tablet Take 1 tablet (20 mg total) by mouth daily.      pregabalin (LYRICA) 225 MG capsule Take 1 capsule (225 mg total) by mouth two (2) times a day. 60 capsule 2    tumeric-ging-olive-oreg-capryl 100 mg-150 mg- 50 mg-150 mg cap Take by mouth.       No current facility-administered medications for this visit.     Imaging/Tests:         Review of Systems:  See media  He denies any homicidal or suicidal ideation.     OBJECTIVE  PHYSICAL EXAM:  BP 151/91 (BP Site: R Arm, BP Position: Sitting)  - Pulse 67  - Resp 16  - Ht 172.7 cm (5' 8)  - Wt 77.6 kg (171 lb)  - SpO2 95%  - BMI 26.00 kg/m??   Wt Readings from Last 3 Encounters:   03/13/23 77.6 kg (171 lb)   01/13/23 78 kg (171 lb 14.4 oz)   10/08/22 75.9 kg (167 lb 6.4 oz)     GENERAL:  The patient is well developed, well-nourished, and appears to be in no apparent distress.   HEAD/NECK:    Normocephalic/atraumatic. clear sclera, pupils not pinpoint  CV:  Warm and well perfused   LUNGS:   Normal work of breathing, no supplemental O2  EXTREMITIES:  No clubbing, cyanosis noted.  NEUROLOGIC:    The patient is alert and oriented, speech fluent, normal language.   MUSCULOSKELETAL:  Motor function  preserved.   GAIT:  The patient rises from a seated position with no difficulty and ambulates with nonantalgic gait without the assistance of a walking aid.   SKIN:   No obvious rashes, lesions, or erythema.  PSY:   Appropriate affect. No overt pain behaviors. No evidence of psychomotor retardation or agitation, no signs of intoxication.     We are delivering comprehensive, continuous, longitudinal care for this patient with chronic pain.

## 2023-03-13 ENCOUNTER — Ambulatory Visit: Admit: 2023-03-13 | Discharge: 2023-03-14 | Payer: BLUE CROSS/BLUE SHIELD

## 2023-03-13 DIAGNOSIS — M2550 Pain in unspecified joint: Principal | ICD-10-CM

## 2023-03-13 DIAGNOSIS — G894 Chronic pain syndrome: Principal | ICD-10-CM

## 2023-03-13 MED ORDER — CYCLOBENZAPRINE 5 MG TABLET
ORAL_TABLET | Freq: Every evening | ORAL | 2 refills | 30.00 days | Status: CP | PRN
Start: 2023-03-13 — End: ?

## 2023-03-13 MED ORDER — PREGABALIN 225 MG CAPSULE
ORAL_CAPSULE | Freq: Two times a day (BID) | ORAL | 2 refills | 30.00 days | Status: CP
Start: 2023-03-13 — End: ?

## 2023-03-13 NOTE — Unmapped (Addendum)
It was good to see you today.    I have refilled your medications with no changes. You have one narco refill remains at your pharmacy, I have sent two more refills to last you unit next visit.     We will see you in 3 months, or sooner if needed.

## 2023-03-13 NOTE — Unmapped (Signed)
Medication:HYDROcodone-acetaminophen (NORCO) 5-325 mg per tablet   WRU:EAVW 1 tablet by mouth daily as needed for pain.   Quantity on RX: #30  Filled on:01/13/2023  Pill count today: #0

## 2023-03-13 NOTE — Unmapped (Signed)
 Reviewed with patient, their AVS can be viewed through their My Chart.  Pt voiced understanding

## 2023-04-12 MED ORDER — HYDROCODONE 5 MG-ACETAMINOPHEN 325 MG TABLET
ORAL_TABLET | Freq: Every day | ORAL | 0 refills | 30.00 days | Status: CP | PRN
Start: 2023-04-12 — End: ?

## 2023-04-13 NOTE — Unmapped (Signed)
 [facility]   FOLLOW UP NOTE - INFLAMMATORY BOWEL DISEASE  04/20/2023    Demographics:  Kevin Tran is a 63 y.o. year old male    Diagnosis:  Ulcerative Colitis  Disease onset (yr):  2018  Age at onset:  > 63yr old (A3)  Location:  Left-sided (E2)  Behavior:  Colitis  s/p TAC with end ileostomy          HPI / NOTE :     Interval Events:   1.  Last seen by me May 2024 - we discussed getting a surgical second opinion.  Also discussed was to slow down ostomy otuput (increased imodium to 2 pills TID). Also suggested canasa suppositories to help with rectal symptoms.   2. I reviewed notes from Pain Management clinic - joint pain and rectal spasms, on Lyrica + Flexeril + Norco 5/325 PRN daily.     HPI:   Overall, Kevin Tran is stable over the past year.  He is having a somewhat easier time with his stoma and pouching.  He notes that the concave ostomy appliance is helping considerably and he is having a fairly good seal with minimal leakage.  He is getting about 3 to 4 days about where (2 bags/week).  He notices that his ostomy output varies considerably with his diet.  He notes that starchy foods like potatoes thicken the output considerably.  He does have some rumbling and gurgling but this is not overly bothersome.    He is still using Imodium as prescribed.  He reports a good appetite and is maintaining his weight.  However he does feel his energy is low.  He notes that his arthritis is currently the worst symptom and more bothersome than his ostomy output or GI symptoms.    Abdominal pain (0-10): 0  BM a day: empties ostomy bag 5x/day  Consistency: watery   % of stools have blood: 0%  Nocturnal BM: NA ostomy  Urgency: NA ostomy  Weight change over last 6 mo:  Stable ~155-160lb (not gaining but not losing)  Smoking: no  NSAIDS: avoids    Review of Systems:   Review of systems positive for: fatigue, subjective feeling of dehydration, notes increased urinary frequency (was told he had large prostate in the past)  Otherwise, the balance of 10 systems is negative           IBD HISTORY:     Year of disease onset:  2018    Brief IBD Disease Course:  In late 2018 developed bloody diarrhea and incontinence. Colonoscopy in Feb 2019 showed edema and erythema in the rectum and sigmoid with decreased vascularity in the rest of the colon; the TI appeared normal. He was treated with courses of prednisone for much of 2019 and also put on mesalamine without improvement. In Fall 2019 he was started on Humira w/o improvement. In Dec 2019 he had moderate level antibodies to humira; was added; mesalamine was stopped; put on prednisone taper; rapidly improved  - Jan 2020 - stopped mesalamine, used weekly Humira + with initial improvement.   - April 2020 - notable worsening of diarrhea and abdominal pain  Colonoscopy with moderate inflammation mainly in rectum.  Changed to [name] 06/2018.  Had limited response initially, but improved with Uceris foam 09/2018.    - 09/2018 - taper to xeljanz 5mg  BID.  Nov 2020 - ongoing proctitis symptoms, restarted Uceris foam.   - 06/2019 - severe UC flare on [name], plan  to restage disease and change therapy  - 10/2019 - moderate proctitis (up to ~20cm), started canasa + Stelara.  Good symptom response to Stelara initially, but had severe arthralgias.   - Feb 2022 - worsening UC flare despite Stelara.  Admitted to [facility], discharged on prednisone taper.  Recurrent symptoms, readmitted 1 week later, started on IV Remicade 5mg /kg in the hospital on 04/06/2020. Low trough level April 2022 - increased dose to 10mg /kg.    - May 2022 - poor response to high dose Remicade.  Colectomy 06/29/2020 ([facility]). Surg path - severe chronic active colitis c/w UC.- Nov 2022 - attempted ostomy takedown and IPAA but had intra-op mesenteric vessel rupture and IPAA was aborted (left with end ileostomy).     Endoscopy:      - Colonoscopy Feb 2019: edema + erythema in rectum and sigmoid; decreased vascularity in rest of colon  - Dec 2019: moderate inflammation throughout the colon  - Colonoscopy 07/01/2018 - poor prep, moderate proctitis up to 15cm, mild patchy nodularity in remaining colon.  Normal terminal ileum.   - Colonoscopy 10/20/2019 - hemorrhoids.  Moderate (Mayo 2) colitis from anal verge up to 10cm.  Mild colitis from 10-20cm, then normal colon.  Normal terminal ileum.    PATH:  Moderately active chronic colitis  - Sigmoidoscopy 03/28/2020 - moderate (Mayo 2) colitis through the extent of the exam (up to the descending colon at least).    PATH:  Severe chronic active colitis with no CMV    Imaging:    - CT 01/28/18: left sided colitis; liver lesion (see below)  - MRI abdomen to evaluate liver lesion 02/09/18 - aforementioned liver lesion felt to be focal fatty infiltration;  6 month repeat recommended    Prior IBD medications (type, dose, duration, response):  [x]  5-ASAs - Mesalamine  - no improvement; possible paradoxical diarrhea  [x]  Oral corticosteroids - prednisone.  Good response with budesonide foam.   []  Intravenous corticosteroids  []  Antibiotics  [x]  Thiopurines: started in Dec 2019  []  Methotrexate  [x]  Anti-TNF therapies - Humira since Sept or Oct 2019. Low level and low titer Ab in Dec 2019. Increased to weekly dosing. Stopped spring 2020 due to non-response despite good drug levels.   []  Anti-Integrin therapies  []  Anti-Interleukin therapies  [x]  Anti-JAK therapies - Xeljanz 10mg  started 06/2018, tapered to 5mg  bid 09/2018  []  Cyclosporine  []  Clinical trial medication  []  Other (Please specify):    Extraintestinal manifestations:   -joint pains affecting: y, peripheral arthralgias  -eye: n  -skin: n  -oral ulcers :  n  -blood clots: n  -PSC: n  -other: n          Past Medical History:   Past medical history:   Past Medical History:   Diagnosis Date    Arthritis     Pneumothorax     Ulcerative colitis (CMS-HCC)      Past surgical history:   Past Surgical History:   Procedure Laterality Date    BACK SURGERY  1996    COLON SURGERY      COLONOSCOPY      LUNG SURGERY      PR COLONOSCOPY W/BIOPSY SINGLE/MULTIPLE Left 07/01/2018    Procedure: COLONOSCOPY, FLEXIBLE, PROXIMAL TO SPLENIC FLEXURE; WITH BIOPSY, SINGLE OR MULTIPLE;  Surgeon: [name], MD;  Location: [facility] PROCED[facility];  Service: Gastroenterology    PR COLONOSCOPY W/BIOPSY SINGLE/MULTIPLE  10/20/2019    Procedure: COLONOSCOPY, FLEXIBLE, PROXIMAL TO SPLENIC FLEXURE;  WITH BIOPSY, SINGLE OR MULTIPLE;  Surgeon: Luanne Bras, MD;  Location: [facility] PROCEDURES [facility];  Service: Gastroenterology    PR LAP, SURG ENTEROLYSIS Midline 06/29/2020    Procedure: ROBOTIC XI LAPAROSCOPY, SURGICAL, ENTEROLYSIS (FREEING OF INTESTINAL ADHESION) (SEPARATE PROCEDURE);  Surgeon: [name], MD;  Location: MAIN OR [facility];  Service: Gastrointestinal    PR LAP, SURG ENTEROLYSIS Midline 12/12/2020    Procedure: ROBOTIC LAPAROSCOPY, SURGICAL, ENTEROLYSIS (FREEING OF INTESTINAL ADHESION) (SEPARATE PROCEDURE);  Surgeon: [name], MD;  Location: MAIN OR [facility];  Service: Gastrointestinal    PR LAP, SURG PROCTECTOMY W COLOSTOMY Midline 12/12/2020    Procedure: ROBOTIC XI LAPAROSCOPY, SURGICAL; PROCTECTOMY, COMPLETE, COMBINED ABDOMINOPERINEAL, WITH COLOSTOMY;  Surgeon: [name], MD;  Location: MAIN OR [facility];  Service: Gastrointestinal    PR LAP,SURG,COLECTOMY,TOTAL,W/O PROCTECTOMY N/A 06/29/2020    Procedure: ROBOTIC XI LAPAROSCOPY, SURGICAL; COLECTOMY, TOTAL, ABDOMINAL, WITHOUT PROCTECTOMY, WITH ILEOSTOMY OR ILEOPROCTOSTOMY;  Surgeon: [name], MD;  Location: MAIN OR [facility];  Service: Gastrointestinal    PR RESECT SMALL INTEST,SINGL RESEC/ANAS Midline 12/12/2020    Procedure: ENTERECTOMY SM INTES; SNGL RESECT & ANASTOM;  Surgeon: [name], MD;  Location: MAIN OR [facility];  Service: Gastrointestinal    PR SIGMOIDOSCOPY,BIOPSY N/A 03/28/2020    Procedure: SIGMOIDOSCOPY, FLEXIBLE; WITH BIOPSY, SINGLE OR MULTIPLE;  Surgeon: Luanne Bras, MD;  Location: [facility] OR [facility];  Service: Gastroenterology     Family history:   Family History   Problem Relation Age of Onset    Cancer Mother         lung     Social history:   Social History     Socioeconomic History    Marital status: Married     Spouse name: None    Number of children: None    Years of education: None    Highest education level: None   Tobacco Use    Smoking status: Former     Current packs/day: 0.00     Types: Cigarettes     Quit date: 2006     Years since quitting: 19.1     Passive exposure: Never    Smokeless tobacco: Never   Vaping Use    Vaping status: Never Used   Substance and Sexual Activity    Alcohol use: Yes    Drug use: No    Sexual activity: Yes     Partners: Female           Allergies:   No Known Allergies          Medications:     Current Outpatient Medications   Medication Instructions    acetaminophen (TYLENOL) 1,000 mg, Every 6 hours PRN    cyclobenzaprine (FLEXERIL) 5 mg, Oral, Nightly PRN    folic acid (FOLVITE) 1 mg, Oral    HYDROcodone-acetaminophen (NORCO) 5-325 mg per tablet 1 tablet, Oral, Daily PRN, DNF 02/12/23    HYDROcodone-acetaminophen (NORCO) 5-325 mg per tablet 1 tablet, Oral, Daily PRN, DNF 04/12/23    [START ON 05/12/2023] HYDROcodone-acetaminophen (NORCO) 5-325 mg per tablet 1 tablet, Oral, Daily PRN, DNF 05/12/23    mesalamine (CANASA) 1,000 mg, Rectal, Nightly    multivitamin (TAB-A-VITE/THERAGRAN) per tablet 1 tablet, Daily (standard)    omeprazole (PRILOSEC OTC) 20 mg, Daily (standard)    pregabalin (LYRICA) 225 mg, Oral, 2 times a day (standard)    tumeric-ging-olive-oreg-capryl 100 mg-150 mg- 50 mg-150 mg cap Take by mouth.  Physical Exam:   BP 128/79 (BP Site: R Arm, BP Position: Sitting, BP Cuff Size: Large)  - Pulse 61  - Temp 35.8 ??C (96.5 ??F) (Temporal)  - Ht 172.7 cm (5' 8)  - Wt 78.2 kg (172 lb 6.4 oz)  - BMI 26.21 kg/m??     GEN: no apparent distress, appears comfortable on exam  HEENT: OP clear with no erythema, lesions, exudate, mucous membranes moist  CV:  RRR, no murmurs  PULM:  clear bilaterally, no rhonchi, rales or wheezes  ABD: Soft, nontender, no rebound/guarding, nondistended. Stoma site appears healthy  Extremities: no cyanosis, clubbing or edema, normal gait  Psych: affect appropriate, A&O x3  SKIN: no visible lesions on face, neck, arms, abdomen          Labs, Data & Indices:     Lab Review:   Lab Results   Component Value Date    WBC 5.7 04/15/2023    WBC 7.0 09/10/2021    WBC 4.9 02/22/2021    RBC 5.05 04/15/2023    RBC 4.74 02/22/2021    HGB 14.0 04/15/2023    HGB 12.8 (L) 02/22/2021     Lab Results   Component Value Date    AST 20 04/15/2023    AST 21 09/10/2021    ALT 31 04/15/2023    ALT 36 09/10/2021    BUN 13 04/15/2023    BUN 14 08/04/2019    Creatinine Whole Blood, POC 1.1 03/16/2021    Creatinine 0.73 04/15/2023    CO2 24.7 04/15/2023    CO2 24 08/04/2019    Albumin 4.0 04/15/2023    Calcium 10.1 04/15/2023    Calcium 10.0 08/04/2019     Lab Results   Component Value Date    TSH 0.829 04/15/2023         Diagnosis ICD-10-CM Associated Orders   1. Ulcerative colitis with complication, unspecified location (CMS-HCC)  K51.919 CBC     Comprehensive Metabolic Panel     Vitamin B12 Level     Ferritin     Iron & TIBC     TSH     TSH     Iron & TIBC     Ferritin     Vitamin B12 Level     Comprehensive Metabolic Panel     CBC      2. Ileostomy present (CMS-HCC)  Z93.2 CBC     Comprehensive Metabolic Panel     Vitamin B12 Level     Ferritin     Iron & TIBC     TSH     TSH     Iron & TIBC     Ferritin     Vitamin B12 Level     Comprehensive Metabolic Panel     CBC      3. Urinary frequency  R35.0 Ambulatory referral to Urology                Assessment & Recommendations:   Disease state: ulcerative pancolitis, post-op    Kevin Tran is a 63 y.o. male with ulcerative pancolitis (mostly proctosigmoiditis) since 2018. He was previously treated with Humira (mechanistic loss of response), Xeljanz (non-response) and Stelara (non-response).  He had a severe UC flare in Feb 2022 but had progressive colitis despite starting Infliximab. Hence, we proceeded with surgery (06/29/20) and he is status post colectomy with an end ileostomy.  Unfortunately he had an attempted IPAA surgery in November 2022 but there were intra-operative complications (bleeding, dusky  appearance of the pouch) and the pouch creation had to be aborted; most of the recum had been resected at that point. He hence has an end-ileostomy. He met with with Dr. Elenore Rota for second opinion in 2023, and he decided not to proceed with additional surgery at that time. We again discussed this briefly today including the possibility of a stoma revision, though he is having an easier time with pouching now.     The biggest issue with his quality of life now is ostomy output and maintaining good hydration. We discussed strategies to thicken and manage ostomy output as well as improve hydration status as detailed below.     Plan:  1.  Continue Imodium as you are doing, can increase the dose if needed (or take extra if output is loose)  2.  Try adding 1-2 tablespoons of chia seeds in your diet daily.   3.  Try doing rehydration solution for fluids instead of water (see instructions below).   4.  We will update labs today, including iron levels, B12 and blood counts  5.  Urinary frequency - suspected BPH, referral to urology  6.  Follow up with me in 6-12 months or as needed.      IBD health maintenance:  Influenza vaccine: given in Fall 2019  Pneumonia vaccine:   Hepatitis B: received twinrix 2 of 3;   TB testing: Quant gold neg in March 2019  Chickenpox/Shingles history: Shingrix completed 09/2018  Bone denistometry:   Derm appointment:  Last colonoscopy: 10/2019  -----------------------------------------------------------  I personally spent 33 minutes face-to-face and non-face-to-face in the care of this patient, which includes all pre, intra, and post visit time on the date of service.    Author: [name], MD 04/20/2023 12:25 PM   [name], MD  Associate Professor of Medicine  Division of Gastroenterology & Hepatology  [facility] [place] [facility]

## 2023-04-15 ENCOUNTER — Ambulatory Visit: Admit: 2023-04-15 | Discharge: 2023-04-16 | Payer: BLUE CROSS/BLUE SHIELD

## 2023-04-15 DIAGNOSIS — Z932 Ileostomy status: Principal | ICD-10-CM

## 2023-04-15 DIAGNOSIS — R350 Frequency of micturition: Principal | ICD-10-CM

## 2023-04-15 DIAGNOSIS — K51919 Ulcerative colitis, unspecified with unspecified complications: Principal | ICD-10-CM

## 2023-04-15 LAB — FERRITIN: FERRITIN: 155 ng/mL (ref 10.5–307.3)

## 2023-04-15 LAB — COMPREHENSIVE METABOLIC PANEL
ALBUMIN: 4 g/dL (ref 3.4–5.0)
ALKALINE PHOSPHATASE: 70 U/L (ref 46–116)
ALT (SGPT): 31 U/L (ref 10–49)
ANION GAP: 10 mmol/L (ref 5–14)
AST (SGOT): 20 U/L (ref <=34)
BILIRUBIN TOTAL: 0.7 mg/dL (ref 0.3–1.2)
BLOOD UREA NITROGEN: 13 mg/dL (ref 9–23)
BUN / CREAT RATIO: 18
CALCIUM: 10.1 mg/dL (ref 8.7–10.4)
CHLORIDE: 105 mmol/L (ref 98–107)
CO2: 24.7 mmol/L (ref 20.0–31.0)
CREATININE: 0.73 mg/dL (ref 0.73–1.18)
EGFR CKD-EPI (2021) MALE: >90 mL/min/{1.73_m2} (ref >=60)
GLUCOSE RANDOM: 95 mg/dL (ref 70–179)
POTASSIUM: 4 mmol/L (ref 3.4–4.8)
PROTEIN TOTAL: 7.3 g/dL (ref 5.7–8.2)
SODIUM: 140 mmol/L (ref 135–145)

## 2023-04-15 LAB — CBC
HEMATOCRIT: 41 % (ref 39.0–48.0)
HEMOGLOBIN: 14 g/dL (ref 12.9–16.5)
MEAN CORPUSCULAR HEMOGLOBIN CONC: 34.2 g/dL (ref 32.0–36.0)
MEAN CORPUSCULAR HEMOGLOBIN: 27.8 pg (ref 25.9–32.4)
MEAN CORPUSCULAR VOLUME: 81.3 fL (ref 77.6–95.7)
MEAN PLATELET VOLUME: 8.3 fL (ref 6.8–10.7)
PLATELET COUNT: 232 10*9/L (ref 150–450)
RED BLOOD CELL COUNT: 5.05 10*12/L (ref 4.26–5.60)
RED CELL DISTRIBUTION WIDTH: 13.4 % (ref 12.2–15.2)
WBC ADJUSTED: 5.7 10*9/L (ref 3.6–11.2)

## 2023-04-15 LAB — VITAMIN B12: VITAMIN B-12: 354 pg/mL (ref 211–911)

## 2023-04-15 LAB — IRON & TIBC
IRON SATURATION: 38 % (ref 20–55)
IRON: 116 ug/dL (ref 65–175)
TOTAL IRON BINDING CAPACITY: 305 ug/dL (ref 250–425)

## 2023-04-15 LAB — TSH: THYROID STIMULATING HORMONE: 0.829 u[IU]/mL (ref 0.550–4.780)

## 2023-04-15 NOTE — Unmapped (Signed)
 Kevin Tran it was a pleasure seeing you today.  Here is a summary from today's visit:     1.  Continue Imodium as you are doing, can increase the dose if needed (or take extra if output is loose)  2.  Try adding 1-2 tablespoons of chia seeds in your diet daily.   3.  Try doing rehydration solution for fluids instead of water (see instructions below).   4.  We will update labs today, including iron levels, B12 and blood counts  5.  If any trouble or symptoms, do not hesitate to call.     Here is some information on the rehydration solution we discussed:     1 Rehydration Solution:      2.  Handout from [place] [employer] [place]  This has some good information including various options like Gatorade, Chicken Broth, Tomato juice, or prune juice based rehydration solutions.     [email or web address]://[email or web address].[email or web address]        Sincerely,  [name], MD  [facility]  Division of Gastroenterology & Hepatology    Clinical Nurse Contact:  Alesia Banda, RN (covering for Neta Mends)  Ph#  [phone]  Fax#  [phone]    For clinic appointments, please call, [phone], option 1.  For questions regarding radiology appointments, or to schedule, [phone].  For questions regarding scheduling GI procedures (e.g,. Colonoscopy), please call, [phone], option 2.      [name], MD  Associate Professor of Medicine  Division of Gastroenterology & Hepatology  [place] [facility]        EXPECTATIONS FOR PATIENT FOLLOW UP AND COMMUNICATION:  -- Follow up Appointments:  Crohn's disease and Ulcerative colitis are serious chronic inflammatory diseases which require close monitoring.  I expect to see patients for a follow up visits at least every 6 months (or more often if you are having a flare, starting new therapy, etc).  In select cases, patients who are only on aminosalicylates / mesalamine, may be seen once per year for follow up.  If you are not able to come for follow up appointments we may not be able to refill your medications or continue caring for you.   -- Appointment Type: While we are continuing to offer video appointments in select cases (stable patients who are not in a flare and without new/active symptoms) during the COVID19 pandemic, I expect to see patients in person at least once per year.   -- Communication:  if you have any questions or concerns, you can communicate with [name] via phone (contact information below) or via myChart.  Note that phone messages are given higher priority and triaged first. For urgent issues, please contact us via phone.      IBD NURSE COORDINATOR CONTACT - Christy Gentles, RN     Phone: [phone] (direct line)      Fax: [phone]  * For urgent medical concerns after hours or on weekends and holidays, call [phone] and ask to speak to the GI Fellow on call.    * If you have a GI medical question or GI symptoms and would like to speak to your provider's healthcare team, please contact Christy Gentles, RN (contact information above) OR you can send the GI healthcare team a message through MyChart at [email or web address]    [facility] MESSAGES  -- MyChart messages and advice requests are now going to be sent to our clinical care team. A team member will respond to the message  or send it to your provider if needed.   -- The message will typically be addressed within 3-4 business days if it is an issue that can be handled by MyChart.   -- For multiple questions, complex concerns, and/or if you have not been by seen by your provider in the recommended follow-up period you may be asked to schedule a clinic visit.   -- MyChart messages are NOT read after hours or on weekends. MyChart is NOT meant for urgent issues or emergencies. For emergencies call 911 or go to the nearest emergency department.     APPOINTMENT SCHEDULING FOR GI CLINIC AND GI PROCEDURES:  RADIOLOGY - to schedule imaging ordered, please call [phone]  GI PROCEDURES         [phone] option 2   GI MEDICINE CLINIC  [phone] option 1   *To schedule, reschedule, or cancel your GI appointment, please call [phone]. If you are unable to come to an appointment, please notify us as soon as possible, preferably 24 hours in advance. Doing so may allow other patients with urgent needs to be scheduled in a cancelled appointment slot.     TEST RESULTS   If you have a MyChart account, your new results and a provider message will be sent to you through your MyChart account at [email or web address]. For results that require follow-up, a member of your healthcare team will also contact you.    PRESCRIPTION REFILL REQUESTS  To request prescription refills, please contact your pharmacy or send your healthcare team a message through your MyChart account at [email or web address]  RECORD REQUESTS  For questions related to medical records, please call Medical Records Release of Information at [phone]  FINANCIAL NAVIGATOR   For billing and other financial questions/needs - please contact Kathrin Greathouse - [phone]. If you need to leave a message, please be sure to leave your full name, date of birth or Medical record number, best call back # and reason for call.    For educational material and resources:  [email or web address].org/    ================================================================

## 2023-04-30 DIAGNOSIS — M2550 Pain in unspecified joint: Principal | ICD-10-CM

## 2023-04-30 DIAGNOSIS — G894 Chronic pain syndrome: Principal | ICD-10-CM

## 2023-04-30 MED ORDER — PREGABALIN 225 MG CAPSULE
ORAL_CAPSULE | 0 refills | 0.00 days
Start: 2023-04-30 — End: ?

## 2023-04-30 NOTE — Unmapped (Signed)
 Pt has one refill remains, which will last him until his next visit.

## 2023-04-30 NOTE — Unmapped (Signed)
 Spoke with Aram Beecham at  pharmacy they found script and will have it ready tomorrow after 11 and patient has been notified.

## 2023-05-12 MED ORDER — HYDROCODONE 5 MG-ACETAMINOPHEN 325 MG TABLET
ORAL_TABLET | Freq: Every day | ORAL | 0 refills | 30.00 days | Status: CP | PRN
Start: 2023-05-12 — End: ?

## 2023-06-04 NOTE — Unmapped (Signed)
 Department of Anesthesiology  [facility]  [address]  [place], [name] [address]  [phone]    Chronic Pain Follow Up Note    1. Pain medication agreement signed    2. Chronic pain syndrome    3. Multiple joint pain      Assessment and Plan  Attending: Wassim Kirksey is a 63 y.o. 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 [facility] diffuse arthralgias and abdominal pain related to ostomy output.  He was first seen in April 2023.    The patient was referred to our clinic by General Surgery for reported significant LLQ abdominal pain near surgery site and continued high ostomy output but no other concerns after surgery.      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 His worst pain is related to rectal spasm and polyarthralgias, worst in the wrists (L > R) and bilateral hands. He did have XRAYs of the hands at [facility] in 2022 that were unremarkable.  He was started on lyrica  due to initial concern for fibromyalgia and more neuropathic pain and is tolerating that well. He continues to follow with rheumatology for workup of arthritis (negative serologies, unlikely inflammatory arthritis) and is on methotrexate , previously prednisone  (for UC prior to November surgery) but no longer on chronic steroids. He has done PT in the past and feels that he is as active (walking) as possible and benefits more from home exercises.     Has been challenging to find a regimen that provides significant benefit.    April 2025  At last visit in January, the patient presented reporting overall stable pain. He endorsed notable benefit with the initiation of norco  daily PRN on previous visit. He stated for the last couple of weeks he had been stretching his norco  use to every other day because it was written no refill on the medication bottle that he did not know he had one more refill at his pharmacy. We discussed he was given enough pills for daily PRN use, could skip a dose or so on the good days. He also endorsed improved sleep with flexeril  HS PRN. He took tylenol  500 mg 1-2 daily PRN; patient counseled on Tylenol  content of norco  and DNE 3000 mg/24 hrs, agreed to this. He denied any new or worsening symptoms. He reported pain was well controlled on current medication regimen without any associated adverse side effects.      Today, patient presents reporting overall stable pain. He denies any new or worsening symptoms. In terms of medications, he continues to benefit from Norco , lyrica  and flexeril , which he takes about x 3/week. He reports his pain is controlled on current medication regimen without any associated adverse side effects. He takes tylenol  650 twice a day, reiterate Tylenol  content of norco  and DNE 3000 mg/24 hrs, verbalizes understanding.     Urine toxicology: 07/10/22   Urine toxicology screen appropriate  Treatment agreement: 07/10/22  COMM: 6 points on 06/05/23  Last Opioid Change: Start butrans  10/2021, 01/2022-stop butrans , start tramadol  50 mg #20, 04/2022- Increase tramadol  to #30, 01/13/23 stopped Tramadol ,  start Norco 5-325.   Last EKG:  None  Previous Compliance Issues: None  Naloxone ordered: N/A  Total morphine equivalents: 5  Benzodiazepine: No.  Pain Psychology: Consider referral in the future  [place] DOC: None  [place] database was reviewed 06/05/23.      Peripheral neuropathy of hands   - Underwent EMG, which was normal  - Not interested in scheduling biopsy for SFPN evaluation  - IFC unit ordered for neuropathic hand pain at previously    Polyarthritis of unknown etiology - Bilateral Wrist Pain   -Chronic. No compliant on today's visit  - Continue following with Rheumatology, has not seen in some time  - can consider repeat xrays     Chronic pain syndrome   - Continue hydrocodone-acetaminophen 5-325 mg daily PRN #30 for pain (Patient counseled on Tylenol content. DNE 3000 mg/24 hrs), refilled x3  - Continue Lyrica 225 mg BID; refilled   - Continue flexeril 5 mg at HS PRN, refill remains  - Continue home PT exercises and activity (enjoys walking)  - UDS/treatment agreement up dated today    Left Elbow Pain    He follows with PCP. No complain today  - Continue follow up with PCP  - Use OTC Voltaren gel along with switching with Lidocaine cream  - can consider injections (lateral epicondyle injection) if pain does not improve     Future Considerations:  - Right lumbar MBBs  - Consider muscle relaxant for rectal spasm  -Other medications that dont rely on full GI absorption    Return in about 3 months (around 09/04/2023).    Requested Prescriptions     Signed Prescriptions Disp Refills    HYDROcodone-acetaminophen (NORCO) 5-325 mg per tablet 30 tablet 0     Sig: Take 1 tablet by mouth daily as needed for pain. DNF 06/20/23    HYDROcodone-acetaminophen (NORCO) 5-325 mg per tablet 30 tablet 0     Sig: Take 1 tablet by mouth daily as needed for pain. DNF 07/20/23    HYDROcodone-acetaminophen (NORCO) 5-325 mg per tablet 30 tablet 0     Sig: Take 1 tablet by mouth daily as needed for pain. DNF 08/19/23    pregabalin (LYRICA) 225 MG capsule 60 capsule 2     Sig: Take 1 capsule (225 mg total) by mouth two (2) times a day.     Orders Placed This Encounter   Procedures    Drug Screen, [facility], Urine     Patient's Current Medications:   Not provided     Release to patient:   Immediate [1]    Misc nursing order (specify)     Please obtain treatment agreement     HPI:  Mr. Idrovo is seen in consultation at the request of Caleen Catalan, PA  For evaluation and recommendations regarding His chronic pain. Patient was last seen in January. Since last visit, the patient has followed with Gastroenterology.    Today, patient presents reporting overall stable pain. He denies any new or worsening symptoms. In terms of medications, he continues to benefit from Norco, lyrica and flexeril, which he takes about x 3/week. He reports his pain is controlled on current medication regimen without any associated adverse side effects. He takes tylenol 650 twice a day, he understands Tylenol content of norco and DNE 3000 mg/24 hrs.    Current Medications Regime:  Lyrica 225 mg BID  Hydrocodone-acetaminophen 5-325 mg daily PRN  Flexeril 5 mg at HS PRN  He denies any homicidal or suicidal ideation.     Previous Medication Trials: Diclofenac gel/voltaren gel, duloxetine/cymbalta (side effects), gabapentin, methocarbamol/robaxin, Naproxen/Aleve, and Pregabalin/Lyrica, compounded topical (not helpful), butrans (side effects), tramadol, hydrocodone     Prior interventions include medications, PT.   Referral to neurology 10/2021  Pain psychology referral 01/2022    Prior imaging include MRI, XR, CT, Nerve Conduction Study.     The treatment goals include Complete resolution with medications if necessary, Ability to return to some type of employment, and Ability to return to previous daily routine and activity     Allergies as of 06/05/2023    (No Known Allergies)      Current Outpatient Medications   Medication Sig Dispense Refill    acetaminophen (TYLENOL) 500 MG tablet Take 2 tablets (1,000 mg total) by mouth every six (6) hours as needed for pain.      cyclobenzaprine (FLEXERIL) 5 MG tablet Take 1 tablet (5 mg total) by mouth nightly as needed for muscle spasms. 30 tablet 2    folic acid (FOLVITE) 1 MG tablet TAKE 1 TABLET(1 MG) BY MOUTH DAILY 90 tablet 1    HYDROcodone-acetaminophen (NORCO) 5-325 mg per tablet Take 1 tablet by mouth daily as needed for pain. DNF 02/12/23 30 tablet 0    HYDROcodone-acetaminophen (NORCO) 5-325 mg per tablet Take 1 tablet by mouth daily as needed for pain. DNF 04/12/23 30 tablet 0    HYDROcodone-acetaminophen (NORCO) 5-325 mg per tablet Take 1 tablet by mouth daily as needed for pain. DNF 05/12/23 30 tablet 0    mesalamine (CANASA) 1000 MG suppository Insert 1 suppository (1,000 mg total) into the rectum nightly. 30 suppository 0    multivitamin (TAB-A-VITE/THERAGRAN) per tablet Take 1 tablet by mouth daily.      omeprazole (PRILOSEC OTC) 20 MG tablet Take 1 tablet (20 mg total) by mouth daily.      pregabalin (LYRICA) 225 MG capsule Take 1 capsule (225 mg total) by mouth two (2) times a day. 60 capsule 2    tumeric-ging-olive-oreg-capryl 100 mg-150 mg- 50 mg-150 mg cap Take by mouth.       No current facility-administered medications for this visit.     Review of Systems:  See media tab    OBJECTIVE  PHYSICAL EXAM:  BP 138/89 (BP Site: R Arm, BP Position: Sitting)  - Pulse 65  - Resp 16  - Ht 172.7 cm (5' 8)  - Wt 76.5 kg (168 lb 9.6 oz)  - SpO2 96%  - BMI 25.64 kg/m??   Wt Readings from Last 3 Encounters:   06/05/23 76.5 kg (168 lb 9.6 oz)   04/15/23 78.2 kg (172 lb 6.4 oz)   03/13/23 77.6 kg (171 lb)     GENERAL:  The patient is well developed, well-nourished, and appears to be in no apparent distress.   HEAD/NECK:    Normocephalic/atraumatic. clear sclera, pupils not pinpoint  CV:  Warm and well-perfused.  LUNGS:   Normal work of breathing, no supplemental O2  EXTREMITIES:  No clubbing, cyanosis noted.  NEUROLOGIC:    The patient is alert and oriented, speech fluent, normal language.   MUSCULOSKELETAL:    Motor function  preserved.    GAIT:  The patient rises from a seated position with no difficulty and ambulates with nonantalgic gait without the assistance of a walking aid.   SKIN:   No obvious rashes, lesions, or erythema.  PSY:   Appropriate affect. No overt pain behaviors. No evidence  of psychomotor retardation or agitation, no signs of intoxication.     We are delivering comprehensive, continuous, longitudinal care for this patient with chronic pain.

## 2023-06-05 ENCOUNTER — Ambulatory Visit: Admit: 2023-06-05 | Discharge: 2023-06-06 | Payer: BLUE CROSS/BLUE SHIELD

## 2023-06-05 DIAGNOSIS — Z0289 Encounter for other administrative examinations: Principal | ICD-10-CM

## 2023-06-05 DIAGNOSIS — G894 Chronic pain syndrome: Principal | ICD-10-CM

## 2023-06-05 DIAGNOSIS — M2550 Pain in unspecified joint: Principal | ICD-10-CM

## 2023-06-05 MED ORDER — PREGABALIN 225 MG CAPSULE
ORAL_CAPSULE | Freq: Two times a day (BID) | ORAL | 2 refills | 30.00 days | Status: CP
Start: 2023-06-05 — End: ?

## 2023-06-05 NOTE — Unmapped (Signed)
 Medication:HYDROcodone -acetaminophen  (NORCO ) 5-325 mg per tablet   SIG:: Take 1 tablet by mouth daily as needed for pain   Quantity on RX: #30  Filled on:05/21/23  Pill count today: # 16

## 2023-06-05 NOTE — Unmapped (Signed)
 It was good to see you today.    I have refilled your medications with no changes.    We will see you in 3 months, or sooner if needed.

## 2023-06-10 NOTE — Unmapped (Unsigned)
 Assessment:   This is a 63 y.o.-year-old male with a history of Ulcerative pancolitis S/P colectomy with an end ileostomy 06/29/20 and enlarged prostate who presents for evaluation of bothersome mixed lower urinary tract symptoms suggestive of symptomatic BPH. I reviewed with the patient today his urinary complaints, symptoms and evaluation in the clinic. His postvoid residual in clinic is 28 mls.     We discussed causes of LUTs including abnormalities of the prostate/bladder/urethra, infections, neurologic disease, obstruction and detrusor over/under activity.  We also discussed the role that medications, bladder irritants, fluid intake, constipation, edema and OSA can play in urinary tract symptoms.    We discussed the importance of behavioral modifications including timed and double voiding, avoiding constipation episodes, limiting intake of bladder irritant foods and beverages such as caffeine. As well as shifting fluid intake earlier in the day to decrease nocturia at night.   We discussed the utility of medication therapy such as alpha blockers and 5 alpha reductase inhibitors as well as risks and benefits. Also discussed potential for OAB mediation for irritative symptoms with risks and benefits.     We discussed further evaluation including a local cystoscopy procedure and imaging to evaluate size of the prostate to determine if more advance treatments may be useful including surgical options for prostatic hypertrophy (would not be able to perform TRUS (r/t PMH of rectum removal).     Through shared decision-making Kevin Tran. Flinchum would like to start Silodosin 8 mg daily (caution dizziness); deferred Flomax use related to concerns for dizziness  I will  check a PSA today and he will follow-up in 3 months to reassess symptoms. We will be happy to see him back anytime sooner if symptoms arise.  All questions were answered.  It was a pleasure seeing this patient in clinic today.      Plan:  - PSA today  - Start Silodosin 8 mg daily; caution dizziness  - Behavioral modifications    Follow up to clinic in 3 months for symptom check with PVR     Options further management/eval:    ===========================================================================================  Kevin Burr, MD  [address]  [id number] [employer]  [place],  [name] [address]    CHIEF COMPLAINT: History of BPH and bothersome lower urinary tract symptoms here for evaluation.    BRIEF HISTORY OF PRESENT ILLNESS:  Kevin Tran is very pleasant 63 y.o.-year-old male with history of Ulcerative pancolitis S/P colectomy with an end ileostomy 06/29/20 and enlarged prostate who was recently referred by Kevin Tran for the evaluation of bothersome lower urinary tract symptoms.     Today he reports doing well.   The patient notes urinary complaints for a prolonged amount of time. He admits to history of an enlarged prostate but has not had any recent follow for this. He has previously been maintained on medication therapy including Flomax although no longer taking because he ran out, feels like it was effective when he was taking it. His primary urinary complaints include worsening obstructive symptoms including straining, hesitancy and weak stream for several months; some urgency and freqency going hourly during the day; without leakage ; admits to feeling like he is not fully emptying   Nocturia X1-2/night although he has to get up regardless of urination to empty his colostomy   He is S/P extensive colon surgery with permanent colostomy and rectum removal     The patient denies a history of dysuria, hematuria or urinary tract infection. He  has never had a prostate biopsy in the past. He has no family history of prostate cancer. He does not have a history of sleep apnea or significant snoring. He denies anticoagulant use.  He denies history of urinary tract infections.  He denies fevers, chills, hematuria, pyuria, dysuria, urinary retention, chest pain, and shortness of breath.    - Currently tx with:Nothing, Flomax in past with success but did not restart it when he ran out   - Hx of prostatitis : none   - BM:  Permanent ostomy; follows with GI, difficulties with liquid stool and gas   - Fluids:  Drinks lots of water 3-5 bottle/day ; occasional beer only 1 on occasion; 1 coffee in am, 2-3 sodas/week     Has history of T use with androgel no longer using; no interest in further discussing this today.   Does admit to taking a supplement for his prostate Supabeta prostate   Notes occasional positional dizziness, worries about starting anything that could make this worse     IPSS  Incomplete Emptying: 4   Frequency: 4  Intermittency: 4  Urgency: 5  Weak stream: 5  Straining: 4  Nocturia: 2     Total: 28  (0-7 mild, 8-19 moderate, 20-35 severe)  4 - Mostly Displeased    PVR 28 mls  DIP Negative     No piror PSA hx     PAST MEDICAL HISTORY:     Past Medical History:   Diagnosis Date    Arthritis     Pneumothorax     Ulcerative colitis        PAST SURGICAL HISTORY:   Past Surgical History:   Procedure Laterality Date    BACK SURGERY  1996    COLON SURGERY      COLONOSCOPY      LUNG SURGERY      PR COLONOSCOPY W/BIOPSY SINGLE/MULTIPLE Left 07/01/2018    Procedure: COLONOSCOPY, FLEXIBLE, PROXIMAL TO SPLENIC FLEXURE; WITH BIOPSY, SINGLE OR MULTIPLE;  Surgeon: Kevin Burr, MD;  Location: [facility] PROCEDURES [facility];  Service: Gastroenterology    PR COLONOSCOPY W/BIOPSY SINGLE/MULTIPLE  10/20/2019    Procedure: COLONOSCOPY, FLEXIBLE, PROXIMAL TO SPLENIC FLEXURE; WITH BIOPSY, SINGLE OR MULTIPLE;  Surgeon: Dionicia Frater, MD;  Location: [facility] PROCEDURES [facility];  Service: Gastroenterology    PR LAP, SURG ENTEROLYSIS Midline 06/29/2020    Procedure: ROBOTIC XI LAPAROSCOPY, SURGICAL, ENTEROLYSIS (FREEING OF INTESTINAL ADHESION) (SEPARATE PROCEDURE);  Surgeon: Eusebio High, MD;  Location: MA[facility];  Service: Gastrointestinal    PR LAP, SURG ENTEROLYSIS Midline 12/12/2020    Procedure: ROBOTIC LAPAROSCOPY, SURGICAL, ENTEROLYSIS (FREEING OF INTESTINAL ADHESION) (SEPARATE PROCEDURE);  Surgeon: Eusebio High, MD;  Location: MAIN OR [facility];  Service: Gastrointestinal    PR LAP, SURG PROCTECTOMY W COLOSTOMY Midline 12/12/2020    Procedure: ROBOTIC XI LAPAROSCOPY, SURGICAL; PROCTECTOMY, COMPLETE, COMBINED ABDOMINOPERINEAL, WITH COLOSTOMY;  Surgeon: Eusebio High, MD;  Location: MAIN OR [place];  Service: Gastrointestinal    PR LAP,SURG,COLECTOMY,TOTAL,W/O PROCTECTOMY N/A 06/29/2020    Procedure: ROBOTIC XI LAPAROSCOPY, SURGICAL; COLECTOMY, TOTAL, ABDOMINAL, WITHOUT PROCTECTOMY, WITH ILEOSTOMY OR ILEOPROCTOSTOMY;  Surgeon: Eusebio High, MD;  Location: MAIN OR [facility];  Service: Gastrointestinal    PR RESECT SMALL INTEST,SINGL RESEC/ANAS Midline 12/12/2020    Procedure: ENTERECTOMY SM INTES; SNGL RESECT & ANASTOM;  Surgeon: Eusebio High, MD;  Location: MAIN OR [place];  Service: Gastrointestinal    PR SIGMOIDOSCOPY,BIOPSY N/A 03/28/2020    Procedure: SIGMOIDOSCOPY, FLEXIBLE; WITH  BIOPSY, SINGLE OR MULTIPLE;  Surgeon: Dionicia Frater, MD;  Location: [facility] OR [facility];  Service: Gastroenterology       ALLERGIES:    has no known allergies.    MEDICATIONS:  Current Outpatient Medications   Medication Sig Dispense Refill    acetaminophen (TYLENOL) 500 MG tablet Take 2 tablets (1,000 mg total) by mouth every six (6) hours as needed for pain.      cyclobenzaprine (FLEXERIL) 5 MG tablet Take 1 tablet (5 mg total) by mouth nightly as needed for muscle spasms. 30 tablet 2    folic acid (FOLVITE) 1 MG tablet TAKE 1 TABLET(1 MG) BY MOUTH DAILY 90 tablet 1    [START ON 06/20/2023] HYDROcodone-acetaminophen (NORCO) 5-325 mg per tablet Take 1 tablet by mouth daily as needed for pain. DNF 06/20/23 30 tablet 0    [START ON 07/20/2023] HYDROcodone-acetaminophen (NORCO) 5-325 mg per tablet Take 1 tablet by mouth daily as needed for pain. DNF 07/20/23 30 tablet 0    [START ON 08/19/2023] HYDROcodone-acetaminophen (NORCO) 5-325 mg per tablet Take 1 tablet by mouth daily as needed for pain. DNF 08/19/23 30 tablet 0    mesalamine (CANASA) 1000 MG suppository Insert 1 suppository (1,000 mg total) into the rectum nightly. 30 suppository 0    multivitamin (TAB-A-VITE/THERAGRAN) per tablet Take 1 tablet by mouth daily.      omeprazole (PRILOSEC OTC) 20 MG tablet Take 1 tablet (20 mg total) by mouth daily.      pregabalin (LYRICA) 225 MG capsule Take 1 capsule (225 mg total) by mouth two (2) times a day. 60 capsule 2    tumeric-ging-olive-oreg-capryl 100 mg-150 mg- 50 mg-150 mg cap Take by mouth.       No current facility-administered medications for this visit.        SOCIAL HISTORY:  Social History     Tobacco Use    Smoking status: Former     Current packs/day: 0.00     Types: Cigarettes     Quit date: 2006     Years since quitting: 19.3     Passive exposure: Never    Smokeless tobacco: Never   Vaping Use    Vaping status: Never Used   Substance Use Topics    Alcohol use: Yes    Drug use: No        FAMILY HISTORY:  Negative for prostate cancer disease.  Family History   Problem Relation Age of Onset    Cancer Mother         lung       REVIEW OF SYSTEMS:  A 10-system review of systems was completed by the patient and reviewed by me today.  All pertinent positives were discussed in history of present illness.    PHYSICAL EXAMINATION:  Vitals:    06/11/23 0852   BP: 135/83   Pulse: 66   Temp: 36.4 ??C (97.5 ??F)       VITAL SIGNS:  The patient is afebrile.  Vital signs are stable.  GENERAL:  In no apparent distress.  HEENT:  Eyes, ears and mouth appeared normal.  LYMPHATICS:  No cervical lymphadenopathy.  LUNGS:  Chest wall excursion symmetric bilaterally. No audible wheezing  ABDOMEN:  Soft, nontender, non distended; colostomy in place  BACK:  Negative CVA tenderness.     DRE: Unable to perform r/t PMH s/f abdominal colectomy with end ileostomy w/ rectum removal  EXTREMITIES: No lower extremity edema.  SKIN:  No rashes or jaundice.  NEUROLOGIC:  No focal deficits on neurologic exam.    LABORATORY TESTING:      Chemistry        Component Value Date/Time    NA 140 04/15/2023 1001    NA 140 08/04/2019 1056    K 4.0 04/15/2023 1001    K 4.8 08/04/2019 1056    CL 105 04/15/2023 1001    CL 104 08/04/2019 1056    CO2 24.7 04/15/2023 1001    CO2 24 08/04/2019 1056    BUN 13 04/15/2023 1001    BUN 14 08/04/2019 1056    CREATININE 0.73 04/15/2023 1001    CREATININE 1.1 03/16/2021 1548    GLU 95 04/15/2023 1001        Component Value Date/Time    CALCIUM 10.1 04/15/2023 1001    CALCIUM 10.0 08/04/2019 1056    ALKPHOS 70 04/15/2023 1001    ALKPHOS 58 08/04/2019 1056    AST 20 04/15/2023 1001    AST 21 09/10/2021 0000    ALT 31 04/15/2023 1001    ALT 36 09/10/2021 0000    BILITOT 0.7 04/15/2023 1001    BILITOT 0.6 08/04/2019 1056 1548    GLU 95 04/15/2023 1001        Component Value Date/Time    CALCIUM 10.1 04/15/2023 1001    CALCIUM 10.0 08/04/2019 1056    ALKPHOS 70 04/15/2023 1001    ALKPHOS 58 08/04/2019 1056    AST 20 04/15/2023 1001    AST 21 09/10/2021 0000    ALT 31 04/15/2023 1001    ALT 36 09/10/2021 0000    BILITOT 0.7 04/15/2023 1001    BILITOT 0.6 08/04/2019 1056                PSA trend:  No results found for: PSA, PSADIAG, PSASCRN, PSAFREE, PSATOT, PSARATIO    06/10/23 AUA symptom score total of *** with bother of ***  06/10/23 postvoid residual *** cc  06/10/23 uroflow: Q max *** mL per second, Q average *** mL per second, voided volume *** mL

## 2023-06-11 ENCOUNTER — Ambulatory Visit: Admit: 2023-06-11 | Discharge: 2023-06-12 | Payer: BLUE CROSS/BLUE SHIELD

## 2023-06-11 DIAGNOSIS — R339 Retention of urine, unspecified: Principal | ICD-10-CM

## 2023-06-11 DIAGNOSIS — Z125 Encounter for screening for malignant neoplasm of prostate: Principal | ICD-10-CM

## 2023-06-11 DIAGNOSIS — R350 Frequency of micturition: Principal | ICD-10-CM

## 2023-06-11 LAB — DRUG SCREEN, PAIN CLINIC, URINE
2-HYDROXY ETHYL FLURAZEPAM, UR: NOT DETECTED ng/mL
6-MONOACETYLMORPHINE, UR: NOT DETECTED ng/mL
7-AMINOCLONAZEPAM, UR: NOT DETECTED ng/mL
7-AMINOFLUNITRAZEPAM, UR: NOT DETECTED ng/mL
ALPHA-HYDROXY TRIAZOLAM, UR: NOT DETECTED ng/mL
ALPHA-HYDROXYALPRAZOLAM GLUCURONIDE, UR: NOT DETECTED ng/mL
ALPHA-HYDROXYALPRAZOLAM, UR: NOT DETECTED ng/mL
ALPRAZOLAM, UR: NOT DETECTED ng/mL
AMPHETAMINE, UR: NOT DETECTED ng/mL
BUPRENORPHINE, UR: NOT DETECTED ng/mL
CHLORDIAZEPOXIDE, UR: NOT DETECTED ng/mL
CLOBAZAM, UR: NOT DETECTED ng/mL
CLONAZEPAM, UR: NOT DETECTED ng/mL
COCAINE METABOLITES URINE: NEGATIVE ng/mL
CODEINE, UR: NOT DETECTED ng/mL
CODEINE-6-BETA-GLUCURONIDE, UR: NOT DETECTED ng/mL
COMMENT,URINE: NORMAL
CREATININE-O-ADULT: 39 mg/dL
DIAZEPAM, UR: NOT DETECTED ng/mL
DIHYDROCODEINE, UR: NOT DETECTED ng/mL
EDDP, UR: NOT DETECTED ng/mL
EPHEDRINE, UR: NOT DETECTED ng/mL
FLUNITRAZEPAM, UR: NOT DETECTED ng/mL
FLURAZEPAM, UR: NOT DETECTED ng/mL
HYDROMORPHONE, UR: NOT DETECTED ng/mL
HYDROMORPHONE-3-BETA-GLUCURONIDE, UR: NOT DETECTED ng/mL
LORAZEPAM GLUCURONIDE, UR: NOT DETECTED ng/mL
LORAZEPAM, UR: NOT DETECTED ng/mL
MDA, UR: NOT DETECTED ng/mL
MDEA, UR: NOT DETECTED ng/mL
MDMA, UR: NOT DETECTED ng/mL
MEPERIDINE, UR: NOT DETECTED ng/mL
METHADONE, UR: NOT DETECTED ng/mL
METHAMPHETAMINE, UR: NOT DETECTED ng/mL
METHYLPHENIDATE, UR: NOT DETECTED ng/mL
MIDAZOLAM, UR: NOT DETECTED ng/mL
MORPHINE, UR: NOT DETECTED ng/mL
MORPHINE-6-BETA-GLUCURONIDE, UR: NOT DETECTED ng/mL
N-DESMETHYLCLOBAZAM, UR: NOT DETECTED ng/mL
NALOXONE, UR: NOT DETECTED ng/mL
NORBUPRENOORPHINE GLUCURONIDE, UR: NOT DETECTED ng/mL
NORBUPRENORPHINE, UR: NOT DETECTED ng/mL
NORDIAZEPAM, UR: NOT DETECTED ng/mL
NORFENTANYL, UR: NOT DETECTED ng/mL
NORMEPERIDINE, UR: NOT DETECTED ng/mL
NOROXYCODONE, UR: NOT DETECTED ng/mL
NOROXYMORPHONE, UR: NOT DETECTED ng/mL
NORPROPOXYPHENE, UR: NOT DETECTED ng/mL
OXAZEPAM GLUCURONIDE, UR: NOT DETECTED ng/mL
OXAZEPAM, UR: NOT DETECTED ng/mL
OXIDANTS-O-ADULT: NEGATIVE
OXYCODONE, UR: NOT DETECTED ng/mL
OXYMORPHONE, UR: NOT DETECTED ng/mL
PH-O-ADULT: 5.5
PHENCYCLIDINE, UR: NOT DETECTED ng/mL
PHENTERMINE, UR: NOT DETECTED ng/mL
PRAZEPAM, UR: NOT DETECTED ng/mL
PROPOXYPHENE, UR: NOT DETECTED ng/mL
PSEUDOEPHEDRINE, UR: NOT DETECTED ng/mL
RITALINIC ACID, UR: NOT DETECTED ng/mL
SPECIFIC GRAVITY-O-ADULT: 1.008
TAPENTADOL, UR: NOT DETECTED ng/mL
TEMAZEPAM GLUCURONIDE, UR: NOT DETECTED ng/mL
TEMAZEPAM, UR: NOT DETECTED ng/mL
THC, SCREEN URINE: NEGATIVE ng/mL
TRAMADOL, UR: NOT DETECTED ng/mL
TRIAZOLAM, UR: NOT DETECTED ng/mL
URINE BARBITURATES MAYO: NEGATIVE ng/mL
ZOLPIDEM PHENYL-4-CARBOXYLIC ACID, UR: NOT DETECTED ng/mL
ZOLPIDEM, UR: NOT DETECTED ng/mL

## 2023-06-11 LAB — PSA: PROSTATE SPECIFIC ANTIGEN: 1.89 ng/mL (ref 0.00–4.00)

## 2023-06-11 MED ORDER — SILODOSIN 8 MG CAPSULE
ORAL_CAPSULE | Freq: Every day | ORAL | 3 refills | 90.00000 days | Status: CP
Start: 2023-06-11 — End: ?

## 2023-06-11 NOTE — Unmapped (Addendum)
-   PSA today   - Consider the following behavioral modifications for urgency/frequency   1.  Timed voiding every 2-3 hours by the clock and not on sensation alone  2.  Double voiding-trying to urinate again shortly after going to see if you can empty more  3.  Continue good bowel regimen to minimize constipation episodes  4.  Limit fluids 2-3 hours before bedtime  5.  Minimizing high diuretic type fluids such as tea, soda, alcohol, coffee, artifical sweeteners, and spicy foods which will increase urination frequency  - Start Silodosin medication, take once daily for obstructive urinary symptoms ; caution use with dizziness; see medication information sheet provided for more information     Follow up to clinic in 3 months or prn if symptoms worsen or change

## 2023-06-20 MED ORDER — HYDROCODONE 5 MG-ACETAMINOPHEN 325 MG TABLET
ORAL_TABLET | Freq: Every day | ORAL | 0 refills | 30.00 days | Status: CP | PRN
Start: 2023-06-20 — End: ?

## 2023-07-20 MED ORDER — HYDROCODONE 5 MG-ACETAMINOPHEN 325 MG TABLET
ORAL_TABLET | Freq: Every day | ORAL | 0 refills | 30.00 days | Status: CP | PRN
Start: 2023-07-20 — End: ?

## 2023-08-19 MED ORDER — HYDROCODONE 5 MG-ACETAMINOPHEN 325 MG TABLET
ORAL_TABLET | Freq: Every day | ORAL | 0 refills | 30.00 days | Status: CP | PRN
Start: 2023-08-19 — End: ?

## 2023-08-29 ENCOUNTER — Ambulatory Visit
Admit: 2023-08-29 | Discharge: 2023-08-30 | Payer: BLUE CROSS/BLUE SHIELD | Attending: Anesthesiology | Primary: Anesthesiology

## 2023-08-29 DIAGNOSIS — G609 Hereditary and idiopathic neuropathy, unspecified: Principal | ICD-10-CM

## 2023-08-29 DIAGNOSIS — M79642 Pain in left hand: Principal | ICD-10-CM

## 2023-08-29 DIAGNOSIS — M25531 Pain in right wrist: Principal | ICD-10-CM

## 2023-08-29 DIAGNOSIS — M25532 Pain in left wrist: Principal | ICD-10-CM

## 2023-08-29 DIAGNOSIS — G894 Chronic pain syndrome: Principal | ICD-10-CM

## 2023-08-29 DIAGNOSIS — M2550 Pain in unspecified joint: Principal | ICD-10-CM

## 2023-08-29 DIAGNOSIS — M79641 Pain in right hand: Principal | ICD-10-CM

## 2023-08-29 MED ORDER — PREGABALIN 225 MG CAPSULE
ORAL_CAPSULE | Freq: Two times a day (BID) | ORAL | 2 refills | 30.00000 days | Status: CP
Start: 2023-08-29 — End: ?

## 2023-08-29 MED ORDER — DESIPRAMINE 10 MG TABLET
ORAL_TABLET | ORAL | 2 refills | 0.00000 days | Status: CP
Start: 2023-08-29 — End: ?

## 2023-08-29 NOTE — Unmapped (Addendum)
 Department of Anesthesiology  [facility]  [address]  [place], [name] [address]  [phone]    Chronic Pain Follow Up Note    1. Bilateral hand pain    2. Chronic pain syndrome    3. Multiple joint pain    4. Bilateral wrist pain    5. Idiopathic peripheral neuropathy        Assessment and Plan  Attending: Burney Calzadilla is a 63 y.o. 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 [facility] diffuse arthralgias and abdominal pain related to ostomy output.  He was first seen in April 2023.    The patient was referred to our clinic by General Surgery for reported significant LLQ abdominal pain near surgery site and continued high ostomy output but no other concerns after surgery.      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 His worst pain is related to rectal spasm and polyarthralgias, worst in the wrists (L > R) and bilateral hands. He did have XRAYs of the hands at [facility] in 2022 that were unremarkable.  He was started on lyrica  due to initial concern for fibromyalgia and more neuropathic pain and is tolerating that well. He was previously followed by rheumatology for workup of arthritis (negative serologies, unlikely inflammatory arthritis) and was on prednisone  and then methotrexate  (for UC prior to November surgery) but is no longer on those medications. He has done PT in the past and feels that he is as active (walking) as possible and benefits more from home exercises. Has been challenging to find a regimen that provides significant benefit.    Last seen in April.  He returns today for regular follow up.     July 2025  At last visit in April patient presented reporting overall stable pain.  Patient takes Norco  x 1/day and states it is necessary to maintain his ADLs.  It appears his intake of Tylenol  has increased from 500 to 1000 mg daily to 2000 to 3000 mg daily as needed since last visit in April.  Patient was counseled on Tylenol  content of Norco  and DNE 3000 mg per 24 hours and to limit his extra strength Tylenol  to no more than 5 pills/day, patient verbalized understanding.  Patient was previously taking Flexeril  nightly, which did improve his sleep, however it also caused him drowsiness during the mornings and so he discontinued it 1 to 2 months ago.  Discussed starting desipramine  for pain and sleep starting at low dose.  Patient denied any new or worsening symptoms or any other associated side effects from his current regimen.    Peripheral neuropathy of hands   Chronic, worse today with increased activity  - Underwent EMG, which was normal  - Not interested in scheduling biopsy for SFPN evaluation  - Continue IFC unit for neuropathic hand pain as needed  - Start desipramine10 mg at night for 1 week, and then increase to 20 mg if tolerated, can titrate further in the future as tolerated  - Continue Lyrica  225 mg BID; refilled     Polyarthritis of unknown etiology -  Bilateral Wrist Pain   - Chronic.   - Continue following with Rheumatology, has not seen in some time    Chronic pain syndrome   - Continue hydrocodone -acetaminophen  5-325 mg daily PRN #30 for pain (Patient counseled on Tylenol  content. DNE 3000 mg/24 hrs), refilled   - Continue flexeril  5 mg at HS PRN, (avoids d/t drowsiness)  - Continue home PT exercises and activity (enjoys walking)    Left Elbow Pain    He follows with PCP. No complaint today  - Continue follow up with PCP  - Use OTC Voltaren gel along with switching with Lidocaine  cream  - can consider injections (lateral epicondyle injection) if pain does not improve     Urine toxicology: 06/05/23   Urine toxicology screen appropriate  Treatment agreement: 05/2023  COMM: 3 points on 08/29/23  Last Opioid Change: Start butrans  10/2021, 01/2022-stop butrans , start tramadol  50 mg #20, 04/2022- Increase tramadol  to #30, 01/13/23 stopped Tramadol , start Norco  5-325.   Last EKG:  None  Previous Compliance Issues: None  Naloxone ordered: N/A  Total morphine  equivalents: 5  Benzodiazepine: No.  Pain Psychology: Consider referral in the future  [place] DOC: None  [place] database was reviewed 08/29/23.        Future Considerations:  - Right lumbar MBBs  - Re-engaging Rheum for Wrist/hand pain  - Consider titrating up desipramine   -Consider small increase to hydrocodone  (to #45)  - Consider repeat wrist xrays   - Other medications that dont rely on full GI absorption    Return for Before 10/15, MD, NP, CPP.    Requested Prescriptions     Signed Prescriptions Disp Refills    pregabalin  (LYRICA ) 225 MG capsule 60 capsule 2     Sig: Take 1 capsule (225 mg total) by mouth two (2) times a day.    HYDROcodone -acetaminophen  (NORCO ) 5-325 mg per tablet 30 tablet 0     Sig: Take 1 tablet by mouth daily as needed for pain. Fill on or after: 09/28/23    HYDROcodone -acetaminophen  (NORCO ) 5-325 mg per tablet 30 tablet 0     Sig: Take 1 tablet by mouth daily as needed for pain. Fill on or after: 10/28/23    desipramine  (NORPRAMIN ) 10 MG tablet 60 tablet 2     Sig: Take 1 tablet PO at night for 1-2 weeks.  If tolerated, increase to 2 tablets PO at night and continue.     No orders of the defined types were placed in this encounter.    HPI:  Mr. Bonsell is seen in consultation at the request of Donnie Rock Gunner, PA  For evaluation and recommendations regarding His chronic pain.     Patient was last seen in April.    Today, patient reports his pain has remained unchanged and stable, however still impacting his ability to perform tasks.  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  He says he stays active mostly to keep his mind off of thinking about his pain.  His main complaint today is pain in his wrists and hands which she describes as dull and burning with some weakness.  He states he has no grip and has occasionally dropped items.  He states the pain is worse in the morning but improves throughout the day.  He says is the same level of  pain since it started when he received his colitis diagnosis. Patient was previously taking Flexeril  nightly, which did improve his sleep, however it also caused him drowsiness during the mornings and so he discontinued it 1 to 2 months ago.  Patient takes Norco  x 1/day and reports taking extra strength Tylenol  4-6 times per day (previously approximately 2 times per day) and states it is necessary to maintain his ADLs as he tries to be more active.   Patient denied any new or worsening symptoms or any other associated side effects from his current regimen.    Current Medications Regime:  Lyrica  225 mg BID  Hydrocodone -acetaminophen  5-325 mg daily PRN  Tylenol  500 mg 4-6x/day  Voltaren gel  Lidocaine  cream    Patient states that the highest level of pain is reported as 6/10 in intensity, least pain level is 4/10, pain currently is 5/10,  and average pain level is 5/10.  Pain is localized to shoulder, hands, and R hip     Pain is described as aching and sharp  Pain is present: All of the time  Pain is improved with medication and activity  The Pain is worse during Mornings  The patient reports that their pain negatively impacts: enjoyment of life and normal work  Changes to the patient's interval medical and social history are as follows: None  The patient denies new pain.   The patients pain is the same  Is the patient currently on blood thinners or anti-coagulants? No    In regards to medications currently taken for pain management the patient is tolerating these medications well.  denies associated side effects: none  Patient denies misuse, abuse or diversion of medications.   Patient reports being stable on this medication regimen and thinks that the medications do improve patient's quality of life and do improve patient's functionality level.   Patient reports that the patient is able to perform majority of ADLs on the current regimen.     Patient denies homicidal/suicidal ideation.     Review of Systems:  General none  Cardiovascular none  Gastrointestinal none  Skin none  Endocrine none  Musculoskeletal back pain  Neurologic migraines/headaches  Psychiatric none      Previous Medication Trials: Diclofenac gel/voltaren gel, duloxetine /cymbalta  (side effects), gabapentin , methocarbamol /robaxin , Naproxen/Aleve, and Pregabalin /Lyrica , compounded topical (not helpful), butrans  (side effects), tramadol , hydrocodone , desipramine      Prior interventions include medications, PT.   Referral to neurology 10/2021  Pain psychology referral 01/2022    Prior imaging include MRI, XR, CT, Nerve Conduction Study.     The treatment goals include Complete resolution with medications if necessary, Ability to return to some type of employment, and Ability to return to previous daily routine and activity     Allergies as of 08/29/2023    (No Known Allergies)      Current Outpatient Medications   Medication Sig Dispense Refill    acetaminophen  (TYLENOL ) 500 MG tablet Take 2 tablets (1,000 mg total) by mouth every six (6) hours as needed for pain.      cyclobenzaprine  (FLEXERIL ) 5 MG tablet Take 1 tablet (5 mg total) by mouth nightly as needed for muscle spasms. 30 tablet 2    folic acid  (FOLVITE ) 1 MG tablet TAKE 1 TABLET(1 MG) BY MOUTH DAILY 90 tablet 1    HYDROcodone -acetaminophen  (NORCO ) 5-325 mg per tablet Take 1 tablet by mouth daily as needed for pain. DNF 08/19/23 30 tablet 0    multivitamin (TAB-A-VITE/THERAGRAN) per tablet Take 1 tablet  by mouth in the morning.      omeprazole  (PRILOSEC  OTC) 20 MG tablet Take 1 tablet (20 mg total) by mouth in the morning.      silodosin  (RAPAFLO ) 8 mg cap Take 1 capsule (8 mg total) by mouth in the morning. 90 capsule 3    tumeric-ging-olive-oreg-capryl 100 mg-150 mg- 50 mg-150 mg cap Take by mouth.      desipramine  (NORPRAMIN ) 10 MG tablet Take 1 tablet PO at night for 1-2 weeks.  If tolerated, increase to 2 tablets PO at night and continue. 60 tablet 2    [START ON 09/28/2023] HYDROcodone -acetaminophen  (NORCO ) 5-325 mg per tablet Take 1 tablet by mouth daily as needed for pain. Fill on or after: 09/28/23 30 tablet 0    [START ON 10/28/2023] HYDROcodone -acetaminophen  (NORCO ) 5-325 mg per tablet Take 1 tablet by mouth daily as needed for pain. Fill on or after: 10/28/23 30 tablet 0    pregabalin  (LYRICA ) 225 MG capsule Take 1 capsule (225 mg total) by mouth two (2) times a day. 60 capsule 2     No current facility-administered medications for this visit.         OBJECTIVE  PHYSICAL EXAM:  BP 121/77  - Pulse 65  - Resp 16  - Ht 172.7 cm (5' 8)  - Wt 76.7 kg (169 lb)  - SpO2 98%  - BMI 25.70 kg/m??   Wt Readings from Last 3 Encounters:   08/29/23 76.7 kg (169 lb)   06/05/23 76.5 kg (168 lb 9.6 oz)   04/15/23 78.2 kg (172 lb 6.4 oz)     GENERAL:  The patient is well developed, well-nourished, and appears to be in no apparent distress.   HEAD/NECK:    Normocephalic/atraumatic. clear sclera, pupils not pinpoint  CV:  Warm and well-perfused.  LUNGS:   Normal work of breathing, no supplemental O2  NEUROLOGIC:    The patient is alert and oriented, speech fluent, normal language.   MUSCULOSKELETAL:    Motor function  preserved.    GAIT:  The patient rises from a seated position with no difficulty and ambulates with nonantalgic gait without the assistance of a walking aid.   PSY:   Appropriate affect. No overt pain behaviors. No evidence of psychomotor retardation or agitation, no signs of intoxication.     Medical Decision Making    Problems Addressed:  1 or more chronic illness w/ exacerbation, progression, or side effects of treatment (moderate)    Amount/Complexity:  Reviewed external records: [place] PDMP (today on day of visit)  Reviewed results: None  Ordered tests: None  Assessment requiring independent historian: None  Discussion of management/test results with medical professional: None  Independent interpretation of test: None    Risk  Moderate: Prescription Drug Management, Minor surgery w/ risk factors, Elective Major Surgery w/o risk factors, Diagnosis/treatment significantly limited by social determinants of health      We are delivering comprehensive, continuous, longitudinal care for this patient with chronic pain.     I saw and evaluated the patient, participating in the key portions of the service.  I reviewed the resident's note and agree with the findings and plan.  Prentice Cost, MD

## 2023-08-29 NOTE — Unmapped (Signed)
 Present during controlled medication pill count.  Correct count verified.

## 2023-08-29 NOTE — Unmapped (Signed)
 AVS and Rxes reviewed.  Below controlled  Rxes sent electronically to pt's pharmacy.  Pt voiced understanding.    HYDROcodone -acetaminophen  (NORCO ) 5-325 mg per tablet 30 tablet 0 09/28/2023 --    Sig - Route: Take 1 tablet by mouth daily as needed for pain. Fill on or after: 09/28/23 - Oral    Sent to pharmacy as: hydrocodone  5 mg-acetaminophen  325 mg tablet (NORCO )    Earliest Fill Date: 09/28/2023    E-Prescribing Status: Receipt confirmed by pharmacy (08/29/2023  9:56 AM EDT)    Prior authorization: Closed - Other        HYDROcodone -acetaminophen  (NORCO ) 5-325 mg per tablet 30 tablet 0 10/28/2023 --    Sig - Route: Take 1 tablet by mouth daily as needed for pain. Fill on or after: 10/28/23 - Oral    Sent to pharmacy as: hydrocodone  5 mg-acetaminophen  325 mg tablet (NORCO )    Earliest Fill Date: 10/28/2023    E-Prescribing Status: Receipt confirmed by pharmacy (08/29/2023  9:56 AM EDT)    Prior authorization: Closed - Prior Authorization not required for patient/medication

## 2023-08-29 NOTE — Unmapped (Addendum)
 Today we did the following   -It was good to see you    -If we have prescribed you a medication, be prepared that your insurance company may require additional paperwork (even for generic medication), typically called a prior authorization.  This sometimes can delay the prescription, but know that our office is very efficient at taking care of these.  If it is significantly delayed, you may call the pharmacy for updates, or call the office.    -Unfortunately many opioid medications have been on shortage lately.  It is helpful to contact your pharmacy up to a week before your fill date to verify if they will have medication (sometimes they know, sometimes they may not).  If we need to send a prescription to a different pharmacy, please let us  know what pharmacy has it in stock and provide us  the name, address and phone number so we can transfer a prescription.  The earlier you are able to do this, decreases the chance that you run out of medication.  It has been challenging, but we will do our best to accommodate, but many times it takes us  48-72 hours to respond to phone calls or mychart messages, we typically are unable to make last minute changes.    Narcan is now available Over the Counter (OTC), meaning you can pick it up at many pharmacies without a prescription.  The Tran is around $45.      -Continue hydrocodone . You have one script at your pharmacy.  I sent scripts for August and September    -I have refilled pregabalin  unchanged    -As discussed, we are going to start a new medication called Desipramine , which is good for pain and sleep.  It is just taken once a day at night.    Week 1-take 1 capsule at night.    Week 2-if tolerated, increase to 2 capsules at nightand continue.    Some side effects with this medication include sedation (helps with sleep) but sometimes go into the next day, dry mouth, cloudy thinking, constipation, nausea, sexual dysfunction and other less common things.    -Continue follow up with GI to work on decreasing GI transit.  Keep working on bulking up what you are eating.    -Follow up in 3 months, before 10/15    -Please plan to arrive to all appointments at least 30 minutes prior to your scheduled appointment time. Failure to do so may delay your visit.    *Bring opioid medication with you to each visit    MyChart Messages:  Please use MyChart for non-urgent symptoms or matters such as general questions, non-urgent prescription refills, or non-urgent scheduling issues. For your safety and best  care please DO NOT use MyChart messages to report emergent or urgent symptoms as messages are only checked during regular business hours.  These messages are checked by the nurses during normal business hours 8:30 am-4:30 pm Monday-Friday every 24-48 hours and are for non-urgent, non-emergent concerns. You may be asked to return for a follow up visit if it is deemed your questions are best handled in the clinic setting.  Please call our nurse line below for more time sensitive issues. If our office is closed you can contact the hospital operator to reach the on call provider available for urgent/emergent issues only.     DO:  1. Read the medication guide  2. Take medication exactly as prescribed  3. Store medication in a safe place, away from children and  pets, or anyone who might  steal or misuse it. Store it in a safe and secure place (lock box). Also secure any  unfilled prescriptions.   4. Bring unused medication to the next visit so it can be disposed of safely  5. Tell the prescribing provider if you experience any side effects from the medication. You may also report side effects to the FDA at 1-800-FDA-1088.  6. DO check your states rules regarding opioids and driving.    DO NOT:  1. Give your medications to others  2. Take medication unless it was prescribed to you  3. Take more medication than prescribed   4. Stop taking medication without first talking to your provider  5. Break, chew, crush, dissolve, or inject medications. If you cannot swallow your medications, talk to your provider.   6. Drink alcohol or use illegal drugs while taking this medication    BENZODIAZEPINES: Taking opioid pain medications in combination with benzodiazepines can increase the risk of overdose and death.     -Because of the high volume of calls we receive and the high demand for our clinical services, we are occupied all day providing care for patients in the clinic. This leaves little time to respond to phone calls, and we are generally unable to discuss patient care advice over the telephone. If you are experiencing a medication side effect or complication, you can call and let us  know, but we will typically not make a medication substitution or change over the telephone.     We are generally unable to respond acutely to a flare up of pain, as this is quite common in our patients and needs to be dealt with as part of the long term management plan. Please make an appointment with us  if you wish to discuss a matter in any detail. Should you still need to call, please do so at [phone].     Please do not call for early medication refills.     Thank you for choosing [facility]. It was a pleasure to see you in clinic today. Please contact us  with any questions or concerns at [phone].     Kevin Cost, MD    08/29/2023  Anesthesiologist and Pain Management Provider  [facility]      -If you or someone you know is having a mental health emergency, you can dial [phone] (instead of 911), for the mental health emergency line.      What are common side effects from pain medications you may be taking?    Do not drive or do anything with responsibility until you know you are tolerating a new medication.    Many pain type medications can be sedating, especially in combination with others.  They can also decrease your breathing especially in combination with other medications.  If a medication is not effective for you, talk to us  about weaning off (do not stop abruptly on your own), so we do not just continue it.    Only take medications as prescribed.  If a medication is prescribed as needed, it means you can use it as you need it and do not have to take it every day.  Do not ever take more than what is prescribed to you for any medication.  Not only could this be dangerous, but if you do not take it as prescribed we may have to discontinue it due to safety concerns.  If you feel like your medications are not controlling your  medications, do not adjust the medication on your own.  Please discuss at your next clinic visit and we can determine a plan.    Opioids/Narcotics (Examples: oxycodone , hydrocodone , tramadol , morphine , hydromorphone , fentanyl  patch)  ?? Constipation-It is important to treat this early and effectively - your doctor can help!  ?? Itching  ?? Nausea  ?? Dizziness or lightheadedness  ?? Drowsiness or sedation  ?? Decreased breathing   ?? Addiction  Sexual difficulties or loss of interest.  Mood changes.  Cognitive impairment (not thinking or responding normally).     Anticonvulsants (Examples: Gabapentin , Pregabalin , Carbamazepine, topiramate)  ?? Dizziness or lightheadedness  ?? Drowsiness or sedation  ?? Nausea  ?? Leg swelling     Antidepressants (Examples: duloxetine ,amitriptyline, nortriptyline, venlafaxine)  New warning in 2019 that these medications in combination with opioids can cause respiratory depression.  ?? Dizziness or lightheadedness  ?? Drowsiness or sedation  ?? Constipation  ?? Urinary retention  ?? Dry mouth  ?? Nausea  ?? Difficulty falling asleep     NSAIDS (Examples: Ibuprofen, naproxen, meloxicam, celecoxib )  -The FDA put out guidelines in 2015 that NSAIDs should not be used regularly due to safety concerns.  OK to use a few times a month, or for 1-2 weeks for a flare of pain.  If you take NSAIDs everyday, we should discuss this and stop it due to safety.  ?? Bleeding  ?? Stomach ulcers  ?? Kidney problems  -Heart Attack  -Stroke     Muscle relaxants (Examples: cyclobenzaprine , tizanidine, baclofen)  ?? Dizziness or lightheadedness  ?? Drowsiness or sedation  ?? Nausea  ?? Low blood pressure  ?? Dry mouth     Many pain medications can lead to a rare, potentially dangerous condition called serotonin syndrome. Symptoms can include tremors, agitation, fever, muscle rigidity/contractions, diarrhea, excessive sweating, palpitations and high blood pressure. If these symptoms arise, please contact us  or report to the emergency room for further management.      Your pharmacist can answer any questions you may have in regard to all your medications or possible interactions.    OPIOID(NARCOTIC) MEDICATIONS    You must obtain you narcotic/opioid medications from the [facility] and inform your Pain physician if you receive pain medications (narcotics) from another doctor or emergency room.    It is important that you secure your pain medications to keep them safe from children. You must not share, sell or trade your pain medications with anyone. You must not take anyone else's pain medications. You should not take more pain medications than prescribed without asking your pain doctor first.    You will not receive extra refills for lost or stolen medications, regardless of the circumstances. You may be charged with driving under the influence (DUI) if you drive while on pain medications.    Pain medications can cause sleepiness, dangerously slow breathing and even death. If you or a family member notice a change in your breathing, notify the pain physician immediately.  If you stop breathing or are found unconscious, your family should immediately call 911. Use of pain medicines with alcohol can increase the risk of these complications.    If you stop taking pain medications too quickly, you may have side effects such as sweating, shaking, loss of temper, nausea, diarrhea and increased pain.    A narcotic overdose is the misuse or overuse (doubling up on pills or taking doses too close together) of a narcotic drug. A narcotic overdose can make  you pass out and stop breathing. This is especially true if you drink alcohol or use valium -like medications in addition to the opioid pain medication.  If you stop breathing or are found unconscious, your family should immediately call 911.    You may experience constipation while taking these medications.  You may need to use a stool softener and laxative.    Opiates (narcotics) are medicines used to relieve moderate to severe pain. They may be used for a short time for pain, such as after surgery. Or they may be used for long-term pain. They don't cure a health problem. But they help you manage the pain.  Opiates relieve pain by changing the way your body feels pain and the way you feel about pain. Opiates are powerful medicines. You may need to take extra steps to stay safe. Taking too much (overdose) of an opiate can cause death.    SPECIAL RISK:  Taking your medication in combination with a benzodiazepine medication (nerve pill such as Valium , Xanax, Klonopin, Ativan) can increase the risk to you of overdose and death.      What to know about taking this medicine  Your body gets used to opiates if you take them all the time. You could have withdrawal symptoms when you stop taking them. Symptoms include nausea, sweating, chills, diarrhea, and shaking. But you can avoid these symptoms if you slowly stop taking the medicine as your doctor tells you to.  You have a small chance of addiction if you take opiates as prescribed. Your risk is a bit higher if you have abused drugs in the past.  Some opiates have acetaminophen  in them. Check the labels on all the other medicines you take. This includes over-the-counter drugs. Many medicines have acetaminophen . Do not take others with acetaminophen  in them unless your doctor has told you to. Taking too much acetaminophen  can be harmful. Talk to your doctor or pharmacist if you have questions about this.  Be sure you know how to safely get rid of any leftover medicine. Talk to your doctor or pharmacist about how to do this. Ask for written instructions.    When should you call for help?  Call 911 anytime you think you may need emergency care. For example, call if:  You have trouble breathing.  You have swelling of your face, lips, tongue, or throat.  You have signs of an overdose. These include:  Cold, clammy skin.  Confusion.  Severe nervousness or restlessness.  Severe dizziness, drowsiness, or weakness.  Slow breathing.  Seizures.    Get rid of unused opioid pain medication safely - If you have opioid pills, liquid or patches that you are not going to use, get rid of them right away. Check the [place]  Drug Enforcement Administration (DEA) website (http://www.tucker.net/) for approved locations near you.

## 2023-08-29 NOTE — Unmapped (Signed)
 Medication: Hydrocodone -APAP 5-325  SIG: Take 1 tablet by mouth every day as needed for pain  Quantity on RX: # 30  Filled on: 07/30/23  Pill count today: # 0

## 2023-09-14 LAB — CBC W/ AUTO DIFF
BASOPHILS ABSOLUTE COUNT: 0 10*9/L (ref 0.0–0.1)
BASOPHILS RELATIVE PERCENT: 0.3 %
EOSINOPHILS ABSOLUTE COUNT: 0.3 10*9/L (ref 0.0–0.5)
EOSINOPHILS RELATIVE PERCENT: 2.5 %
HEMATOCRIT: 47.3 % (ref 39.0–48.0)
HEMOGLOBIN: 16.1 g/dL (ref 12.9–16.5)
LYMPHOCYTES ABSOLUTE COUNT: 0.7 10*9/L — ABNORMAL LOW (ref 1.1–3.6)
LYMPHOCYTES RELATIVE PERCENT: 5.3 %
MEAN CORPUSCULAR HEMOGLOBIN CONC: 34.1 g/dL (ref 32.0–36.0)
MEAN CORPUSCULAR HEMOGLOBIN: 27.6 pg (ref 25.9–32.4)
MEAN CORPUSCULAR VOLUME: 81.1 fL (ref 77.6–95.7)
MEAN PLATELET VOLUME: 8.3 fL (ref 6.8–10.7)
MONOCYTES ABSOLUTE COUNT: 0.8 10*9/L (ref 0.3–0.8)
MONOCYTES RELATIVE PERCENT: 6.1 %
NEUTROPHILS ABSOLUTE COUNT: 10.8 10*9/L — ABNORMAL HIGH (ref 1.8–7.8)
NEUTROPHILS RELATIVE PERCENT: 85.8 %
NUCLEATED RED BLOOD CELLS: 0 /100{WBCs} (ref <=4)
PLATELET COUNT: 245 10*9/L (ref 150–450)
RED BLOOD CELL COUNT: 5.83 10*12/L — ABNORMAL HIGH (ref 4.26–5.60)
RED CELL DISTRIBUTION WIDTH: 13.6 % (ref 12.2–15.2)
WBC ADJUSTED: 12.6 10*9/L — ABNORMAL HIGH (ref 3.6–11.2)

## 2023-09-14 LAB — LIPASE: LIPASE: 35 U/L (ref 12–53)

## 2023-09-14 LAB — URINALYSIS WITH MICROSCOPY WITH CULTURE REFLEX PERFORMABLE
BILIRUBIN UA: NEGATIVE
BLOOD UA: NEGATIVE
GLUCOSE UA: NEGATIVE
GRANULAR CASTS: 4 /LPF — ABNORMAL HIGH (ref <=0)
HYALINE CASTS: >182 /LPF — ABNORMAL HIGH (ref 0–1)
LEUKOCYTE ESTERASE UA: NEGATIVE
NITRITE UA: NEGATIVE
PH UA: 5.5 (ref 5.0–9.0)
PROTEIN UA: 70 — AB
RBC UA: 1 /HPF (ref <=3)
SPECIFIC GRAVITY UA: 1.04 — ABNORMAL HIGH (ref 1.003–1.030)
SQUAMOUS EPITHELIAL: <1 /HPF (ref 0–5)
UROBILINOGEN UA: 2 — AB
WBC UA: 1 /HPF (ref <=2)

## 2023-09-14 LAB — COMPREHENSIVE METABOLIC PANEL
ALBUMIN: 4.8 g/dL (ref 3.4–5.0)
ALKALINE PHOSPHATASE: 83 U/L (ref 46–116)
ALT (SGPT): 37 U/L (ref 10–49)
ANION GAP: 15 mmol/L — ABNORMAL HIGH (ref 5–14)
AST (SGOT): 24 U/L (ref <=34)
BILIRUBIN TOTAL: 0.8 mg/dL (ref 0.3–1.2)
BLOOD UREA NITROGEN: 13 mg/dL (ref 9–23)
BUN / CREAT RATIO: 14
CALCIUM: 10.3 mg/dL (ref 8.7–10.4)
CHLORIDE: 103 mmol/L (ref 98–107)
CO2: 21.7 mmol/L (ref 20.0–31.0)
CREATININE: 0.95 mg/dL (ref 0.73–1.18)
EGFR CKD-EPI (2021) MALE: 89 mL/min/1.73m2 (ref >=60)
GLUCOSE RANDOM: 111 mg/dL (ref 70–179)
POTASSIUM: 4.1 mmol/L (ref 3.4–4.8)
PROTEIN TOTAL: 8.6 g/dL — ABNORMAL HIGH (ref 5.7–8.2)
SODIUM: 140 mmol/L (ref 135–145)

## 2023-09-14 NOTE — Unmapped (Signed)
 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 Endorses two episodes of emesis and consistent nausea. Also c/o generalized abd pain.

## 2023-09-14 NOTE — Unmapped (Unsigned)
 Assessment:   This is a 63 y.o.-year-old male with a history of Ulcerative pancolitis S/P colectomy with an end ileostomy 06/29/20 and enlarged prostate who presents for evaluation of bothersome mixed lower urinary tract symptoms suggestive of symptomatic BPH. I reviewed with the patient today his urinary complaints, symptoms and evaluation in the clinic. His postvoid residual in clinic is 28 mls.     We discussed causes of LUTs including abnormalities of the prostate/bladder/urethra, infections, neurologic disease, obstruction and detrusor over/under activity.  We also discussed the role that medications, bladder irritants, fluid intake, constipation, edema and OSA can play in urinary tract symptoms.    We discussed the importance of behavioral modifications including timed and double voiding, avoiding constipation episodes, limiting intake of bladder irritant foods and beverages such as caffeine . As well as shifting fluid intake earlier in the day to decrease nocturia at night.   We discussed the utility of medication therapy such as alpha blockers and 5 alpha reductase inhibitors as well as risks and benefits. Also discussed potential for OAB mediation for irritative symptoms with risks and benefits.     We discussed further evaluation including a local cystoscopy procedure and imaging to evaluate size of the prostate to determine if more advance treatments may be useful including surgical options for prostatic hypertrophy (would not be able to perform TRUS (r/t PMH of rectum removal).     Through shared decision-making Kevin Tran would like to start Silodosin  8 mg daily (caution dizziness); deferred Flomax use related to concerns for dizziness  I will  check a PSA today and he will follow-up in 3 months to reassess symptoms. We will be happy to see him back anytime sooner if symptoms arise.  All questions were answered.  It was a pleasure seeing this patient in clinic today.      Plan:  - PSA today  - Start Silodosin  8 mg daily; caution dizziness  - Behavioral modifications    Follow up to clinic in 3 months for symptom check with PVR     Options further management/eval:    ===========================================================================================  Darrick Peck, MD  [address]  [address]  [place],  [name] [address]    CHIEF COMPLAINT: History of BPH and bothersome lower urinary tract symptoms here for evaluation.    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     Today he reports doing well.   The patient notes urinary complaints for a prolonged amount of time. He admits to history of an enlarged prostate but has not had any recent follow for this. He has previously been maintained on medication therapy including Flomax although no longer taking because he ran out, feels like it was effective when he was taking it. His primary urinary complaints include worsening obstructive symptoms including straining, hesitancy and weak stream for several months; some urgency and freqency going hourly during the day; without leakage ; admits to feeling like he is not fully emptying   Nocturia X1-2/night although he has to get up regardless of urination to empty his colostomy   He is S/P extensive colon surgery with permanent colostomy and rectum removal     The patient denies a history of dysuria, hematuria or urinary tract infection. He  has never had a prostate biopsy in the past. He has no family history of prostate cancer. He does not have a history of sleep apnea or significant snoring. He denies anticoagulant use.  He denies history of urinary tract infections.  He denies fevers, chills, hematuria, pyuria, dysuria, urinary retention, chest pain, and shortness of breath.    - Currently tx with:Nothing, Flomax in past with success but did not restart it when he ran out   - Hx of prostatitis : none   - BM:  Permanent ostomy; follows with GI, difficulties with liquid stool and gas   - Fluids:  Drinks lots of water 3-5 bottle/day ; occasional beer only 1 on occasion; 1 coffee in am, 2-3 sodas/week     Has history of T use with androgel no longer using; no interest in further discussing this today.   Does admit to taking a supplement for his prostate Supabeta prostate   Notes occasional positional dizziness, worries about starting anything that could make this worse     IPSS  Incomplete Emptying: 4   Frequency: 4  Intermittency: 4  Urgency: 5  Weak stream: 5  Straining: 4  Nocturia: 2     Total: 28  (0-7 mild, 8-19 moderate, 20-35 severe)  4 - Mostly Displeased    PVR 28 mls  DIP Negative     No piror PSA hx     Interval history 09/17/23:   Since our last visit 4/30 he reports ***    Started Silodosin  for LUTs with *** symptom relief with *** adverse effects.       Patient denies any flank pain, chills, fever, N/V, dysuria, gross hematuria, bone pain, or any other somatic complaints.   PMH reviewed and unchanged. ***    PAST MEDICAL HISTORY:     Past Medical History:   Diagnosis Date    Arthritis     Pneumothorax     Ulcerative colitis           PAST SURGICAL HISTORY:   Past Surgical History:   Procedure Laterality Date    BACK SURGERY  1996    COLON SURGERY      COLONOSCOPY      LUNG SURGERY      PR COLONOSCOPY W/BIOPSY SINGLE/MULTIPLE Left 07/01/2018    Procedure: COLONOSCOPY, FLEXIBLE, PROXIMAL TO SPLENIC FLEXURE; WITH BIOPSY, SINGLE OR MULTIPLE;  Surgeon: Rodger Phlegm, MD;  [facility]: [facility];  Service: Gastroenterology    PR COLONOSCOPY W/BIOPSY SINGLE/MULTIPLE  10/20/2019    Procedure: COLONOSCOPY, FLEXIBLE, PROXIMAL TO SPLENIC FLEXURE; WITH BIOPSY, SINGLE OR MULTIPLE;  Surgeon: Dorn Lynwood Lauth, MD;  Location: [facility]ED[facility];  Service: Gastroenterology    PR LAP, SURG ENTEROLYSIS Midline 06/29/2020    Procedure: ROBOTIC XI LAPAROSCOPY, SURGICAL, ENTEROLYSIS (FREEING OF INTESTINAL ADHESION) (SEPARATE PROCEDURE);  Surgeon: Aloysius Euell Quale, MD;  Location: MAIN OR [facility];  Service: Gastrointestinal    PR LAP, SURG ENTEROLYSIS Midline 12/12/2020    Procedure: ROBOTIC LAPAROSCOPY, SURGICAL, ENTEROLYSIS (FREEING OF INTESTINAL ADHESION) (SEPARATE PROCEDURE);  Surgeon: Aloysius Euell Quale, MD;  Location: MAIN OR [facility];  Service: Gastrointestinal    PR LAP, SURG PROCTECTOMY W COLOSTOMY Midline 12/12/2020    Procedure: ROBOTIC XI LAPAROSCOPY, SURGICAL; PROCTECTOMY, COMPLETE, COMBINED ABDOMINOPERINEAL, WITH COLOSTOMY;  Surgeon: Aloysius Euell Quale, MD;  Location: MAIN OR [facility];  Service: Gastrointestinal    PR LAP,SURG,COLECTOMY,TOTAL,W/O PROCTECTOMY N/A 06/29/2020    Procedure: ROBOTIC XI LAPAROSCOPY, SURGICAL; COLECTOMY, TOTAL, ABDOMINAL, WITHOUT PROCTECTOMY, WITH ILEOSTOMY  OR ILEOPROCTOSTOMY;  Surgeon: Aloysius Euell Quale, MD;  Location: MAIN OR [facility];  Service: Gastrointestinal    PR RESECT SMALL INTEST,SINGL RESEC/ANAS Midline 12/12/2020    Procedure: ENTERECTOMY SM INTES; SNGL RESECT & ANASTOM;  Surgeon: Aloysius Euell Quale, MD;  Location: MAIN OR [facility];  Service: Gastrointestinal    PR SIGMOIDOSCOPY,BIOPSY N/A 03/28/2020    Procedure: SIGMOIDOSCOPY, FLEXIBLE; WITH BIOPSY, SINGLE OR MULTIPLE;  Surgeon: Dorn Lynwood Lauth, MD;  Location: [facility] OR [facility];  Service: Gastroenterology       ALLERGIES:    has no known allergies.    MEDICATIONS:  Current Outpatient Medications   Medication Sig Dispense Refill    acetaminophen  (TYLENOL ) 500 MG tablet Take 2 tablets (1,000 mg total) by mouth every six (6) hours as needed for pain.      cyclobenzaprine  (FLEXERIL ) 5 MG tablet Take 1 tablet (5 mg total) by mouth nightly as needed for muscle spasms. 30 tablet 2    desipramine  (NORPRAMIN ) 10 MG tablet Take 1 tablet PO at night for 1-2 weeks.  If tolerated, increase to 2 tablets PO at night and continue. 60 tablet 2    folic acid  (FOLVITE ) 1 MG tablet TAKE 1 TABLET(1 MG) BY MOUTH DAILY 90 tablet 1    HYDROcodone -acetaminophen  (NORCO ) 5-325 mg per tablet Take 1 tablet by mouth daily as needed for pain. DNF 08/19/23 30 tablet 0    [START ON 09/28/2023] HYDROcodone -acetaminophen  (NORCO ) 5-325 mg per tablet Take 1 tablet by mouth daily as needed for pain. Fill on or after: 09/28/23 30 tablet 0    [START ON 10/28/2023] HYDROcodone -acetaminophen  (NORCO ) 5-325 mg per tablet Take 1 tablet by mouth daily as needed for pain. Fill on or after: 10/28/23 30 tablet 0    multivitamin (TAB-A-VITE/THERAGRAN) per tablet Take 1 tablet by mouth in the morning.      omeprazole  (PRILOSEC  OTC) 20 MG tablet Take 1 tablet (20 mg total) by mouth in the morning.      pregabalin  (LYRICA ) 225 MG capsule Take 1 capsule (225 mg total) by mouth two (2) times a day. 60 capsule 2    silodosin  (RAPAFLO ) 8 mg cap Take 1 capsule (8 mg total) by mouth in the morning. 90 capsule 3    tumeric-ging-olive-oreg-capryl 100 mg-150 mg- 50 mg-150 mg cap Take by mouth.       No current facility-administered medications for this visit.        SOCIAL HISTORY:  Social History     Tobacco Use    Smoking status: Former     Current packs/day: 0.00     Types: Cigarettes     Quit date: 2006     Years since quitting: 19.6     Passive exposure: Never    Smokeless tobacco: Never   Vaping Use    Vaping status: Never Used   Substance Use Topics    Alcohol use: Yes    Drug use: No        FAMILY HISTORY:  Negative for prostate cancer disease.  Family History   Problem Relation Age of Onset    Cancer Mother         lung       REVIEW OF SYSTEMS:  A 10-system review of systems was completed by the patient and reviewed by me today.  All pertinent positives were discussed in history of present illness.    PHYSICAL EXAMINATION:  ***      VITAL SIGNS:  The patient is afebrile.  Vital signs  are stable.  GENERAL:  In no apparent distress.  HEENT:  Eyes, ears and mouth appeared normal.  LYMPHATICS:  No cervical lymphadenopathy.  LUNGS:  Chest wall excursion symmetric bilaterally. No audible wheezing  ABDOMEN:  Soft, nontender, non distended; colostomy in place  BACK:  Negative CVA tenderness.     DRE: Unable to perform r/t PMH s/f abdominal colectomy with end ileostomy w/ rectum removal  EXTREMITIES:  No lower extremity edema.  SKIN:  No rashes or jaundice.  NEUROLOGIC:  No focal deficits on neurologic exam.    LABORATORY TESTING:      Chemistry        Component Value Date/Time    NA 140 04/15/2023 1001    NA 140 08/04/2019 1056    K 4.0 04/15/2023 1001    K 4.8 08/04/2019 1056    CL 105 04/15/2023 1001    CL 104 08/04/2019 1056    CO2 24.7 04/15/2023 1001    CO2 24 08/04/2019 1056    BUN 13 04/15/2023 1001    BUN 14 08/04/2019 1056    CREATININE 0.73 04/15/2023 1001    CREATININE 1.1 03/16/2021 1548    GLU 95 04/15/2023 1001        Component Value Date/Time    CALCIUM  10.1 04/15/2023 1001    CALCIUM  10.0 08/04/2019 1056    ALKPHOS 70 04/15/2023 1001    ALKPHOS 58 08/04/2019 1056    AST 20 04/15/2023 1001    AST 21 09/10/2021 0000    ALT 31 04/15/2023 1001    ALT 36 09/10/2021 0000    BILITOT 0.7 04/15/2023 1001    BILITOT 0.6 08/04/2019 1056

## 2023-09-15 ENCOUNTER — Emergency Department
Admit: 2023-09-15 | Discharge: 2023-09-15 | Disposition: A | Discharge status: Home / Self Care | Payer: BLUE CROSS/BLUE SHIELD | Attending: Student in an Organized Health Care Education/Training Program

## 2023-09-15 MED ADMIN — ondansetron (ZOFRAN) injection 4 mg: 4 mg | INTRAVENOUS | @ 01:00:00 | Stop: 2023-09-14

## 2023-09-15 MED ADMIN — iohexol (OMNIPAQUE) 350 mg iodine/mL solution 100 mL: 100 mL | INTRAVENOUS | @ 07:00:00 | Stop: 2023-09-15

## 2023-09-15 MED ADMIN — loperamide (IMODIUM) capsule 4 mg: 4 mg | ORAL | @ 05:00:00 | Stop: 2023-09-15

## 2023-09-15 MED ADMIN — lactated ringers bolus 1,000 mL: 1000 mL | INTRAVENOUS | @ 04:00:00 | Stop: 2023-09-15

## 2023-09-15 MED ADMIN — dicyclomine (BENTYL) tablet 20 mg: 20 mg | ORAL | @ 05:00:00 | Stop: 2023-09-15

## 2023-09-15 NOTE — Unmapped (Signed)
 [facility]  Emergency Department Provider Note     ED Clinical Impression     Final diagnoses:   Diarrhea, unspecified type (Primary)      HPI, Medical Decision Making, ED Course     History of Present Illness  Kevin Tran is a 63 year old male with past medical history of ulcerative colitis status post total colectomy who presents with concern for increased ostomy output and dehydration.  He is accompanied by his wife.    Over the last day, patient reports that he has experienced increased colostomy output.  He woke up the day of arrival to the emergency department stating that he thought that his bag was going to blow and the output has continued to be robust despite Imodium , Pepto-Bismol, and low fiber foods.  He had a brief episode of lower abdominal pain resembling the urge to defecate lasting 5 to 10 minutes before subsiding.  He also reports headache and overall feeling dehydrated.  He denies that there is any blood in the colostomy bag.  He denies any fever/chills.  No recent travel.  No sick contacts.  No urinary symptoms.    Medical Decision Making  63 year old male with a history of total colectomy presented with acute onset of high ostomy output, watery in character, associated with dehydration, headache, nausea, vomiting, and transient rectal/anal spasms. He reported no recent travel, sick contacts, or blood in the ostomy output.  Urinalysis obtained on arrival to the emergency department with elevated spec gravity of 1.040, consistent with dehydration, no evidence of urinary tract infection.      High ostomy output with dehydration, nausea, vomiting, headache, leg cramps, and rectal/anal spasms  - Consider viral gastroenteritis, bacterial infection, C. difficile,, colectomy complication  - Administered IV fluids for dehydration  - Order stool studies to investigate cause of high output  - Order CT scan to evaluate colostomy  - Administered antispasmodics for rectal/anal spasms        ED Course  ED Course as of 09/15/23 0451   Mon Sep 15, 2023   0439 CBC notable for very mild leukocytosis of 12.6 with left shift, suspect likely in setting of ongoing diarrhea/GI losses.  COVID/flu/RSV testing negative.  CMP notable for slightly elevated anion gap to 15.    CT scan obtained, independently interpreted, without any evidence of acute intra-abdominal abnormality, patient with ongoing postsurgical changes of total colectomy and right lower quadrant end ileostomy.    I have reevaluated patient after administration of IV fluids, Bentyl , Imodium , and Zofran  here in the emergency department.  He reports feeling improved.  I discussed with patient that we would not have the stool studies back here in the emergency department today but that we would call him if any of the results were positive.  Given otherwise reassuring workup here in the emergency department and improvement in symptoms, he feels comfortable with plan for discharge.  He will plan to follow-up with his outpatient GI.  Given strict return precautions and discharged in stable condition.       Discussion of Management with other Physicians, QHP or Appropriate Source: None  Independent Interpretation of Studies: If applicable, documented in ED course above.  I have reviewed recent and relevant previous record, including: Outpatient notes - pain management clinic visit from 08/29/2023 to review past medical history  Escalation of Care including OBS/Admission/Transfer was considered: However, patient was determined to be appropriate for outpatient management.          Additional  History Elements     Chief Complaint  Chief Complaint   Patient presents with    Abdominal Pain     Additional Historian(s): the patient's spouse    Past Medical History[1]    Past Surgical History[2]    Active Medications[3]     Allergies[4]    Family History[5]    Short Social History[6]     Physical Exam     VITAL SIGNS:      Vitals:    09/14/23 2049 09/15/23 0058   BP: 100/72 121/77   Pulse: 89 59   Resp: 18 18   Temp: 36.7 ??C (98 ??F) 36.6 ??C (97.9 ??F)   TempSrc:  Oral   SpO2: 99%      Constitutional: Alert and oriented. No acute distress.  Eyes: Conjunctivae are normal.  HEENT: Normocephalic and atraumatic. No congestion. Moist mucous membranes.   Cardiovascular: Rate as above, regular rhythm. Normal and symmetric distal pulses.  Respiratory: Normal respiratory effort. Breath sounds are normal. There are no wheezing or crackles heard.  Gastrointestinal: Soft, non-distended, non-tender.  Genitourinary: Deferred.  Musculoskeletal: Non-tender with normal range of motion in all extremities.  Neurologic: Normal speech and language. No gross focal neurologic deficits are appreciated. Patient is moving all extremities equally, face is symmetric at rest and with speech.  Skin: Skin is warm, dry and intact. No rash noted.  Psychiatric: Mood and affect are normal. Speech and behavior are normal.     Radiology     CT Abdomen Pelvis W IV Contrast Only   Preliminary Result   --No acute abdominopelvic abnormality.   --Redemonstrated postsurgical change of total colectomy with right lower quadrant end ileostomy.   --Chronic and incidental findings as detailed in the body the report.             Portions of this record have been created using Scientist, clinical (histocompatibility and immunogenetics). Dictation errors have been sought, but may not have been identified and corrected.           [1]   Past Medical History:  Diagnosis Date    Arthritis     Pneumothorax     Ulcerative colitis       [2]   Past Surgical History:  Procedure Laterality Date    BACK SURGERY  1996    COLON SURGERY      COLONOSCOPY      LUNG SURGERY      PR COLONOSCOPY W/BIOPSY SINGLE/MULTIPLE Left 07/01/2018    Procedure: COLONOSCOPY, FLEXIBLE, PROXIMAL TO SPLENIC FLEXURE; WITH BIOPSY, SINGLE OR MULTIPLE;  Surgeon: Rodger Phlegm, MD;  [facility]: [facility];  Service: Gastroenterology    PR COLONOSCOPY W/BIOPSY SINGLE/MULTIPLE  10/20/2019 Procedure: COLONOSCOPY, FLEXIBLE, PROXIMAL TO SPLENIC FLEXURE; WITH BIOPSY, SINGLE OR MULTIPLE;  Surgeon: Dorn Lynwood Lauth, MD;  Location: [facility];  Service: Gastroenterology    PR LAP, SURG ENTEROLYSIS Midline 06/29/2020    Procedure: ROBOTIC XI LAPAROSCOPY, SURGICAL, ENTEROLYSIS (FREEING OF INTESTINAL ADHESION) (SEPARATE PROCEDURE);  Surgeon: Aloysius Euell Quale, MD;  Location: MAIN OR [facility];  Service: Gastrointestinal    PR LAP, SURG ENTEROLYSIS Midline 12/12/2020    Procedure: ROBOTIC LAPAROSCOPY, SURGICAL, ENTEROLYSIS (FREEING OF INTESTINAL ADHESION) (SEPARATE PROCEDURE);  Surgeon: Aloysius Euell Quale, MD;  Location: MAIN OR [facility];  Service: Gastrointestinal    PR LAP, SURG PROCTECTOMY W COLOSTOMY Midline 12/12/2020    Procedure: ROBOTIC XI LAPAROSCOPY, SURGICAL; PROCTECTOMY, COMPLETE, COMBINED ABDOMINOPERINEAL, WITH COLOSTOMY;  Surgeon: Aloysius Euell Quale, MD;  Location: MAIN OR  [facility];  Service: Gastrointestinal    PR LAP,SURG,COLECTOMY,TOTAL,W/O PROCTECTOMY N/A 06/29/2020    Procedure: ROBOTIC XI LAPAROSCOPY, SURGICAL; COLECTOMY, TOTAL, ABDOMINAL, WITHOUT PROCTECTOMY, WITH ILEOSTOMY OR ILEOPROCTOSTOMY;  Surgeon: Aloysius Euell Quale, MD;  Location: MAIN OR [facility];  Service: Gastrointestinal    PR RESECT SMALL INTEST,SINGL RESEC/ANAS Midline 12/12/2020    Procedure: ENTERECTOMY SM INTES; SNGL RESECT & ANASTOM;  Surgeon: Aloysius Euell Quale, MD;  Location: MAIN OR [place];  Service: Gastrointestinal    PR SIGMOIDOSCOPY,BIOPSY N/A 03/28/2020    Procedure: SIGMOIDOSCOPY, FLEXIBLE; WITH BIOPSY, SINGLE OR MULTIPLE;  Surgeon: Dorn Lynwood Lauth, MD;  [facility]: [facility] OR [facility];  Service: Gastroenterology   [3]   No current facility-administered medications for this encounter.     Current Outpatient Medications   Medication Sig Dispense Refill    acetaminophen  (TYLENOL ) 500 MG tablet Take 2 tablets (1,000 mg total) by mouth every six (6) hours as needed for pain.      cyclobenzaprine  (FLEXERIL ) 5 MG tablet Take 1 tablet (5 mg total) by mouth nightly as needed for muscle spasms. 30 tablet 2    desipramine  (NORPRAMIN ) 10 MG tablet Take 1 tablet PO at night for 1-2 weeks.  If tolerated, increase to 2 tablets PO at night and continue. 60 tablet 2    folic acid  (FOLVITE ) 1 MG tablet TAKE 1 TABLET(1 MG) BY MOUTH DAILY 90 tablet 1    HYDROcodone -acetaminophen  (NORCO ) 5-325 mg per tablet Take 1 tablet by mouth daily as needed for pain. DNF 08/19/23 30 tablet 0    [START ON 09/28/2023] HYDROcodone -acetaminophen  (NORCO ) 5-325 mg per tablet Take 1 tablet by mouth daily as needed for pain. Fill on or after: 09/28/23 30 tablet 0    [START ON 10/28/2023] HYDROcodone -acetaminophen  (NORCO ) 5-325 mg per tablet Take 1 tablet by mouth daily as needed for pain. Fill on or after: 10/28/23 30 tablet 0    multivitamin (TAB-A-VITE/THERAGRAN) per tablet Take 1 tablet by mouth in the morning.      omeprazole  (PRILOSEC  OTC) 20 MG tablet Take 1 tablet (20 mg total) by mouth in the morning.      pregabalin  (LYRICA ) 225 MG capsule Take 1 capsule (225 mg total) by mouth two (2) times a day. 60 capsule 2    silodosin  (RAPAFLO ) 8 mg cap Take 1 capsule (8 mg total) by mouth in the morning. 90 capsule 3    tumeric-ging-olive-oreg-capryl 100 mg-150 mg- 50 mg-150 mg cap Take by mouth.     [4] No Known Allergies  [5]   Family History  Problem Relation Age of Onset    Cancer Mother         lung   [6]   Social History  Tobacco Use    Smoking status: Former     Current packs/day: 0.00     Types: Cigarettes     Quit date: 2006     Years since quitting: 19.6     Passive exposure: Never    Smokeless tobacco: Never   Vaping Use    Vaping status: Never Used   Substance Use Topics    Alcohol use: Yes    Drug use: No        Jerrilyn Marsa Loving, MD  09/15/23 0451

## 2023-09-15 NOTE — Unmapped (Signed)
 Wife LVM about pt being in the ED for dehydration and concerns about his ostomy bag,

## 2023-09-16 ENCOUNTER — Encounter: Admit: 2023-09-16 | Discharge: 2023-09-17 | Payer: BLUE CROSS/BLUE SHIELD

## 2023-09-16 DIAGNOSIS — A0811 Acute gastroenteropathy due to Norwalk agent: Principal | ICD-10-CM

## 2023-09-16 DIAGNOSIS — Z932 Ileostomy status: Principal | ICD-10-CM

## 2023-09-16 DIAGNOSIS — K51919 Ulcerative colitis, unspecified with unspecified complications: Principal | ICD-10-CM

## 2023-09-16 DIAGNOSIS — R198 Other specified symptoms and signs involving the digestive system and abdomen: Principal | ICD-10-CM

## 2023-09-16 NOTE — Unmapped (Signed)
 per dr darrick schedule televisit today at 12:00, pt agreed, notified dr. darrick of CT, stool test, fluids, and pt has tried immodium

## 2023-09-16 NOTE — Unmapped (Signed)
 [facility]   FOLLOW UP NOTE - INFLAMMATORY BOWEL DISEASE  09/16/2023    Demographics:  Kevin Tran is a 63 y.o. year old male    Diagnosis:  Ulcerative Colitis  Disease onset (yr):  2018  Age at onset:  > 20yr old (A3)  Location:  Left-sided (E2)  Behavior:  Colitis  s/p TAC with end ileostomy          HPI / NOTE :     Video Visit    Interval Events:   1.  Went to [facility] ER for loose watery ostomy output and dizziness. I reviewed ER notes, blood work and stool samples from the ER. Stool sample showed (+) norovirus. CBC and CMP reviewed, likely mild hemoconcentration.     HPI:   Woke up Sunday night with intense, loose, watery stools.  Filling up the ostomy bag, very high output.  Later that day started developing nausea and vomiting and stomach cramping. Felt dizzy, lightheaded and went to the ER.     Today, feeling little better.  Ostomy output is still loose and slowing down some.  Has emptied 3-4x/day already today. Has drinking liquid IV, powerade and gatorade.     Review of Systems:   Review of systems positive for: fatigue, dehydration    Otherwise, the balance of 10 systems is negative           IBD HISTORY:     Year of disease onset:  2018    Brief IBD Disease Course:  In late 2018 developed bloody diarrhea and incontinence. Colonoscopy in Feb 2019 showed edema and erythema in the rectum and sigmoid with decreased vascularity in the rest of the colon; the TI appeared normal. He was treated with courses of prednisone  for much of 2019 and also put on mesalamine  without improvement. In Fall 2019 he was started on Humira  w/o improvement. In Dec 2019 he had moderate level antibodies to humira ; 6MP was added; mesalamine  was stopped; put on prednisone  taper; rapidly improved  - Jan 2020 - stopped mesalamine , used weekly Humira  + with initial improvement.   - April 2020 - notable worsening of diarrhea and abdominal pain  Colonoscopy with moderate inflammation mainly in rectum.  Changed to Xeljanz  06/2018.  Had limited response initially, but improved with Uceris  foam 09/2018.    - 09/2018 - taper to xeljanz  5mg  BID.  Nov 2020 - ongoing proctitis symptoms, restarted Uceris  foam.   - 06/2019 - severe UC flare on Xeljanz , plan to restage disease and change therapy  - 10/2019 - moderate proctitis (up to ~20cm), started canasa  + Stelara .  Good symptom response to Stelara  initially, but had severe arthralgias.   - Feb 2022 - worsening UC flare despite Stelara .  Admitted to [facility], discharged on prednisone  taper.  Recurrent symptoms, readmitted 1 week later, started on IV Remicade  5mg /kg in the hospital on 04/06/2020. Low trough level April 2022 - increased dose to 10mg /kg.    - May 2022 - poor response to high dose Remicade .  Colectomy 06/29/2020 ([facility]). Surg path - severe chronic active colitis c/w UC.- Nov 2022 - attempted ostomy takedown and IPAA but had intra-op mesenteric vessel rupture and IPAA was aborted (left with end ileostomy).     Endoscopy:      - Colonoscopy Feb 2019: edema + erythema in rectum and sigmoid; decreased vascularity in rest of colon  - Dec 2019: moderate inflammation throughout the colon  - Colonoscopy 07/01/2018 - poor prep,  moderate proctitis up to 15cm, mild patchy nodularity in remaining colon.  Normal terminal ileum.   - Colonoscopy 10/20/2019 - hemorrhoids.  Moderate (Mayo 2) colitis from anal verge up to 10cm.  Mild colitis from 10-20cm, then normal colon.  Normal terminal ileum.    PATH:  Moderately active chronic colitis  - Sigmoidoscopy 03/28/2020 - moderate (Mayo 2) colitis through the extent of the exam (up to the descending colon at least).    PATH:  Severe chronic active colitis with no CMV    Imaging:    - CT 01/28/18: left sided colitis; liver lesion (see below)  - MRI abdomen to evaluate liver lesion 02/09/18 - aforementioned liver lesion felt to be focal fatty infiltration;  6 month repeat recommended    Prior IBD medications (type, dose, duration, response):  [x]  5-ASAs - Mesalamine   - no improvement; possible paradoxical diarrhea  [x]  Oral corticosteroids - prednisone .  Good response with budesonide  foam.   []  Intravenous corticosteroids  []  Antibiotics  [x]  Thiopurines: started in Dec 2019  []  Methotrexate   [x]  Anti-TNF therapies - Humira  since Sept or Oct 2019. Low level and low titer Ab in Dec 2019. Increased to weekly dosing. Stopped spring 2020 due to non-response despite good drug levels.   []  Anti-Integrin therapies  []  Anti-Interleukin therapies  [x]  Anti-JAK therapies - Xeljanz  10mg  started 06/2018, tapered to 5mg  bid 09/2018  []  Cyclosporine  []  Clinical trial medication  []  Other (Please specify):    Extraintestinal manifestations:   -joint pains affecting: y, peripheral arthralgias  -eye: n  -skin: n  -oral ulcers :  n  -blood clots: n  -PSC: n  -other: n          Past Medical History:   Past medical history:   Past Medical History:   Diagnosis Date    Arthritis     Pneumothorax     Ulcerative colitis         Past surgical history:   Past Surgical History:   Procedure Laterality Date    BACK SURGERY  1996    COLON SURGERY      COLONOSCOPY      LUNG SURGERY      PR COLONOSCOPY W/BIOPSY SINGLE/MULTIPLE Left 07/01/2018    Procedure: COLONOSCOPY, FLEXIBLE, PROXIMAL TO SPLENIC FLEXURE; WITH BIOPSY, SINGLE OR MULTIPLE;  Surgeon: Rodger Phlegm, MD;  Location: [facility] PROCEDURES [facility];  Service: Gastroenterology    PR COLONOSCOPY W/BIOPSY SINGLE/MULTIPLE  10/20/2019    Procedure: COLONOSCOPY, FLEXIBLE, PROXIMAL TO SPLENIC FLEXURE; WITH BIOPSY, SINGLE OR MULTIPLE;  Surgeon: Dorn Lynwood Lauth, MD;  Location: [facility] PROC[facility];  Service: Gastroenterology    PR LAP, SURG ENTEROLYSIS Midline 06/29/2020    Procedure: ROBOTIC XI LAPAROSCOPY, SURGICAL, ENTEROLYSIS (FREEING OF INTESTINAL ADHESION) (SEPARATE PROCEDURE);  Surgeon: Aloysius Euell Quale, MD;  Location: MAIN OR [facility];  Service: Gastrointestinal    PR LAP, SURG ENTEROLYSIS Midline 12/12/2020    Procedure: ROBOTIC LAPAROSCOPY, SURGICAL, ENTEROLYSIS (FREEING OF INTESTINAL ADHESION) (SEPARATE PROCEDURE);  Surgeon: Aloysius Euell Quale, MD;  Location: MAIN OR [facility];  Service: Gastrointestinal    PR LAP, SURG PROCTECTOMY W COLOSTOMY Midline 12/12/2020    Procedure: ROBOTIC XI LAPAROSCOPY, SURGICAL; PROCTECTOMY, COMPLETE, COMBINED ABDOMINOPERINEAL, WITH COLOSTOMY;  Surgeon: Aloysius Euell Quale, MD;  Location: MAIN OR [place];  Service: Gastrointestinal    PR LAP,SURG,COLECTOMY,TOTAL,W/O PROCTECTOMY N/A 06/29/2020    Procedure: ROBOTIC XI LAPAROSCOPY, SURGICAL; COLECTOMY, TOTAL, ABDOMINAL, WITHOUT PROCTECTOMY, WITH ILEOSTOMY OR ILEOPROCTOSTOMY;  Surgeon: Aloysius Euell Quale, MD;  Location: MAIN OR [facility];  Service: Gastrointestinal    PR RESECT SMALL INTEST,SINGL RESEC/ANAS Midline 12/12/2020    Procedure: ENTERECTOMY SM INTES; SNGL RESECT & ANASTOM;  Surgeon: Aloysius Euell Quale, MD;  Location: MAIN OR [facility];  Service: Gastrointestinal    PR SIGMOIDOSCOPY,BIOPSY N/A 03/28/2020    Procedure: SIGMOIDOSCOPY, FLEXIBLE; WITH BIOPSY, SINGLE OR MULTIPLE;  Surgeon: Dorn Lynwood Lauth, MD;  Location: [facility];  Service: Gastroenterology     Family history:   Family History   Problem Relation Age of Onset    Cancer Mother         lung     Social history:   Social History     Socioeconomic History    Marital status: Married   Tobacco Use    Smoking status: Former     Current packs/day: 0.00     Types: Cigarettes     Quit date: 2006     Years since quitting: 19.6     Passive exposure: Never    Smokeless tobacco: Never   Vaping Use    Vaping status: Never Used   Substance and Sexual Activity    Alcohol use: Yes    Drug use: No    Sexual activity: Yes     Partners: Female   Social History Narrative    Self employed, works as a Nutritional therapist            Allergies:   No Known Allergies          Medications:     Current Outpatient Medications   Medication Instructions    acetaminophen  (TYLENOL ) 1,000 mg, Every 6 hours PRN    cyclobenzaprine  (FLEXERIL ) 5 mg, Oral, Nightly PRN    desipramine  (NORPRAMIN ) 10 MG tablet Take 1 tablet PO at night for 1-2 weeks.  If tolerated, increase to 2 tablets PO at night and continue.    folic acid  (FOLVITE ) 1 mg, Oral    HYDROcodone -acetaminophen  (NORCO ) 5-325 mg per tablet 1 tablet, Oral, Daily PRN, DNF 08/19/23    [START ON 09/28/2023] HYDROcodone -acetaminophen  (NORCO ) 5-325 mg per tablet 1 tablet, Oral, Daily PRN, Fill on or after: 09/28/23    [START ON 10/28/2023] HYDROcodone -acetaminophen  (NORCO ) 5-325 mg per tablet 1 tablet, Oral, Daily PRN, Fill on or after: 10/28/23    multivitamin (TAB-A-VITE/THERAGRAN) per tablet 1 tablet, Daily (standard)    omeprazole  (PRILOSEC  OTC) 20 mg, Daily (standard)    pregabalin  (LYRICA ) 225 mg, Oral, 2 times a day (standard)    silodosin  (RAPAFLO ) 8 mg, Oral, Daily    tumeric-ging-olive-oreg-capryl 100 mg-150 mg- 50 mg-150 mg cap Take by mouth.           Physical Exam:   There were no vitals taken for this visit.  VIDEO VISIT - no vitals and limited physical exam    GEN: in no apparent distress, appears comfortable on exam  HEENT: eyes normal and symmetric  NEURO:  Normal speech, face symmetric  PULM:  Respirations comfortable  SKIN:  No visible rash on face/neck  Psych: affect appropriate, A&O x3          Labs, Data & Indices:     Lab Review:   Lab Results   Component Value Date    WBC 12.6 (H) 09/14/2023    WBC 7.0 09/10/2021    WBC 4.9 02/22/2021    RBC 5.83 (H) 09/14/2023    RBC 4.74 02/22/2021    HGB 16.1 09/14/2023    HGB 12.8 (L) 02/22/2021  Lab Results   Component Value Date    AST 24 09/14/2023    AST 21 09/10/2021    ALT 37 09/14/2023    ALT 36 09/10/2021    BUN 13 09/14/2023    BUN 14 08/04/2019    Creatinine Whole Blood, POC 1.1 03/16/2021    Creatinine 0.95 09/14/2023    CO2 21.7 09/14/2023    CO2 24 08/04/2019    Albumin  4.8 09/14/2023    Calcium  10.3 09/14/2023    Calcium  10.0 08/04/2019     Lab Results   Component Value Date    TSH 0.829 04/15/2023         Diagnosis ICD-10-CM Associated Orders   1. Norovirus  A08.11       2. Ulcerative colitis with complication, unspecified location     K51.919       3. High output ileostomy     R19.8     Z93.2               Assessment & Recommendations:   Disease state: ulcerative pancolitis, post-op    DQUAN CORTOPASSI is a 63 y.o. male with ulcerative pancolitis (mostly proctosigmoiditis) since 2018. He was previously treated with Humira  (mechanistic loss of response), Xeljanz  (non-response) and Stelara  (non-response).  He had a severe UC flare in Feb 2022 but had progressive colitis despite starting Infliximab . Hence, we proceeded with surgery (06/29/20) and he is status post colectomy with an end ileostomy.  Unfortunately he had an attempted IPAA surgery in November 2022 but there were intra-operative complications (bleeding, dusky appearance of the pouch) and the pouch creation had to be aborted; most of the recum had been resected at that point. He hence has an end-ileostomy. He met with with Dr. Fleurette for second opinion in 2023, and he decided not to proceed with additional surgery at that time.     Mr. Vinsant was seen acutely today in follow up from recent ER visit.  He has norovirus infection and is already showing some signs of improvement.  I recommended supportive care as detailed below and return precautions to come back to ER if worsening.     Plan:  1.  Continue imodium  as you are doing, ok to take a few extra for the next few days as needed  2.  For food intake, stick with bland, starchy foods for a few days  3.  Please try to use a rehydration solution to help with your fluid intake.  You can use the dilute gatorade + salt mix we discussed or one of the options below.      IBD health maintenance:  Influenza vaccine: given in Fall 2019  Pneumonia vaccine:   Hepatitis B: received twinrix 2 of 3;   TB testing: Quant gold neg in March 2019  Chickenpox/Shingles history: Shingrix  completed 09/2018  Bone denistometry:   Derm appointment:  Last colonoscopy: 10/2019  -----------------------------------------------------------  Author: Rodger Phlegm, MD 09/16/2023 4:22 PM     Rodger Phlegm, MD  Associate Professor of Medicine  Division of Gastroenterology & Hepatology  [employer]  [facility]  ======================================================    The patient reports they are physically located in [place]  and is currently: at home. I conducted a audio/video visit. I spent  35m 53s on the video call with the patient. I spent an additional 6 minutes on pre- and post-visit activities on the date of service .

## 2023-09-16 NOTE — Unmapped (Signed)
 GI Pathogen Panel (instead of Stool O&P)  Order: [id number]   Status: Final result    Test Result Released: Yes (seen)    0 Result Notes       View Follow-Up Encounter      Component  Ref Range & Units (hover) 1 d ago   Adenovirus F 40/41 PCR Not Detected   Astrovirus PCR Not Detected   Norovirus GI/GII PCR Detected Abnormal         Results reviewed and sent to [facility] for review.

## 2023-09-16 NOTE — Unmapped (Signed)
 EMAP test result follow-up note    September 16, 2023 4:15 PM     I was contacted by the ED resource nurse Tereasa Birmingham) with regard to Kevin Tran. He was seen in the [facility] Emergency Department on 09/14/23 at which time a GI Pathogen Panel was done which has returned with the following result:    GI Pathogen Panel (instead of Stool O&P)  Order: [id number]   Status: Final result     Next appt: 11/17/2023 at 09:30 AM in Pain Medicine Sanna KANDICE Leep, FNP)    Test Result Released: Yes (seen)           Component  Ref Range & Units (hover) 09/15/23 0031    Adenovirus F 40/41 PCR Not Detected    Astrovirus PCR Not Detected    Norovirus GI/GII PCR Detected - Abnormal     Rotavirus A PCR Not Detected    Campylobacter PCR Not Detected    E. coli O157 Not Performed    Enteropathogenic E. coli (EPEC) PCR Not Detected    Enterotoxigenic E. coli (ETEC) PCR Not Detected    Plesiomonas shigelloides PCR Not Detected    Salmonella PCR Not Detected    Shiga-like Toxin-Producing E. coli (STEC) PCR Not Detected    Shigella/ Enteroinvasive E. coli (EIEC) PCR Not Detected    Yersinia enterocolitica PCR Not Detected    Cryptosporidium PCR Not Detected    Cyclospora cayetanensis PCR Not Detected    Entamoeba histolytica PCR Not Detected    Giardia lamblia PCR Not Detected   Resulting Agency [facility]        Narrative  Performed by: [facility]  Test performed using the QIAStat-Dx Gastrointestinal Panel 2, an FDA-cleared qualitative molecular multiplex test. Positive results for Campylobacter, Cryptosporidium, Cyclospora, shiga toxin producing E. coli (STEC), Salmonella, and Shigella must be reported by both the physician and laboratory to the [place] Division of Public Health. Full test limitations can be reviewed here: [email or web address].org/[facility]-[email or web address]-[email or web address]/[email or web address]-tests/gi-pathogen-panel/   Specimen Collected: 09/15/23 00:31 Last Resulted: 09/15/23 22:38     I have reviewed the patient's chart from this ED visit. He is a 63 year old male with history of ulcerative colitis s/p total colectomy who was evaluated for vomiting and diarrhea. He was discharged home after symptomatic treatment with GI pathogen panel pending. This has since returned with the findings as documented above.    Acute viral gastroenteritis is usually self-limited and treated with supportive measures. The ED resource nurse will contact the patient, inform him of the positive test result(s) and assess how the patient is doing. He should be encouraged to continue oral rehydration (information added to his After Visit Summary in MyChart) and practice appropriate hand hygiene, especially after using the bathroom and before eating/preparing food. Typical return precautions should be reviewed including instructions to return for persistent fever, severe abdominal pain, grossly bloody diarrhea, inability to tolerate any food or fluid by mouth, vomiting of blood or other concerns. He should otherwise be instructed to follow-up as directed at the time of his ED visit.

## 2023-09-16 NOTE — Unmapped (Signed)
 Kevin Tran it was a pleasure seeing you today.  Here is a summary from today's visit:     1.  Continue imodium  as you are doing, ok to take a few extra for the next few days as needed  2.  For food intake, stick with bland, starchy foods for a few days  3.  Please try to use a rehydration solution to help with your fluid intake.  You can use the dilute gatorade + salt mix we discussed or one of the options below.     Here is some information on the rehydration solution we discussed:     1 Rehydration Solution:      2.  Handout from [employer] [place]   This has some good information including various options like Gatorade, Chicken Broth, Tomato juice, or prune juice based rehydration solutions.     https://med.[email or web address] .edu/ginutrition/wp-content/uploads/sites/199/[email or web address]/09/Homemade-Oral-Rehydration-Solutions-10-2016.pdf          Rodger Phlegm, MD  Associate Professor of Medicine  Division of Gastroenterology & Hepatology  [employer] [facility]        EXPECTATIONS FOR PATIENT FOLLOW UP AND COMMUNICATION:  -- Follow up Appointments:  Crohn's disease and Ulcerative colitis are serious chronic inflammatory diseases which require close monitoring.  I expect to see patients for a follow up visits at least every 6 months (or more often if you are having a flare, starting new therapy, etc).  In select cases, patients who are only on aminosalicylates / mesalamine , may be seen once per year for follow up.  If you are not able to come for follow up appointments we may not be able to refill your medications or continue caring for you.   -- Appointment Type: While we are continuing to offer video appointments in select cases (stable patients who are not in a flare and without new/active symptoms) during the COVID19 pandemic, I expect to see patients in person at least once per year.   -- Communication:  if you have any questions or concerns, you can communicate with us  via phone (contact information below) or via myChart.  Note that phone messages are given higher priority and triaged first. For urgent issues, please contact us  via phone.      IBD NURSE COORDINATOR CONTACT - Duwaine Legions, RN     Phone: [phone] (direct line)      Fax: [phone]  * For urgent medical concerns after hours or on weekends and holidays, call [phone] and ask to speak to the GI Fellow on call.    * If you have a GI medical question or GI symptoms and would like to speak to your provider's healthcare team, please contact Duwaine Legions, RN (contact information above) OR you can send the GI healthcare team a message through MyChart at [email or web address]    [facility] MESSAGES  -- MyChart messages and advice requests are now going to be sent to our clinical care team. A team member will respond to the message or send it to your provider if needed.   -- The message will typically be addressed within 3-4 business days if it is an issue that can be handled by MyChart.   -- For multiple questions, complex concerns, and/or if you have not been by seen by your provider in the recommended follow-up period you may be asked to schedule a clinic visit.   -- MyChart messages are NOT read after hours or on weekends. MyChart is NOT meant for urgent issues or emergencies. For  emergencies call 911 or go to the nearest emergency department.     APPOINTMENT SCHEDULING FOR GI CLINIC AND GI PROCEDURES:  RADIOLOGY - to schedule imaging ordered, please call [phone]  GI PROCEDURES         [phone] option 2   GI MEDICINE CLINIC  [phone] option 1   *To schedule, reschedule, or cancel your GI appointment, please call [phone]. If you are unable to come to an appointment, please notify us  as soon as possible, preferably 24 hours in advance. Doing so may allow other patients with urgent needs to be scheduled in a cancelled appointment slot.     TEST RESULTS   If you have a MyChart account, your new results and a provider message will be sent to you through your MyChart account at [email or web address]. For results that require follow-up, a member of your healthcare team will also contact you.    PRESCRIPTION REFILL REQUESTS  To request prescription refills, please contact your pharmacy or send your healthcare team a message through your MyChart account at [email or web address]  RECORD REQUESTS  For questions related to medical records, please call Medical Records Release of Information at [phone]  FINANCIAL NAVIGATOR   For billing and other financial questions/needs - please call [phone]. If you need to leave a message, please be sure to leave your full name, date of birth or Medical record number, best call back # and reason for call.    For educational material and resources:  [email or web address]    ================================================================

## 2023-09-17 NOTE — Unmapped (Signed)
 I called and spoke with patient regarding GI pathogen panel. I explained to patient it was important to maintain hydration and electrolytes. Patient was notified that this was a contagious virus and hand washing was important after using the restroom and prior to handling food. Patient is aware this is a self limiting virus. Return precautions reviewed. No further questions.

## 2023-09-18 NOTE — Unmapped (Signed)
 Case has been reported to the [place] Department of Health and Health and safety inspector Division of [employer].

## 2023-09-28 MED ORDER — HYDROCODONE 5 MG-ACETAMINOPHEN 325 MG TABLET
ORAL_TABLET | Freq: Every day | ORAL | 0 refills | 30.00000 days | Status: CP | PRN
Start: 2023-09-28 — End: ?

## 2023-10-28 MED ORDER — HYDROCODONE 5 MG-ACETAMINOPHEN 325 MG TABLET
ORAL_TABLET | Freq: Every day | ORAL | 0 refills | 30.00000 days | Status: CP | PRN
Start: 2023-10-28 — End: ?

## 2023-11-16 MED ORDER — DESIPRAMINE 10 MG TABLET
ORAL_TABLET | 2 refills | 0.00000 days
Start: 2023-11-16 — End: ?

## 2023-11-16 MED ORDER — PREGABALIN 225 MG CAPSULE
ORAL_CAPSULE | Freq: Two times a day (BID) | ORAL | 2 refills | 30.00000 days
Start: 2023-11-16 — End: ?

## 2023-11-17 ENCOUNTER — Ambulatory Visit: Admit: 2023-11-17 | Discharge: 2023-11-18 | Payer: BLUE CROSS/BLUE SHIELD

## 2023-11-17 DIAGNOSIS — M79642 Pain in left hand: Principal | ICD-10-CM

## 2023-11-17 DIAGNOSIS — M79641 Pain in right hand: Principal | ICD-10-CM

## 2023-11-17 DIAGNOSIS — G894 Chronic pain syndrome: Principal | ICD-10-CM

## 2023-11-17 DIAGNOSIS — G609 Hereditary and idiopathic neuropathy, unspecified: Principal | ICD-10-CM

## 2023-11-17 DIAGNOSIS — M25531 Pain in right wrist: Principal | ICD-10-CM

## 2023-11-17 DIAGNOSIS — M25532 Pain in left wrist: Principal | ICD-10-CM

## 2023-11-17 DIAGNOSIS — M2550 Pain in unspecified joint: Principal | ICD-10-CM

## 2023-11-17 MED ORDER — PREGABALIN 225 MG CAPSULE
ORAL_CAPSULE | Freq: Two times a day (BID) | ORAL | 2 refills | 30.00000 days | Status: CP
Start: 2023-11-17 — End: ?

## 2023-11-17 NOTE — Unmapped (Signed)
 I was present during controlled medication pill count.  Correct count verified.

## 2023-11-17 NOTE — Unmapped (Signed)
 It was good to see you today.    I have refilled your medications with no changes.    We will see you in 3 months, or sooner if needed.

## 2023-11-17 NOTE — Unmapped (Signed)
 Medication:OXYCODONE  5-325  DPH:UJXZ 1 Q DAY PRN FOR PAIN   Quantity on RX: #30  Filled on:11/10/2023  Pill count today: #23

## 2023-11-17 NOTE — Unmapped (Signed)
 Department of Anesthesiology  [facility]  [address], [address]  [place], [name] [address]  [phone]    Chronic Pain Follow Up Note    1. Chronic pain syndrome    2. Idiopathic peripheral neuropathy    3. Multiple joint pain    4. Bilateral hand pain    5. Bilateral wrist pain      Assessment and Plan  Attending: [name] is a 63 y.o. 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 [facility] diffuse arthralgias and abdominal pain related to ostomy output.  He was first seen in April 2023.    The patient was referred to our clinic by General Surgery for reported significant LLQ abdominal pain near surgery site and continued high ostomy output but no other concerns after surgery.      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 His worst pain is related to rectal spasm and polyarthralgias, worst in the wrists (L > R) and bilateral hands. He did have XRAYs of the hands at [facility] in 2022 that were unremarkable.  He was started on lyrica  due to initial concern for fibromyalgia and more neuropathic pain and is tolerating that well. He was previously followed by rheumatology for workup of arthritis (negative serologies, unlikely inflammatory arthritis) and was on prednisone  and then methotrexate  (for UC prior to November surgery) but is no longer on those medications. He has done PT in the past and feels that he is as active (walking) as possible and benefits more from home exercises. Has been challenging to find a regimen that provides significant benefit.    October 2025  At last visit in July, the patient was started on desipramine  for pain and sleep starting at low dose.    He presents for regular follow up.     Peripheral neuropathy of hands   Chronic, ongoing.   Managed with hydrocodone  and pregabalin , effective without side effects. Desipramine  discontinued due to adverse effects. No new symptoms.  - Continue hydrocodone  5-325 mg oral daily as needed for pain.  - Continue pregabalin  225 mg oral twice a day.  - Refill current medications.  - Underwent EMG, which was normal  - Continue IFC unit for neuropathic hand pain as needed  - Self discontinued desipramine10 mg at night due to side effects    Polyarthritis of unknown etiology - Bilateral Wrist Pain   - Chronic.   - Continue following with Rheumatology, has not seen in some time    Chronic pain syndrome   - Continue hydrocodone -acetaminophen  5-325 mg daily PRN #30 for pain (Patient counseled on Tylenol  content. DNE 3000 mg/24 hrs), refilled   - Continue flexeril  5 mg at HS PRN, (avoids d/t drowsiness)  - Continue home PT exercises and activity (enjoys walking)    Left Elbow Pain    He follows with PCP. No complaint today  - Continue follow up with PCP  - Use OTC Voltaren gel along with switching with Lidocaine  cream  - can consider injections (lateral epicondyle injection) if pain does not improve     Urine toxicology: 06/05/23  Urine toxicology screen appropriate  Treatment agreement: 05/2023  COMM: 6 points on 11/17/2023   Last Opioid Change: Start butrans  10/2021, 01/2022-stop butrans , start tramadol  50 mg #20, 04/2022- Increase tramadol  to #30, 01/13/23 stopped Tramadol , start Norco  5-325.   Last EKG:  None  Previous Compliance Issues: None  Naloxone ordered: N/A  Total morphine  equivalents: 5  Benzodiazepine: No.  Pain Psychology: Consider referral in the future  [place] DOC: None  NCCSRS database was reviewed 11/17/23.      Future Considerations:  - Right lumbar MBBs  - Re-engaging Rheum for Wrist/hand pain  - Consider titrating up desipramine   - Consider small increase to hydrocodone  (to #45)  - Consider repeat wrist xrays   - Other medications that dont rely on full GI absorption    Return in about 3 months (around 02/17/2024).    Requested Prescriptions     Signed Prescriptions Disp Refills    pregabalin  (LYRICA ) 225 MG capsule 60 capsule 2     Sig: Take 1 capsule (225 mg total) by mouth two (2) times a day.    HYDROcodone -acetaminophen  (NORCO ) 5-325 mg per tablet 30 tablet 0     Sig: Take 1 tablet by mouth daily as needed for pain. Fill on or after: 12/10/23    HYDROcodone -acetaminophen  (NORCO ) 5-325 mg per tablet 30 tablet 0     Sig: Take 1 tablet by mouth daily as needed for pain. Fill on or after: 01/09/24    HYDROcodone -acetaminophen  (NORCO ) 5-325 mg per tablet 30 tablet 0     Sig: Take 1 tablet by mouth daily as needed for pain. Fill on or after: 02/08/24     No orders of the defined types were placed in this encounter.        HPI:  Kevin Tran is seen in consultation at the request of Kevin Rock Gunner, PA  For evaluation and recommendations regarding His chronic pain.   History of Present Illness  Kevin Tran is a 63 year old male who presents for follow-up of chronic lower quadrant abdominal pain.    He reports pain unchanged since last visit and is on a regimen of hydrocodone  once daily and Lyrica  225 mg twice daily, both of which provide benefit without side effects. He also uses an IFC unit for neuropathic pain management.    He was previously started on desipramine  10 mg at night, but discontinued it after one or two days due to side effects including nausea and vomiting. He reports reading about the potential side effects and did not want to take that medication any ways but he was hoping for some benefit from it.    In August, he had an emergency visit due to a norovirus infection, which led to increased output from his ostomy. He received IV fluids and was discharged home. He continues to follow up with his GI, primary care, and urology teams.    No new or worsening symptoms at this time.     Current Medications Regime:  Lyrica  225 mg BID  Hydrocodone -acetaminophen  5-325 mg daily PRN  Tylenol  500 mg 4-6x/day  Voltaren gel  Lidocaine  cream    In regards to medications currently taken for pain management the patient is tolerating these medications well.  denies associated side effects: none  Patient denies misuse, abuse or diversion of medications.   Patient reports being stable on this medication regimen and thinks that the medications do improve patient's quality of life and do improve patient's functionality level.  Patient reports that the patient is able to perform majority of ADLs on the current regimen.     Patient denies homicidal/suicidal ideation.     Review of Systems:  See media tab    Previous Medication Trials: Diclofenac gel/voltaren gel, duloxetine /cymbalta  (side effects), gabapentin , methocarbamol /robaxin , Naproxen/Aleve, and Pregabalin /Lyrica , compounded topical (not helpful), butrans  (side effects), tramadol , hydrocodone , desipramine  (caused severe n/v)     Prior interventions include medications, PT.   Referral to neurology 10/2021  Pain psychology referral 01/2022    Prior imaging include MRI, XR, CT, Nerve Conduction Study.     The treatment goals include Complete resolution with medications if necessary, Ability to return to some type of employment, and Ability to return to previous daily routine and activity     Allergies as of 11/17/2023    (No Known Allergies)      Current Outpatient Medications   Medication Sig Dispense Refill    acetaminophen  (TYLENOL ) 500 MG tablet Take 2 tablets (1,000 mg total) by mouth every six (6) hours as needed for pain.      cyclobenzaprine  (FLEXERIL ) 5 MG tablet Take 1 tablet (5 mg total) by mouth nightly as needed for muscle spasms. 30 tablet 2    desipramine  (NORPRAMIN ) 10 MG tablet Take 1 tablet PO at night for 1-2 weeks.  If tolerated, increase to 2 tablets PO at night and continue. 60 tablet 2    folic acid  (FOLVITE ) 1 MG tablet TAKE 1 TABLET(1 MG) BY MOUTH DAILY 90 tablet 1    multivitamin (TAB-A-VITE/THERAGRAN) per tablet Take 1 tablet by mouth in the morning.      omeprazole  (PRILOSEC  OTC) 20 MG tablet Take 1 tablet (20 mg total) by mouth in the morning.      silodosin  (RAPAFLO ) 8 mg cap Take 1 capsule (8 mg total) by mouth in the morning. 90 capsule 3    tumeric-ging-olive-oreg-capryl 100 mg-150 mg- 50 mg-150 mg cap Take by mouth.      [START ON 12/10/2023] HYDROcodone -acetaminophen  (NORCO ) 5-325 mg per tablet Take 1 tablet by mouth daily as needed for pain. Fill on or after: 12/10/23 30 tablet 0    [START ON 01/09/2024] HYDROcodone -acetaminophen  (NORCO ) 5-325 mg per tablet Take 1 tablet by mouth daily as needed for pain. Fill on or after: 01/09/24 30 tablet 0    [START ON 02/08/2024] HYDROcodone -acetaminophen  (NORCO ) 5-325 mg per tablet Take 1 tablet by mouth daily as needed for pain. Fill on or after: 02/08/24 30 tablet 0    pregabalin  (LYRICA ) 225 MG capsule Take 1 capsule (225 mg total) by mouth two (2) times a day. 60 capsule 2     No current facility-administered medications for this visit.     OBJECTIVE  PHYSICAL EXAM:  BP 126/80  - Pulse 82  - Ht 172.7 cm (5' 8)  - Wt 76.7 kg (169 lb) Comment: PER PT - SpO2 98%  - BMI 25.70 kg/m??   Wt Readings from Last 3 Encounters:   11/17/23 76.7 kg (169 lb)   08/29/23 76.7 kg (169 lb)   06/05/23 76.5 kg (168 lb 9.6 oz)     GENERAL:  The patient is well developed, well-nourished, and appears to be in no apparent distress.   HEAD/NECK:    Normocephalic/atraumatic. clear sclera, pupils not pinpoint  CV:  Warm and well-perfused.  LUNGS:   Normal work of breathing, no supplemental O2  NEUROLOGIC:    The patient is alert and oriented, speech fluent, normal language.   MUSCULOSKELETAL:  Motor function  preserved.    GAIT:  The patient rises from a seated position with no difficulty and ambulates with nonantalgic gait without the assistance of a walking aid.   PSY:   Appropriate affect. No overt pain behaviors. No evidence of psychomotor retardation or agitation, no signs of intoxication.     We are delivering comprehensive, continuous, longitudinal care for this patient with chronic pain.

## 2023-12-10 MED ORDER — HYDROCODONE 5 MG-ACETAMINOPHEN 325 MG TABLET
ORAL_TABLET | Freq: Every day | ORAL | 0 refills | 30.00000 days | Status: CP | PRN
Start: 2023-12-10 — End: ?

## 2024-01-09 MED ORDER — HYDROCODONE 5 MG-ACETAMINOPHEN 325 MG TABLET
ORAL_TABLET | Freq: Every day | ORAL | 0 refills | 30.00000 days | Status: CP | PRN
Start: 2024-01-09 — End: ?

## 2024-02-08 MED ORDER — HYDROCODONE 5 MG-ACETAMINOPHEN 325 MG TABLET
ORAL_TABLET | Freq: Every day | ORAL | 0 refills | 30.00000 days | Status: CP | PRN
Start: 2024-02-08 — End: ?

## 2024-02-17 ENCOUNTER — Ambulatory Visit: Admit: 2024-02-17 | Discharge: 2024-02-18 | Payer: BLUE CROSS/BLUE SHIELD

## 2024-02-17 DIAGNOSIS — M2550 Pain in unspecified joint: Principal | ICD-10-CM

## 2024-02-17 DIAGNOSIS — M25531 Pain in right wrist: Principal | ICD-10-CM

## 2024-02-17 DIAGNOSIS — G609 Hereditary and idiopathic neuropathy, unspecified: Principal | ICD-10-CM

## 2024-02-17 DIAGNOSIS — M79642 Pain in left hand: Principal | ICD-10-CM

## 2024-02-17 DIAGNOSIS — M79641 Pain in right hand: Principal | ICD-10-CM

## 2024-02-17 DIAGNOSIS — G894 Chronic pain syndrome: Principal | ICD-10-CM

## 2024-02-17 DIAGNOSIS — M25532 Pain in left wrist: Principal | ICD-10-CM

## 2024-02-17 MED ORDER — NALOXONE 4 MG/ACTUATION NASAL SPRAY
NASAL | 0 refills | 0.00000 days | Status: CP
Start: 2024-02-17 — End: ?

## 2024-02-17 MED ORDER — PREGABALIN 225 MG CAPSULE
ORAL_CAPSULE | Freq: Two times a day (BID) | ORAL | 2 refills | 30.00000 days | Status: CP
Start: 2024-02-17 — End: ?

## 2024-02-17 NOTE — Progress Notes (Signed)
 Medication:HYDROcodone -acetaminophen  (NORCO ) 5-325 mg per tablet   DPH:Ujxz 1 tablet by mouth daily as needed for pain.   Quantity on RX: #30  Filled on:02/13/2024  Pill count today: #26

## 2024-02-17 NOTE — Progress Notes (Signed)
 Department of Anesthesiology  [facility]  [address], [address]  [place], [name] [address]  [phone]    Chronic Pain Follow Up Note    1. Chronic pain syndrome    2. Idiopathic peripheral neuropathy    3. Multiple joint pain    4. Bilateral hand pain    5. Bilateral wrist pain      Assessment and Plan  Attending: Yerick Tran is a 64 y.o. 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 [facility] for diffuse arthralgias and abdominal pain related to ostomy output.  He was first seen in April 2023.    The patient was referred to our clinic by General Surgery for reported significant LLQ abdominal pain near surgery site and continued high ostomy output but no other concerns after surgery.      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 His worst pain is related to rectal spasm and polyarthralgias, worst in the wrists (L > R) and bilateral hands. He did have XRAYs of the hands at [facility] in 2022 that were unremarkable.  He was started on lyrica  due to initial concern for fibromyalgia and more neuropathic pain and is tolerating that well. He was previously followed by rheumatology for workup of arthritis (negative serologies, unlikely inflammatory arthritis) and was on prednisone  and then methotrexate  (for UC prior to November surgery) but is no longer on those medications. He has done PT in the past and feels that he is as active (walking) as possible and benefits more from home exercises. Has been challenging to find a regimen that provides significant benefit.    January 2026  He presents for regular follow up.     Peripheral neuropathy of hands   Chronic, ongoing.   Managed with hydrocodone  and pregabalin , effective without side effects. Desipramine  discontinued due to adverse effects. No new symptoms.  - Continue hydrocodone  5-325 mg oral daily as needed for pain.  - Continue pregabalin  225 mg oral twice a day.  - Refill current medications.  - Continue IFC unit for neuropathic hand pain as needed  - Self discontinued desipramine10 mg at night due to side effects    Polyarthritis of unknown etiology - Bilateral Wrist Pain   - Chronic.   - Continue following with Rheumatology    Chronic pain syndrome   - Continue hydrocodone -acetaminophen  5-325 mg daily PRN #30 for pain (Patient counseled on Tylenol  content of Norco  . DNE 3000 mg/24 hrs), refilled   - Continue flexeril  5 mg at HS PRN, (avoids d/t drowsiness), he is interested in trying half dose 2.5 mg HS PRN   - Continue home PT exercises and activity (enjoys walking)    Urine toxicology: 06/05/23   Urine toxicology screen appropriate  Treatment agreement: 05/2023  COMM: 0 on 02/17/24  Last Opioid Change: Start butrans  10/2021, 01/2022-stop butrans , start tramadol  50 mg #20, 04/2022- Increase tramadol  to #30, 01/13/23 stopped Tramadol , start Norco  5-325.   Last EKG:  None  Previous Compliance Issues: None  Naloxone  ordered: N/A  Total morphine  equivalents: 5  Benzodiazepine: No.  Pain Psychology: Consider referral in  the future  [place] DOC: None  [place]CCSRS database was reviewed 02/17/2024.      Future Considerations:  - Right lumbar MBBs  - Re-engaging Rheum for Wrist/hand pain  - Consider small increase to hydrocodone  (to #45)  - Other medications that dont rely on full GI absorption    Return in about 3 months (around 05/17/2024).    Requested Prescriptions     Signed Prescriptions Disp Refills    pregabalin  (LYRICA ) 225 MG capsule 60 capsule 2     Sig: Take 1 capsule (225 mg total) by mouth two (2) times a day.    HYDROcodone -acetaminophen  (NORCO ) 5-325 mg per tablet 30 tablet 0     Sig: Take 1 tablet by mouth daily as needed for pain. Fill on or after: 03/14/24    HYDROcodone -acetaminophen  (NORCO ) 5-325 mg per tablet 30 tablet 0     Sig: Take 1 tablet by mouth daily as needed for pain. Fill on or after: 04/13/24    HYDROcodone -acetaminophen  (NORCO ) 5-325 mg per tablet 30 tablet 0     Sig: Take 1 tablet by mouth daily as needed for pain. Fill on or after: 05/13/24    naloxone  (NARCAN ) 4 mg nasal spray 1 each 0     Sig: One spray in either nostril once for known/suspected opioid overdose. May repeat every 2-3 minutes in alternating nostril til EMS arrives     No orders of the defined types were placed in this encounter.        HPI:  Kevin Tran is seen in consultation at the request of Donnie Rock Gunner, PA  For evaluation and recommendations regarding His chronic pain.     Kevin Tran is a 64 year old male who presents for regular chronic pain management follow-up.    His chronic pain remains stable and unchanged with the current medication regimen. He continues to take hydrocodone  5/325 mg daily as needed, which he takes every morning with his other medications, and pregabalin  225 mg twice a day. No side effects from these medications are reported, and they help him function and perform daily tasks.    He experiences difficulty staying asleep at night. Previously, he used cyclobenzaprine  at night to aid sleep but discontinued it due to morning drowsiness. He wakes up at night to empty his ostomy bag, which disrupts his sleep. He denies any new or worsening symptoms at this time.    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 He describes his family as supportive and talks to his wife about his condition.     Current Medications Regime:  Lyrica  225 mg BID  Hydrocodone -acetaminophen  5-325 mg daily PRN  Tylenol  500 mg 4x/day  Voltaren gel  Lidocaine  cream      Review of Systems:  Per HPI    Previous Medication Trials: Diclofenac gel/voltaren gel, duloxetine /cymbalta  (side effects), gabapentin , methocarbamol /robaxin , Naproxen/Aleve, and Pregabalin /Lyrica , compounded topical (not helpful), butrans  (side effects), tramadol , hydrocodone , desipramine  (caused severe n/v)     Prior interventions include medications, PT.   Referral to neurology 10/2021  Pain psychology referral 01/2022    Prior imaging include MRI, XR, CT, Nerve Conduction Study.     The treatment goals include Complete resolution with medications if necessary, Ability to return to some type of employment, and Ability to return to previous daily routine and activity     Allergies as of  02/17/2024    (No Known Allergies)      Current Outpatient Medications   Medication Sig Dispense Refill    acetaminophen  (TYLENOL ) 500 MG tablet Take 2 tablets (1,000 mg total) by mouth every six (6) hours as needed for pain.      cyclobenzaprine  (FLEXERIL ) 5 MG tablet Take 1 tablet (5 mg total) by mouth nightly as needed for muscle spasms. 30 tablet 2    desipramine  (NORPRAMIN ) 10 MG tablet Take 1 tablet PO at night for 1-2 weeks.  If tolerated, increase to 2 tablets PO at night and continue. 60 tablet 2    folic acid  (FOLVITE ) 1 MG tablet TAKE 1 TABLET(1 MG) BY MOUTH DAILY 90 tablet 1    HYDROcodone -acetaminophen  (NORCO ) 5-325 mg per tablet Take 1 tablet by mouth daily as needed for pain. Fill on or after: 12/10/23 30 tablet 0    HYDROcodone -acetaminophen  (NORCO ) 5-325 mg per tablet Take 1 tablet by mouth daily as needed for pain. Fill on or after: 01/09/24 30 tablet 0    HYDROcodone -acetaminophen  (NORCO ) 5-325 mg per tablet Take 1 tablet by mouth daily as needed for pain. Fill on or after: 02/08/24 30 tablet 0    multivitamin (TAB-A-VITE/THERAGRAN) per tablet Take 1 tablet by mouth in the morning.      omeprazole  (PRILOSEC  OTC) 20 MG tablet Take 1 tablet (20 mg total) by mouth in the morning.      pregabalin  (LYRICA ) 225 MG capsule Take 1 capsule (225 mg total) by mouth two (2) times a day. 60 capsule 2    silodosin  (RAPAFLO ) 8 mg cap Take 1 capsule (8 mg total) by mouth in the morning. 90 capsule 3    tumeric-ging-olive-oreg-capryl 100 mg-150 mg- 50 mg-150 mg cap Take by mouth.       No current facility-administered medications for this visit.       PHYSICAL EXAM:  BP 124/80 (BP Site: L Arm, BP Position: Sitting)  - Pulse 67  - Resp 16  - Ht 172.7 cm (5' 8)  - Wt 76.7 kg (169 lb) Comment: pt reports - SpO2 97%  - BMI 25.70 kg/m??   Wt Readings from Last 3 Encounters:   02/17/24 76.7 kg (169 lb)   11/17/23 76.7 kg (169 lb)   08/29/23 76.7 kg (169 lb)     GENERAL:  The patient is well developed, well-nourished, and appears to be in no apparent distress.   HEAD/NECK:    Normocephalic/atraumatic. clear sclera, pupils not pinpoint  CV:  Warm and well-perfused.  LUNGS:   Normal work of breathing, no supplemental O2  NEUROLOGIC:    The patient is alert and oriented, speech fluent, normal language.   MUSCULOSKELETAL:    Motor function  preserved.    GAIT:  The patient rises from a seated position with no difficulty and ambulates with nonantalgic gait without the assistance of a walking aid.   PSY:   Appropriate affect. No overt pain behaviors. No evidence of psychomotor retardation or agitation, no signs of intoxication.     We are delivering comprehensive, continuous, longitudinal care for this patient with chronic pain.

## 2024-02-17 NOTE — Patient Instructions (Signed)
 It was good to see you today.    I have refilled your medications with no changes.    We will see you in 3 months, or sooner if needed.

## 2024-02-17 NOTE — Progress Notes (Signed)
 Present during controlled medication pill count.  Correct count verified.

## 2024-03-14 MED ORDER — HYDROCODONE 5 MG-ACETAMINOPHEN 325 MG TABLET
ORAL_TABLET | Freq: Every day | ORAL | 0 refills | 30.00000 days | Status: CP | PRN
Start: 2024-03-14 — End: 2024-04-13

## 2024-03-16 ENCOUNTER — Emergency Department
Admit: 2024-03-16 | Discharge: 2024-03-16 | Disposition: A | Discharge status: Home / Self Care | Payer: BLUE CROSS/BLUE SHIELD

## 2024-03-16 DIAGNOSIS — R079 Chest pain, unspecified: Principal | ICD-10-CM

## 2024-03-16 LAB — COMPREHENSIVE METABOLIC PANEL
ALBUMIN: 4 g/dL (ref 3.4–5.0)
ALKALINE PHOSPHATASE: 80 U/L (ref 46–116)
ALT (SGPT): 23 U/L (ref 10–49)
ANION GAP: 14 mmol/L (ref 5–14)
AST (SGOT): 20 U/L (ref <=34)
BILIRUBIN TOTAL: 0.6 mg/dL (ref 0.3–1.2)
BLOOD UREA NITROGEN: 8 mg/dL — ABNORMAL LOW (ref 9–23)
BUN / CREAT RATIO: 10
CALCIUM: 10.1 mg/dL (ref 8.7–10.4)
CHLORIDE: 102 mmol/L (ref 98–107)
CO2: 26 mmol/L (ref 20.0–31.0)
CREATININE: 0.81 mg/dL (ref 0.73–1.18)
EGFR CKD-EPI (2021) MALE: >90 mL/min/{1.73_m2} (ref >=60)
GLUCOSE RANDOM: 100 mg/dL (ref 70–179)
POTASSIUM: 4.3 mmol/L (ref 3.4–4.8)
PROTEIN TOTAL: 7.4 g/dL (ref 5.7–8.2)
SODIUM: 142 mmol/L (ref 135–145)

## 2024-03-16 LAB — CBC W/ AUTO DIFF
BASOPHILS ABSOLUTE COUNT: 0.1 10*9/L (ref 0.0–0.1)
BASOPHILS RELATIVE PERCENT: 0.8 %
EOSINOPHILS ABSOLUTE COUNT: 0.2 10*9/L (ref 0.0–0.5)
EOSINOPHILS RELATIVE PERCENT: 2.5 %
HEMATOCRIT: 40 % (ref 39.0–48.0)
HEMOGLOBIN: 13.5 g/dL (ref 12.9–16.5)
LYMPHOCYTES ABSOLUTE COUNT: 1.7 10*9/L (ref 1.1–3.6)
LYMPHOCYTES RELATIVE PERCENT: 22.8 %
MEAN CORPUSCULAR HEMOGLOBIN CONC: 33.6 g/dL (ref 32.0–36.0)
MEAN CORPUSCULAR HEMOGLOBIN: 26.7 pg (ref 25.9–32.4)
MEAN CORPUSCULAR VOLUME: 79.3 fL (ref 77.6–95.7)
MEAN PLATELET VOLUME: 8.4 fL (ref 6.8–10.7)
MONOCYTES ABSOLUTE COUNT: 0.5 10*9/L (ref 0.3–0.8)
MONOCYTES RELATIVE PERCENT: 7.4 %
NEUTROPHILS ABSOLUTE COUNT: 4.8 10*9/L (ref 1.8–7.8)
NEUTROPHILS RELATIVE PERCENT: 66.5 %
NUCLEATED RED BLOOD CELLS: 0 /100{WBCs} (ref <=4)
PLATELET COUNT: 241 10*9/L (ref 150–450)
RED BLOOD CELL COUNT: 5.05 10*12/L (ref 4.26–5.60)
RED CELL DISTRIBUTION WIDTH: 13.1 % (ref 12.2–15.2)
WBC ADJUSTED: 7.3 10*9/L (ref 3.6–11.2)

## 2024-03-16 LAB — PRO-BNP: PRO-BNP: 53 pg/mL (ref <=300.0)

## 2024-03-16 LAB — HIGH SENSITIVITY TROPONIN I - 2 HOUR SERIAL
HIGH SENSITIVITY TROPONIN - DELTA (0-2H): 0 ng/L (ref <=7)
HIGH-SENSITIVITY TROPONIN I - 2 HOUR: 4 ng/L (ref <=53)

## 2024-03-16 LAB — HIGH SENSITIVITY TROPONIN I - SERIAL: HIGH SENSITIVITY TROPONIN I: 4 ng/L (ref <=53)

## 2024-03-16 LAB — PROTIME-INR
INR: 1.05
PROTIME: 12 s (ref 9.9–12.6)

## 2024-03-16 LAB — MAGNESIUM: MAGNESIUM: 1.8 mg/dL (ref 1.6–2.6)

## 2024-03-16 LAB — D-DIMER, QUANTITATIVE: D-DIMER QUANTITATIVE (ACL TOP): <215 ng{FEU}/mL (ref <=500)

## 2024-03-16 MED ADMIN — aspirin chewable tablet 324 mg: 324 mg | ORAL | @ 17:00:00 | Stop: 2024-03-16

## 2024-03-17 NOTE — Progress Notes (Signed)
 [facility] Cardiology [facility]  Emergency Department Chest Pain Transition Clinic    Patient ID: Kevin Tran is a 64 y.o. male with PMH significant for   arthritis, chronic pain syndrome, degenerative disc disease status post lumbar surgery 1996, history of R pneumothorax, ulcerative colitis now s/p TAC w/ end ileostomy 06/29/20 and attempted but failed colostomy reversal 12/12/20, and who is presenting to the chest pain transition clinic for evaluation of chest pain.    Assessment/Plan       Assessment & Plan  Atypical chest pain  Evaluation of chest pain and exertional dyspnea  Intermittent left-sided chest pain with exertional dyspnea.  Today he reports this CP and DOE has been ongoing for months, rather than 1 instance.  EKG without acute ST/T changes and cardiac enzymes normal in ED. Differential includes stress /anxiety, musculoskeletal pain, GI etiology, sequelae of recent fall, and potential coronary artery disease.  Risk factors include hyperlipidemia and a previous/remote history of smoking 1.5 ppd, quitting over 40 years ago.  - Ordered echocardiogram to r/o acute structural changes or reduced EF, WMA.  - Defer nuclear stress test given pt concern for insurance issues / cost concerns - we did discuss this test and explain what it is for in detail today.  - Advised to seek emergency care if chest pain worsens or if he experiences syncope.  - Follow-up in [place]    Suspected sleep apnea  Snoring reported, suggestive of possible obstructive sleep apnea. Discussed potential impact on cardiac health, including increased risk of heart failure and arrhythmias. CPAP therapy discussed if sleep apnea is confirmed.  - Recommended sleep study to evaluate for sleep apnea.  - Deferred sleep study until insurance and financial considerations are resolved.         The 10-year ASCVD risk score (Arnett DK, et al., 2019) is: 10.7%   The 10-year ASCVD risk score (Arnett DK, et al., 2019) is: 10.7%    Values used to calculate the score:      Age: 61 years      Clinically relevant sex: Male      Is Non-Hispanic African American: No      Diabetic: No      Tobacco smoker: No      Systolic Blood Pressure: 119 mmHg      Is BP treated: No      HDL Cholesterol: 48 mg/dL      Total Cholesterol: 199 mg/dL    Note: For patients with SBP <90 or >200, Total Cholesterol <130 or >320, HDL <20 or >100 which are outside of the allowable range, the calculator will use these upper or lower values to calculate the patient???s risk score.        Return in about 4 weeks (around 04/14/2024).      Subjective:   Referring:William C Bruno   ERE:Fjmxozb, [place], [place]   Chief complaint:  64 y.o. male with PMH significant for   arthritis, history of R pneumothorax, ulcerative colitis, who is referred by  Elsie JAYSON Beams for evaluation of chest pain.    History of Present Illness:      History of Present Illness  Kevin Tran is a 64 year old male who presents with left-sided chest pain and shortness of breath.    He has been experiencing intermittent left-sided chest pain and shortness of breath for several months. The chest pain varies in intensity and is sometimes accompanied by a sensation of pressure. A particularly severe episode occurred two  nights ago, accompanied by significant difficulty breathing and anxiety. The pain has persisted since then, though it is not as severe as during the initial episode. The pain does not change with movement or palpation and is present both at rest and with exertion. The patient reports that exertion makes the pain worse and rest makes it better.    Shortness of breath has been present for several months, occurring with minimal exertion, such as walking slightly further than to the bathroom or doing outdoor work. He reports a general lack of energy and attributes it to poor nutrition, stating 'the thing I eat goes straight to me.' No swelling in his legs or feet is noted, but he sometimes wakes up sweating at night. He occasionally uses Nyquil or Quviviq to aid sleep, though he finds muscle relaxers leave him groggy the next day.    He experienced a fall a week ago, landing flat on his back, which resulted in soreness from his neck down to his abdomen. He reports improvement in soreness over the past week.    He has a history of smoking, having quit approximately 40 years ago after smoking a pack and a half per day. He does not currently drink alcohol, though he occasionally drank beer in the past. He mentions a history of lung surgery (pneumothorax), which took a long time to recover from, but does not currently cause him issues.    He reports snoring, which his family describes as loud enough to be heard through walls. He is unsure if he has sleep apnea but acknowledges waking up sweating at night. He has not had a primary care provider recently due to insurance issues but plans to establish care once he qualifies for Medicare in June.         Heart Score:     History:    0 = Slightly Suspicious    EKG:       0 = Normal    Age:    57 = 74-57 years old    Risk Factors:    1 = 1-2 risk factors   Troponin:   0 = Normal limit or less   HEART Score Total: 2       EKG: NSR without acute ST/T changes    Troponins:  Lab Results   Component Value Date    TROPONINI 4 03/16/2024    TROPONINI 4 03/16/2024       Past Medical History  Problem List[1]    Medications:  Current Medications[2]    Allergies  Allergies[3]    Social History:   Short Social History[4]    Family History:  Family History[5]    ROS- 12 system review is negative other than what is specified in the History of Present Illness.      Objective:   Physical Exam  Vitals:    03/17/24 1058   BP: 119/74   BP Site: R Arm   BP Position: Sitting   Pulse: 66   SpO2: 99%   Weight: 73 kg (160 lb 14.4 oz)   Height: 175.3 cm (5' 9)       Physical Exam   Constitutional: No distress.   Neck: No JVD present.   Cardiovascular: Normal rate, regular rhythm and normal heart sounds. PMI is not displaced. Exam reveals no decreased pulses.   No murmur heard.  Pulmonary/Chest:   Reduced breath sounds bilaterally, worse on R and at the base   Abdominal: There is abdominal tenderness.  Musculoskeletal:         General: No edema.   Neurological: He is alert and oriented to person, place, and time.   Skin: Skin is warm and dry.        Laboratory data:  Lab Results   Component Value Date    WBC 7.3 03/16/2024    HGB 13.5 03/16/2024    HCT 40.0 03/16/2024    PLT 241 03/16/2024       Lab Results   Component Value Date    NA 142 03/16/2024    K 4.3 03/16/2024    CL 102 03/16/2024    CO2 26.0 03/16/2024    BUN 8 (L) 03/16/2024    CREATININE 0.81 03/16/2024    GLU 100 03/16/2024    CALCIUM  10.1 03/16/2024    MG 1.8 03/16/2024    PHOS 4.2 12/22/2020       Lab Results   Component Value Date    LDL 135 (H) 10/08/2018    HDL 48 10/08/2018    TRIG 78 10/08/2018    CHOL 199 10/08/2018       Lab Results   Component Value Date    PT 12.0 03/16/2024    INR 1.05 03/16/2024    APTT 32.3 11/24/2020            I have personally reviewed the results of the following diagnostic studies.    No previous cardiac studies      Ritta Ports, PA-C  March 17, 2024 4:19 PM         [1]   Patient Active Problem List  Diagnosis    Ulcerative colitis, acute, other complication    (CMS-HCC)    COVID-19    Exacerbation of ulcerative colitis without complication    (CMS-HCC)    Pain medication agreement signed    Small fiber neuropathy    Syrinx of spinal cord    (CMS-HCC)    Idiopathic peripheral neuropathy    Hearing loss   [2]   Current Outpatient Medications   Medication Sig Dispense Refill    acetaminophen  (TYLENOL ) 500 MG tablet Take 2 tablets (1,000 mg total) by mouth every six (6) hours as needed for pain.      cyclobenzaprine  (FLEXERIL ) 5 MG tablet Take 1 tablet (5 mg total) by mouth nightly as needed for muscle spasms. 30 tablet 2    HYDROcodone -acetaminophen  (NORCO ) 5-325 mg per tablet Take 1 tablet by mouth daily as needed for pain. Fill on or after: 03/14/24 30 tablet 0    [START ON 04/13/2024] HYDROcodone -acetaminophen  (NORCO ) 5-325 mg per tablet Take 1 tablet by mouth daily as needed for pain. Fill on or after: 04/13/24 30 tablet 0    [START ON 05/13/2024] HYDROcodone -acetaminophen  (NORCO ) 5-325 mg per tablet Take 1 tablet by mouth daily as needed for pain. Fill on or after: 05/13/24 30 tablet 0    multivitamin (TAB-A-VITE/THERAGRAN) per tablet Take 1 tablet by mouth in the morning.      naloxone  (NARCAN ) 4 mg nasal spray One spray in either nostril once for known/suspected opioid overdose. May repeat every 2-3 minutes in alternating nostril til EMS arrives 1 each 0    omeprazole  (PRILOSEC  OTC) 20 MG tablet Take 1 tablet (20 mg total) by mouth in the morning.      pregabalin  (LYRICA ) 225 MG capsule Take 1 capsule (225 mg total) by mouth two (2) times a day. 60 capsule 2    silodosin  (RAPAFLO ) 8 mg cap Take 1 capsule (  8 mg total) by mouth in the morning. 90 capsule 3    tumeric-ging-olive-oreg-capryl 100 mg-150 mg- 50 mg-150 mg cap Take by mouth.      desipramine  (NORPRAMIN ) 10 MG tablet Take 1 tablet PO at night for 1-2 weeks.  If tolerated, increase to 2 tablets PO at night and continue. 60 tablet 2    folic acid  (FOLVITE ) 1 MG tablet TAKE 1 TABLET(1 MG) BY MOUTH DAILY 90 tablet 1     No current facility-administered medications for this visit.   [3] No Known Allergies  [4]   Social History  Tobacco Use    Smoking status: Former     Current packs/day: 0.00     Types: Cigarettes     Quit date: 2006     Years since quitting: 20.1     Passive exposure: Never    Smokeless tobacco: Never   Vaping Use    Vaping status: Never Used   Substance Use Topics    Alcohol use: Yes    Drug use: No   [5]   Family History  Problem Relation Age of Onset    Cancer Mother         lung

## 2024-04-13 MED ORDER — HYDROCODONE 5 MG-ACETAMINOPHEN 325 MG TABLET
ORAL_TABLET | Freq: Every day | ORAL | 0 refills | 30.00000 days | Status: CP | PRN
Start: 2024-04-13 — End: 2024-05-13

## 2024-05-13 MED ORDER — HYDROCODONE 5 MG-ACETAMINOPHEN 325 MG TABLET
ORAL_TABLET | Freq: Every day | ORAL | 0 refills | 30.00000 days | Status: CP | PRN
Start: 2024-05-13 — End: 2024-06-12
